# Patient Record
Sex: Female | Born: 1940 | Race: White | Hispanic: No | Smoking: Never smoker
Health system: Southern US, Community
[De-identification: ages and names within clinical notes are randomized; demographics above are authoritative.]

## PROBLEM LIST (undated history)

## (undated) DIAGNOSIS — G4730 Sleep apnea, unspecified: Secondary | ICD-10-CM

## (undated) DIAGNOSIS — M1990 Unspecified osteoarthritis, unspecified site: Secondary | ICD-10-CM

## (undated) DIAGNOSIS — I471 Supraventricular tachycardia, unspecified: Secondary | ICD-10-CM

## (undated) DIAGNOSIS — F419 Anxiety disorder, unspecified: Secondary | ICD-10-CM

## (undated) DIAGNOSIS — R0600 Dyspnea, unspecified: Secondary | ICD-10-CM

## (undated) DIAGNOSIS — Z8489 Family history of other specified conditions: Secondary | ICD-10-CM

## (undated) DIAGNOSIS — K219 Gastro-esophageal reflux disease without esophagitis: Secondary | ICD-10-CM

## (undated) DIAGNOSIS — C8590 Non-Hodgkin lymphoma, unspecified, unspecified site: Secondary | ICD-10-CM

## (undated) DIAGNOSIS — F329 Major depressive disorder, single episode, unspecified: Secondary | ICD-10-CM

## (undated) DIAGNOSIS — E7800 Pure hypercholesterolemia, unspecified: Secondary | ICD-10-CM

## (undated) DIAGNOSIS — M549 Dorsalgia, unspecified: Secondary | ICD-10-CM

## (undated) DIAGNOSIS — E669 Obesity, unspecified: Secondary | ICD-10-CM

## (undated) DIAGNOSIS — K5909 Other constipation: Secondary | ICD-10-CM

## (undated) DIAGNOSIS — F32A Depression, unspecified: Secondary | ICD-10-CM

## (undated) DIAGNOSIS — I639 Cerebral infarction, unspecified: Secondary | ICD-10-CM

## (undated) DIAGNOSIS — I10 Essential (primary) hypertension: Secondary | ICD-10-CM

## (undated) HISTORY — DX: Depression, unspecified: F32.A

## (undated) HISTORY — DX: Dorsalgia, unspecified: M54.9

## (undated) HISTORY — PX: BONE BIOPSY: SHX375

## (undated) HISTORY — DX: Other constipation: K59.09

## (undated) HISTORY — DX: Supraventricular tachycardia, unspecified: I47.10

## (undated) HISTORY — PX: REPLACEMENT TOTAL KNEE: SUR1224

## (undated) HISTORY — DX: Sleep apnea, unspecified: G47.30

## (undated) HISTORY — DX: Major depressive disorder, single episode, unspecified: F32.9

## (undated) HISTORY — PX: ESOPHAGOGASTRODUODENOSCOPY: SHX1529

## (undated) HISTORY — DX: Obesity, unspecified: E66.9

## (undated) HISTORY — DX: Essential (primary) hypertension: I10

## (undated) HISTORY — DX: Supraventricular tachycardia: I47.1

## (undated) HISTORY — PX: EYE SURGERY: SHX253

## (undated) HISTORY — DX: Anxiety disorder, unspecified: F41.9

## (undated) HISTORY — DX: Cerebral infarction, unspecified: I63.9

## (undated) HISTORY — DX: Gastro-esophageal reflux disease without esophagitis: K21.9

## (undated) HISTORY — DX: Unspecified osteoarthritis, unspecified site: M19.90

## (undated) HISTORY — DX: Pure hypercholesterolemia, unspecified: E78.00

## (undated) SURGERY — ESOPHAGOGASTRODUODENOSCOPY (EGD) WITH PROPOFOL
Anesthesia: Monitor Anesthesia Care

---

## 1971-08-03 HISTORY — PX: TOTAL ABDOMINAL HYSTERECTOMY: SHX209

## 1999-05-11 ENCOUNTER — Encounter: Admission: RE | Admit: 1999-05-11 | Discharge: 1999-05-22 | Payer: Self-pay | Admitting: *Deleted

## 1999-11-19 ENCOUNTER — Other Ambulatory Visit: Admission: RE | Admit: 1999-11-19 | Discharge: 1999-11-19 | Payer: Self-pay | Admitting: Internal Medicine

## 1999-11-23 ENCOUNTER — Encounter: Admission: RE | Admit: 1999-11-23 | Discharge: 1999-11-23 | Payer: Self-pay | Admitting: *Deleted

## 1999-11-23 ENCOUNTER — Encounter: Payer: Self-pay | Admitting: *Deleted

## 2001-10-26 ENCOUNTER — Encounter: Payer: Self-pay | Admitting: Orthopedic Surgery

## 2001-11-01 ENCOUNTER — Inpatient Hospital Stay (HOSPITAL_COMMUNITY): Admission: RE | Admit: 2001-11-01 | Discharge: 2001-11-04 | Payer: Self-pay | Admitting: Orthopedic Surgery

## 2002-09-08 ENCOUNTER — Emergency Department (HOSPITAL_COMMUNITY): Admission: EM | Admit: 2002-09-08 | Discharge: 2002-09-08 | Payer: Self-pay | Admitting: *Deleted

## 2007-06-12 ENCOUNTER — Other Ambulatory Visit: Admission: RE | Admit: 2007-06-12 | Discharge: 2007-06-12 | Payer: Self-pay | Admitting: Family Medicine

## 2007-08-03 HISTORY — PX: BACK SURGERY: SHX140

## 2007-10-10 ENCOUNTER — Emergency Department (HOSPITAL_COMMUNITY): Admission: EM | Admit: 2007-10-10 | Discharge: 2007-10-11 | Payer: Self-pay | Admitting: Emergency Medicine

## 2007-10-26 ENCOUNTER — Encounter: Admission: RE | Admit: 2007-10-26 | Discharge: 2007-10-26 | Payer: Self-pay | Admitting: Family Medicine

## 2007-11-01 ENCOUNTER — Ambulatory Visit (HOSPITAL_COMMUNITY): Admission: RE | Admit: 2007-11-01 | Discharge: 2007-11-01 | Payer: Self-pay | Admitting: Family Medicine

## 2007-11-01 ENCOUNTER — Encounter (INDEPENDENT_AMBULATORY_CARE_PROVIDER_SITE_OTHER): Payer: Self-pay | Admitting: Family Medicine

## 2007-11-15 ENCOUNTER — Encounter: Payer: Self-pay | Admitting: Interventional Radiology

## 2007-12-04 ENCOUNTER — Ambulatory Visit (HOSPITAL_COMMUNITY): Admission: RE | Admit: 2007-12-04 | Discharge: 2007-12-04 | Payer: Self-pay | Admitting: Interventional Radiology

## 2007-12-08 ENCOUNTER — Ambulatory Visit (HOSPITAL_COMMUNITY): Admission: RE | Admit: 2007-12-08 | Discharge: 2007-12-08 | Payer: Self-pay | Admitting: Interventional Radiology

## 2007-12-28 ENCOUNTER — Encounter: Admission: RE | Admit: 2007-12-28 | Discharge: 2007-12-28 | Payer: Self-pay | Admitting: Interventional Radiology

## 2007-12-29 ENCOUNTER — Ambulatory Visit (HOSPITAL_COMMUNITY): Admission: RE | Admit: 2007-12-29 | Discharge: 2007-12-29 | Payer: Self-pay | Admitting: Interventional Radiology

## 2007-12-30 ENCOUNTER — Encounter (INDEPENDENT_AMBULATORY_CARE_PROVIDER_SITE_OTHER): Payer: Self-pay | Admitting: Interventional Radiology

## 2007-12-30 ENCOUNTER — Ambulatory Visit: Payer: Self-pay | Admitting: Internal Medicine

## 2007-12-30 ENCOUNTER — Inpatient Hospital Stay (HOSPITAL_COMMUNITY): Admission: AD | Admit: 2007-12-30 | Discharge: 2008-01-02 | Payer: Self-pay | Admitting: Interventional Radiology

## 2008-01-24 ENCOUNTER — Encounter: Admission: RE | Admit: 2008-01-24 | Discharge: 2008-01-24 | Payer: Self-pay | Admitting: Interventional Radiology

## 2008-01-31 ENCOUNTER — Ambulatory Visit (HOSPITAL_COMMUNITY): Admission: RE | Admit: 2008-01-31 | Discharge: 2008-01-31 | Payer: Self-pay | Admitting: Interventional Radiology

## 2008-04-21 ENCOUNTER — Encounter: Admission: RE | Admit: 2008-04-21 | Discharge: 2008-04-21 | Payer: Self-pay | Admitting: Interventional Radiology

## 2008-04-22 ENCOUNTER — Encounter: Payer: Self-pay | Admitting: Interventional Radiology

## 2008-04-26 ENCOUNTER — Ambulatory Visit (HOSPITAL_COMMUNITY): Admission: RE | Admit: 2008-04-26 | Discharge: 2008-04-26 | Payer: Self-pay | Admitting: Interventional Radiology

## 2008-04-26 ENCOUNTER — Encounter (INDEPENDENT_AMBULATORY_CARE_PROVIDER_SITE_OTHER): Payer: Self-pay | Admitting: Interventional Radiology

## 2008-04-29 ENCOUNTER — Ambulatory Visit: Payer: Self-pay | Admitting: Infectious Diseases

## 2008-04-29 DIAGNOSIS — M899 Disorder of bone, unspecified: Secondary | ICD-10-CM | POA: Insufficient documentation

## 2008-04-29 DIAGNOSIS — M949 Disorder of cartilage, unspecified: Secondary | ICD-10-CM

## 2008-04-29 LAB — CONVERTED CEMR LAB
AST: 19 units/L (ref 0–37)
Albumin ELP: 46.4 % — ABNORMAL LOW (ref 55.8–66.1)
Albumin: 3.8 g/dL (ref 3.5–5.2)
Alkaline Phosphatase: 165 units/L — ABNORMAL HIGH (ref 39–117)
Alpha-2-Globulin: 13.7 % — ABNORMAL HIGH (ref 7.1–11.8)
BUN: 12 mg/dL (ref 6–23)
Basophils Absolute: 0.1 10*3/uL (ref 0.0–0.1)
Basophils Relative: 1 % (ref 0–1)
Beta Globulin: 6.9 % (ref 4.7–7.2)
Creatinine, Ser: 0.65 mg/dL (ref 0.40–1.20)
Eosinophils Absolute: 0.4 10*3/uL (ref 0.0–0.7)
Eosinophils Relative: 4 % (ref 0–5)
Glucose, Bld: 101 mg/dL — ABNORMAL HIGH (ref 70–99)
HCT: 43 % (ref 36.0–46.0)
Hemoglobin: 13 g/dL (ref 12.0–15.0)
MCHC: 30.2 g/dL (ref 30.0–36.0)
MCV: 90.5 fL (ref 78.0–100.0)
Monocytes Absolute: 0.9 10*3/uL (ref 0.1–1.0)
Monocytes Relative: 8 % (ref 3–12)
RBC: 4.75 M/uL (ref 3.87–5.11)
RDW: 13.3 % (ref 11.5–15.5)
Rhuematoid fact SerPl-aCnc: <20 intl units/mL (ref 0–20)
Total Protein, Serum Electrophoresis: 7.4 g/dL (ref 6.0–8.3)

## 2008-04-30 ENCOUNTER — Ambulatory Visit: Payer: Self-pay | Admitting: Infectious Diseases

## 2008-04-30 LAB — CONVERTED CEMR LAB
Albumin, U: DETECTED %
Alpha 1, Urine: DETECTED % — AB
Alpha 2, Urine: DETECTED % — AB
Free Kappa Lt Chains,Ur: 1.58 mg/dL — ABNORMAL HIGH (ref 0.04–1.51)

## 2008-05-07 ENCOUNTER — Ambulatory Visit: Payer: Self-pay | Admitting: Hematology

## 2008-05-10 ENCOUNTER — Encounter: Payer: Self-pay | Admitting: Oncology

## 2008-05-10 ENCOUNTER — Other Ambulatory Visit: Admission: RE | Admit: 2008-05-10 | Discharge: 2008-05-10 | Payer: Self-pay | Admitting: Oncology

## 2008-05-10 LAB — COMPREHENSIVE METABOLIC PANEL
AST: 18 U/L (ref 0–37)
Alkaline Phosphatase: 147 U/L — ABNORMAL HIGH (ref 39–117)
BUN: 15 mg/dL (ref 6–23)
Calcium: 9.2 mg/dL (ref 8.4–10.5)
Chloride: 98 mEq/L (ref 96–112)
Creatinine, Ser: 0.69 mg/dL (ref 0.40–1.20)

## 2008-05-10 LAB — CBC WITH DIFFERENTIAL/PLATELET
BASO%: 0.8 % (ref 0.0–2.0)
Basophils Absolute: 0.1 10*3/uL (ref 0.0–0.1)
EOS%: 4.8 % (ref 0.0–7.0)
HCT: 39.1 % (ref 34.8–46.6)
MCH: 29 pg (ref 26.0–34.0)
MCHC: 33.6 g/dL (ref 32.0–36.0)
MCV: 86.2 fL (ref 81.0–101.0)
MONO%: 5.9 % (ref 0.0–13.0)
NEUT%: 70.5 % (ref 39.6–76.8)
lymph#: 1.7 10*3/uL (ref 0.9–3.3)

## 2008-05-10 LAB — APTT: aPTT: 34 seconds (ref 24–37)

## 2008-05-10 LAB — MORPHOLOGY

## 2008-05-10 LAB — PROTHROMBIN TIME: Prothrombin Time: 12.9 seconds (ref 11.6–15.2)

## 2008-05-10 LAB — CHCC SMEAR

## 2008-05-16 ENCOUNTER — Ambulatory Visit (HOSPITAL_COMMUNITY): Admission: RE | Admit: 2008-05-16 | Discharge: 2008-05-16 | Payer: Self-pay | Admitting: Oncology

## 2008-05-21 ENCOUNTER — Ambulatory Visit (HOSPITAL_COMMUNITY): Admission: RE | Admit: 2008-05-21 | Discharge: 2008-05-21 | Payer: Self-pay | Admitting: Oncology

## 2008-05-23 ENCOUNTER — Telehealth: Payer: Self-pay | Admitting: Infectious Diseases

## 2008-05-27 ENCOUNTER — Ambulatory Visit (HOSPITAL_COMMUNITY): Admission: RE | Admit: 2008-05-27 | Discharge: 2008-05-27 | Payer: Self-pay | Admitting: Oncology

## 2008-05-28 ENCOUNTER — Encounter (INDEPENDENT_AMBULATORY_CARE_PROVIDER_SITE_OTHER): Payer: Self-pay | Admitting: Neurological Surgery

## 2008-05-28 ENCOUNTER — Inpatient Hospital Stay (HOSPITAL_COMMUNITY): Admission: RE | Admit: 2008-05-28 | Discharge: 2008-06-04 | Payer: Self-pay | Admitting: Neurological Surgery

## 2008-06-03 ENCOUNTER — Ambulatory Visit: Payer: Self-pay | Admitting: Physical Medicine & Rehabilitation

## 2008-06-04 ENCOUNTER — Ambulatory Visit: Payer: Self-pay | Admitting: Physical Medicine & Rehabilitation

## 2008-06-04 ENCOUNTER — Inpatient Hospital Stay (HOSPITAL_COMMUNITY)
Admission: RE | Admit: 2008-06-04 | Discharge: 2008-06-08 | Payer: Self-pay | Admitting: Physical Medicine & Rehabilitation

## 2008-06-08 ENCOUNTER — Inpatient Hospital Stay (HOSPITAL_COMMUNITY): Admission: EM | Admit: 2008-06-08 | Discharge: 2008-06-17 | Payer: Self-pay | Admitting: Internal Medicine

## 2008-06-10 ENCOUNTER — Encounter (INDEPENDENT_AMBULATORY_CARE_PROVIDER_SITE_OTHER): Payer: Self-pay | Admitting: Internal Medicine

## 2008-06-14 ENCOUNTER — Encounter (INDEPENDENT_AMBULATORY_CARE_PROVIDER_SITE_OTHER): Payer: Self-pay | Admitting: *Deleted

## 2008-06-17 ENCOUNTER — Inpatient Hospital Stay (HOSPITAL_COMMUNITY)
Admission: RE | Admit: 2008-06-17 | Discharge: 2008-06-24 | Payer: Self-pay | Admitting: Physical Medicine & Rehabilitation

## 2008-06-18 ENCOUNTER — Ambulatory Visit: Payer: Self-pay | Admitting: Oncology

## 2008-06-26 ENCOUNTER — Ambulatory Visit (HOSPITAL_COMMUNITY): Admission: RE | Admit: 2008-06-26 | Discharge: 2008-06-26 | Payer: Self-pay | Admitting: Oncology

## 2008-07-12 ENCOUNTER — Ambulatory Visit: Payer: Self-pay | Admitting: Oncology

## 2008-08-05 LAB — COMPREHENSIVE METABOLIC PANEL
AST: 14 U/L (ref 0–37)
Alkaline Phosphatase: 105 U/L (ref 39–117)
BUN: 12 mg/dL (ref 6–23)
Creatinine, Ser: 0.52 mg/dL (ref 0.40–1.20)
Total Bilirubin: 0.8 mg/dL (ref 0.3–1.2)

## 2008-08-05 LAB — CBC WITH DIFFERENTIAL/PLATELET
BASO%: 0.3 % (ref 0.0–2.0)
EOS%: 9.6 % — ABNORMAL HIGH (ref 0.0–7.0)
HCT: 32.5 % — ABNORMAL LOW (ref 34.8–46.6)
LYMPH%: 15.5 % (ref 14.0–48.0)
MCH: 27.6 pg (ref 26.0–34.0)
MCHC: 33.6 g/dL (ref 32.0–36.0)
NEUT%: 74.4 % (ref 39.6–76.8)
Platelets: 261 10*3/uL (ref 145–400)
lymph#: 0.9 10*3/uL (ref 0.9–3.3)

## 2008-08-26 ENCOUNTER — Ambulatory Visit: Payer: Self-pay | Admitting: Oncology

## 2008-08-28 LAB — CBC WITH DIFFERENTIAL/PLATELET
Basophils Absolute: 0 10*3/uL (ref 0.0–0.1)
EOS%: 1 % (ref 0.0–7.0)
HCT: 33.2 % — ABNORMAL LOW (ref 34.8–46.6)
HGB: 11.2 g/dL — ABNORMAL LOW (ref 11.6–15.9)
MCH: 28.9 pg (ref 26.0–34.0)
MCV: 85.3 fL (ref 81.0–101.0)
MONO%: 0.3 % (ref 0.0–13.0)
NEUT%: 79.5 % — ABNORMAL HIGH (ref 39.6–76.8)
lymph#: 1.1 10*3/uL (ref 0.9–3.3)

## 2008-08-28 LAB — COMPREHENSIVE METABOLIC PANEL
AST: 16 U/L (ref 0–37)
BUN: 17 mg/dL (ref 6–23)
Calcium: 9 mg/dL (ref 8.4–10.5)
Chloride: 101 mEq/L (ref 96–112)
Creatinine, Ser: 0.54 mg/dL (ref 0.40–1.20)
Glucose, Bld: 104 mg/dL — ABNORMAL HIGH (ref 70–99)

## 2008-08-28 LAB — MAGNESIUM: Magnesium: 1.9 mg/dL (ref 1.5–2.5)

## 2008-08-28 LAB — URIC ACID: Uric Acid, Serum: 5.9 mg/dL (ref 2.4–7.0)

## 2008-09-19 LAB — CBC WITH DIFFERENTIAL/PLATELET
Basophils Absolute: 0 10*3/uL (ref 0.0–0.1)
EOS%: 1.7 % (ref 0.0–7.0)
HCT: 39.4 % (ref 34.8–46.6)
HGB: 13.3 g/dL (ref 11.6–15.9)
MCH: 29.5 pg (ref 26.0–34.0)
MCV: 87.3 fL (ref 81.0–101.0)
MONO%: 0.1 % (ref 0.0–13.0)
NEUT%: 85.9 % — ABNORMAL HIGH (ref 39.6–76.8)

## 2008-09-19 LAB — URIC ACID: Uric Acid, Serum: 7.3 mg/dL — ABNORMAL HIGH (ref 2.4–7.0)

## 2008-09-19 LAB — COMPREHENSIVE METABOLIC PANEL
AST: 21 U/L (ref 0–37)
Alkaline Phosphatase: 87 U/L (ref 39–117)
BUN: 21 mg/dL (ref 6–23)
Calcium: 9.1 mg/dL (ref 8.4–10.5)
Chloride: 103 mEq/L (ref 96–112)
Creatinine, Ser: 0.56 mg/dL (ref 0.40–1.20)

## 2008-10-08 ENCOUNTER — Ambulatory Visit: Payer: Self-pay | Admitting: Oncology

## 2008-10-10 LAB — COMPREHENSIVE METABOLIC PANEL
ALT: 34 U/L (ref 0–35)
AST: 14 U/L (ref 0–37)
Alkaline Phosphatase: 82 U/L (ref 39–117)
Calcium: 8.9 mg/dL (ref 8.4–10.5)
Chloride: 104 mEq/L (ref 96–112)
Creatinine, Ser: 0.57 mg/dL (ref 0.40–1.20)
Total Bilirubin: 0.5 mg/dL (ref 0.3–1.2)

## 2008-10-10 LAB — CBC WITH DIFFERENTIAL/PLATELET
BASO%: 0 % (ref 0.0–2.0)
EOS%: 1.3 % (ref 0.0–7.0)
HCT: 33.8 % — ABNORMAL LOW (ref 34.8–46.6)
MCH: 30.2 pg (ref 25.1–34.0)
MCHC: 33.5 g/dL (ref 31.5–36.0)
NEUT%: 90.8 % — ABNORMAL HIGH (ref 38.4–76.8)
lymph#: 0.6 10*3/uL — ABNORMAL LOW (ref 0.9–3.3)

## 2008-11-18 LAB — CBC WITH DIFFERENTIAL/PLATELET
Eosinophils Absolute: 0.5 10*3/uL (ref 0.0–0.5)
MONO#: 0 10*3/uL — ABNORMAL LOW (ref 0.1–0.9)
NEUT#: 4.3 10*3/uL (ref 1.5–6.5)
RBC: 3.69 10*6/uL — ABNORMAL LOW (ref 3.70–5.45)
RDW: 14.7 % — ABNORMAL HIGH (ref 11.2–14.5)
WBC: 5.3 10*3/uL (ref 3.9–10.3)

## 2008-11-18 LAB — COMPREHENSIVE METABOLIC PANEL
Albumin: 3.5 g/dL (ref 3.5–5.2)
CO2: 27 mEq/L (ref 19–32)
Chloride: 104 mEq/L (ref 96–112)
Glucose, Bld: 156 mg/dL — ABNORMAL HIGH (ref 70–99)
Potassium: 3.6 mEq/L (ref 3.5–5.3)
Sodium: 142 mEq/L (ref 135–145)
Total Protein: 5.7 g/dL — ABNORMAL LOW (ref 6.0–8.3)

## 2008-11-18 LAB — URIC ACID: Uric Acid, Serum: 4.3 mg/dL (ref 2.4–7.0)

## 2008-11-18 LAB — MAGNESIUM: Magnesium: 1.8 mg/dL (ref 1.5–2.5)

## 2009-07-17 DIAGNOSIS — E785 Hyperlipidemia, unspecified: Secondary | ICD-10-CM | POA: Insufficient documentation

## 2009-07-17 DIAGNOSIS — J309 Allergic rhinitis, unspecified: Secondary | ICD-10-CM | POA: Insufficient documentation

## 2009-09-16 IMAGING — CR DG CHEST 1V PORT
1 series · 1 of 1 positions shown · non-contrast
Comparison: Chest x-ray of 06/04/2008

CLINICAL DATA: Shortness of breath, atrial fibrillation

PORTABLE CHEST - 1 VIEW

[AP]
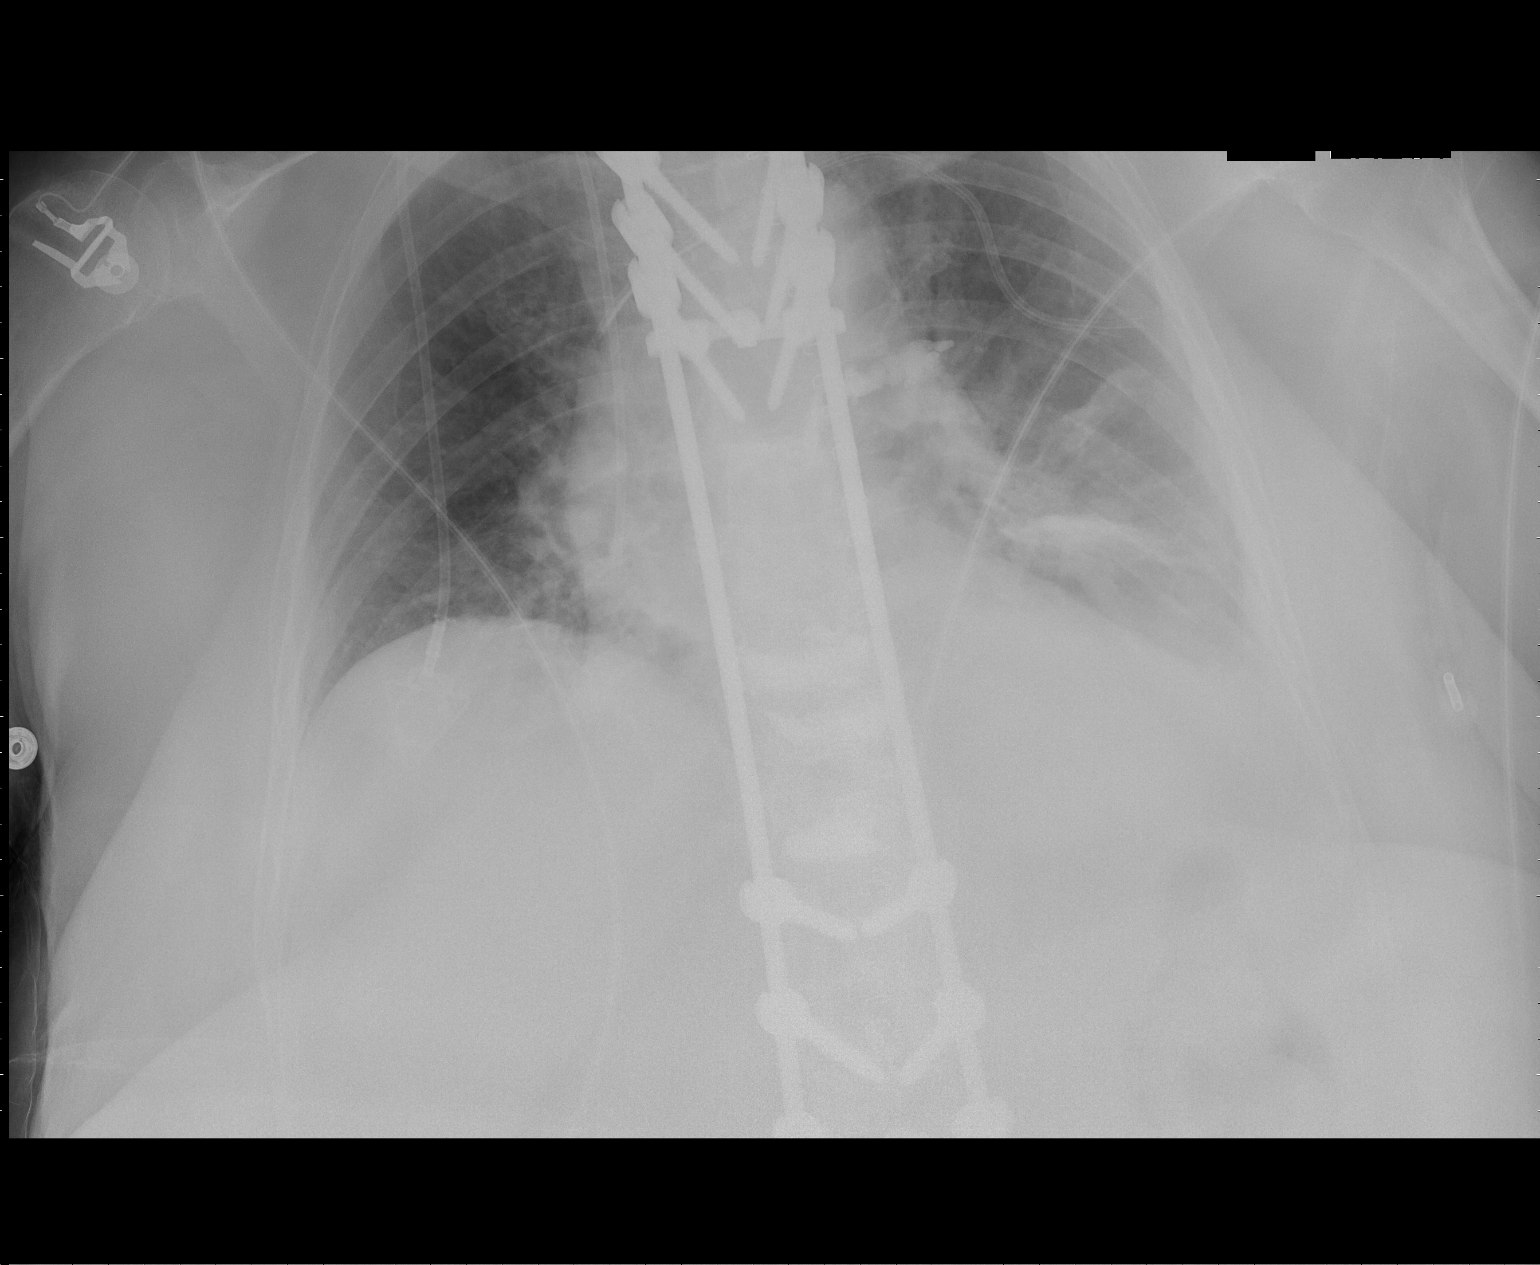

[1 of 1 positions shown; findings below may reference images not displayed]

FINDINGS: Linear atelectasis at the left lung base is stable.  The
right lung is clear.  Mild cardiomegaly is stable.  Central venous
lines are present with a new left central venous line now noted
with the tip in the mid SVC.  No pneumothorax is seen.  Harrington
rod fixation of the thoracic spine is present.
IMPRESSION: 1.  Stable linear atelectasis at the left lung base.
2.  New left central venous catheter tip inserted with tip in mid
SVC.  No pneumothorax.

## 2009-09-26 IMAGING — CR DG CHEST 2V
1 series · 1 of 1 positions shown · non-contrast
Comparison: 06/17/2008

CLINICAL DATA: Follow up infiltrate

CHEST - 2 VIEW

[w chest lat]
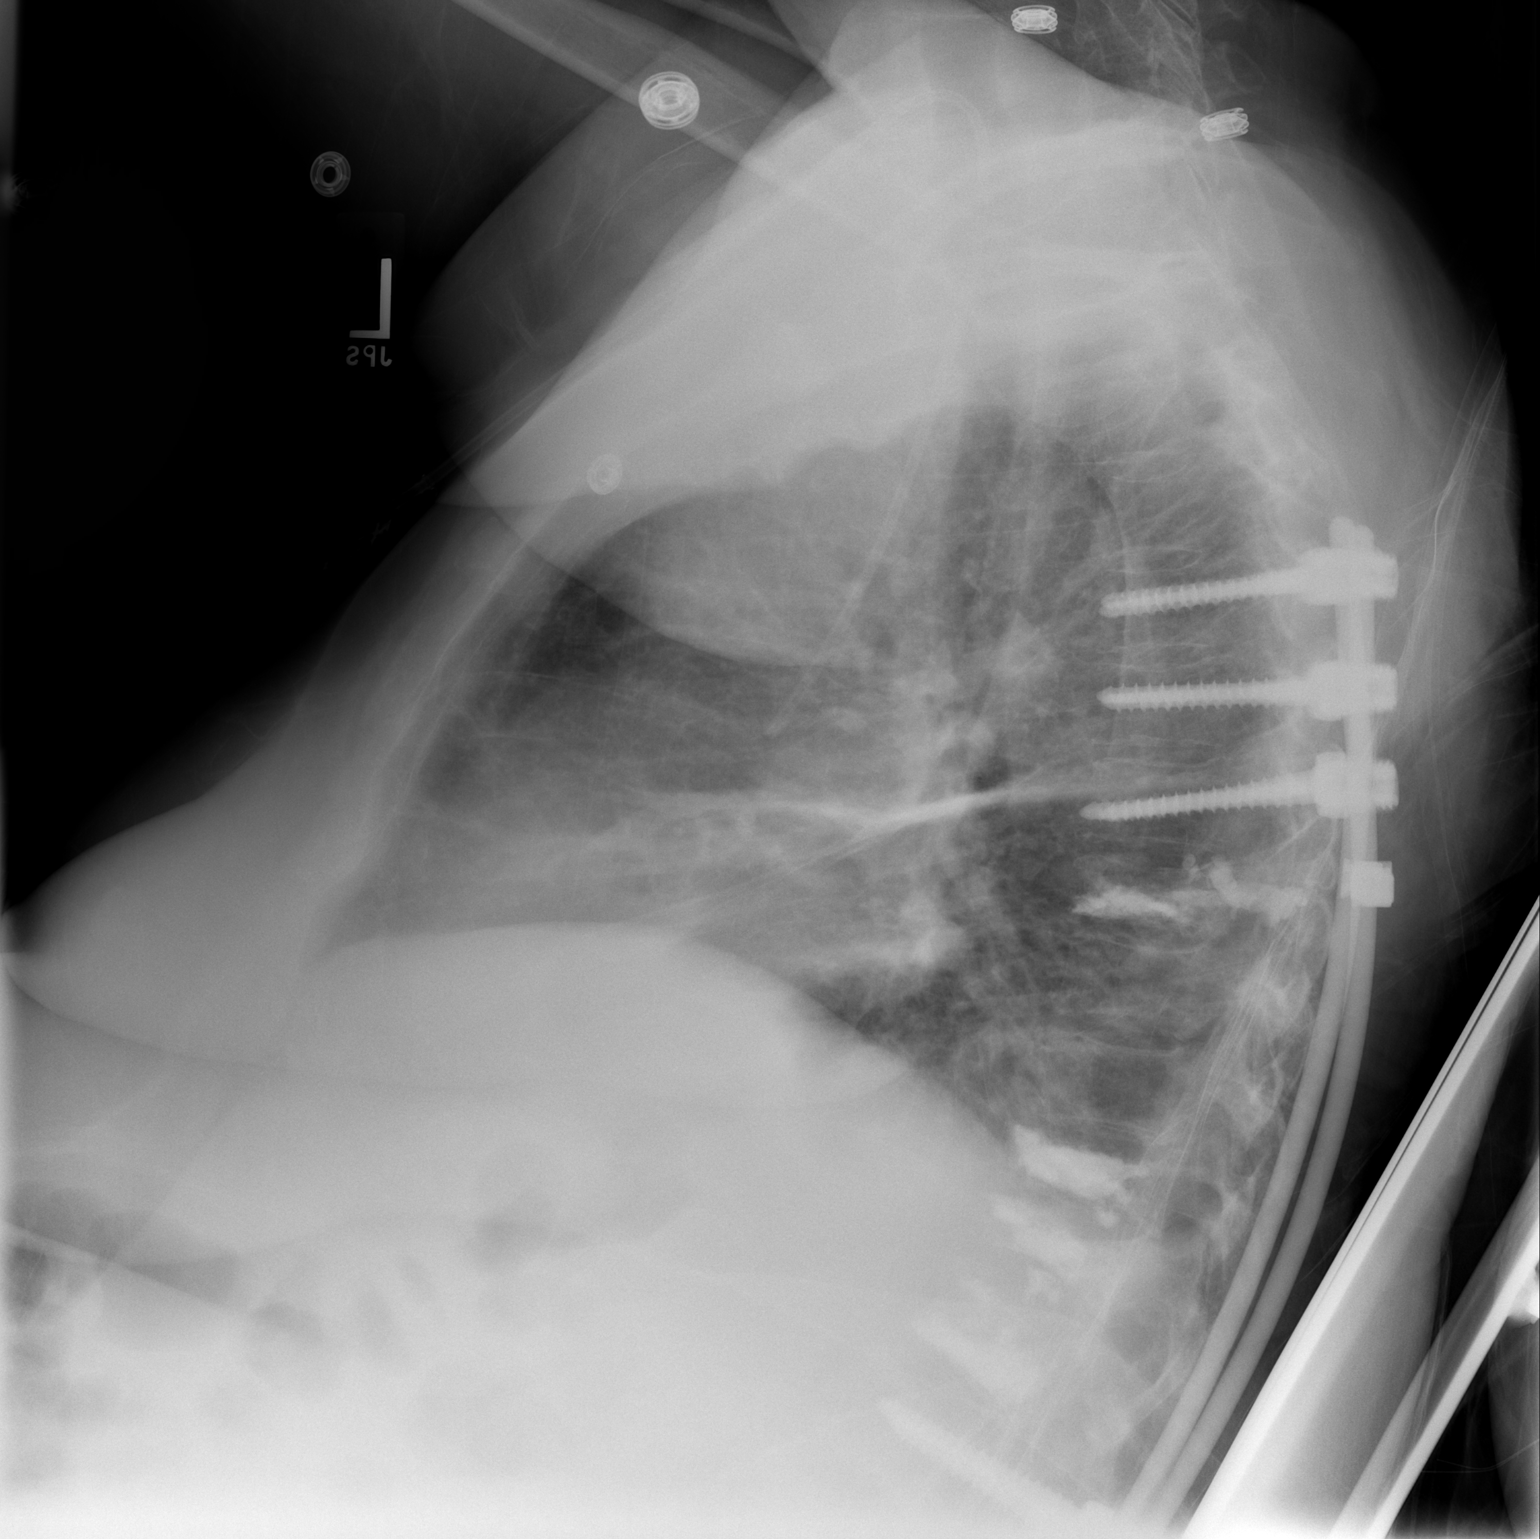

[1 of 1 positions shown; findings below may reference images not displayed]

FINDINGS: There is a right chest wall porta-catheter with tip in
the SVC.

Postoperative changes with thoracolumbar fusion hardware and
multiple vertebral plasty is again noted.

The left subclavian catheter has been removed.

There has been interval clearing of pulmonary edema

Persistent atelectasis and/or infiltrate within the lingular
portion of the left lung and left base.
IMPRESSION: 1.  Improved CHF.
2.  Residual densities at the left lung base.

## 2010-02-17 ENCOUNTER — Ambulatory Visit (HOSPITAL_COMMUNITY): Admission: RE | Admit: 2010-02-17 | Discharge: 2010-02-17 | Payer: Self-pay | Admitting: Gastroenterology

## 2010-05-04 ENCOUNTER — Ambulatory Visit (HOSPITAL_COMMUNITY): Admission: RE | Admit: 2010-05-04 | Discharge: 2010-05-04 | Payer: Self-pay | Admitting: Internal Medicine

## 2010-06-04 ENCOUNTER — Ambulatory Visit: Payer: Self-pay | Admitting: Cardiology

## 2010-07-20 DIAGNOSIS — I10 Essential (primary) hypertension: Secondary | ICD-10-CM | POA: Insufficient documentation

## 2010-08-23 ENCOUNTER — Encounter: Payer: Self-pay | Admitting: Family Medicine

## 2010-08-23 ENCOUNTER — Encounter: Payer: Self-pay | Admitting: Interventional Radiology

## 2010-10-15 LAB — CBC
HCT: 40.7 % (ref 36.0–46.0)
MCH: 30.9 pg (ref 26.0–34.0)
MCV: 90.4 fL (ref 78.0–100.0)
RBC: 4.5 MIL/uL (ref 3.87–5.11)
WBC: 7.6 10*3/uL (ref 4.0–10.5)

## 2010-11-14 ENCOUNTER — Other Ambulatory Visit: Payer: Self-pay | Admitting: Cardiology

## 2010-11-17 ENCOUNTER — Encounter: Payer: Self-pay | Admitting: Gastroenterology

## 2010-11-23 ENCOUNTER — Other Ambulatory Visit: Payer: Self-pay | Admitting: Cardiology

## 2010-11-23 DIAGNOSIS — I10 Essential (primary) hypertension: Secondary | ICD-10-CM

## 2010-11-25 ENCOUNTER — Telehealth: Payer: Self-pay | Admitting: *Deleted

## 2010-11-25 ENCOUNTER — Encounter: Payer: Self-pay | Admitting: Gastroenterology

## 2010-11-25 ENCOUNTER — Other Ambulatory Visit: Payer: Self-pay | Admitting: *Deleted

## 2010-11-26 NOTE — Telephone Encounter (Signed)
I have reviewed Echart, Centricity and EPIC and cannot locate any documentation that this pt has seen Dr Riley Kill.  The only thing I can locate is a Consult note from Dr Deborah Chalk in 2009.  This pt has not been seen in our office for an appointment.  We need to deny prescription and the pharmacy can forward to pt's PCP.

## 2010-12-15 NOTE — Discharge Summary (Signed)
[name], [name]                  ACCOUNT NO.:  [id number]   MEDICAL RECORD NO.:  [id number]          PATIENT TYPE:  IPS   LOCATION:  4007                         FACILITY:  [facility]   PHYSICIAN:  Ranelle Oyster, M.D.DATE OF BIRTH:  1941/04/03   DATE OF ADMISSION:  06/04/2008  DATE OF DISCHARGE:  06/08/2008                               DISCHARGE SUMMARY   DISCHARGE DIAGNOSES:  1. Tachycardia, question atrial fibrillation.  2. Hypertension.  3. 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  4. Chronic constipation issues.   HISTORY OF PRESENT ILLNESS:  [name] is 70 year old female with history  of hypertension, T10 compression fracture with kyphoplasty in April 2009  with worsening of pain and workup in May 2009 revealing interval  development of abnormality at T8-T12 with extension of compression  fractures to areas of T9 and T11 with question of tumor versus  infection.  Wound biopsies done, showed benign fibrosis of connective  tissue.  The patient developed pathological fracture of T7 with low back  pain and leg pain and weakness of left lower extremity.  X-rays done  recently showed T7-L1 involvement with pathological fracture at T1 and  T12 with cord compression secondary T7 fracture.  She has had CT abdomen  and chest done in the past that have shown borderline adenopathy in  periaortic and left peritoneal area with question of lymphoma.  Hem/Onc  is awaiting full biopsy results to decide on treatment option.  Currently, a Port-A-Cath is in place.   The patient was admitted on May 28, 2008, for bone biopsy of T7, T8,  T9, T11, and T12 with kyphoplasty at T7, T11, and T12 with decompression  at T7 via laminectomy and T4-L3 arthrodesis by Dr. Danielle Dess.  Bone biopsy  and flow cytometry without monoclonal B cell population or abnormal T  cell phenotype.  Currently, therapies are initiated and the patient is  noted to have issues with the pain  with dizziness being a limiting  factor.  She is able to do some standing.  Rehab was consulted for  further therapies.   Past medical history is significant for:  1. Hypertension.  2. Depression.  3. Chronic pain.  4. Chronic constipation.  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  6. A T10 compression fracture with kyphoplasty in April 2009 extension      to T9-T11 compression fracture in May 2009.  7. Gallstones.  8. Port-A-Cath placement, October 2009.  9. Asthma.  10.A history of pneumonia in September 2009.  11.Morbid obesity.   ALLERGIES:  No known drug allergies.   Family history is positive for cancer.   SOCIAL HISTORY:  The patient is married, lives in 1-level home with ramp  at entry.  Does not use any tobacco or alcohol.  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   FUNCTIONAL HISTORY:  The patient was independent with walker prior to  admission.  Has not been driving for the past few months.  Has needed  some assist p.r.n.  basis.   PHYSICAL EXAMINATION:  GENERAL:  [name] is an obese 70 year old  female, alert and oriented x3.  Appears to be in moderate distress due  to pain.  HEENT:  Pupils equal, round, and reactive to light.  Nares patent.  Tongue midline.  Moist mucosa.  Fair dentition.  NECK:  Supple without JVD or lymphadenopathy.  CHEST:  Clear to auscultation bilaterally with some scattered crackles  at bases.  HEART:  Regular and rate rhythm without murmurs or gallops.  ABDOMEN:  Soft with nonspecific tenderness.  Active bowel sounds noted.  EXTREMITIES:  No edema with left lower extremity weakness.  No cyanosis  or clubbing.  NEUROLOGIC:  Cranial nerves II-XII are intact.  Reflexes 2+.  She has  decreased sensation of bilateral legs.  Strength is improved in lower  extremities to 2/5 proximally, 3 plus to 4/5 distally.  Upper extremity  strength is 4-4 plus/5 with pain inhibition.  Reflexes are 1+.  Judgment   orientation, memory are within functional limits.  Mood is flat, a bit  anxious secondary to pain issues.   HOSPITAL COURSE:  [name] was admitted to rehab on June 04, 2008, for inpatient therapies to consist of PT and OT at least 3 hours 5  days a week.  Past admission, physiatry, rehab, RN, and therapy team  have worked together to provide customized collaborative  interdisciplinary care during her stay.  Secondary to question of left  lower lobe pneumonia, a followup chest x-ray was done past admission.  This revealed probable discoid left lung base atelectasis, less likely  an infection.  A KUB was also done secondary to her complaints of  nonspecific tenderness.  This showed normal bowel gas pattern and  cholelithiasis.  The patient with reports of constipation issues with no  BM for at least a week.  Aggressive bowel program was initiated by  nursing with addition of Senokot-S as well as Amitiza and a soapsuds  enema.  The patient did require disimpaction x2 to help resolution of  symptoms.  Additionally, the patient's blood pressures were monitored on  b.i.d. basis with orthostatic blood pressures ordered due to question of  dizziness with mobility.  The patient was noted to have some issues with  dizziness, specially with transfers to the edge of the bed.  Thigh-high  TEDs, as well as an abdominal binder, was ordered to help with the  patient's symptomatology.  Labs were done past admission revealing  hypokalemia with potassium at 3.3 and this was supplemented.  Her check  of CBC revealed hemoglobin 9.6, hematocrit 29.4, white count 10.6,  platelets 505.  UA/UC was done.  UA was positive; however, urine culture  showed 100,000 colonies of multi species.  Repeat check of lytes were  done to monitor the patient's hypokalemia.  Labs of June 07, 2008,  showed some improvement in potassium at 3.4, sodium at 140, chloride  103, CO2 28, BUN 5, creatinine 0.6, glucose 101.   The patient was  started back on her OxyContin CR at 10 mg b.i.d. at the time of  admission.  Pain has been a limiting factor, albeit increase in back  pain, especially with increase in mobility.  Her OxyContin CR was  increased to 20 mg q.12 h, OxyIR was additionally increased to 15 mg q.4-  6 h.  The patient had complaints of right shoulder trap pain.  Lidocaine  patch was added to this area to help with symptomatology.  At  the time  of admission, the patient was noted to require total assist for bed  mobility, required min assist for transfers, min assist for ambulating  20 feet with decreased cadence.  OT eval showed the patient had max  assist for bathing, total assist for lower body dressing, total assist  for donning TLSO.  OT has worked with nursing to help the patient get  premedicated prior to a.m. therapies to help improve her participation  in therapy.  OT has also worked with the patient in the use of assistive  equipment to help with lower body care, as well as emphasis of back  precautions with self-care.  The patient's therapy progress was limited  with the patient developing a tachycardia on June 08, 2008.  Heart  rate was noted to be up to 170s with the patient noted to be in AFib.  The patient was transferred off to telemetry for closer monitoring with  cardiac workup and/or other workup as needed.  On June 08, 2008, the  patient was discharged to acute services.   Medications at the time of discharge include:  1. Albuterol nebs q.i.d.  2. Symbicort 1 puff b.i.d.  3. Cymbalta 60 mg a day.  4. Hydrochlorothiazide 12.5 mg a day.  5. Prinivil 20 mg a day.  6. Protonix 40 mg a day.  7. Senokot-S 1 p.o. nightly.  8. Amitiza 24 mcg b.i.d.  9. Multivitamin 1 per day.  10.Valium 5 mg q.i.d. p.r.n. anxiety.  11.Os-Cal plus D b.i.d.  12.OxyContin CR 20 mg q.12 h.  13.OxyIR 15 mg q.4 h. p.r.n. pain.  14.Lidocaine patch to right shoulder blade, on 7:00 a.m., off  7:00      p.m.   Diet is regular, thin liquid.   Activity level, currently limited due tachycardia, further input  regarding activity and medication changes per primary team.      Greg Cutter, P.A.      Ranelle Oyster, M.D.  Electronically Signed    PP/MEDQ  D:  06/21/2008  T:  06/21/2008  Job:  [id number]   cc:   Stefani Dama, M.D.  Robert L. Foy Guadalajara, M.D.

## 2010-12-15 NOTE — Consult Note (Signed)
NAMEADDLEY, BALLINGER                  ACCOUNT NO.:  [id number]   MEDICAL RECORD NO.:  [id number]          PATIENT TYPE:  INP   LOCATION:  2918                         FACILITY:  [facility]   PHYSICIAN:  Colleen Can. Deborah Chalk, M.D.DATE OF BIRTH:  [birth year]   DATE OF CONSULTATION:  06/12/2008  DATE OF DISCHARGE:                                 CONSULTATION   Thank you much for asking [place] to see Ms. Heims for evaluation of abnormal  2D echocardiogram with questionable mass related to the mitral valve  leaflet.  She presents from rehab with a history of tachycardia and  intermittent atrial fib flutter.  She has had significant spinal surgery  including open bone biopsies and a questionable lymphoma with a  diagnosis.  She did well postop until June 08, 2008, when she  developed a tachycardia.  It spontaneously resolved.  Followup 2D  echocardiogram showed a questionable mass.   PAST MEDICAL HISTORY:  Morbid obesity, gallstones, history of asthma.  She has open bone biopsies on T7-8, T9-11, and T12 include kyphoplasty  at T7, T11, T12, and decompression of T7.  She has had depression,  anemia, hypertension, and chronic pain syndrome.   ALLERGIES:  None.   CURRENT MEDICATIONS:  Amitiza 24 mg b.i.d., Synacol, Cymbalta, Protonix,  lidocaine patch, multivitamin, Os-Cal, Lovenox, Xopenex, oxycodone,  Reglan, vancomycin, Zosyn, K-Dur, Colace, and IV Cardizem.   FAMILY HISTORY:  Mother died at 42 with breast cancer, lymphoma, and  DVTs.  Father died of a stroke.   SOCIAL HISTORY:  No tobacco.  No alcohol.   REVIEW OF SYSTEMS:  As noted.   PHYSICAL EXAMINATION:  GENERAL:  She is in no acute distress.  She  appeared be initially over sedated.  VITAL SIGNS:  Blood pressure is 117/56, heart rate is in the 70s to  100s, O2 sats 94%.  SKIN:  Warm and dry.  Color is normal.  LUNGS:  Few wheezes.  HEART:  Tachycardia.  ABDOMEN:  Obese.  EXTREMITIES:  Full.   PERTINENT LABS:  Hematocrit is 25,  platelets are 612,000, white count is  9000.  Chemistries showed potassium of 3.5, sodium 135, CO2 of 27, BUN  4, and creatinine 0.4.  Troponins are negative.  CK totals are low with  MB fraction of 9.8, 10.1, and 8.8 with questionable significant.  BNP is  84.  D-dimer is 19.89.  Chest x-ray shows edema versus pneumonia with  left lower lobe atelectasis.  EKG shows sinus tachycardia.  There is  questionable SVT versus atrial flutter with a 2:1.   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   OVERALL IMPRESSION:  1. Abnormal 2D echocardiogram.  2. Underlying malignancy.  3. History of hypertension.  4. Obesity.  5. Asthma.  6. Pneumonia.  7. Anemia.  8. Previous spinal surgery.   PLAN:  She will need a TEE for followup of this questionable mass.  We  will need to try to control her any atrial arrhythmias with a  combination of beta-blocker at a low dose to prevent exacerbation of  asthma and also the consideration of  digoxin.  I think that some of  issues will be related to underlying pathology.  Certainly, need to be  concerned about possibility of pulmonary emboli with her being sedentary  and not mobile.  She did have a CT angio of the chest on June 09, 2008, which showed no pulmonary emboli.      Colleen Can. Deborah Chalk, M.D.  Electronically Signed     SNT/MEDQ  D:  06/13/2008  T:  06/13/2008  Job:  [id number]

## 2010-12-15 NOTE — Consult Note (Signed)
[name], [name]                  ACCOUNT NO.:  [id number]   MEDICAL RECORD NO.:  [id number]           PATIENT TYPE:   LOCATION:                                 FACILITY:   PHYSICIAN:  Sanjeev K. Deveshwar, M.D.DATE OF BIRTH:  11-02-1940   DATE OF CONSULTATION:  01/31/2008  DATE OF DISCHARGE:                                 CONSULTATION   BRIEF HISTORY:  This is a very pleasant 70 year old female who was  admitted to [facility], Dec 30, 2007, to January 02, 2008, by Dr.  Rito Ehrlich for evaluation of back pain.  The patient does have a history  of compression fractures.  She had an MRI on October 26, 2007, that  revealed a compression fracture at T10 with an 80% loss of height.  On  November 01, 2007, she had a vertebroplasty performed by Dr. Corliss Skains at  the T10 level along with a deep core bone biopsy.  The biopsy results  showed benign bone and fibrous connective tissue.  There was no atypia  or monomorphic infiltrates identified.   The patient was seen in followup by Dr. Corliss Skains on November 15, 2007.  At  that time it was felt that her pain had improved significantly.  The  patient was told to contact us further, if she had any subsequent pain.   In May 2009, the patient developed further back pain.  She had a repeat  MRI of the thoracic and lumbar spine with and without contrast.  On Dec 04, 2007, this showed marked interval change with abnormality from T8-T12  with mild cord compression.  The lumbar MRI showed a tiny area of  enhancement in the right aspect of the thecal sac at the L2-L3 level.  The etiology or significance was indeterminate.  Further followup was  recommended.  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   The patient was seen again in followup by Dr. Corliss Skains on Dec 08, 2007,  she was having increased pain at that time.  Dr. Corliss Skains was not  certain  of the etiology of the changes from T8-T12.  He felt that this  possibly might be a reaction related to her previous vertebroplasty or  possibly some type of infectious process.  The patient was scheduled to  return for further evaluation in 2 weeks.  She was given a prescription  for Darvocet for pain at that time.   The patient ended up being admitted to [facility] on Dec 30, 2007, by Dr. Rito Ehrlich for further evaluation of her back pain which had  become quite severe and infectious disease consult was obtained from Dr.  Sampson Goon during that visit.  The etiology of the pain, any abnormality  on MRI was still unclear.  The patient was treated with OxyContin.  Cultures and pathology reports were still inconclusive.  Dr. Sampson Goon  felt that this was atypical for infection and thought that it possibly  might be an inflammatory response.  The patient was discharged from the  hospital on January 02, 2008.  She did have a portable chest x-ray on January 01, 2008, that showed no acute cardiopulmonary disease, although, there  was a probable left base scar with atelectasis and a more nodular  component inferior and laterally. Followup was recommended in 2-3 months  to confirm the stability.  On Dec 30, 2007, the patient underwent a  fluoro-guided needle biopsy at the T9-T11 levels performed by Dr.  Corliss Skains.  The T9 level showed necrosis and foci of crushed artifact.  The T11 level showed blood in small bone fragments associated with  crushed artifact.  There was no diagnostic malignancy identified,  although there was extensive necrosis along with crush artifact within  the connective tissue.   An MRI of the thoracic and lumbar spine was repeated on January 24, 2008,  that showed persistent lower thoracic abnormalities involving T8 and T12  with decreased epidural enhancement from T8-T10.  There is progression  of the posterior lateral paraspinal soft tissue abnormality,  particularly on  the right at the T12 to L1 level and on the left at T8.  Once again percutaneous sampling and biopsy of these lesions was to be  considered.  The patient was also noted to have a new left pleural  effusion and left lower lobe medial pulmonary process was felt this  could possibly represent pneumonia or atelectasis.  A followup was  recommended for this as well.  There is evolution of the T11 compression  fracture with mild retropulsion and mass effect on the spinal cord  similar to that previously seen at the T10 level.  The T10 level  appeared to be stable, status post vertebroplasty.  There was interval  resolve posterior epidural enhancement at T12 to L1 and the lumbar spine  otherwise appeared to be stable except for some increased posterior  lateral paraspinal soft tissue inflammation and enhancement included in  the left erector spinae muscle at L1.  This was contiguous with  abnormalities described in the thoracic impression above.   The patient returns today to be seen in followup by Dr. Corliss Skains.  She  is accompanied by her 3 daughters.  The patient reports that she has had  ongoing pain.  She has been taking OxyContin since being discharged from  the hospital for her pain which does provide some relief.  Dr. Corliss Skains  did review the results of the previous MRI studies with the patient and  her daughters.  He pointed out the areas of concern.  He did feel that  the most recent MRI was questionably improved, although he felt further  evaluation was indicated.   The patient has been scheduled for a CT scan of the chest today to  further evaluate a pleural effusion as noted above.  She recently had  labs at her primary care physician's office Dr. Foy Guadalajara, we had these  faxed over.  Her BUN 9.  Her creatinine was 0.7.  Her potassium was 4.8  and she will undergo CT scan of the chest with and without contrast  today for further evaluation of the left pleural effusion.  Dr.   Corliss Skains recommended repeating an MRI in 6 weeks'.  She may also need a  further biopsy.  The patient will be seen in followup by Dr. Corliss Skains  in 6 weeks' after her MRI as repeated.  He also recommended further  followup with Dr. Sampson Goon and continued followup with her primary  care  physician, Dr. Foy Guadalajara.   The patient has been taking OxyContin for pain.  She is almost out of  this prescription.  Dr. Corliss Skains did not feel comfortable giving her  new prescription.  The patient was told this medication could be very  habit-forming.  Dr. Corliss Skains recommended that she try to taper and  discontinue the medication.   PAST MEDICAL HISTORY:  Please see the above history regarding her spinal  abnormalities.  She also has a history of hypertension, history of  depression, history of chronic pain, and history of chronic  constipation.   ALLERGIES:  No known drug allergies.   CURRENT MEDICATIONS:  The patient did not bring a list of her  medications, although she is taking OxyContin for pain as noted.   SOCIAL HISTORY:  The patient is married.  She does not use alcohol or  tobacco.  She has never been a smoker, but she has been exposed to  secondhand smoke.  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  She and her  husband live in [place].   FAMILY HISTORY:  Her mother died at age 74 from lymphoma and breast  cancer.  Her father died at age 61 from arteriosclerosis.  She has no  siblings, 5 daughters are alive and well.   SURGICAL HISTORY:  The patient has had previous knee replacement  surgery.   PLAN:  As noted, the patient will have a CT scan of her chest today to  further evaluate her left pleural effusion.  She will follow up with Dr.  Corliss Skains with an MRI in 6 weeks' as well as a followup visit.  She may  need further biopsies.  Dr. Corliss Skains will discuss the case with Dr.  Sampson Goon.  Greater than 40 minutes was spent on this consult.      Delton See, P.A.     ______________________________  Grandville Silos. Corliss Skains, M.D.    DR/MEDQ  D:  01/31/2008  T:  02/01/2008  Job:  [id number]   cc:   Molly Maduro L. Foy Guadalajara, M.D.  Mick Sell, MD  Hollice Espy, M.D.

## 2010-12-15 NOTE — Op Note (Signed)
[name]                  ACCOUNT NO.:  [id number]   MEDICAL RECORD NO.:  [id number]          PATIENT TYPE:  INP   LOCATION:  3106                         FACILITY:  [facility]   PHYSICIAN:  Stefani Dama, M.D.  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   PROCEDURE:  Open bone biopsies of T7, T8, T9, T11, and C12, kyphoplasty,  acrylic balloon kyphoplasty of T7, T11, and T12, decompression of spinal  cord at T7 via laminectomy and transpedicular approach to the ventral  spine, segmental fixation at T4-L3 with posterior arthrodesis with  allograft.   SURGEON:  Stefani Dama, MD   FIRST ASSISTANT:  Clydene Fake, MD.   ANESTHESIA:  General endotracheal.   INDICATIONS:  [name] is a 70 year old individual who initially  presented with a pathologic fracture of T10.  Biopsy was nondiagnostic  at that time and she was observed and developed further disease with  subsequent involvement noted at T8-T9 above and T11-T12 below.  She then  developed a pathologic fracture of T7 and started to complain of back  pain and leg pain and developed weakness in the left lower extremity.  She now has involvement from T7 to L1 with new pathologic fractures of  T1 and T12 in addition to the cord compression that is present secondary  to the pathologic fracture at T7.  The patient is to undergo surgical  decompression and biopsies in addition to stabilization procedure.  The  patient was brought to the operating room supine on the stretcher.  After the placement of appropriate central monitoring lines and arterial  blood line and  Foley catheter, she was placed under general anesthesia  and turned prone.  Back was then prepped with alcohol and DuraPrep,  draped in sterile fashion.  A midline incision was carried down to the  thoracodorsal fascia, which was opened on either side of midline to  expose the thoracic spine in the middle area.  Ultimately, the  dissection was carried cephalad to expose T4 inferiorly to expose down  to L3.  Self-retaining retractors were placed in the wound.  The  dissection was done carefully to minimize blood loss.  At the level of  T7 where there was the worst area of compression, a transpedicular  needle was placed on the left side to obtain bone marrow biopsy and  there was a solid bone core from the T7 vertebra.  This was sent as a  singular specimen.  Subsequently, transpedicular approach was used to  biopsy T8 and T9, then T11 and T12.  Once the biopsies were obtained,  attention was turned to acrylic balloon kyphoplasty and this was  performed at T11-T12 where there was  a moderate degree of compression  and also at T7 on the ventral aspect of the vertebral body to gain some  anterior support.  The T7 kyphoplasty was noted to have some material  extruded self off to the left side.  This appeared to be in the  subcostal space.  At this point, it was decided to proceed with the  stabilization portion and pedicle entry sites were chosen  fluoroscopically at the T4, T5, and T6 vertebrae bilaterally placing a  5.5 x 45 or 50 mm screws in each of the vertebrae as required by length.  Each of the holes were sounded and checked for cutout and were  individually tapped.  Then attention was turned to the lower portion of  her fixation and pedicle screws were placed bilaterally at L1, L2, and  L3.  This was again done with fluoroscopic guidance using a  transpedicular approach and placing 6.5 x 45 or 6.5 x 50 mm screws into  the vertebrae as required per side.  Once these screws were all  placed,  then decompression was performed by removing the laminar arch of T7,  carefully exposing the dura on the left lateral side.  Transpedicular  approach to the ventral aspect of the vertebral body was performed, here  some bone was found to be bulging posteriorly.  This was tamped down and  several fragments were removed.  Nonetheless, the bulk of the vertebra  was allowed to remain intact.  Once decompression was obtained and  hemostasis was achieved, I then decorticated the lateral gutters and the  posterior arches from T4 down to L3.  Rods were then contoured to fit  from T4-L3 pedicle screws in capture of screws adequately to maintain a  neutral construct.  Rods were then cut, bent appropriately and fitted  and secured with the caps.  The system was then torqued down.  The  lateral gutters that had been decorticated were then filled with bone  marrow material in the form of Vitoss bone packs.  A total of 30 mL of  the Vitoss bone packs were used in the posterior lateral gutters.  Once  the area was secured, hemostasis was achieved and care was taken to make  sure that the dura was well decompressed and this area was inspected  thoroughly, then allowed for closure of the wound with #1 Vicryl in the  thoracodorsal and lumbodorsal fascia.  Three 2-0 Vicryl in the  subcutaneous tissues, 3-0 Vicryl subcuticularly and finally surgical  staples on the skin.  Procedure lasted a total of nearly 6 hours.  Blood  loss was estimated at 1000 mL.  The patient received 2 units of packed  cells in the operating room to be maintained in the intensive care unit  as transported there stable.      Stefani Dama, M.D.  Electronically Signed     HJE/MEDQ  D:  05/28/2008  T:  05/29/2008  Job:  [id number]

## 2010-12-15 NOTE — Consult Note (Signed)
[name], METZNER                  ACCOUNT NO.:  [id number]   MEDICAL RECORD NO.:  [id number]          PATIENT TYPE:  IPS   LOCATION:  4005                         FACILITY:  [facility]   PHYSICIAN:  Jethro Bolus, MD            DATE OF BIRTH:  Dec 05, 1940   DATE OF CONSULTATION:  06/18/2008  DATE OF DISCHARGE:                                 CONSULTATION   REFERRING PHYSICIAN:  Dr. Faith Rogue.   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 Burkitt's).   HPI:  [name] is a 70 year old Caucasian woman with history of low back  pain since April of 2009.  She underwent T10 compression fracture  vertebroplasty on November 01, 2007, with pathology consistent with benign  fibrous connective tissue without atypia or malignant infiltrate  identified.  Her back pain resolved until April 12, 2008, when her  lower back pain recurred.  She underwent a followup MRI on April 21, 2008, that showed edema in previously involved T8 to T12 levels.  There  was also new edema involving T7-L1 level with interval loss of body  height in levels T7-T12.  The old compression fracture on T10 appeared  to have progressed.   She underwent a fine-needle biopsy of L1 vertebral body on April 26, 2008, with pathology concerning for B-cell lymphoproliferative disorder  that was positive for CD79A, CD20, CD10 and negative for CD3, CD5, CD34,  CD43, and CD138.  She was referred to me for evaluation given concern  for B-cell lymphoproliferative process and underwent on May 10, 2008,  a bone marrow biopsy which showed no evidence of monoclonal B-cell  process.  She underwent a staging chest, abdomen, pelvic CT on May 16, 2008, which showed no gross changes in the appearance of the  thoracic spine or lumbar spine.  However, in the abdominal area, there  were 1.6 x 1.8cm gastrohepatic  ligament lymph node,  1.3 x 2.1cm celiac  lymph node, 1.3 x 1.6cm periaortic lymph node, and 0.9 x 1.2 cm  aortocaval lymph node.   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  She was evaluated by Dr. Barnett Abu with  neurosurgical service for decompressive procedure given the fact that  she was developing progressive debilitating lower back pain, bilateral  lower extremity weakness and paresthesia.  Therefore, she underwent on  May 28, 2008, open bone biopsy of T7, 8, 9, 11, and C12 and  kyphoplasty.  She also had acrylic balloon kyphoplasty of T7, T11, T12.  She had decompression of spinal cord at T7 via laminectomy and  transpedicular approach to the ventral spine with submental fixation at  T4 and L3 with posterior arthrodesis with allograft.  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  However, the rest of the other sample  specimens did not show disease in T6, T7, T8, T9, T11, and T12.  Immunohistochemistry was performed on the L1-L2 biopsy which was  positive for CD20, CD79A, CD10, and increase in Ki-67 and was negative  for T-cell marker, CD3, CD5,c -kit, myeloperoxidase and CD34 were  negative.   I received a formal report from the pathologist, Dr. Francoise Ceo, on  June 06, 2008, and given the concern for atypical presentation of the  patient I requested a [facility] Pathology consultation where I  received a call from Dr. Ricki Miller on June 17, 2008, relaying her concern  of morphologically Burkitt lymphoma.  However, the confirmatory FISH  study for c-myc translocation is pending.   INTERVAL HISTORY:  [name] has undergone  PT/OT after her surgery on  May 28, 2008.  However, this past week she had a few setbacks with  supraventricular tachycardia.  She also developed shortness of breath  with a CT angio negative for pulmonary embolism.  However, she was  diagnosed with bilateral bibasilar infiltrate concerning for either  community-acquired pneumonia or aspiration pneumonia.  Since then, she  has improved from the pulmonary standpoint and is on room air.  She is  seen in her room today with her 5 daughters and a son-in-law.  She  reports that she has been having moderate back pain where the  laminectomy took place.  However, she denies knowledge of any skin  breakdown or purulent discharge from her neurosurgical wound.  She  reports right shoulder pain with unknown etiology without any skin  breakdown.  From the rehab standpoint, she has been able to ambulate  with the use of a walker approximately 20 feet in addition to stand at a  sink and take shower herself.  She has anorexia and an unquantified  weight loss  for the past 2 months.  She denies night sweat, fever,  headaches, visual changes, palpable lymph nodes, swelling.  She has some  nausea without frank vomiting.  She denies cough, shortness of breath  now, hemoptysis, hematuria, hematemesis.  She does have some mild  shortness of breath upon ambulation.  She reports some mild bilateral  pelvic tenderness but it is intermittent.  She denies diarrhea,  constipation, hematuria.  She denies skin rashes or lower extremity bone  pain.   PAST MEDICAL HISTORY:  1. Hypertension.  2. History of asthma/reactive airway disease requiring home nebs      machine.  She denies history of asthma or COPD exacerbations      requiring home O2 or chronic inhalers or intubation in the past.  3. History of chronic constipation, chronic pain, and depression.  4. Supraventricular tachycardia as noted above.   She denies history of diabetes, hyperlipidemia, CAD, CVA,  CHF, seizure,  or clotting disorder.   CURRENT INPATIENT MEDICATIONS:  1. Augmentin 875 mg p.o. b.i.d.  2. Calcium with vitamin D.  3. Digoxin 0.125 mg p.o. daily.  4. Doxycycline 100 mg p.o. b.i.d.  5. Cymbalta 60 mg p.o. daily.  6. Iron sulfate 325 mg p.o. b.i.d.  7. Heparin subcu t.i.d.  8. Atrovent every 6 hours p.r.n.  9. Xopenex every 6 hours p.r.n.  10.Lisinopril 10 mg p.o. daily.  11.Lopressor 50 mg p.o. daily.  12.Pantoprazole 40 mg p.o. every 12 hours.  13.Oxycodone p.r.n. pain every 12 hours.   ALLERGIES:  NO KNOWN DRUG  ALLERGIES.   SOCIAL HISTORY:  Ms. Weyland is married and [place]  She denied history of smoking, alcohol, or  IV drug use.  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  Her 5 daughters have all been very supportive of Ms. Mccabe.   FAMILY HISTORY:  Notable for mother with unknown type non-Hodgkin  lymphoma.  Her mother also had a history of breast cancer when she was  34 years old.  Father deceased, age 44, from CAD and CVA.  She is an  only child.  She has 5 daughters who are all alive and well.  Maternal  grandmother had cervical cancer.  Maternal grandmother had cervical  cancer.  Paternal grandparents both had CVA.  Maternal aunt with breast  cancer in her 1s and another maternal aunt with unknown type of cancer.   PHYSICAL EXAM:  VITAL SIGNS:  Her temperature is 98.1 Fahrenheit.  Heart  rate of 88.  Respiratory rate of 18.  Blood pressure 150/80.  O2 sat of  93% on room air.  GENERAL:  Mildly obese woman, no acute distress, however, depressed  appearing.  Oropharynx was dry.  Mucous membranes without mucosal  lesion.  NECK:  Supple.  LYMPH NODE EXAM:  Negative for cervical, supraclavicular, axillary  adenopathy.  LUNGS:  Clear bilaterally without wheezing or crackle.  CARDIAC EXAM:  Regular rate and rhythm.  S1 and S2 without murmur, rub,  or gallop.  ABDOMEN:  Soft, flat,  nontender, nondistended without organomegaly.  There was no pedal edema.  MUSCULOSKELETAL EXAM:  Showed no palpable crepitus or skin rash or  tenderness of palpation of the right shoulder joint.  Her laminectomy  scar is well healed.   CURRENT LABORATORY:  WBC 8.8, ANC 74%, hemoglobin 9.5, MCV 86, platelet  count  582.  Creatinine 0.6, AST 19, ALT of 9, total protein 5.5, albumin 1.9,  calcium 8.6.   She did have SPEP and free light chains sent and showed no evidence of M-  spike or evidence of kappa lambda monoclonality during this admission.  Her iron studies showed iron of 15, percent sat of 160, folate 17,  ferritin of 670, B12 of 404.   DIAGNOSIS:  1. Multilevel vertebral compression fracture  2. 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 Decreased performance status 2/2 recent compression fracture and      recent surgery  4. Recent nosocomial pneumonia  5. Supraventricular tachycardia   ASSESSMENT AND PLAN:  It is my impression that Ms. Batul Diego is a 34-  year-old woman with no significant past medical history with progressive  vertebral spine compressive fracture with the last procedure done on  May 28, 2008.  Biopsy of L1-L2 lesions concerning for Burkitt  lymphoma morphologically.  At this time, her FISH analysis for  confirmatory c-myc translocation is still pending.  If she turns out to  have Burkitt's lymphoma, this would be extranodal Burkitt's lymphoma  with borderline retroperioteneal LAD on CT.  Staging has been notable  for negative bone marrow biopsy; LDH normal at 242 U/L (on 05/10/08).  She has portaCath in place; and EF 57% in 10/09.   I have discussed with Ms. Stiner and her daughters that Burkitt lymphoma  is an aggressive lymphoma that is normally not curable with traditional  chemotherapy using CHOP-Rituxan regiman.  She would require aggressive  chemotherapy regimen such as hyper-CVAD.  The final diagnosis will be  available over  the next few days.  At  this time, I encouraged patient to  increase her p.o. intake and physical therapy/occupational therapy  effort to prepare for any need for intensive chemotherapy in the near  future.  When her final FISH is resulted, I will contact the patient and  her family members to discuss my final recommendation of transferring to  a tertiary care center for aggressive chemotherapy for Burkitt if that  turns out to be the case.  On the other hand, if she turns out to have  diffuse large B-cell lymphoma or another aggressive lymphoma, not  otherwise specified, then I will recommend patient undergoing aggressive  physical therapy/occupational therapy and will be started upon  chemotherapy upon discharge.   The patient and her family members expressing informed consenting and  will let me know in the next few days where they would like the patient  to be transferred to if she has Burkitt lymphoma.      Jethro Bolus, MD  Electronically Signed     HH/MEDQ  D:  06/18/2008  T:  06/18/2008  Job:  [id number]

## 2010-12-15 NOTE — H&P (Signed)
[name], [name]                  ACCOUNT NO.:  [id number]   MEDICAL RECORD NO.:  [id number]          PATIENT TYPE:  IPS   LOCATION:  NA                           FACILITY:  [facility]   PHYSICIAN:  Ellwood Dense, M.D.   DATE OF BIRTH:  06-22-1941   DATE OF ADMISSION:  06/17/2008  DATE OF DISCHARGE:                              HISTORY & PHYSICAL   PRIMARY CARE PHYSICIAN:  Dr. Foy Guadalajara.   NEUROSURGEON:  Dr. Danielle Dess.   ONCOLOGIST:  Dr. Gaylyn Rong.   CARDIOLOGIST:  Dr. Deborah Chalk.   HISTORY OF PRESENT ILLNESS:  Ms. Kotas is a 70 year old Caucasian female  with history of hypertension and multiple compression fractures with  prior vertebroplasty and needle biopsies without evidence of malignancy.   The patient continued to experience back pain and MRI scan showed  interval development of abnormalities at T8-T12 with extension of  compression fracture T8-T11 with questionable tumor versus infection.  She developed a pathological fracture at T7 with low back pain and  weakness of the bilateral lower extremities.  X-rays recently showed a  pathological fracture at T1 and T12 with cord compression at T7  secondary to fracture.   The patient was admitted May 28, 2008, for bone biopsy at T7, T8,  T9, T11 and T12 with kyphoplasty at T7, T11 and T12.  She also had a  decompression of the cord at T7 with fixation T4-L3 by Dr. Danielle Dess.  The  patient had history of abdominal and chest CT which showed borderline  adenopathy and Dr. Gaylyn Rong has been following.  Pathology from surgery showed  non-Hodgkin's B-cell lymphoma.   The patient had issues regarding the hypertension and pain management.  She initially came to the rehabilitation June 04, 2008, for ongoing  therapy.  She was participating in therapies, but on June 09, 2008,  the patient developed tachycardia with heart rate in the 170s secondary  to supraventricular tachycardia/flutter.  She was transferred to step-  down for closer monitoring and  treatment.  CT angiography of the chest  showed no pulmonary embolism, although there was bilateral lower lobe  atelectasis as well as right middle lobe and lingula atelectasis.  She  was started on IV antibiotics for leukocytosis and pneumonia June 10, 2008.  A 2-D echocardiogram showed questionable mass and a  transesophageal echocardiogram June 14, 2008, showed ejection  fraction of 60% with increased aortic valve thickness without mass or  vegetation.  Dr. Deborah Chalk has been following, and a beta blocker was  added for control of heart wave with conversion from atrial fibrillation  to normal sinus rhythm.  Therapies have been reinitiated, and the  patient has problems with bed mobility and ambulation.  She is being  transferred back to rehab for continued therapies at this point.   The patient was evaluated by the rehabilitation physicians and felt be  an appropriate candidate for inpatient rehabilitation.   REVIEW OF SYSTEMS:  Positive for weakness and wound healing.   PAST MEDICAL HISTORY:  1. Hypertension.  2. Depression.  3. Chronic pain.  4. Constipation.  5. Obesity.  6. Gallstones.  7. Port-A-Cath.   FAMILY HISTORY:  Positive for cancer.   SOCIAL HISTORY:  The patient is married and lives with her husband in a  one level home with no steps to enter.  Husband is reportedly in good  health.  There are five daughters who mostly all live close by and can  help as needed.  The patient does not use alcohol or tobacco.   FUNCTIONAL HISTORY:  Prior to admission, independent with a rolling  walker.   ALLERGIES:  NO KNOWN DRUG ALLERGIES.   MEDICATIONS:  Prior to admission:  1. Prilosec over-the-counter 20 mg daily.  2. Diazepam 5 mg daily.  3. Celebrex 200 mg daily (Prinizide 20/12.51 one tablet daily).  4. Evista 60 mg daily.  5. Amitiza 24 mcg b.i.d.  6. Os-Cal with vitamin D daily.  7. Multivitamin daily.  8. OxyContin CR 10 mg q.12 h.   LABORATORY:  Recent  hemoglobin was 13.6 with hematocrit of 41.9,  platelet count of 976,000 and white count of 12.5.  Recent sodium was  145, potassium 3.5, chloride 104, bicarbonate 33, BUN 2 and creatinine  0.58 with glucose of 90.  BNP was 84, measured June 12, 2008.  Hemoglobin A1c was 5.7.  Chest x-ray June 17, 2008, showed bilateral  lung densities improved but not yet resolved.  Recent reticulocyte count  was 1.9% with iron of 15, TIBC of 160 and vitamin B12 404.   PHYSICAL EXAMINATION:  GENERAL:  Morbidly obese adult female lying in  bed in mild to no acute discomfort.  VITAL SIGNS:  Blood pressure 153/78 with pulse 78, straight 18 and  temperature 98.1.  HEENT:  Normocephalic, nontraumatic.  CARDIOVASCULAR:  Regular rate and rhythm.  S1-S2 without murmurs.  ABDOMEN:  Soft, obese, nontender with positive bowel sounds.  LUNGS:  Clear to auscultation bilaterally.  NEUROLOGIC:  Alert and oriented x3.  Cranial nerves II-XII are intact.  Tongue was in the midline and facial symmetry was intact.  BACK:  Showed  well-healing wound without significant drainage.  Bilateral upper  extremity exam showed 4/5 strength throughout.  Sensation was intact to  light touch throughout the bilateral upper extremities.  Sensation was  intact to light touch throughout the bilateral upper extremities.  Lower  extremity exam showed hip flexion, knee extension and ankle dorsiflexion  at 4-/5 bilaterally.   DIAGNOSES:  1. 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  Status post T7, T11 an T12      kyphoplasty with decompression at T7 and fixation T4-L3 May 28, 2008, by Dr. Danielle Dess.  2. Lung atelectasis on antibiotic Dec 06, 2012 with recheck of chest x-      ray.  3. Atrial fibrillation presently on Lanoxin and beta-blocker for heart      rate control, presently in normal sinus rhythm.  4. Acute blood loss anemia with ongoing iron deficiency.  5.  Reported diarrhea/stools presently on Amitiza.  6. Pain control, presently on OxyContin CR 10 mg q.12 h with p.r.n.      Vicodin for breakthrough pain.  7. Non-Hodgkin's lymphoma awaiting input and Dr. Gaylyn Rong.  8. Hypertension, presently on Prinivil at a low dose.   Presently, the patient will be admitted to receive collaborative and  interdisciplinary care between the physiatrist, rehab nursing staff and  therapy team at least 3 hours per day at least 5 days per  week.  The  patient's level of medical complexity and substantial therapy needs in  context that medical necessity cannot be provided at a lesser intensity  of care.  Physiatrist will provide 24-hour management of medical needs  as well as oversight of the therapy plans/treatment and provide guidance  as appropriate regarding interaction of the two.  A 24-hour rehab  nursing will assist in management of bowel and bladder and help  integrate therapy concepts, techniques and education.  Physical therapy  will assess and treat for range of motion, strengthening, bed mobility,  transfers, pregait training, gait training and equipment evaluation.  Occupational therapy will assess and treat for range of motion,  strengthening, ADLs, cognitive slash perceptional training, splinting  and equipment evaluation.  Case management social work will assess and  treat for psychosocial issues and discharge planning as appropriate.  Team conferences will be held weekly to establish goals, assess progress  and determine barriers to discharge.   PROGNOSIS:  Fair/good.   ESTIMATED LENGTH OF STAY:  Ten to 20 days.   GOALS:  Standby assist to min assist ADLs, transfers and ambulation.           ______________________________  Ellwood Dense, M.D.     DC/MEDQ  D:  06/17/2008  T:  06/17/2008  Job:  [id number]

## 2010-12-15 NOTE — H&P (Signed)
NAMELESLEA, VOWLES                  ACCOUNT NO.:  [id number]   MEDICAL RECORD NO.:  [id number]          PATIENT TYPE:  INP   LOCATION:  2314                         FACILITY:  [facility]   PHYSICIAN:  Kela Millin, M.D.DATE OF BIRTH:  06-08-1941   DATE OF ADMISSION:  06/08/2008  DATE OF DISCHARGE:                              HISTORY & PHYSICAL   CHIEF COMPLAINT:  Tachycardia to 170s per rehab physician.   HISTORY OF PRESENT ILLNESS:  The patient is a 70 year old obese white  female who was discharged from acute care to the [facility] inpatient rehab  unit on June 04, 2008 following surgery per Dr. 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  Per rehab physician the patient has done well since  transfer to the 4000 unit on June 04, 2008 still today when nursing  staff reported that she was having tachycardia up to the 170s.  The  patient denies chest pain and states that she is not so sure why she had  to be transferred.  She admits to dyspnea on exertion but states that  she has had that for a while and it is unchanged.  She reports that she  had a cough a few weeks ago but was treated for pneumonia at that time  by her primary care physician and has not had any further cough.  She  denies fevers, dysuria, diarrhea, melena, and no hematochezia.  Lab work  done today revealed a white cell count of 12.8, hemoglobin of 10.8, and  her potassium was 3.4 and otherwise chemistries unremarkable.  An EKG  was done on the rehab unit and it showed an SVT with a rate of 172, and  it  was noted per rapid response nurse that the patient would  intermittently convert to sinus tach in the low 100s and back again to  the SVT/flutter.  She was transferred to the step-down unit for further  evaluation and management.   PAST MEDICAL HISTORY:  1. As above.  2. History of constipation secondary to medications.  3. History of gallstones.  4. History of morbid obesity.  5. History of asthma.   MEDICATIONS AT REHAB:  1. Symbicort 1 puff b.i.d.  2. Ventolin 2.5 mg nebs q.i.d.  3. Hydrochlorothiazide 12.5 mg daily.  4. Protonix 40 mg daily.  5. Prinivil 20 mg daily.  6. Cymbalta 60 mg daily.  7. Senokot 2 p.o. q.h.s.  8. Amitiza 24 mcg b.i.d.  9. Lovenox 50 mg subcu daily.  10.Lidocaine patches to right shoulder.  11.Oxycodone XR 20 mg q.12h.  12.Valium 5 mg p.o. p.r.n.  13.Zofran p.r.n.   ALLERGIES:  NKDA.   SOCIAL HISTORY:  She denies  tobacco.  She also denies alcohol.   FAMILY HISTORY:  Her mother had breast cancer, lymphoma, and she died of  an MI at age 81.  She also had DVTs.  Her dad had a stroke.   REVIEW OF SYSTEMS:  As per HPI, other review of systems negative.   PHYSICAL EXAM:  GENERAL:  The patient is an obese elderly white female,  she is alert and appropriate, in no apparent distress.  VITAL SIGNS: Temperature is 98.5, pulse 177, initially improved to 105  (on Cardizem IV), respiratory rate 25, O2 sat 97%.  HEENT: PERRL, EOMI, slightly dry mucous membranes.  No oral exudates.  NECK: Supple, no adenopathy, no thyromegaly and no JVD.  LUNGS:  Decreased breath sounds at bases, no crackles and no wheezes.  CARDIOVASCULAR:  Tachycardiac, regular.  ABDOMEN:  Soft, bowel sounds present, nontender, nondistended.  No  organomegaly and no masses palpable.  EXTREMITIES:  No cyanosis and no edema.   LABORATORY DATA:  Sodium is 137, potassium 3.4, chloride is 100, CO2 of  29, BUN is 6 with a creatinine of 0.54, glucose is 128.  White cell  count is 12.8,  hemoglobin 10.8, hematocrit 32.6, platelet count 705, and  CK total is 35, MB is 9.8.  Troponin is 0.01. EKG - supraventricular  tachycardia rate of 172.   ASSESSMENT AND PLAN:  1. 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  She denies chest      pain and no change in her dyspnea on exertion. And it is noted that      she has been on scheduled bronchodilators due to her history of      asthma.  The patient started on IV Cardizem, will obtain serial      cardiac enzymes, 2-D echocardiogram. Also will place the patient on      therapeutic dose Lovenox and transfer to step-down unit.  Will      obtain a D-dimer follow and consider CT angiogram of chest to      further evaluate pending those results.  2. History of hypertension - monitor blood pressures on Cardizem and      manage accordingly.  3. History of asthma - will change bronchodilators to Xopenex and      follow.  4. History of chronic pain - continue pain management.  5. History of depression - continue Cymbalta.  6. History of constipation - secondary to pain medications, continue      Amitiza and Senokot.  7. History of compression fractures/metastatic tumor/?lymphoma -      status post surgery as stated above on      October 27 per Dr. Danielle Dess, and her oncologist is Dr. Margo Aye.  Follow      and consult as appropriate.  Continue pain management.  8. Hypokalemia - mild, replace potassium.      Kela Millin, M.D.  Electronically Signed     ACV/MEDQ  D:  06/09/2008  T:  06/09/2008  Job:  [id number]   cc:   Molly Maduro A. Nicholos Johns, M.D.  Stefani Dama, M.D.

## 2010-12-15 NOTE — Discharge Summary (Signed)
NAMELEXXIE, WINBERG                  ACCOUNT NO.:  [id number]   MEDICAL RECORD NO.:  [id number]          PATIENT TYPE:  INP   LOCATION:  3007                         FACILITY:  [facility]   PHYSICIAN:  Hollice Espy, M.D.DATE OF BIRTH:  13-May-1941   DATE OF ADMISSION:  12/30/2007  DATE OF DISCHARGE:  01/02/2008                               DISCHARGE SUMMARY   PRIMARY CARE [place]:  Dr. Dewaine Oats.   CONSULTANTS:  Dr. Corliss Skains, interventional radiology.  Dr. Mick Sell, infectious disease.   DISCHARGE DIAGNOSES:  1. Extension of compression fracture to areas of T9 and T11.  Etiology      at this time is not known, possible tumor versus infection.  2. T10 compression fracture status post vertebroplasty.  3. History of hypertension.  4. History of depression.  5. History of chronic pain.  6. History of constipation secondary to pain medication.   DISCHARGE MEDICATIONS:  New medications:  1. OxyContin 10 mg SR 1 p.o. b.i.d. scheduled.  2. OxyIR 5 mg every 4 hours p.r.n. for breakthrough pain.  The patient      is going to get a prescription for 60 of these.   FOLLOWUP APPOINTMENT:  The patient follow with PCP, Dr. Dewaine Oats this  coming Friday January 05, 2008 for followup as well as for reading of her  PPD.  She will follow up with infectious disease, Dr. Sampson Goon, in 1  month.   MEDICATIONS:  She will also resume her chronic medications as follows:  1. Evista 60 p.o. daily.  2. Multivitamin p.o. daily.  3. Amitiza 25 p.o. b.i.d.  4. Indapamide 2.5 p.o. b.i.d.  5. Stool softener 1 to 2 p.o. daily.  6. Celebrex 200 p.o. daily p.r.n.  7. Darvocet N 100 one p.o. q.4-6 h p.r.n.  8. Valium 5 mg 1 to 2 tablets p.o. daily p.r.n.  9. Percocet 5/225 one to two p.o. q.4-6 hours p.r.n.  10.Cymbalta 50 mg p.o. daily.  11.Os-Cal D 1 p.o. b.i.d.  12.K-Dur 10 mg p.o. daily   HOSPITAL COURSE:  The patient is a 70 year old white female who, 2  months ago underwent a T10 compression  fracture vertebroplasty.  Since  that time she has continued to have lower back pain.  She had followup  MRI done at the beginning of May and that showed some extension of  signal into areas above and below one to T9 and T11.  This was  concerning for the possibility of tumor versus infection, specifically  TB.  It was felt best that the patient come in for further evaluation.  The patient was brought in on Dec 30, 2007 which she went a biopsy of T9  and T11 levels.  Infectious disease followed up with the patient and  they felt that likely this was not from infection but to continue to  monitor off of antibiotics.  She remained afebrile.  In Addition, her  white count remained completely normal.  Over the next several days, her  gram stains for the sample sent returned back and showed some few white  blood cells present but no organisms and AFB cultures came back  negative.  The patient's hospital course was unremarkable.  She had some  episodes of pain requiring pain control and then on January 01, 2008 the  patient's pathology came back noting at both sites blood and bone  fragments with extensive necrosis but no signs of any tumor cells, with  minimal peripheral white cells, but no signs of any infectious etiology.  After discussion with the patient and with infectious disease, it is  felt that this could be vertebral osteomyelitis versus TB, versus  noninfectious.  Cultures and pathology at this time is inconclusive.  Dr. Sampson Goon thought this was very atypical for infection and he  favors inflammatory response.  He suggested close outpatient followup  off of antibiotics and if she continues to progress may repeat biopsies.  In regards to ruling out tumor, I discussed this with the patient.  A  PPD is being placed which will be read by her PCP in 3 days this coming  Friday June 5.  In the interim, we will plan to put the patient on  increased pain medication OxyContin 10 mg SR twice a  day plus 5 mg IR  for breakthrough pain.  Then, she is also advised on bowel regimen  taking over-the-counter laxative daily if she does not have bowel  movement daily.  Home health physical therapy to assist her with some  ambulatory status.  In regards to ruling out tumor, I spoke briefly with  Dr. Fatima Sanger associate and likely until a better diagnosis is made  and tumors excluded, he hesitant to do further vertebroplasty on the  patient on extended compression fractures.  What we will do is planned  for in the interim prior when the patient will follow up with PCP on  Friday.  At that time we will try to obtain information as to whether  PET scan will benefit the patient to completely rule out tumor.  If the  PET scan would be appropriate when will have her PCP set up plans for  PET scan.  If the tumor is excluded by PET scan then the patient can  follow up Dr. Corliss Skains for vertebroplasty repair.  She will also follow  with Dr. Sampson Goon in one month's time.  If her PPD positive,  she will  follow up with Dr. Sampson Goon sooner than that.  The patient's overall  disposition is improved.  She is being discharged to home.  Activity  will be per home health PT.  She is discharged on low-sodium diet.  The  only other note of this patient's hospital course where she had an  episode of hypokalemia while on IV fluids.  Her potassium was replaced  and she is doing well.      Hollice Espy, M.D.  Electronically Signed     SKK/MEDQ  D:  01/02/2008  T:  01/02/2008  Job:  [id number]   cc:   Molly Maduro L. Foy [place], M.D.  Mick Sell, MD  [place] Silos. Corliss Skains, M.D.

## 2010-12-15 NOTE — Discharge Summary (Signed)
[name], [name]                  ACCOUNT NO.:  [id number]   MEDICAL RECORD NO.:  [id number]          PATIENT TYPE:  INP   LOCATION:  3010                         FACILITY:  [facility]   PHYSICIAN:  Stefani Dama, M.D.  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   SECONDARY DIAGNOSES:  Hypertension, morbid obesity, asthma, chronic  pain, and esophageal reflux.   DISCHARGE DIAGNOSIS:  Hypertension, morbid obesity, asthma, chronic  pain, and esophageal reflux with acute blood loss anemia  postoperatively.   OPERATIONS AND PROCEDURES:  Bone biopsy of T7, T8, T9, T11, and T12;  kyphoplasty of T11, T12, and T7; decompression of spinal cord at T7 via  laminectomy with pedicle screw fixation and posterior spinal fusion with  segmental fixation at T4-L3; posterior arthrodesis and allograft.   HOSPITAL COURSE:  The patient underwent surgical intervention on May 28, 2008, with above procedure.  She tolerated procedure well,  stabilized in the recovery room, and placed in the Neurosurgical ICU  postoperatively.  On first day postoperatively, the patient was resting  comfortably.  She had decrease in her leg pain.  She had improved  ability to move her leg.  She was afebrile and vital signs were stable.  Hematocrit was 34.  Started with  the physical therapy and occupational  therapy.  Dr. Gaylyn Rong was consulted for treatment of her lymphoma.  Her diet  was advanced and she continued to make slow progress.  On the second day  postoperatively, she was working with Physical Therapy to get out of  bed, dressing was changed on her lumbar spine, slowly advanced her diet.  She continued to make slow progress, continued to work with Physical  Therapy and Occupational Therapy.  Rehab consult was taken  for  comprehensive inpatient rehabilitation.  Her PCA morphine pump was  discontinued on June 02, 2008 to p.o. pain meds.  She continued to  mobilize.  Her wound remained benign.  There is no erythema, drainage,  or signs of infection.  She was ready for discharge on June 04, 2008,  to Rehab.  She was eating well.  Her Foley catheter was discontinued.  She was able to void.  Her wound was benign.  There was no focal  deficit.  Her vital signs were stable.  She was afebrile.  She was  comfortable on p.o. pain meds.  She had a TLSO brace for support.  She  was ready for transfer to Rehab on August 05, 2007, for continued  physical therapy, occupational therapy, and comprehensive inpatient  rehabilitation.   DISCHARGE CONDITION:  Stable, improved.   DISCHARGE INSTRUCTIONS:  Discharge to rehab.  Follow up with Dr. Danielle Dess  in 4-6 weeks as an outpatient.  Continue to follow up with Dr. Gaylyn Rong  regarding treatment of her lymphoma.  X-rays were obtained prior to  transfer for  comprehensive inpatient  rehabilitation and her fusion construct was  stable, good position and alignment on AP and lateral films.  Contact  our office prior to followup with questions and concerns.  I will  continue to follow her during her hospitalization.      Aura Fey Bobbe Medico.      Stefani Dama, M.D.  Electronically Signed    SCI/MEDQ  D:  08/15/2008  T:  08/15/2008  Job:  [id number]

## 2010-12-15 NOTE — H&P (Signed)
NAMEVENNA, [name]                  ACCOUNT NO.:  [id number]   MEDICAL RECORD NO.:  [id number]          PATIENT TYPE:  OIB   LOCATION:  3007                         FACILITY:  [facility]   PHYSICIAN:  Hollice Espy, M.D.DATE OF BIRTH:  24-Jan-1941   DATE OF ADMISSION:  12/30/2007  DATE OF DISCHARGE:                              HISTORY & PHYSICAL   CONSULTATIONS:  1. Sanjeev K. Corliss Skains, M.D., interventional radiology.  2. Cliffton Asters, M.D., infectious disease.   CHIEF COMPLAINT:  Back pain.   HISTORY OF PRESENT ILLNESS:  The patient is a 70 year old white female  with past medical history of hypertension who underwent a kyphoplasty on  November 01, 2007 for vertebral compression fracture.  Since that time, she  has actually complained of continued back pain and has had actually  worsening pain since the procedure.  She was evaluated by her PCP as  well as intervention radiology.  She had a follow up MRI done on Dec 04, 2007 one month later.  She was found to have the following:  Marked  interval change with abnormality from the T8-T12 levels.  Specifically  she was found to have altered signal intensity at the T10 vertebra where  she had her kyphoplasty at. but now interval development of altered  signal intensity and irregular enhancement at T8-T9, T11-T12,  surrounding soft tissues including epidural enhancement with some mild  spinal stenosis at T8-T9 and T10-C11.  Felt not to be typical post-  procedure configuration.  Tumor could not be excluded. Infection was  also a possibility given the continuity of findings and epidural  enhancement.  On her lumbar spine found some questionable significance,  a tiny area of enhancement right aspect of thecal sac at L2-L3 level.  The patient had a follow up MRI done on Dec 28, 2007 which showed  interval T11 superior end-plate fracture, steady  progression of bone  marrow edema, confluent involvement of T8-1 enhancement, prevertebral  inflammation sparing of disk spaces suggestive of mycobacterial  infection, but other infectious etiologies including fungal disease,  although this would be unusual transfer tumor spread.  This was  discussed with Dr. Corliss Skains and Dr. Orvan Falconer of infectious disease.  Dr. Corliss Skains had the patient come in on Dec 30, 2007 in the morning and  she underwent multiple area biopsy in her lower thoracic spine.  Following that, she was admitted to the hospital.  There, Dr. Orvan Falconer  will follow up from an infectious disease standpoint.   Currently the patient is seen post-procedure in her hospital room.  She  complains of some minor back pain and feels somewhat sleepy following  anesthesia.  She denies any headaches, vision changes, dysphagia, chest  pain, palpitations, shortness of breath, wheeze, cough, abdominal pain,  hematuria, dysuria and diarrhea.  She has problems with chronic  constipation.  She denies any focal extremity numbness, weakness or  pain, although she says because of the back pain, she has spent a lot of  time in bed and has been using her right arm to move around, so she does  feel some  tightness and soreness in that right arm.   REVIEW OF SYSTEMS:  Otherwise negative other than some lower back pain  which currently is doing well with medication.   PAST MEDICAL HISTORY:  1. Hypertension.  2. Previous history of T10 compression fracture following kyphoplasty.  3. Depression.  4. Chronic pain.  5. Chronic constipation.   MEDICATIONS:  1. Evista 60.  2. Multivitamin daily.  3. Amitiza 25 p.o. b.i.d.  4. Indapamide 2.5 p.o. b.i.d.  5. Stool softener 1-2 daily.  6. Celebrex 200 p.r.n.  7. Darvocet-N 100 one p.o. q. 4-6 hours p.r.n.  8. Valium 5 mg 1-2 tablets p.o. daily p.r.n.  9. Percocet 5/325 one-two p.o. q. 4-6 hours p.r.n.  10.Cymbalta 60 mg p.o. daily.  11.Calcium Os-Cal D 1 p.o. b.i.d.  12.K-Dur 10 mEq p.o. daily.   ALLERGIES:  NO KNOWN DRUG ALLERGIES.    SOCIAL HISTORY:  No tobacco, alcohol or drug use.   FAMILY HISTORY:  Noncontributory.   PHYSICAL EXAMINATION:  VITAL SIGNS on arrival to the floor:  Temperature  97.8, heart rate 71, blood pressure 118/74, respirations 20, O2 sat 90%  on room air.  GENERAL:  She is alert and oriented x3 in no apparent distress.  HEENT: Normocephalic atraumatic.  Mucous membranes are moist.  NECK:  She has no carotid bruits.  HEART:  Regular rate and rhythm, S1-S2.  LUNGS:  Clear to auscultation bilaterally.  ABDOMEN:  Soft, nontender, nondistended.  Hypoactive bowel sounds.  EXTREMITIES:  No clubbing, cyanosis or edema.   LABORATORY DATA:  Sed rate drawn yesterday shows a mild increase of 25,  sodium 136, potassium 2.7, chloride 94, bicarb 32, BUN 8, creatinine  0.5, glucose 118, white count 11, H&H 32.8 and 41, MCV of 87, platelet  count 402.   ASSESSMENT/PLAN:  1. Back pain with irregular magnetic resonance imaging.  Question      tumor versus infection, status post biopsy.      Infectious disease to see and check blood cultures.  2. Hypertension on indapamide.  3. Depression.  Continue Cymbalta.      Hollice Espy, M.D.  Electronically Signed     SKK/MEDQ  D:  12/30/2007  T:  12/30/2007  Job:  [id number]   cc:   Cliffton Asters, M.D.  Sanjeev K. Corliss Skains, M.D.  Robert L. Foy Gu[place], M.D.

## 2010-12-15 NOTE — Discharge Summary (Signed)
[name], [name]                  ACCOUNT NO.:  [id number]   MEDICAL RECORD NO.:  [id number]          PATIENT TYPE:  IPS   LOCATION:  4005                         FACILITY:  [facility]   PHYSICIAN:  Ranelle Oyster, M.D.DATE OF BIRTH:  05/13/1941   DATE OF ADMISSION:  06/17/2008  DATE OF DISCHARGE:  06/24/2008                               DISCHARGE SUMMARY   DISCHARGE DIAGNOSIS:  1. 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  2. Pneumonia, treated.  3. Atrial fibrillation, resolved.  4. Dyspepsia, question antibiotic related.  5. Acute blood loss anemia.  6. Non-Hodgkin lymphoma, Burkitt type.   HISTORY OF PRESENT ILLNESS:  [name] is a 70 year old female with  history of hypertension, history of multiple compression fractures and  vertebroplasty with needle biopsy without evidence of malignancy.  The  patient continued with back pain with MRI showing interval development  of abnormality T8 through T12 with extension of compression fractures T9  through T11 with question of tumor versus infection.  The patient  developed pathological fractures of T7 with low back pain and weakness  of left lower extremity.  X-rays done recently showed pathological  fractures T1 and T12 with cord compression at T7 secondary to fracture.  She was admitted on May 28, 2008, for bone biopsy of T7, T8, T9,  T11, and T12 with kyphoplasty T7, T11, and T12 with decompression of  cord at T7 and fixation T4 through L3 by Dr. Danielle Dess.  The patient has a  history of abdominal and chest borderline adenopathy per CT films in the  past.  Dr. Gaylyn Rong is following and Port-A-Cath is in place currently.  Pathology was positive for non-Hodgkin B-cell lymphoma, full report  pending.   The patient was noted to have issues with hypertension as well as pain  management  transferred to rehab on June 04, 2008, for progressive  therapies.  She was participating along in therapies; however, on  June 19, 2008, the patient was noted to have tachycardia with heart  rate in 170 seconds to SVT and flutter.  She was transferred to stepdown  for close of cardiac monitoring and treatment.  CT of chest done showed  no PE, bilateral lower lobe atelectasis, as well as right middle lobe  and lingular atelectasis.  She was started on IV antibiotics for  leukocytosis and pneumonia on June 10, 2008.  A 2-D echo done showed  question mass in atrium.  TEE of June 14, 2008, showed EF of 60%,  increased aortic valve thickness.  No mass or vegetation noted.  Dr.  Deborah Chalk has been following the patient for cardiac issues.  Beta-blocker  and Lanoxin added for treatment of AFib with conversion to normal sinus  rhythm.  Therapies have been initiated, as the patient has been  stabilized.  Currently, the patient continues to have issues with bed  mobility as well as ambulation.  It has impairments in  self-care.  She  was transferred back to rehab for continued CIR program.   Past medical history is significant for:  1. Hypertension.  2. Depression.  3. Chronic pain.  4. Constipation.  5. Obesity.  6. Gallstones.  7. Port-A-Cath placement.  8. Multiple thoracic compression fractures.   ALLERGIES:  No known drug allergies.   Family history is positive for cancer.   SOCIAL HISTORY:  The patient is married, lives in 1-level home with no  steps at entry.  Husband in good health.  She has 5 supportive  daughters.  She does not use any tobacco or alcohol.   FUNCTIONAL HISTORY:  The patient was independent with rolling walker  prior to admission.  Has been impaired by back pain and left lower  extremity weakness most recently.   FUNCTIONAL STATUS:  The patient is min-to61mod assist for bed mobility,  min assist for transfers, min guard assist for taking 4 steps with  1  loss of balance reported, requires assist for ADLs.   PHYSICAL EXAMINATION:  GENERAL:  The patient is an obese, well-  nourished, well-developed female in no acute distress.  HEENT:  Normocephalic, atraumatic.  Extraocular movements intact.  Nares  patent.  Tongue midline.  Moist oral mucosa.  Good dentition.  NECK:  Supple without masses or JVD.  LUNGS:  Some audible wheezing.  No rales or rhonchi.  HEART:  Regular rate and rhythm without murmurs.  ABDOMEN:  Soft, nontender with positive bowel sounds.  MUSCULOSKELETAL:  No edema, cyanosis.  Left lower extremity weakness  noted.  SKIN:  Long back incision from thoracic to sacral area, is intact,  clean, and dry without erythema.  NEUROLOGIC:  Cranial nerves II through XII intact.  Sensation decreased  in bilateral lower extremity.  Judgment orientation and memory within  functional limits.  The mood shows flat affect with borderline anxiety.   HOSPITAL COURSE:  [name] was admitted to rehab on June 07, 2008, for inpatient therapies to consist of PT/OT at least 3 hours 5  days a week.  Past admission, Physiatry, rehab RN, and therapy team have  worked together to provide customized collaborative interdisciplinary  care during her stay.  Team conference was held to assess the patient's  progress, goals, as well as discussed barrier to discharge.  At the time  of admission, the patient reported some issues with diarrhea as well as  nausea.  Secondary to the patient's ongoing antibiotics, stool was sent  for C. diff; however, the patient's specimen was denied, it was too  form.  The patient's has had no further diarrhea during the stay.  She  has continued to have issues with nausea requiring p.r.n. doses of  Zofran.  Rehab RN has been following for wound care, toileting, as well  as premedicating the patient with pain meds prior to therapy to help her  tolerance of therapies.  The patient's voiding was monitored with   bladder scan, PVR checks.  The patient was noted to be voiding without  any signs of retention.  The patient was noted to have issues with  constipation requiring addition of 10 of Senokot-S 2 p.o. nightly.  With  this added as bowel program, the patient has had good results without  any recurrent diarrhea.  The patient has also had issues with right  shoulder pain that had been limiting her transfers and mobility.  X-rays  of right shoulder showed no evidence of bony metastatic disease.  Bones  appearing osteopenic.  The patient was noted to have a trigger point  tenderness of the right infraspinatus muscle and this was injected with  improvement in the patient's symptomatology.  The patient was maintained  on Augmentin 875 mg p.o. b.i.d. to complete 14 total days of antibiotic  therapy.  Followup x-rays had been done during this stay.  X-rays of  June 19, 2008, showed improved CHF with residual densities of left  lung.  Labs were done to include check of BNP on June 18, 2008, this  was noted to be at 240.  Check of lytes additionally revealed sodium  142, potassium 3.6, chloride 100, CO2 37, BUN 2, creatinine 0.62,  glucose 102.  CBC done revealed hemoglobin 9.5, 28.4, white count 8.8,  platelets 582.  The patient was noted to have elevated blood pressure at  the time of admission.  Prinivil was initially initiated at 10 mg a day.  She continues to have fluctuation in BP and this was increased to 20 mg  p.o. per day prior to discharge.  The patient's blood pressures hade  been checked on b.i.d. basis during the stay.  At the time of discharge,  these are ranging from 130s to 170s systolic/ 70s to 16X diastolic.  Of  note, the patient is noted to have occasional balance with increased  pain, which tends to elevate her BP.  Recheck lytes were done prior to  discharge.  This revealed sodium 137, potassium 3.3, chloride 99, CO2  31, BUN 3, creatinine 0.62, glucose 118.  The patient  was started on  potassium supplements 20 mEq a day.  She is to continue this for 7 total  days.  Anticipate potassium normalizing out, as she is not currently on  diuretics or IV fluids.  Recheck CBCs of June 23, 2008, revealed H  and H to be stable with hemoglobin 9.8, hematocrit 29.5.  Recheck chest  x-ray of June 20, 2008, shows some increase in CHF with subsegmental  atelectasis, left lower lung zone.  Followup spine films were done on  June 24, 2008, prior to discharge, as the patient with some increase  in pain due to lying on the bedpan for prolonged period of time.  Neurosurgery was contacted regarding need for x-rays prior to discharge  and they recommended just taking some plain films to make show hardware  was intact.  X-rays done revealed thoracolumbar fusion to be stable  without complications.   Nursing has been working with therapy team to premedicate the patient  with meds prior to therapy.  The patient's participation in therapy has  variant.  The patient requiring increased assistance with pain was a  limiting factor.  The family has been very supportive and has been here  and has done hands on care during the stay.  OT and PT have worked with  family throughout the stay with instructions on assisting with self-care  with use of assistive equipment for lower body care as well as transfers  in and out of bathtub, transfers from bed to chair, bed to commode, car  transfers, as well as stay transfers.  At the time of admission, the  patient was noted to be limited by decrease in activity tolerance,  decrease in dynamic standing balance, decrease in ability to perform  ADLs due to decreased bilateral upper extremity strength and pain  limitations due to discomfort in back and right shoulder.  With  occupational therapy, the patient is currently progressed along to being  at supervision level for shower transfers.  She is able to use assistive  equipment to [employer].  She is at supervision for dressing of lower  body.  She is able to don-doff TLSO independently.  She is able to use  toilet aid for peri-care and hygiene.  The physical therapy evaluation  at admission revealed the patient at min-to-mod assist for bed mobility  with mild sensory deficits and lower extremity impairing her mobility.  The patient is currently min assist for bed mobility to log roll.  She  is at supervision for transfers.  She is able to ambulate up to 150 feet  with supervision with extra time needed due to pain and fatigue.  She is  able to navigate, ramp, and curve with rolling walker with supervision.  She is able to perform car transfers with min assist to lift left lower  extremity.  The patient will have 24-hour supervision provided by family  past discharge.  Further followup therapies to include home health, PT,  OT by [facility].  On June 24, 2008, the patient  is discharged to home.   DISCHARGE MEDICATIONS:  1. Ferrous sulfate 325 mg b.i.d.  2. Prinivil 20 mg a day.  3. Os-Cal plus D b.i.d.  4. Lanoxin 0.125 mg a day.  5. OxyContin CR 10 mg b.i.d.  6. Cymbalta 60 mg a day.  7. Metoprolol 50 mg b.i.d.  8. Prilosec OTC 20 mg a day.  9. Amitiza 24 mcg b.i.d. or continue Senokot-S 2 p.o. nightly.  10.Lidocaine patch to right shoulder on 8:00 a.m., off 8:00 p.m.      daily.  11.Hydrocodone 5/325 one p.o. q.4-6 h. p.r.n. pain or if using 2 pills      spread out to 6-8 hours p.r.n. pain, #90 Rx.  12.Ultram 50 mg q.6 h. p.r.n. mild pain.  13.Augmentin 875 mg 1 p.o. b.i.d. through June 25, 2008.  14.Zofran 4 mg 1-2 q.6-8 h. p.r.n. nausea.  15.K-Dur 10 mEq a day.  16.Valium 5 mg half to one q.6-8 h. p.r.n. anxiety, #10 RX.   Diet is regular.   WOUND CARE:  Keep area clean and dry.   Activity level is a 24-hour supervision.  No strenuous activity.  Follow  back precautions.  TLSO to be don at the edge of the bed.  Walk with   supervision.   SPECIAL INSTRUCTIONS:  Do not use Celebrex, lisinopril,  hydrochlorothiazide, or Evista.  [facility] to provide PT/OT.   FOLLOWUP:  The patient to follow up with Dr. Gaylyn Rong, Oncology next Monday,  follow up with Dr. Craige Cotta for routine check in the next few weeks, follow  up with Dr. Danielle Dess for postop check in 2-3 weeks, follow up with Dr.  Deborah Chalk for cardiac issues in 2-3 weeks, follow up with Dr. Riley Kill as  needed.      Greg Cutter, P.A.      Ranelle Oyster, M.D.  Electronically Signed    PP/MEDQ  D:  06/25/2008  T:  06/25/2008  Job:  [id number]   cc:   Stefani Dama, M.D.  Jethro Bolus, MD  Colleen Can. Deborah Chalk, M.D.  Robert L. Foy Guadalajara, M.D.

## 2010-12-15 NOTE — H&P (Signed)
[name], [name]                  ACCOUNT NO.:  [id number]   MEDICAL RECORD NO.:  [id number]          PATIENT TYPE:  IPS   LOCATION:  4007                         FACILITY:  [facility]   PHYSICIAN:  Ranelle Oyster, M.D.DATE OF BIRTH:  01-20-1941   DATE OF ADMISSION:  06/04/2008  DATE OF DISCHARGE:                              HISTORY & PHYSICAL   PRIMARY CARE PHYSICIAN:  Robert L. Foy Guadalajara, MD   NEUROSURGEON:  Stefani Dama, MD   CHIEF COMPLAINT:  Back pain and lower extremity weakness.   HISTORY OF PRESENT ILLNESS:  This 70 year old white female with  hypertension and T10 compression fracture with kyphoplasty in April  2009.  The patient developed worsening of pain and workup in May 2009  revealed interval development of an abnormality at T8 through T12 with  extension of compression fractures at the areas of T9-T11.  There was  some question of tumor versus infection.  Bone biopsy was benign and  notable for fibrous connective tissue.  The patient developed  pathological T7 fracture with low back pain, leg pain, and weakness in  the left lower extremity in particular.  X-rays done recently with T7-L1  involvement and pathological fracture of T1 and T12 with cord  compression at T7 secondary to fracture.  The patient was admitted on  May 28, 2008, for bone biopsy at T7, T8, T9, T11, and T12 with  kyphoplasty at T7, T11, and T12, and decompression via laminectomy and  arthrodesis at T4-L3 by Dr. Danielle Dess.  Bone biopsy with flow cytometry is  notable for absence of monoclonal B-cell population or abnormal T-cell  phenotypes.  The patient continues to have probable dizziness and back  pains as well as orthostasis.  Dr. Margo Aye, Hematology/Oncology, is  following the patient.  The CT of the abdomen and chest with borderline  adenopathy in the periaortic and retroperitoneal areas.  They are  waiting on full treatment options at this point.  There is some question  of lymphoma.  The  patient has made some progress in her mobility and  self care, but is still needing significant assistance.  Rehab was  consulted and felt that she could benefit from an inpatient program.   REVIEW OF SYSTEMS:  Notable for constipation.  She had pain issues.  She  has some dizziness as well.  Mood has been a bit flat at times.  Other  pertinent positives are above and full 14-point review is in the written  H and P.   PAST MEDICAL HISTORY:  Positive for hypertension, depression, chronic  pain, chronic constipation, DJD, right total knee replacement of three  T10 compression fracture and kyphoplasty in April 2009 with extension of  T9-T11 in May 2009.  Gallstones, VAC, asthma, pneumonia, and morbid  obesity.   FAMILY HISTORY:  Positive for cancer.   SOCIAL HISTORY:  The patient is married.  Lives in one-level house with  ramp to enter.  She does not smoke or drink.  Family has daycare next  door.  She has ample assist through her daughters and family.   FUNCTIONAL  HISTORY:  The patient was independent walking about a month  ago, but has declined over the last month due to increased pain and  weakness.  She used a walker most recently, but now needs significant  help.  At the time of rehab consultation, the patient was requiring  total assist for basic mobility and transfers.  As of today, she is  requiring mod assist for basic transfers.  She is able to sit at the  edge of the bed for 8 minutes without having to stop.  She did some  short distance ambulation with therapy at mod assist.  Bed mobility is  min to mod assist.   ALLERGIES:  None.   HOME MEDICATIONS:  Prednisone nebulizer, Evista, oxycodone, Celebrex,  diazepam, lisinopril, Cymbalta, Prilosec, OxyContin, multivitamin,  Advair, calcium, and Symbicort.   LABS:  Hemoglobin 13.6, white count 12.5, platelet 476.  Sodium 136,  potassium 4.0, BUN 11, creatinine 0.6.  Chest x-ray from May 29, 2008, was questionable for  atelectasis as well as pneumonia.  Most  recent CBC is notable for decreasing white blood cell count to 9.2 and  decrease in hemoglobin 9.9.   PHYSICAL EXAMINATION:  VITAL SIGNS:  Blood pressure 111/60, pulse is  111, respiratory rate 18, and temperature 97.7.  GENERAL:  The patient is pleasant.  Appears to be in moderate distress  due to pain.  HEENT:  Pupils equally round and reactive to light.  Ear, nose, and  throat exam is notable for fair dentition and mucosa is slightly dry  over the tongue.  NECK:  Supple without JVD or lymphadenopathy.  CHEST:  Clear to auscultation bilaterally with perhaps some crackles at  the bases.  HEART:  Regular rate and rhythm without murmurs, rubs, or gallops.  EXTREMITIES:  No clubbing, cyanosis, or edema.  ABDOMEN:  Soft and nontender.  Bowel sounds are active.  SKIN:  Notable for intact wound with staples along the spine.  No  drainage was appreciated today.  NEUROLOGIC:  Cranial nerves II through XII are intact.  Reflexes are 2+.  She may have some decreased sensation over the bilateral legs, although  improved from yesterday and was more inconsistent.  Strength is improved  in the lower extremities to 2/5 proximal to 3+ to 4/5 distally.  Upper  extremity strength is 4 to 4+/5 with pain inhibition.  Reflexes are 1+.  Judgment, orientation, and memory are all within functional limits.  Mood is a bit flat, more related to her pain this afternoon.   ASSESSMENT/PLAN:  1. Functional deficits secondary to pathological fractures at T1, T7,      and T12 with the cord compression.  Definite diagnosis is up in the      air as far as source of her disease.  The patient was admitted to      the inpatient rehab unit today to receive collaborative and      interdisciplinary care between the physiatrist, rehab nursing      staff, and therapy team.  The patient's level of medical complexity      and substantial therapy needs in context of the medical  necessity      cannot be provided unless her intensity of care.  Physiatrist will      be provided 24-hour management of medical needs as well as      oversight of the therapy plan/treatment and provide guidance as      appropriate regarding interaction of the two.  24-hour rehab  nursing will assist for the management of the patient's pain      issues.  We also need to work on the patient's constipation, which      is probably acute on chronic.  She seems to be voiding fairly well,      but will check for PVRs and signs of neurogenic bladder.  Follow      for skin care needs as it pertains to back and pressure spots.      Encourage appropriate nutrition.  Encourage therapy concepts,      techniques, etc.  Physical therapy will assess and treat for gait      training and lower extremity strengthening.  She seems to be having      nice return from a neurological standpoint in both legs.  She      should progress nicely from a mobility standpoint.  She will need      appropriate adaptive equipment including a walker.  Occupational      therapy will assess and treat for upper extremity use and      activities of daily living in the setting of back pain, wound care,      and other surgical precautions.  She has little dysfunction in the      upper extremities other than that caused by pain inhibition.  Case      management/social worker will assess for psychosocial needs and      discharge planning.  The patient will receive at least 3 hours of      therapy per day at least 5 days per week.  Team conferences will be      held weekly to establish goals, assess progress, and determine      barriers at discharge.  Rehab goals are modified independent with      most mobility tasks and modified independent to occasional min      assist with activities of daily living.  Estimated length of stay      is 10+ days.  Prognosis good.  2. Questionable pneumonia, left lower lobe.  Chest sounds fairly  good      today.  We will check a chest x-ray and observe for progress from      last one performed on the June 29, 2008.  3. Pain management:  Resume OxyContin and monitor for improvement.      She may benefit from short-acting medication prior to her a.m. and      p.m. therapy as well.  4. Constipation:  The patient with bowel movement today.  Continue      senna-S and Amitiza.  She expressed her urgent need to go again      further.  Follow up with enema if needed today.  We will need to be      aggressive with her regular regimen.  5. Deep vein thrombosis prophylaxis:  We would add subcu Lovenox for      now.  The patient is at high risk for deep vein thrombosis until      she started moving more.  6. Hypertension:  We will follow on the hydrochlorothiazide and      Prinivil.  Blood pressure is controlled at this point.  She has had      some dizziness on orthostasis, which will need to be monitored when      she is up with rehab.      Ranelle Oyster, M.D.  Electronically Signed  ZTS/MEDQ  D:  06/04/2008  T:  06/05/2008  Job:  [id number]

## 2010-12-15 NOTE — Consult Note (Signed)
[name], [name]                  ACCOUNT NO.:  [id number]   MEDICAL RECORD NO.:  [id number]          PATIENT TYPE:  OUT   LOCATION:  XRAY                         FACILITY:  [facility]   PHYSICIAN:  Sanjeev K. Deveshwar, M.D.DATE OF BIRTH:  1940/12/10   DATE OF CONSULTATION:  04/22/2008  DATE OF DISCHARGE:                                 CONSULTATION   DATE OF CONSULT:  April 22, 2008.   CHIEF COMPLAINT:  Back pain.   HISTORY OF PRESENT ILLNESS:  This is a very pleasant 70 year old female  who was admitted to [facility] on Dec 30, 2007, to January 02, 2008, by Dr. Rito Ehrlich for evaluation of back pain.  The patient does  have a history of compression fractures.  An MRI was performed on October 26, 2007, that showed a compression fracture at the T10 level with an  80% loss of height.  On November 01, 2007, the patient had a vertebroplasty  performed by Dr. Corliss Skains at the T10 level along with a deep core bone  biopsy.  The biopsy showed benign bone and fibrous connective tissue.  There were no monomorphic infiltrates or atypia identified.   The patient was seen in followup by Dr. Corliss Skains on November 15, 2007, at  that time her pain is significantly improved; however, in May 2009, the  patient developed further back pain.  A repeat MRI was performed of the  lumbar and thoracic spines with and without contrast on Dec 04, 2007.  This showed a marked interval change with an abnormality from T8 through  T12 with mild cord compression.  The lumbar MRI showed a tiny area of  enhancement in the right aspect of the thecal sac at the L2-L3 level.  The etiology and significance was indeterminate.  Further followup was  recommended.  The patient was also noted to have some scattered  degenerative changes and spinal stenosis.   The patient was seen in followup by Dr. Corliss Skains on Dec 08, 2007, at  that time she was having increased pain.  Dr. Corliss Skains could not  explain the new changes at the  T8 through T12 levels.  He felt that this  might possibly be reaction to the previous vertebroplasty or possibly  some sort of infectious process.   The patient was admitted to [facility] on Dec 30, 2007, by Dr.  Rito Ehrlich.  An infectious disease consult was obtained from Dr.  Sampson Goon.  Her pain was treated with OxyContin.  Cultures and  pathology reports were still inconclusive.  Dr. Sampson Goon felt that  this was atypical for infection and thought that it possibly might be an  inflammatory process.   On Dec 30, 2007, the patient had a fluoro-guided needle biopsy at the T9-  T10 levels performed by Dr. Corliss Skains.  This revealed necrosis and  crushed artifact.  No malignancy was identified.   An MRI of the thoracic and lumbar spine was repeated on January 24, 2008.  Again, this showed a persistent lower thoracic abnormality involving the  T8 and T12 levels with decreased epidural enhancement  from T8 through  T10.  There was progression of the posterolateral paraspinal soft tissue  abnormality.  There was also noted to be evolution of the T11  compression fracture with mild retropulsion and mass effect on the  spinal cord similar to that previously seen at the T10 level.  The T10  level appeared to be stable status post vertebroplasty.   Dr. Corliss Skains saw the patient again in followup on January 31, 2008, at that  time he recommended repeating an MRI in 6 weeks.  He also felt that the  patient might need a further biopsy.  He recommended further followup  with Dr. Foy Guadalajara, her primary care physician and Dr. Sampson Goon, the  infectious disease physician.   An MRI of the thoracic spine with and without contrast was performed  today.  This showed progression of disease in the thoracic spine with  abnormality now involving T7 through T12, previously T8 through T12.  There was also a new compression fracture at T7 and abnormal enhancement  at T7 through T12 with right paraspinal muscle  inflammation and  enhancement at T7 and T8.  It was felt that this favored a progressive  atypical infection, possibly mycobacteria or fungal.  There was also a  new spinal stenosis at T7-T8.  The lumbar spine MRI showed a posterior  epidural soft-tissue thickening and enhancement with spinal stenosis  with the paraspinal process extending from L1-L2 and L3 with increased  retroperitoneal lymphadenopathy.  Again, it was felt that this was  consistent with an atypical infection.  The patient presents today  accompanied by her daughter to discuss these findings.   PAST MEDICAL HISTORY:  Please see history as noted above.  The patient  also has a history of hypertension, depression, chronic pain, and  chronic constipation.   ALLERGIES:  No known drug allergies.   MEDICATIONS:  The patient did not bring a list of her medications.  She  still takes OxyContin for pain.   SOCIAL HISTORY:  The patient is married.  She does not use alcohol or  tobacco.  She has never been a smoker but has been exposed to secondhand  smoke.  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  She and her husband live in  [place].   FAMILY HISTORY:  Her mother died at age 48 from lymphoma and breast  cancer.  Her father died at age 43 from arteriosclerosis.  She has no  siblings.  She has 5 daughters who are alive and well.   PAST SURGICAL HISTORY:  Significant for knee replacement surgery.   IMPRESSION AND PLAN:  As noted the patient returns today to discuss the  findings of the most recent MRI, which was performed on April 21, 2008.  Please see the summation of the report above.  Dr. Corliss Skains  feels that the patient has had progression of her disease as well as a  new fracture at the T7 level.  This is felt most likely due to  osteoporosis with a possible atypical infection.  Dr. Corliss Skains has  recommended a possible repeat biopsy at the L1 level.  He plans to  discuss this case with Dr. Sampson Goon prior to  making a final decision.  The T7 compression fracture will not be addressed at this time due to  the possibility of ongoing infection.   The patient continues to have back pain, although she has been somewhat  more active than previously.  She has noted some low-grade fevers of  approximately 99 at night.  She has an occasional cough with sputum.   Once again, Dr. Corliss Skains plans to discuss this case with Dr. Sampson Goon  prior to proceeding.  We will be in contact with the patient and her  family within the next day or two to advise them of the plan.    Greater than 25 minutes was spent on this visit.      Delton See, P.A.    ______________________________  Grandville Silos. Corliss Skains, M.D.    DR/MEDQ  D:  04/22/2008  T:  04/23/2008  Job:  [id number]   cc:   Molly Maduro L. Foy Guadalajara, M.D.  Mick Sell, MD  Hollice Espy, M.D.

## 2011-04-26 LAB — I-STAT 8, (EC8 V) (CONVERTED LAB)
Acid-Base Excess: 8 — ABNORMAL HIGH
BUN: 13
Bicarbonate: 30.3 — ABNORMAL HIGH
Chloride: 100
HCT: 49 — ABNORMAL HIGH
Hemoglobin: 16.7 — ABNORMAL HIGH
Operator id: 277751
Sodium: 135

## 2011-04-26 LAB — POCT I-STAT CREATININE: Creatinine, Ser: 0.7

## 2011-04-27 LAB — CBC
HCT: 42
Hemoglobin: 14.1
MCHC: 33.7
MCV: 87.6
Platelets: 315
RBC: 4.8
RDW: 13.5
WBC: 8.1

## 2011-04-27 LAB — BASIC METABOLIC PANEL
BUN: 9
CO2: 28
Calcium: 8.9
Chloride: 99
Creatinine, Ser: 0.52
GFR calc Af Amer: >60
GFR calc non Af Amer: >60
Glucose, Bld: 96
Potassium: 2.8 — ABNORMAL LOW
Sodium: 138

## 2011-04-27 LAB — PROTIME-INR
INR: 0.9
Prothrombin Time: 12.6

## 2011-04-28 LAB — DIFFERENTIAL
Basophils Absolute: 0.2 — ABNORMAL HIGH
Basophils Absolute: 0.1
Basophils Absolute: 0.1
Basophils Relative: 1
Eosinophils Relative: 4
Eosinophils Relative: 3
Lymphocytes Relative: 35
Lymphocytes Relative: 33
Lymphs Abs: 3.6
Monocytes Relative: 7
Monocytes Relative: 8
Monocytes Relative: 9
Neutro Abs: 5.9
Neutro Abs: 6
Neutrophils Relative %: 52
Neutrophils Relative %: 59

## 2011-04-28 LAB — CBC
HCT: 36.8
HCT: 38.4
HCT: 40.8
Hemoglobin: 13
Hemoglobin: 13.8
MCV: 86.8
MCV: 86.9
Platelets: 426 — ABNORMAL HIGH
Platelets: 365
RDW: 13.4
RDW: 12.7
RDW: 13
RDW: 13.3
WBC: 10.4
WBC: 11 — ABNORMAL HIGH
WBC: 11 — ABNORMAL HIGH

## 2011-04-28 LAB — BASIC METABOLIC PANEL
BUN: 11
BUN: 8
Chloride: 94 — ABNORMAL LOW
Chloride: 96
Creatinine, Ser: 0.54
Creatinine, Ser: 0.69
GFR calc Af Amer: >60
GFR calc non Af Amer: >60
GFR calc non Af Amer: >60
Glucose, Bld: 109 — ABNORMAL HIGH
Potassium: 2.6 — CL
Potassium: 2.7 — CL

## 2011-04-28 LAB — CULTURE, BLOOD (ROUTINE X 2)
Culture: NO GROWTH
Culture: NO GROWTH

## 2011-04-28 LAB — GRAM STAIN

## 2011-04-28 LAB — ANAEROBIC CULTURE

## 2011-04-28 LAB — AFB CULTURE WITH SMEAR (NOT AT ARMC)

## 2011-04-28 LAB — SEDIMENTATION RATE
Sed Rate: 36 — ABNORMAL HIGH
Sed Rate: 25 — ABNORMAL HIGH

## 2011-04-28 LAB — COMPREHENSIVE METABOLIC PANEL
Alkaline Phosphatase: 101
BUN: 8
Creatinine, Ser: 0.59
Glucose, Bld: 86
Potassium: 3 — ABNORMAL LOW
Total Bilirubin: 0.8
Total Protein: 6.6

## 2011-04-28 LAB — APTT
aPTT: 33
aPTT: 33

## 2011-04-28 LAB — PROTIME-INR: INR: 0.9

## 2011-04-28 LAB — TISSUE CULTURE
Culture: NO GROWTH
Culture: NO GROWTH

## 2011-04-29 LAB — BASIC METABOLIC PANEL
BUN: 11
Creatinine, Ser: 0.57
GFR calc Af Amer: >60
GFR calc non Af Amer: >60

## 2011-04-29 LAB — DIFFERENTIAL
Eosinophils Absolute: 0.6
Lymphocytes Relative: 33
Lymphs Abs: 3.3
Monocytes Relative: 10
Neutro Abs: 5.2
Neutrophils Relative %: 51

## 2011-04-29 LAB — CBC
Platelets: 391
RBC: 4.2
WBC: 10.1

## 2011-05-03 LAB — BASIC METABOLIC PANEL
BUN: 11
BUN: 4 — ABNORMAL LOW
CO2: 31
CO2: 31
CO2: 31
Calcium: 9.5
Calcium: 8.7
Chloride: 97
Chloride: 101
Chloride: 102
Creatinine, Ser: 0.48
Creatinine, Ser: 0.5
Creatinine, Ser: 0.58
Creatinine, Ser: 0.62
GFR calc Af Amer: >60
GFR calc Af Amer: >60
GFR calc Af Amer: >60
GFR calc non Af Amer: >60
Glucose, Bld: 107 — ABNORMAL HIGH
Potassium: 3.3 — ABNORMAL LOW
Potassium: 3.9
Potassium: 4
Sodium: 137
Sodium: 137

## 2011-05-03 LAB — AFB CULTURE WITH SMEAR (NOT AT ARMC): Acid Fast Smear: NONE SEEN

## 2011-05-03 LAB — POCT I-STAT 7, (LYTES, BLD GAS, ICA,H+H)
Acid-Base Excess: 3 — ABNORMAL HIGH
Bicarbonate: 27.7 — ABNORMAL HIGH
HCT: 29 — ABNORMAL LOW
O2 Saturation: 100
pO2, Arterial: 176 — ABNORMAL HIGH

## 2011-05-03 LAB — CBC
HCT: 41.5
HCT: 30.2 — ABNORMAL LOW
Hemoglobin: 13.6
Hemoglobin: 9.9 — ABNORMAL LOW
MCHC: 32.9
MCHC: 32.7
MCHC: 34.4
MCV: 86.7
MCV: 87.1
MCV: 87.6
Platelets: 476 — ABNORMAL HIGH
RBC: 4.76
RBC: 3.45 — ABNORMAL LOW
RBC: 3.91
RDW: 14.1
WBC: 12.5 — ABNORMAL HIGH
WBC: 13 — ABNORMAL HIGH

## 2011-05-03 LAB — POCT I-STAT 4, (NA,K, GLUC, HGB,HCT)
Hemoglobin: 9.5 — ABNORMAL LOW
Sodium: 133 — ABNORMAL LOW

## 2011-05-03 LAB — ABO/RH: ABO/RH(D): A POS

## 2011-05-03 LAB — WOUND CULTURE
Culture: NO GROWTH
Gram Stain: NONE SEEN

## 2011-05-03 LAB — TYPE AND SCREEN: ABO/RH(D): A POS

## 2011-05-03 LAB — FUNGUS CULTURE W SMEAR

## 2011-05-03 LAB — PROTIME-INR: INR: 1

## 2011-05-04 LAB — DIFFERENTIAL
Basophils Absolute: 0.1
Basophils Absolute: 0
Basophils Absolute: 0.1
Eosinophils Absolute: 0.4
Eosinophils Absolute: 0.5
Eosinophils Absolute: 0.5
Eosinophils Relative: 4
Eosinophils Relative: 3
Eosinophils Relative: 5
Eosinophils Relative: 6 — ABNORMAL HIGH
Lymphocytes Relative: 19
Lymphocytes Relative: 16
Lymphocytes Relative: 21
Lymphs Abs: 2
Lymphs Abs: 1.4
Monocytes Absolute: 0.8
Monocytes Absolute: 0.6
Neutrophils Relative %: 63

## 2011-05-04 LAB — KAPPA/LAMBDA LIGHT CHAINS: Kappa, lambda light chain ratio: 0.44 (ref 0.26–1.65)

## 2011-05-04 LAB — COMPREHENSIVE METABOLIC PANEL
ALT: 17
AST: 19
AST: 19
Albumin: 1.9 — ABNORMAL LOW
CO2: 30
Calcium: 8.6
Chloride: 99
Creatinine, Ser: 0.44
Creatinine, Ser: 0.62
GFR calc Af Amer: >60
GFR calc Af Amer: >60
GFR calc non Af Amer: >60
Sodium: 137
Total Bilirubin: 0.6

## 2011-05-04 LAB — CBC
HCT: 40.5
HCT: 25.5 — ABNORMAL LOW
HCT: 26.2 — ABNORMAL LOW
HCT: 29.1 — ABNORMAL LOW
HCT: 29.5 — ABNORMAL LOW
HCT: 30.1 — ABNORMAL LOW
HCT: 32.6 — ABNORMAL LOW
Hemoglobin: 13.2
Hemoglobin: 10.1 — ABNORMAL LOW
Hemoglobin: 8.6 — ABNORMAL LOW
Hemoglobin: 8.7 — ABNORMAL LOW
Hemoglobin: 9.6 — ABNORMAL LOW
Hemoglobin: 9.6 — ABNORMAL LOW
Hemoglobin: 9.8 — ABNORMAL LOW
Hemoglobin: 9.9 — ABNORMAL LOW
MCHC: 32.8
MCHC: 32.9
MCHC: 33.3
MCHC: 33.3
MCV: 87.1
MCV: 85.3
MCV: 86
MCV: 86.5
MCV: 86.6
MCV: 87
MCV: 87.1
MCV: 88.2
MCV: 89.1
Platelets: 471 — ABNORMAL HIGH
Platelets: 414 — ABNORMAL HIGH
Platelets: 481 — ABNORMAL HIGH
Platelets: 582 — ABNORMAL HIGH
Platelets: 612 — ABNORMAL HIGH
Platelets: 705 — ABNORMAL HIGH
RBC: 2.87 — ABNORMAL LOW
RBC: 2.95 — ABNORMAL LOW
RBC: 3.02 — ABNORMAL LOW
RBC: 3.3 — ABNORMAL LOW
RBC: 3.34 — ABNORMAL LOW
RBC: 3.46 — ABNORMAL LOW
RDW: 13
RDW: 14
RDW: 14.5
RDW: 14.6
RDW: 14.8
RDW: 14.8
WBC: 10.6 — ABNORMAL HIGH
WBC: 11.9 — ABNORMAL HIGH
WBC: 12.8 — ABNORMAL HIGH
WBC: 13 — ABNORMAL HIGH
WBC: 7.9
WBC: 8.1
WBC: 9.9

## 2011-05-04 LAB — BASIC METABOLIC PANEL
BUN: 2 — ABNORMAL LOW
BUN: 2 — ABNORMAL LOW
BUN: 3 — ABNORMAL LOW
BUN: 4 — ABNORMAL LOW
BUN: 5 — ABNORMAL LOW
BUN: 5 — ABNORMAL LOW
BUN: 6
BUN: 7
CO2: 26
CO2: 27
CO2: 28
CO2: 33 — ABNORMAL HIGH
CO2: 35 — ABNORMAL HIGH
Calcium: 7.8 — ABNORMAL LOW
Calcium: 8.1 — ABNORMAL LOW
Calcium: 8.5
Calcium: 8.9
Chloride: 100
Chloride: 102
Chloride: 104
Chloride: 104
Chloride: 105
Chloride: 97
Chloride: 99
Creatinine, Ser: 0.41
Creatinine, Ser: 0.41
Creatinine, Ser: 0.44
Creatinine, Ser: 0.45
Creatinine, Ser: 0.46
Creatinine, Ser: 0.58
GFR calc Af Amer: >60
GFR calc Af Amer: >60
GFR calc Af Amer: >60
GFR calc Af Amer: >60
GFR calc non Af Amer: >60
GFR calc non Af Amer: >60
GFR calc non Af Amer: >60
GFR calc non Af Amer: >60
GFR calc non Af Amer: >60
Glucose, Bld: 101 — ABNORMAL HIGH
Glucose, Bld: 118 — ABNORMAL HIGH
Glucose, Bld: 126 — ABNORMAL HIGH
Glucose, Bld: 128 — ABNORMAL HIGH
Glucose, Bld: 144 — ABNORMAL HIGH
Glucose, Bld: 87
Glucose, Bld: 93
Potassium: 3 — ABNORMAL LOW
Potassium: 3.3 — ABNORMAL LOW
Potassium: 3.4 — ABNORMAL LOW
Potassium: 3.5
Potassium: 3.5
Potassium: 3.5
Potassium: 3.6
Sodium: 136
Sodium: 137
Sodium: 137
Sodium: 138
Sodium: 143

## 2011-05-04 LAB — URINALYSIS, ROUTINE W REFLEX MICROSCOPIC
Hgb urine dipstick: NEGATIVE
Ketones, ur: NEGATIVE
Nitrite: NEGATIVE
pH: 5.5

## 2011-05-04 LAB — HEMOGLOBIN AND HEMATOCRIT, BLOOD
HCT: 25.9 — ABNORMAL LOW
Hemoglobin: 8.7 — ABNORMAL LOW

## 2011-05-04 LAB — CARDIAC PANEL(CRET KIN+CKTOT+MB+TROPI)
CK, MB: 10.1 — ABNORMAL HIGH
CK, MB: 8.8 — ABNORMAL HIGH
Relative Index: INVALID
Total CK: 40
Troponin I: 0.02
Troponin I: <0.01

## 2011-05-04 LAB — URINE CULTURE
Colony Count: >=100000
Colony Count: NO GROWTH
Culture: NO GROWTH
Special Requests: NEGATIVE

## 2011-05-04 LAB — PROTEIN ELECTROPH W RFLX QUANT IMMUNOGLOBULINS
Albumin ELP: 39.7 — ABNORMAL LOW
Alpha-1-Globulin: 13.7 — ABNORMAL HIGH
Alpha-2-Globulin: 15.4 — ABNORMAL HIGH
M-Spike, %: NOT DETECTED
Total Protein ELP: 5.2 — ABNORMAL LOW

## 2011-05-04 LAB — VANCOMYCIN, TROUGH: Vancomycin Tr: 24.9 — ABNORMAL HIGH

## 2011-05-04 LAB — BLOOD GAS, ARTERIAL
Acid-Base Excess: 3.5 — ABNORMAL HIGH
Drawn by: 270091
O2 Content: 1
Patient temperature: 98.6
TCO2: 28.5
pCO2 arterial: 39.6
pH, Arterial: 7.453 — ABNORMAL HIGH

## 2011-05-04 LAB — B-NATRIURETIC PEPTIDE (CONVERTED LAB)
Pro B Natriuretic peptide (BNP): 240 — ABNORMAL HIGH
Pro B Natriuretic peptide (BNP): 84

## 2011-05-04 LAB — URINALYSIS, MICROSCOPIC ONLY
Bilirubin Urine: NEGATIVE
Hgb urine dipstick: NEGATIVE
Ketones, ur: NEGATIVE
Specific Gravity, Urine: 1.008
Urobilinogen, UA: 1

## 2011-05-04 LAB — HEMOGLOBIN A1C: Hgb A1c MFr Bld: 5.7

## 2011-05-04 LAB — VITAMIN B12: Vitamin B-12: 404 (ref 211–911)

## 2011-05-04 LAB — RETICULOCYTES
Retic Count, Absolute: 63.5
Retic Ct Pct: 1.9

## 2011-05-04 LAB — D-DIMER, QUANTITATIVE: D-Dimer, Quant: 19.89 — ABNORMAL HIGH

## 2011-05-04 LAB — FOLATE: Folate: 16.8

## 2011-05-04 LAB — MAGNESIUM: Magnesium: 1.7

## 2011-05-13 ENCOUNTER — Encounter: Payer: Self-pay | Admitting: Gastroenterology

## 2011-05-13 DIAGNOSIS — C8590 Non-Hodgkin lymphoma, unspecified, unspecified site: Secondary | ICD-10-CM | POA: Insufficient documentation

## 2011-05-19 ENCOUNTER — Encounter: Payer: Self-pay | Admitting: Gastroenterology

## 2011-05-25 ENCOUNTER — Other Ambulatory Visit: Payer: Self-pay | Admitting: Radiology

## 2011-06-02 ENCOUNTER — Encounter: Payer: Self-pay | Admitting: Gastroenterology

## 2011-06-09 ENCOUNTER — Other Ambulatory Visit: Payer: Self-pay | Admitting: Cardiology

## 2011-06-16 ENCOUNTER — Other Ambulatory Visit: Payer: Self-pay | Admitting: Cardiology

## 2011-07-17 ENCOUNTER — Other Ambulatory Visit: Payer: Self-pay

## 2011-07-17 ENCOUNTER — Emergency Department (HOSPITAL_COMMUNITY): Payer: Medicare Other

## 2011-07-17 ENCOUNTER — Inpatient Hospital Stay (HOSPITAL_COMMUNITY)
Admission: EM | Admit: 2011-07-17 | Discharge: 2011-07-22 | DRG: 065 | Disposition: A | Discharge status: Home / Self Care | Payer: Medicare Other | Attending: Internal Medicine | Admitting: Internal Medicine

## 2011-07-17 ENCOUNTER — Encounter (HOSPITAL_COMMUNITY): Payer: Self-pay

## 2011-07-17 DIAGNOSIS — Z96659 Presence of unspecified artificial knee joint: Secondary | ICD-10-CM

## 2011-07-17 DIAGNOSIS — I6350 Cerebral infarction due to unspecified occlusion or stenosis of unspecified cerebral artery: Principal | ICD-10-CM | POA: Diagnosis present

## 2011-07-17 DIAGNOSIS — M810 Age-related osteoporosis without current pathological fracture: Secondary | ICD-10-CM | POA: Diagnosis present

## 2011-07-17 DIAGNOSIS — F3289 Other specified depressive episodes: Secondary | ICD-10-CM | POA: Diagnosis present

## 2011-07-17 DIAGNOSIS — E876 Hypokalemia: Secondary | ICD-10-CM | POA: Diagnosis present

## 2011-07-17 DIAGNOSIS — J45909 Unspecified asthma, uncomplicated: Secondary | ICD-10-CM | POA: Diagnosis present

## 2011-07-17 DIAGNOSIS — I10 Essential (primary) hypertension: Secondary | ICD-10-CM

## 2011-07-17 DIAGNOSIS — H5509 Other forms of nystagmus: Secondary | ICD-10-CM | POA: Diagnosis present

## 2011-07-17 DIAGNOSIS — R42 Dizziness and giddiness: Secondary | ICD-10-CM

## 2011-07-17 DIAGNOSIS — E669 Obesity, unspecified: Secondary | ICD-10-CM | POA: Diagnosis present

## 2011-07-17 DIAGNOSIS — K5900 Constipation, unspecified: Secondary | ICD-10-CM | POA: Diagnosis present

## 2011-07-17 DIAGNOSIS — H5500 Unspecified nystagmus: Secondary | ICD-10-CM

## 2011-07-17 DIAGNOSIS — G8929 Other chronic pain: Secondary | ICD-10-CM | POA: Diagnosis present

## 2011-07-17 DIAGNOSIS — Z9849 Cataract extraction status, unspecified eye: Secondary | ICD-10-CM

## 2011-07-17 DIAGNOSIS — Z7982 Long term (current) use of aspirin: Secondary | ICD-10-CM

## 2011-07-17 DIAGNOSIS — I052 Rheumatic mitral stenosis with insufficiency: Secondary | ICD-10-CM | POA: Diagnosis present

## 2011-07-17 DIAGNOSIS — R112 Nausea with vomiting, unspecified: Secondary | ICD-10-CM | POA: Diagnosis present

## 2011-07-17 DIAGNOSIS — I517 Cardiomegaly: Secondary | ICD-10-CM | POA: Diagnosis present

## 2011-07-17 DIAGNOSIS — F329 Major depressive disorder, single episode, unspecified: Secondary | ICD-10-CM | POA: Diagnosis present

## 2011-07-17 DIAGNOSIS — M549 Dorsalgia, unspecified: Secondary | ICD-10-CM | POA: Diagnosis present

## 2011-07-17 DIAGNOSIS — R51 Headache: Secondary | ICD-10-CM | POA: Diagnosis present

## 2011-07-17 DIAGNOSIS — I639 Cerebral infarction, unspecified: Secondary | ICD-10-CM | POA: Diagnosis present

## 2011-07-17 DIAGNOSIS — Z8639 Personal history of other endocrine, nutritional and metabolic disease: Secondary | ICD-10-CM | POA: Diagnosis present

## 2011-07-17 DIAGNOSIS — C8589 Other specified types of non-Hodgkin lymphoma, extranodal and solid organ sites: Secondary | ICD-10-CM | POA: Diagnosis present

## 2011-07-17 DIAGNOSIS — Z79899 Other long term (current) drug therapy: Secondary | ICD-10-CM

## 2011-07-17 DIAGNOSIS — E785 Hyperlipidemia, unspecified: Secondary | ICD-10-CM | POA: Diagnosis present

## 2011-07-17 HISTORY — DX: Non-Hodgkin lymphoma, unspecified, unspecified site: C85.90

## 2011-07-17 LAB — URINALYSIS, ROUTINE W REFLEX MICROSCOPIC
Ketones, ur: NEGATIVE mg/dL
Leukocytes, UA: NEGATIVE
Protein, ur: NEGATIVE mg/dL
Urobilinogen, UA: 0.2 mg/dL (ref 0.0–1.0)

## 2011-07-17 LAB — COMPREHENSIVE METABOLIC PANEL
Albumin: 3 g/dL — ABNORMAL LOW (ref 3.5–5.2)
BUN: 16 mg/dL (ref 6–23)
Calcium: 8.8 mg/dL (ref 8.4–10.5)
Creatinine, Ser: 0.55 mg/dL (ref 0.50–1.10)
GFR calc Af Amer: >90 mL/min (ref >90)
Glucose, Bld: 117 mg/dL — ABNORMAL HIGH (ref 70–99)
Total Protein: 6.6 g/dL (ref 6.0–8.3)

## 2011-07-17 LAB — DIFFERENTIAL
Basophils Relative: 0 % (ref 0–1)
Eosinophils Absolute: 0 10*3/uL (ref 0.0–0.7)
Eosinophils Relative: 0 % (ref 0–5)
Lymphs Abs: 2.1 10*3/uL (ref 0.7–4.0)
Monocytes Absolute: 0.3 10*3/uL (ref 0.1–1.0)
Monocytes Relative: 4 % (ref 3–12)
Neutrophils Relative %: 69 % (ref 43–77)

## 2011-07-17 LAB — PROTIME-INR: Prothrombin Time: 13.1 seconds (ref 11.6–15.2)

## 2011-07-17 LAB — CBC
HCT: 37.7 % (ref 36.0–46.0)
Hemoglobin: 12.3 g/dL (ref 12.0–15.0)
MCH: 29.3 pg (ref 26.0–34.0)
MCHC: 32.6 g/dL (ref 30.0–36.0)
MCV: 89.8 fL (ref 78.0–100.0)

## 2011-07-17 LAB — APTT: aPTT: 31 seconds (ref 24–37)

## 2011-07-17 LAB — URINE MICROSCOPIC-ADD ON

## 2011-07-17 MED ORDER — SODIUM CHLORIDE 0.9 % IV BOLUS (SEPSIS)
750.0000 mL | Freq: Once | INTRAVENOUS | Status: AC
  Administered 2011-07-17: 750 mL via INTRAVENOUS

## 2011-07-17 MED ORDER — METOCLOPRAMIDE HCL 5 MG/ML IJ SOLN
10.0000 mg | Freq: Once | INTRAMUSCULAR | Status: AC
  Administered 2011-07-17: 10 mg via INTRAVENOUS
  Filled 2011-07-17: qty 2

## 2011-07-17 MED ORDER — ONDANSETRON HCL 4 MG/2ML IJ SOLN
INTRAMUSCULAR | Status: AC
  Administered 2011-07-17: 4 mg via INTRAVENOUS
  Filled 2011-07-17: qty 2

## 2011-07-17 MED ORDER — DIPHENHYDRAMINE HCL 50 MG/ML IJ SOLN
25.0000 mg | Freq: Once | INTRAMUSCULAR | Status: AC
  Administered 2011-07-17: 25 mg via INTRAVENOUS
  Filled 2011-07-17: qty 1

## 2011-07-17 MED ORDER — ONDANSETRON HCL 4 MG/2ML IJ SOLN
INTRAMUSCULAR | Status: AC
  Filled 2011-07-17: qty 2

## 2011-07-17 MED ORDER — MECLIZINE HCL 25 MG PO TABS
25.0000 mg | ORAL_TABLET | Freq: Once | ORAL | Status: AC
  Administered 2011-07-17: 25 mg via ORAL
  Filled 2011-07-17: qty 1

## 2011-07-17 MED ORDER — ONDANSETRON HCL 4 MG/2ML IJ SOLN
4.0000 mg | Freq: Once | INTRAMUSCULAR | Status: AC
  Administered 2011-07-17 (×2): 4 mg via INTRAVENOUS
  Filled 2011-07-17: qty 2

## 2011-07-17 MED ORDER — PROMETHAZINE HCL 25 MG/ML IJ SOLN
25.0000 mg | Freq: Once | INTRAMUSCULAR | Status: AC
  Administered 2011-07-17: 25 mg via INTRAVENOUS
  Filled 2011-07-17: qty 1

## 2011-07-17 MED ORDER — SODIUM CHLORIDE 0.9 % IV SOLN
INTRAVENOUS | Status: DC
  Administered 2011-07-17: 21:00:00 via INTRAVENOUS

## 2011-07-17 NOTE — ED Notes (Signed)
Hydration in progress, second bag started, 127ml/hr per order

## 2011-07-17 NOTE — ED Notes (Signed)
Pt brought by [facility] from home with c/o N/V/ and flu like s/x since 0930 this am, daughter called pcp and had rx for suppository phenergan called in, with little relieve.

## 2011-07-17 NOTE — ED Notes (Signed)
ZOX:[id number]<AV> Expected date:07/17/11<BR> Expected time: 3:55 PM<BR> Means of arrival:Ambulance<BR> Comments:<BR> EMS [facility] - cancer pt

## 2011-07-17 NOTE — ED Provider Notes (Cosign Needed)
History     CSN: [id number] Arrival date & time: 07/17/2011  5:54 PM   First MD Initiated Contact with Patient 07/17/11 1945      Chief Complaint  Patient presents with  . Nausea  . 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 She relates at 9:30 AM she started getting very dizzy and felt like the room was spinning. She states she did not have a headache until about 30 minutes prior to arrival that is a pressure headache in the frontal area. She has had nausea and vomiting but denies diarrhea or abdominal pain. She had cough yesterday that was dry. She denies fever. She states she is having cold chills. She states she's had something similar in the past when her potassium was low. She states any movement of her head makes the dizziness worse, nothing makes her feel better.  Primary care physician Dr. Eloise Harman Oncologist at [facility]  Past Medical History  Diagnosis Date  . SVT (supraventricular tachycardia)   . Asthma     hx of  . Hypertension   . Chronic constipation   . Obesity   . Depression     hx of  . Back pain   . Lymphoma     Past Surgical History  Procedure Date  . Back surgery 2009  . Bone biopsy   . Replacement total knee     right  . Cesarean section     Family History  Problem Relation Age of Onset  . Lymphoma Mother   . Breast cancer Mother   . Coronary artery disease Father 62    died  . Stroke Father     History  Substance Use Topics  . Smoking status: Never Smoker   . Smokeless tobacco: Not on file  . Alcohol Use: No   patient works in a daycare  OB History    Grav Para Term Preterm Abortions TAB SAB Ect Mult Living                  Review of Systems  All other systems reviewed and are negative.    Allergies  Review of patient's allergies  indicates no known allergies.  Home Medications   Current Outpatient Rx  Name Route Sig Dispense Refill  . ACETAMINOPHEN 500 MG PO TABS Oral Take 1,000 mg by mouth every 6 (six) hours as needed.     . ALBUTEROL SULFATE HFA 108 (90 BASE) MCG/ACT IN AERS Inhalation Inhale 2 puffs into the lungs every 6 (six) hours as needed. wheezing     . DULOXETINE HCL 60 MG PO CPEP Oral Take 60 mg by mouth daily.      Marland Kitchen FLUTICASONE PROPIONATE  HFA 110 MCG/ACT IN AERO Inhalation Inhale 1 puff into the lungs 2 (two) times daily.      Marland Kitchen LORATADINE 10 MG PO TABS Oral Take 10 mg by mouth daily.      . LUBIPROSTONE 24 MCG PO CAPS Oral Take 24 mcg by mouth daily.     Marland Kitchen METOPROLOL TARTRATE 50 MG PO TABS       . OLMESARTAN-AMLODIPINE-HCTZ 40-5-12.5 MG PO TABS Oral Take 1 tablet by mouth daily.      Marland Kitchen PRAVASTATIN SODIUM 20 MG PO TABS Oral Take 20 mg by mouth daily.      Marland Kitchen PROMETHAZINE HCL 25 MG PO  TABS Oral Take 25 mg by mouth every 6 (six) hours as needed.      Marland Kitchen VITAMIN D (ERGOCALCIFEROL) 50000 UNITS PO CAPS Oral Take 50,000 Units by mouth every 7 (seven) days. Every saturday     . CLOTRIMAZOLE 1 % EX CREA Topical Apply 1 application topically as needed. For under the breasts     . OMEPRAZOLE MAGNESIUM 20 MG PO TBEC Oral Take 20 mg by mouth daily. Only as needed      BP 133/66  Pulse 87  Temp(Src) 97.7 F (36.5 C) (Oral)  Resp 14  Ht 5\' 1"  (1.549 m)  Wt 255 lb (115.667 kg)  BMI 48.18 kg/m2  SpO2 100%  Vital signs normal  Physical Exam  Nursing note and vitals reviewed. Constitutional: She is oriented to person, place, and time. She appears well-developed and well-nourished.  Non-toxic appearance. She does not appear ill. No distress.  HENT:  Head: Normocephalic and atraumatic.  Right Ear: External ear normal.  Left Ear: External ear normal.  Nose: Nose normal. No mucosal edema or rhinorrhea.  Mouth/Throat: Oropharynx is clear and moist and mucous membranes are normal. No dental abscesses or uvula  swelling.  Eyes: Conjunctivae and EOM are normal. Pupils are equal, round, and reactive to light.       She is noted to have pronounced nystagymus. When I cover and uncover her eyes she is able to maintain looking at a fixed object.  Neck: Normal range of motion and full passive range of motion without pain. Neck supple.  Cardiovascular: Normal rate, regular rhythm and normal heart sounds.  Exam reveals no gallop and no friction rub.   No murmur heard. Pulmonary/Chest: Effort normal and breath sounds normal. No respiratory distress. She has no wheezes. She has no rhonchi. She has no rales. She exhibits no tenderness and no crepitus.  Abdominal: Soft. Normal appearance and bowel sounds are normal. She exhibits no distension. There is no tenderness. There is no rebound and no guarding.  Musculoskeletal: Normal range of motion. She exhibits no edema and no tenderness.       Moves all extremities well.   Neurological: She is alert and oriented to person, place, and time. She has normal strength. No cranial nerve deficit.  Skin: Skin is warm, dry and intact. No rash noted. No erythema. No pallor.  Psychiatric: Her speech is normal and behavior is normal. Her mood appears not anxious.       Affect is flat    ED Course  Procedures (including critical care time) Patient given IV fluid bolus, IV Zofran 4 mg IV, Reglan 10 mg IV, Benadryl 25 mg IV. Patient was able to take in oral meclizine after her nausea medicine.  Recheck at 2200 patient states her nausea is better but mildly present. She relates her dizziness is better. However when I examined her she still noted to have  horizontal nystagmus. Patient is able now to have CT scan.  00:20 after IV phenergan she has minimal nystagmus, but still has some dizziness and is very sedated. Pt needs a MRI of her brain  Results for orders placed during the hospital encounter of 07/17/11  CBC      Component Value Range   WBC 7.6  4.0 - 10.5 (K/uL)   RBC  4.20  3.87 - 5.11 (MIL/uL)   Hemoglobin 12.3  12.0 - 15.0 (g/dL)   HCT 16.1  09.6 - 04.5 (%)   MCV 89.8  78.0 - 100.0 (fL)  MCH 29.3  26.0 - 34.0 (pg)   MCHC 32.6  30.0 - 36.0 (g/dL)   RDW 16.1  09.6 - 04.5 (%)   Platelets 267  150 - 400 (K/uL)  DIFFERENTIAL      Component Value Range   Neutrophils Relative 69  43 - 77 (%)   Neutro Abs 5.2  1.7 - 7.7 (K/uL)   Lymphocytes Relative 27  12 - 46 (%)   Lymphs Abs 2.1  0.7 - 4.0 (K/uL)   Monocytes Relative 4  3 - 12 (%)   Monocytes Absolute 0.3  0.1 - 1.0 (K/uL)   Eosinophils Relative 0  0 - 5 (%)   Eosinophils Absolute 0.0  0.0 - 0.7 (K/uL)   Basophils Relative 0  0 - 1 (%)   Basophils Absolute 0.0  0.0 - 0.1 (K/uL)  COMPREHENSIVE METABOLIC PANEL      Component Value Range   Sodium 138  135 - 145 (mEq/L)   Potassium 3.4 (*) 3.5 - 5.1 (mEq/L)   Chloride 101  96 - 112 (mEq/L)   CO2 30  19 - 32 (mEq/L)   Glucose, Bld 117 (*) 70 - 99 (mg/dL)   BUN 16  6 - 23 (mg/dL)   Creatinine, Ser 4.09  0.50 - 1.10 (mg/dL)   Calcium 8.8  8.4 - 81.1 (mg/dL)   Total Protein 6.6  6.0 - 8.3 (g/dL)   Albumin 3.0 (*) 3.5 - 5.2 (g/dL)   AST 15  0 - 37 (U/L)   ALT 18  0 - 35 (U/L)   Alkaline Phosphatase 92  39 - 117 (U/L)   Total Bilirubin 0.4  0.3 - 1.2 (mg/dL)   GFR calc non Af Amer >90  >90 (mL/min)   GFR calc Af Amer >90  >90 (mL/min)  APTT      Component Value Range   aPTT 31  24 - 37 (seconds)  PROTIME-INR      Component Value Range   Prothrombin Time 13.1  11.6 - 15.2 (seconds)   INR 0.97  0.00 - 1.49    Laboratory interpretation all normal with mild hypokalemia  No results found.   Date: 07/17/2011  Rate: 85  Rhythm: normal sinus rhythm  QRS Axis: normal  Intervals: normal  ST/T Wave abnormalities: nonspecific T wave changes  Conduction Disutrbances:none  Narrative Interpretation:   Old EKG Reviewed: unchanged from 06/08/2008 except rate was 117  Ct Head Wo Contrast  07/17/2011  *RADIOLOGY REPORT*  Clinical Data: Nausea and  vomiting.  CT HEAD WITHOUT CONTRAST  Technique:  Contiguous axial images were obtained from the base of the skull through the vertex without contrast.  Comparison: None.  Findings: Ventricle size is normal.  Negative for intracranial hemorrhage.  Mild chronic microvascular ischemia in the white matter.  Negative for acute infarct or mass.  Calvarium is intact.  IMPRESSION: No acute abnormality.  Original Report Authenticated By: Camelia Phenes, M.D.     Diagnoses that have been ruled out:  Diagnoses that are still under consideration:  Final diagnoses:  Nystagmus  Vertigo  Nausea and vomiting  Plan admit  Devoria Albe, MD, FACEP     MDM          Ward Givens, MD 07/18/11 1201

## 2011-07-17 NOTE — ED Notes (Signed)
MD at bedside. 

## 2011-07-18 ENCOUNTER — Encounter (HOSPITAL_COMMUNITY): Payer: Self-pay | Admitting: Internal Medicine

## 2011-07-18 ENCOUNTER — Emergency Department (HOSPITAL_COMMUNITY): Payer: Medicare Other

## 2011-07-18 DIAGNOSIS — H5509 Other forms of nystagmus: Secondary | ICD-10-CM | POA: Diagnosis present

## 2011-07-18 DIAGNOSIS — C8590 Non-Hodgkin lymphoma, unspecified, unspecified site: Secondary | ICD-10-CM | POA: Insufficient documentation

## 2011-07-18 DIAGNOSIS — R112 Nausea with vomiting, unspecified: Secondary | ICD-10-CM | POA: Diagnosis present

## 2011-07-18 DIAGNOSIS — Z8639 Personal history of other endocrine, nutritional and metabolic disease: Secondary | ICD-10-CM | POA: Diagnosis present

## 2011-07-18 DIAGNOSIS — I471 Supraventricular tachycardia: Secondary | ICD-10-CM | POA: Insufficient documentation

## 2011-07-18 DIAGNOSIS — M549 Dorsalgia, unspecified: Secondary | ICD-10-CM | POA: Diagnosis present

## 2011-07-18 DIAGNOSIS — E669 Obesity, unspecified: Secondary | ICD-10-CM | POA: Diagnosis present

## 2011-07-18 DIAGNOSIS — G8929 Other chronic pain: Secondary | ICD-10-CM | POA: Diagnosis present

## 2011-07-18 DIAGNOSIS — I10 Essential (primary) hypertension: Secondary | ICD-10-CM | POA: Diagnosis present

## 2011-07-18 DIAGNOSIS — M8000XA Age-related osteoporosis with current pathological fracture, unspecified site, initial encounter for fracture: Secondary | ICD-10-CM | POA: Insufficient documentation

## 2011-07-18 DIAGNOSIS — F329 Major depressive disorder, single episode, unspecified: Secondary | ICD-10-CM | POA: Insufficient documentation

## 2011-07-18 DIAGNOSIS — F32A Depression, unspecified: Secondary | ICD-10-CM | POA: Insufficient documentation

## 2011-07-18 LAB — CREATININE, SERUM: GFR calc non Af Amer: 87 mL/min — ABNORMAL LOW (ref >90)

## 2011-07-18 LAB — CBC
HCT: 37.9 % (ref 36.0–46.0)
MCHC: 31.9 g/dL (ref 30.0–36.0)
RDW: 13.2 % (ref 11.5–15.5)

## 2011-07-18 MED ORDER — PROMETHAZINE HCL 25 MG PO TABS
25.0000 mg | ORAL_TABLET | Freq: Four times a day (QID) | ORAL | Status: DC | PRN
  Filled 2011-07-18: qty 1

## 2011-07-18 MED ORDER — SIMVASTATIN 10 MG PO TABS
10.0000 mg | ORAL_TABLET | Freq: Every day | ORAL | Status: DC
  Administered 2011-07-19 – 2011-07-22 (×4): 10 mg via ORAL
  Filled 2011-07-18 (×5): qty 1

## 2011-07-18 MED ORDER — DOCUSATE SODIUM 100 MG PO CAPS
100.0000 mg | ORAL_CAPSULE | Freq: Two times a day (BID) | ORAL | Status: DC
  Administered 2011-07-18 – 2011-07-22 (×8): 100 mg via ORAL
  Filled 2011-07-18 (×9): qty 1

## 2011-07-18 MED ORDER — HEPARIN SODIUM (PORCINE) 5000 UNIT/ML IJ SOLN
5000.0000 [IU] | Freq: Three times a day (TID) | INTRAMUSCULAR | Status: DC
  Filled 2011-07-18 (×4): qty 1

## 2011-07-18 MED ORDER — LORATADINE 10 MG PO TABS
10.0000 mg | ORAL_TABLET | Freq: Every day | ORAL | Status: DC
  Administered 2011-07-19 – 2011-07-22 (×4): 10 mg via ORAL
  Filled 2011-07-18 (×5): qty 1

## 2011-07-18 MED ORDER — CLOTRIMAZOLE 1 % EX CREA
1.0000 "application " | TOPICAL_CREAM | Freq: Two times a day (BID) | CUTANEOUS | Status: DC
  Administered 2011-07-19 – 2011-07-22 (×7): 1 via TOPICAL
  Filled 2011-07-18: qty 15

## 2011-07-18 MED ORDER — PANTOPRAZOLE SODIUM 40 MG PO TBEC
40.0000 mg | DELAYED_RELEASE_TABLET | Freq: Every day | ORAL | Status: DC
  Administered 2011-07-20 – 2011-07-22 (×3): 40 mg via ORAL
  Filled 2011-07-18 (×3): qty 1

## 2011-07-18 MED ORDER — PROMETHAZINE HCL 25 MG PO TABS: 25.0000 mg | ORAL_TABLET | Freq: Four times a day (QID) | ORAL | Status: DC | PRN

## 2011-07-18 MED ORDER — OLMESARTAN-AMLODIPINE-HCTZ 40-5-12.5 MG PO TABS: 1.0000 | ORAL_TABLET | Freq: Every day | ORAL | Status: DC

## 2011-07-18 MED ORDER — METOPROLOL TARTRATE 50 MG PO TABS
50.0000 mg | ORAL_TABLET | Freq: Two times a day (BID) | ORAL | Status: DC
  Administered 2011-07-18 – 2011-07-22 (×8): 50 mg via ORAL
  Filled 2011-07-18 (×9): qty 1

## 2011-07-18 MED ORDER — PROMETHAZINE HCL 25 MG/ML IJ SOLN
12.5000 mg | Freq: Four times a day (QID) | INTRAMUSCULAR | Status: DC | PRN
  Administered 2011-07-18 – 2011-07-20 (×4): 12.5 mg via INTRAVENOUS
  Filled 2011-07-18 (×6): qty 1

## 2011-07-18 MED ORDER — OMEPRAZOLE MAGNESIUM 20 MG PO TBEC: 20.0000 mg | DELAYED_RELEASE_TABLET | Freq: Every day | ORAL | Status: DC

## 2011-07-18 MED ORDER — OLMESARTAN MEDOXOMIL 40 MG PO TABS
40.0000 mg | ORAL_TABLET | Freq: Every day | ORAL | Status: DC
  Administered 2011-07-19 – 2011-07-22 (×4): 40 mg via ORAL
  Filled 2011-07-18 (×5): qty 1

## 2011-07-18 MED ORDER — AMLODIPINE BESYLATE 5 MG PO TABS
5.0000 mg | ORAL_TABLET | Freq: Every day | ORAL | Status: DC
  Administered 2011-07-19 – 2011-07-22 (×4): 5 mg via ORAL
  Filled 2011-07-18 (×5): qty 1

## 2011-07-18 MED ORDER — TRAMADOL HCL 50 MG PO TABS
50.0000 mg | ORAL_TABLET | Freq: Two times a day (BID) | ORAL | Status: DC | PRN
  Administered 2011-07-18: 50 mg via ORAL
  Filled 2011-07-18: qty 1

## 2011-07-18 MED ORDER — SODIUM CHLORIDE 0.9 % IV SOLN: INTRAVENOUS | Status: DC

## 2011-07-18 MED ORDER — POTASSIUM CHLORIDE IN NACL 20-0.9 MEQ/L-% IV SOLN
INTRAVENOUS | Status: DC
  Administered 2011-07-18: 75 mL/h via INTRAVENOUS
  Administered 2011-07-18 – 2011-07-20 (×2): via INTRAVENOUS
  Filled 2011-07-18 (×7): qty 1000

## 2011-07-18 MED ORDER — HYDROCHLOROTHIAZIDE 12.5 MG PO CAPS
12.5000 mg | ORAL_CAPSULE | Freq: Every day | ORAL | Status: DC
  Administered 2011-07-19 – 2011-07-22 (×4): 12.5 mg via ORAL
  Filled 2011-07-18 (×5): qty 1

## 2011-07-18 MED ORDER — GADOBENATE DIMEGLUMINE 529 MG/ML IV SOLN
20.0000 mL | Freq: Once | INTRAVENOUS | Status: AC | PRN
  Administered 2011-07-18: 20 mL via INTRAVENOUS

## 2011-07-18 MED ORDER — ACETAMINOPHEN 500 MG PO TABS
500.0000 mg | ORAL_TABLET | Freq: Four times a day (QID) | ORAL | Status: DC | PRN
  Administered 2011-07-18 – 2011-07-20 (×6): 500 mg via ORAL
  Filled 2011-07-18 (×3): qty 1

## 2011-07-18 MED ORDER — HEPARIN SODIUM (PORCINE) 5000 UNIT/ML IJ SOLN
5000.0000 [IU] | Freq: Three times a day (TID) | INTRAMUSCULAR | Status: DC
  Administered 2011-07-18 – 2011-07-22 (×13): 5000 [IU] via SUBCUTANEOUS
  Filled 2011-07-18 (×16): qty 1

## 2011-07-18 MED ORDER — FLUTICASONE PROPIONATE HFA 110 MCG/ACT IN AERO
1.0000 | INHALATION_SPRAY | Freq: Two times a day (BID) | RESPIRATORY_TRACT | Status: DC
  Administered 2011-07-18 – 2011-07-22 (×8): 1 via RESPIRATORY_TRACT
  Filled 2011-07-18 (×2): qty 12

## 2011-07-18 MED ORDER — ALBUTEROL SULFATE HFA 108 (90 BASE) MCG/ACT IN AERS: 2.0000 | INHALATION_SPRAY | Freq: Four times a day (QID) | RESPIRATORY_TRACT | Status: DC | PRN

## 2011-07-18 MED ORDER — ALUM & MAG HYDROXIDE-SIMETH 200-200-20 MG/5ML PO SUSP
30.0000 mL | Freq: Four times a day (QID) | ORAL | Status: DC | PRN
  Filled 2011-07-18: qty 30

## 2011-07-18 MED ORDER — DULOXETINE HCL 60 MG PO CPEP
60.0000 mg | ORAL_CAPSULE | Freq: Every day | ORAL | Status: DC
  Administered 2011-07-20 – 2011-07-22 (×3): 60 mg via ORAL
  Filled 2011-07-18 (×5): qty 1

## 2011-07-18 NOTE — ED Notes (Signed)
Report given to [facility]  

## 2011-07-18 NOTE — ED Notes (Signed)
Pt c/o nausea.  

## 2011-07-18 NOTE — ED Notes (Signed)
Paged Dr.Osborne covering for Dr. Kevan Ny to notify him of MRI results.

## 2011-07-18 NOTE — ED Notes (Signed)
Pt returned from MRI at this time

## 2011-07-18 NOTE — ED Notes (Addendum)
Dr.Osborne paged again at this time.

## 2011-07-18 NOTE — Progress Notes (Signed)
1600  Pt transferred from [facility] ED after 24hr c/o of n/v & headache, MRI/CT showed CVA, has no deficits, Full Code, tele NSR, 02 2lpm, a/o, neuro cks WNL,no h/a now but light hurts eyes, NPO with ice chips only, IV site lt a/c with NS&K+ @ 75cc/hr infusing, skin intact, oriented to unit & equipment, bedrest ordered.  Plan: npo, continue to monitor, hydrate as NPO, monitor labs & VS, progress till improved for discharge back home with husband.  Bonney Leitz RN

## 2011-07-18 NOTE — ED Notes (Signed)
Pt back from MRI. Monitor placed on pt with rate of 88.

## 2011-07-18 NOTE — H&P (Addendum)
Hospital Admission Note Date: 07/18/2011  Patient name: LARAY RIVKIN Medical record number: [id number] Date of birth: [birth year] Age: 70 y.o. Gender: female PCP: Dr. Ivery Quale  Attending physician:   Dr. Pearla Dubonnet  Chief Complaint:  Intractable nausea and vomiting and nystagmus  History of Present Illness: Mrs. Sherrye Puga is a very pleasant 70 year old female who developed severe nausea and vomiting yesterday approximately midday. The physician on call for [facility] called in Phenergan suppositories and they were not helpful. She has continued to have nausea and vomiting. She states that she suddenly felt seasick yesterday.  . She has lateral nystagmus in the emergency room and it is worrisome for acute CVA versus severe labyrinthitis. CT scan of the head is not acute but MRI will be necessary. She can move all her extremities but feels too unsteady to walk. She is comfortable lying flat supine. She is conversive and alert with easily recognizable lateral nystagmus. She denies any abdominal pain or chest pain or shortness of breath. She's not having double vision or blurred vision    Past Medical History  Diagnosis Date  . SVT (supraventricular tachycardia) / atrial fibrillation - currently in normal sinus rhythm    . Asthma     hx of  . Hypertension   . Chronic constipation   . Obesity   . Depression     hx of  . 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 Lymphoma - lumbar spine. Treated by Dr. Aliene Altes at [facility] - apparently disease-free and is being followed every 6 months to one year     Meds: Prior to Admission medications   Medication Sig Start Date End Date Taking? Authorizing Provider  acetaminophen (TYLENOL EX ST ARTHRITIS PAIN) 500 MG tablet Take 1,000 mg by mouth every 6 (six) hours as needed.    Yes Historical Provider, MD  albuterol (PROVENTIL HFA;VENTOLIN HFA) 108 (90 BASE) MCG/ACT inhaler  Inhale 2 puffs into the lungs every 6 (six) hours as needed. wheezing    Yes Historical Provider, MD  DULoxetine (CYMBALTA) 60 MG capsule Take 60 mg by mouth daily.     Yes Historical Provider, MD  fluticasone (FLOVENT HFA) 110 MCG/ACT inhaler Inhale 1 puff into the lungs 2 (two) times daily.     Yes Historical Provider, MD  loratadine (CLARITIN) 10 MG tablet Take 10 mg by mouth daily.     Yes Historical Provider, MD  lubiprostone (AMITIZA) 24 MCG capsule Take 24 mcg by mouth daily.    Yes Historical Provider, MD  metoprolol (LOPRESSOR) 50 MG tablet   06/16/11  Yes Rosalio Macadamia, NP  Olmesartan-Amlodipine-HCTZ (TRIBENZOR) 40-5-12.5 MG TABS Take 1 tablet by mouth daily.     Yes Historical Provider, MD  pravastatin (PRAVACHOL) 20 MG tablet Take 20 mg by mouth daily.     Yes Historical Provider, MD  promethazine (PHENERGAN) 25 MG tablet Take 25 mg by mouth every 6 (six) hours as needed.     Yes Historical Provider, MD  Vitamin D, Ergocalciferol, (DRISDOL) 50000 UNITS CAPS Take 50,000 Units by mouth every 7 (seven) days. Every saturday    Yes Historical Provider, MD  clotrimazole (LOTRIMIN) 1 % cream Apply 1 application topically as needed. For under the breasts     Historical Provider, MD  omeprazole (PRILOSEC OTC) 20 MG tablet Take 20 mg by mouth daily. Only as needed    Historical Provider, MD   Allergies: No  Known Allergies History   Social History  . Marital Status: Married    Spouse Name: N/A    Number of Children: 5  . Years of Education: N/A   Occupational History  . Not on file.   Social History Main Topics  . Smoking status: Never Smoker   . Smokeless tobacco: Not on file  . Alcohol Use: No  . Drug Use: No  . Sexually Active: Not on file   Other Topics Concern  . Not on file   Social History Narrative  .  normally lives alone. Supportive family. Daughter present in the ER and very helpful with history et Karie Soda. Has 5 daughters    Family History  Problem Relation Age  of Onset  . Lymphoma Mother   . Breast cancer Mother   . Coronary artery disease Father 22    died  . Stroke Father    Past Surgical History  Procedure Date  . Back surgery - Dr. Danielle Dess, multilevel fusion  2009  . Bone biopsy - lower lumbar spine, positive for lymphoma    . Replacement total knee     right  . Cesarean section - x2  Bilateral cataract extraction      Review of Systems:  Constitutional: negative for anorexia, chills, fevers and sweats Eyes: negative for visual disturbance Respiratory: negative for cough, pleurisy/chest pain and wheezing Cardiovascular: negative for chest pressure/discomfort, dyspnea, irregular heart beat and palpitations Gastrointestinal: positive for vomiting Genitourinary:negative for dysuria and frequency Musculoskeletal:negative for muscle pain Neurological: Denies headache or focal weakness. Positive for dizziness and nystagmus clinically Behavioral/Psych: negative for depression  Physical Exam: Blood pressure 131/51, pulse 84, temperature 97.7 F (36.5 C), temperature source Oral, resp. rate 15, height 5\' 1"  (1.549 m), weight 115.667 kg (255 lb), SpO2 98.00%.  General Appearance: Alert, cooperative, no distress, appears stated age. Was sleeping but easily arousable. Conversive Head: Normocephalic, without obvious abnormality, atraumatic. She does have lateral nystagmus  Which is mild but present. Quick phase appears to be to the left. Extraocular movements otherwise are intact. She has had bilateral cataract extractions and a IOLs  Neck: Supple, symmetrical, trachea midline, no adenopathy; thyroid: No enlargement/tenderness/nodules; no carotid bruit or JVD Lungs: Clear to auscultation bilaterally, respirations unlabored Back: Well-healed surgical wounds from extensive spinal fusion with rod placement  Heart: Regular rate and rhythm, S1 and S2 normal, no murmur, rub or gallop Abdomen: Soft,  obese, non-tender, bowel sounds active all four  quadrants, no masses, no organomegaly Extremities: Extremities normal, atraumatic, no cyanosis or edema. Well-healed scar over the right anterior knee  Pulses: 2+ and symmetric all extremities Skin: Skin color, texture, turgor normal, no rashes or lesions Neuro: CNII-XII intact except for lateral nystagmus bilaterally . Normal strength, sensation and reflexes throughout patient feels dizzy and feels too unstable to walk currently   Lab results: Results for orders placed during the hospital encounter of 07/17/11  CBC      Component Value Range   WBC 7.6  4.0 - 10.5 (K/uL)   RBC 4.20  3.87 - 5.11 (MIL/uL)   Hemoglobin 12.3  12.0 - 15.0 (g/dL)   HCT 16.1  09.6 - 04.5 (%)   MCV 89.8  78.0 - 100.0 (fL)   MCH 29.3  26.0 - 34.0 (pg)   MCHC 32.6  30.0 - 36.0 (g/dL)   RDW 40.9  81.1 - 91.4 (%)   Platelets 267  150 - 400 (K/uL)  DIFFERENTIAL      Component Value Range  Neutrophils Relative 69  43 - 77 (%)   Neutro Abs 5.2  1.7 - 7.7 (K/uL)   Lymphocytes Relative 27  12 - 46 (%)   Lymphs Abs 2.1  0.7 - 4.0 (K/uL)   Monocytes Relative 4  3 - 12 (%)   Monocytes Absolute 0.3  0.1 - 1.0 (K/uL)   Eosinophils Relative 0  0 - 5 (%)   Eosinophils Absolute 0.0  0.0 - 0.7 (K/uL)   Basophils Relative 0  0 - 1 (%)   Basophils Absolute 0.0  0.0 - 0.1 (K/uL)  COMPREHENSIVE METABOLIC PANEL      Component Value Range   Sodium 138  135 - 145 (mEq/L)   Potassium 3.4 (*) 3.5 - 5.1 (mEq/L)   Chloride 101  96 - 112 (mEq/L)   CO2 30  19 - 32 (mEq/L)   Glucose, Bld 117 (*) 70 - 99 (mg/dL)   BUN 16  6 - 23 (mg/dL)   Creatinine, Ser 1.61  0.50 - 1.10 (mg/dL)   Calcium 8.8  8.4 - 09.6 (mg/dL)   Total Protein 6.6  6.0 - 8.3 (g/dL)   Albumin 3.0 (*) 3.5 - 5.2 (g/dL)   AST 15  0 - 37 (U/L)   ALT 18  0 - 35 (U/L)   Alkaline Phosphatase 92  39 - 117 (U/L)   Total Bilirubin 0.4  0.3 - 1.2 (mg/dL)   GFR calc non Af Amer >90  >90 (mL/min)   GFR calc Af Amer >90  >90 (mL/min)  APTT      Component Value Range    aPTT 31  24 - 37 (seconds)  PROTIME-INR      Component Value Range   Prothrombin Time 13.1  11.6 - 15.2 (seconds)   INR 0.97  0.00 - 1.49   URINALYSIS, ROUTINE W REFLEX MICROSCOPIC      Component Value Range   Color, Urine YELLOW  YELLOW    APPearance CLOUDY (*) CLEAR    Specific Gravity, Urine 1.023  1.005 - 1.030    pH 6.0  5.0 - 8.0    Glucose, UA NEGATIVE  NEGATIVE (mg/dL)   Hgb urine dipstick NEGATIVE  NEGATIVE    Bilirubin Urine NEGATIVE  NEGATIVE    Ketones, ur NEGATIVE  NEGATIVE (mg/dL)   Protein, ur NEGATIVE  NEGATIVE (mg/dL)   Urobilinogen, UA 0.2  0.0 - 1.0 (mg/dL)   Nitrite POSITIVE (*) NEGATIVE    Leukocytes, UA NEGATIVE  NEGATIVE   URINE MICROSCOPIC-ADD ON      Component Value Range   Squamous Epithelial / LPF RARE  RARE    WBC, UA 0-2  <3 (WBC/hpf)   Bacteria, UA MANY (*) RARE     Imaging results:  Ct Head Wo Contrast  07/17/2011  *RADIOLOGY REPORT*  Clinical Data: Nausea and vomiting.  CT HEAD WITHOUT CONTRAST  Technique:  Contiguous axial images were obtained from the base of the skull through the vertex without contrast.  Comparison: None.  Findings: Ventricle size is normal.  Negative for intracranial hemorrhage.  Mild chronic microvascular ischemia in the white matter.  Negative for acute infarct or mass.  Calvarium is intact.  IMPRESSION: No acute abnormality.  Original Report Authenticated By: Camelia Phenes, M.D.    Assessment & Plan:     Very pleasant 70 year old female with acute nausea and vomiting and lateral nystagmus. Rule out CVA with MRI. Could be labyrinthitis, viral. Need to keep in mind that she does have a history of  lymphoma.  No history of coronary artery disease or other vascular disease       Patient Active Problem List  Diagnoses     . Nausea and vomiting - likely related to her nystagmus which may be from CVA versus labyrinthitis. Continue Phenergan 25 mg IV when necessary and consider oral meclizine therapy when necessary if found to  be labyrinthitis   . Lateral nystagmus - rule out CVA with MRI. Has history of lymphoma and CNS lymphoma should be ruled out as well with MRI   . Lymphoma in remission - aware   . Hypertension - continue to follow   . Obesity - aware   . SVT (supraventricular tachycardia) / atrial fibrillation - currently not problematic. Patient in normal sinus rhythm   . Depression - currently, mood is good   . History of hypokalemia - borderline. Replace IV for now   . Back pain, chronic - aware. Has had compression fractures, bone biopsies and rod placement for extensive back fusion lumbar and thoracic   . Osteoporosis with pathological fracture - history of      Pedro Oldenburg NEVILL 07/18/2011, 4:10 AM

## 2011-07-18 NOTE — Consult Note (Signed)
NEUROLOGY STROKE/TIA CONSULT NOTE  HPI:  [name] is a 70 y.o. female with history of non-Hodgkin B-cell lymphoma in remission, hypertension, and hyperlipidemia who presents with 36 hours of vertigo, nausea and emesis. Patient reports she awoke yesterday morning at about 6:30am with vertigo at rest and nausea. This worsened over the course of the day and was accompanied by multiple episodes of emesis. She presented to the [facility] ED and was found to have vertigo, nystagmus, and nausea that did not appear to be positional or fatiguable. As a result, a CT head and, ultimately, and MRI brain were performed. MRI demonstrated a mildly diffusion positive region of the left pons corresponding to changes in the [facility] and other modalities and consistent with an acute or subacute pontine infarct. Patient has no previous history of stroke. She denies other symptoms of weakness, numbness, changes in speech, swallowing or gait. She does endorse some vertical diplopia that is binocular but says this is not currently present. Of note, her daughter reports multiple friends and family members have very recently had a "GI illness" of nausea and vomiting with vertigo and nystagmus. Their first thought yesterday was the patient was developing the same infectious problem.   Past Medical History  Diagnosis Date  . SVT (supraventricular tachycardia)   . Asthma     hx of  . Hypertension   . Chronic constipation   . Obesity   . Depression     hx of  . Back pain   . Lymphoma     Past Surgical History  Procedure Date  . Back surgery 2009  . Bone biopsy   . Replacement total knee     right  . Cesarean section   . Joint replacement   . Eye surgery    No Known Allergies  (Not in a hospital admission)  Prior to Admission medications   Medication Sig Start Date End Date Taking? Authorizing Provider  DULoxetine (CYMBALTA) 60 MG capsule Take 60 mg by mouth daily.     Yes Historical Provider, MD  fluticasone  (FLOVENT HFA) 110 MCG/ACT inhaler Inhale 1 puff into the lungs 2 (two) times daily.     Yes Historical Provider, MD  loratadine (CLARITIN) 10 MG tablet Take 10 mg by mouth daily.     Yes Historical Provider, MD  promethazine (PHENERGAN) 25 MG tablet Take 25 mg by mouth every 6 (six) hours as needed.     Yes Historical Provider, MD  acetaminophen (TYLENOL EX ST ARTHRITIS PAIN) 500 MG tablet Take 1,000 mg by mouth every 6 (six) hours as needed.     Historical Provider, MD  albuterol (PROVENTIL HFA;VENTOLIN HFA) 108 (90 BASE) MCG/ACT inhaler Inhale 2 puffs into the lungs every 6 (six) hours as needed. wheezing     Historical Provider, MD  clotrimazole (LOTRIMIN) 1 % cream Apply 1 application topically as needed. For under the breasts     Historical Provider, MD  lubiprostone (AMITIZA) 24 MCG capsule Take 24 mcg by mouth daily.     Historical Provider, MD  metoprolol (LOPRESSOR) 50 MG tablet   06/16/11   Rosalio Macadamia, NP  Olmesartan-Amlodipine-HCTZ (TRIBENZOR) 40-5-12.5 MG TABS Take 1 tablet by mouth daily.      Historical Provider, MD  omeprazole (PRILOSEC OTC) 20 MG tablet Take 20 mg by mouth daily. Only as needed    Historical Provider, MD  pravastatin (PRAVACHOL) 20 MG tablet Take 20 mg by mouth daily.      Historical Provider,  MD  Vitamin D, Ergocalciferol, (DRISDOL) 50000 UNITS CAPS Take 50,000 Units by mouth every 7 (seven) days. Every saturday     Historical Provider, MD    Family History  Problem Relation Age of Onset  . Lymphoma Mother   . Breast cancer Mother   . Coronary artery disease Father 51    died  . Stroke Father    Social History: No history of tobacco, EtOH or drug use. Currently works at a daycare facility.  Review of Systems:   A ten system review of systems was obtained and was negative except as stated above.   Physical Exam: BP 154/68  Pulse 85  Temp(Src) 98.1 F (36.7 C) (Oral)  Resp 18  Ht 5\' 1"  (1.549 m)  Wt 115.667 kg (255 lb)  BMI 48.18 kg/m2  SpO2  97% GENERAL:   Well nourished, well hydrated, no acute distress.   ENT:       Mouth:  Good dentition.      Throat:  Oropharynx clear. No lymphadenopathy.  CARDIOVASCULAR:   Regular rate and rhythm, no thrills or palpable murmurs, S1, S2, no murmur, no rubs or gallops.      Carotid arteries: No carotid bruits.   RESPIRATORY:  Mild end-expiratory wheezes.  ABDOMEN:   Soft, non-tender, non-distended, bowel sounds present, no rebound or guarding  EXTREMITIES:  No rashes or lesions     No peripheral edema, cyanosis, or clubbing   MENTAL STATUS EXAM:    Orientation:  Alert and oriented to person, place and time.      Memory:  Cooperative, follows commands well.  Recent and remote memory normal.      Attention, concentration:  Attention span and concentration are normal.      Language:  Speech is clear and language is normal.      Fund of knowledge:  Aware of current events, vocabulary appropriate for patient age.   CRANIAL NERVES:     CN 2 (Optic):  Visual fields intact to confrontation, funduscopic examination without optic disk pallor or edema, retinal vessels are normal.      CN 3,4,6 (EOM):  Pupils equal and reactive to light and near full eye movement. Denies current diplopia. Torsional nystagmus noted and most prominent with right gaze. Does not fatigue or resolve.     CN 5 (Trigeminal):  Facial sensation is normal, no weakness of masticatory muscles.      CN 7 (Facial):  No facial weakness or asymmetry.      CN 8 (Auditory):  Auditory acuity grossly normal.      CN 9,10 (Glossophar):  The uvula is midline, the palate elevates symmetrically.      CN 11 (spinal access):  Normal sternocleidomastoid and trapezius strength.      CN 12 (Hypoglossal):  The tongue is midline. No atrophy or fasciculations.   MOTOR:    Neck flexors   (R): 5  (L): 5        Neck extensors  (R): 5  (L): 5     Deltoids:            (R): 5  (L): 5      Biceps:                       (R): 5  (L): 5      Triceps:                        (R):  5  (L): 5      Wrist Extensors:         (R): 5  (L): 5      Wrist Flexors:      (R): 5  (L): 5      Flexor Pollicis Longus:      (R): 5  (L): 5      Adductor Digiti Minimi:       (R): 5  (L): 5      Abductor Pollicis Brevis:   (R): 5  (L): 5      Hip Flexors:                       (R): 5  (L): 5      Quadriceps:                       (R): 5  (L): 5      Hamstrings:                       (R): 5  (L): 5      Tibialis Anterior:                 (R): 5  (L): 5      Medial Gastrocnemius:     (R): 5  (L): 5      Extensor Hallucis              (R): 5  (L): 5  Muscle Tone: Tone and muscle bulk are normal in the upper and lower extremities.  There are no fasciculations.   REFLEXES:     Triceps:                 (R): 2+  (L): 2+      Biceps:                  (R): 2+  (L): 2+      Brachioradialis:     (R): 2+  (L): 2+      Patellar:                 (R): 1+  (L): 1+      Achilles:                 (R): 1+  (L): 1+      Hoffman:    (R): absent  (L): absent      Babinski:    (R): absent  (L): absent   COORDINATION:     Intact finger-to-nose, heel-to-shin, and rapid alternating movements. No tremor.   SENSATION:     Bilateral decrease in sensation to vibration in legs in a symmetric and length-dependent pattern.   GAIT:     Routine and tandem gait are normal.  NIHSS Score: 1 (for recent diplopia) Labs: Results for orders placed during the hospital encounter of 07/17/11 (from the past 24 hour(s))  CBC     Status: Normal   Collection Time   07/17/11  8:45 PM      Component Value Range   WBC 7.6  4.0 - 10.5 (K/uL)   RBC 4.20  3.87 - 5.11 (MIL/uL)   Hemoglobin 12.3  12.0 - 15.0 (g/dL)   HCT 16.1  09.6 - 04.5 (%)   MCV 89.8  78.0 - 100.0 (fL)   MCH 29.3  26.0 - 34.0 (pg)   MCHC 32.6  30.0 - 36.0 (g/dL)  RDW 12.9  11.5 - 15.5 (%)   Platelets 267  150 - 400 (K/uL)  DIFFERENTIAL     Status: Normal   Collection Time   07/17/11  8:45 PM      Component Value Range    Neutrophils Relative 69  43 - 77 (%)   Neutro Abs 5.2  1.7 - 7.7 (K/uL)   Lymphocytes Relative 27  12 - 46 (%)   Lymphs Abs 2.1  0.7 - 4.0 (K/uL)   Monocytes Relative 4  3 - 12 (%)   Monocytes Absolute 0.3  0.1 - 1.0 (K/uL)   Eosinophils Relative 0  0 - 5 (%)   Eosinophils Absolute 0.0  0.0 - 0.7 (K/uL)   Basophils Relative 0  0 - 1 (%)   Basophils Absolute 0.0  0.0 - 0.1 (K/uL)  COMPREHENSIVE METABOLIC PANEL     Status: Abnormal   Collection Time   07/17/11  8:45 PM      Component Value Range   Sodium 138  135 - 145 (mEq/L)   Potassium 3.4 (*) 3.5 - 5.1 (mEq/L)   Chloride 101  96 - 112 (mEq/L)   CO2 30  19 - 32 (mEq/L)   Glucose, Bld 117 (*) 70 - 99 (mg/dL)   BUN 16  6 - 23 (mg/dL)   Creatinine, Ser 1.19  0.50 - 1.10 (mg/dL)   Calcium 8.8  8.4 - 14.7 (mg/dL)   Total Protein 6.6  6.0 - 8.3 (g/dL)   Albumin 3.0 (*) 3.5 - 5.2 (g/dL)   AST 15  0 - 37 (U/L)   ALT 18  0 - 35 (U/L)   Alkaline Phosphatase 92  39 - 117 (U/L)   Total Bilirubin 0.4  0.3 - 1.2 (mg/dL)   GFR calc non Af Amer >90  >90 (mL/min)   GFR calc Af Amer >90  >90 (mL/min)  APTT     Status: Normal   Collection Time   07/17/11  8:45 PM      Component Value Range   aPTT 31  24 - 37 (seconds)  PROTIME-INR     Status: Normal   Collection Time   07/17/11  8:45 PM      Component Value Range   Prothrombin Time 13.1  11.6 - 15.2 (seconds)   INR 0.97  0.00 - 1.49   URINALYSIS, ROUTINE W REFLEX MICROSCOPIC     Status: Abnormal   Collection Time   07/17/11 10:50 PM      Component Value Range   Color, Urine YELLOW  YELLOW    APPearance CLOUDY (*) CLEAR    Specific Gravity, Urine 1.023  1.005 - 1.030    pH 6.0  5.0 - 8.0    Glucose, UA NEGATIVE  NEGATIVE (mg/dL)   Hgb urine dipstick NEGATIVE  NEGATIVE    Bilirubin Urine NEGATIVE  NEGATIVE    Ketones, ur NEGATIVE  NEGATIVE (mg/dL)   Protein, ur NEGATIVE  NEGATIVE (mg/dL)   Urobilinogen, UA 0.2  0.0 - 1.0 (mg/dL)   Nitrite POSITIVE (*) NEGATIVE    Leukocytes, UA  NEGATIVE  NEGATIVE   URINE MICROSCOPIC-ADD ON     Status: Abnormal   Collection Time   07/17/11 10:50 PM      Component Value Range   Squamous Epithelial / LPF RARE  RARE    WBC, UA 0-2  <3 (WBC/hpf)   Bacteria, UA MANY (*) RARE   CBC     Status: Normal   Collection Time  07/18/11  5:45 AM      Component Value Range   WBC 9.2  4.0 - 10.5 (K/uL)   RBC 4.10  3.87 - 5.11 (MIL/uL)   Hemoglobin 12.1  12.0 - 15.0 (g/dL)   HCT 14.7  82.9 - 56.2 (%)   MCV 92.4  78.0 - 100.0 (fL)   MCH 29.5  26.0 - 34.0 (pg)   MCHC 31.9  30.0 - 36.0 (g/dL)   RDW 13.0  86.5 - 78.4 (%)   Platelets 273  150 - 400 (K/uL)  CREATININE, SERUM     Status: Abnormal   Collection Time   07/18/11  5:45 AM      Component Value Range   Creatinine, Ser 0.65  0.50 - 1.10 (mg/dL)   GFR calc non Af Amer 87 (*) >90 (mL/min)   GFR calc Af Amer >90  >90 (mL/min)   Diagnostic Studies:   1) CT head 07/17/11- No acute abnormality  2) MRI brain 07/18/11 Findings: Acute/subacute small left paracentral pontine infarct. No intracranial hemorrhage. Small left vertebral artery. Ectatic left vertebral artery and basilar artery. Major intracranial vascular structures are patent. Mild white matter type changes noted on this motion degraded exam probably related to result of small vessel disease. No hydrocephalus. Partially empty sella.  IMPRESSION: Acute/subacute small left paracentral pontine infarct.  Assessment:  70y/o woman with history of non-Hodgkin B-cell lymphoma, hypertension and hyperlipidemia presenting with 36 hours of vertigo, nystagmus, nausea/vomiting, and diplopia in the setting of a left paracentral pontine abnormality on MRI. Diffusion sequence of MRI is not completely convincing for infarct but this region of abnormality is repeatedly seen on multiple sequences. Patient's symptoms appear relatively mild compared to findings on imaging. I discussed the plan at length with the patient and her two daughters. I also  reviewed the MRI imaging with the patient's daughter.  Plan: -Would recommend CTA head and neck to evaluate posterior circulation. -Monitor cardiac function on telemetry x 24 hrs. -Trans-thoracic echocardiogram with bubble study. -TCD and carotid ultrasound. -Recommend starting aspirin 325mg  daily for antiplatelet therapy. -Would risk stratify with HgbA1c, lipid profile, and TSH. -PRN antiemetics for nausea.  Thank you for this consultation. The neurology consult team will follow up tomorrow. Please page me with any further questions if needed.  Kipp Laurence, MD [facility] [phone] 07/18/11 14:26

## 2011-07-18 NOTE — ED Notes (Addendum)
Report called to Gigi Gin, RN on [id number]. [facility] to be called by [facility], [facility].

## 2011-07-18 NOTE — ED Notes (Signed)
Pt resting with eyes closed. Family at bedside. No acute distress.

## 2011-07-18 NOTE — ED Notes (Signed)
MD at bedside. 

## 2011-07-18 NOTE — ED Notes (Signed)
Patient transported to MRI 

## 2011-07-18 NOTE — ED Provider Notes (Signed)
  Physical Exam  BP 125/51  Pulse 82  Temp(Src) 97.7 F (36.5 C) (Oral)  Resp 17  Ht 5\' 1"  (1.549 m)  Wt 255 lb (115.667 kg)  BMI 48.18 kg/m2  SpO2 97%  Physical Exam  ED Course  Procedures  MDM Patient received at change of shift, persistent nausea and dizziness with vertigo and nystagmus. Discussed with Dr. Kevan Ny who will come to admit the patient.      Vida Roller, MD 07/18/11 [phone]

## 2011-07-18 NOTE — ED Notes (Signed)
Dr. Felipa Eth states pt will be transferred to [facility].

## 2011-07-18 NOTE — ED Notes (Addendum)
Dr. Felipa Eth contacted regarding pt, made aware of MRI report. States he will consult neuro.

## 2011-07-18 NOTE — ED Notes (Signed)
Assumed care of patient at this time

## 2011-07-18 NOTE — ED Notes (Signed)
Pt transferred to [facility] via [facility] with chart, meds and personal belongings, condition stable at time of transfer.

## 2011-07-18 NOTE — ED Notes (Signed)
Awaiting [facility] to pick pt up, will monitor.

## 2011-07-19 ENCOUNTER — Other Ambulatory Visit: Payer: Self-pay

## 2011-07-19 LAB — URINE CULTURE
Colony Count: >=100000
Culture  Setup Time: 201212160246

## 2011-07-19 LAB — URINALYSIS, ROUTINE W REFLEX MICROSCOPIC
Bilirubin Urine: NEGATIVE
Specific Gravity, Urine: 1.026 (ref 1.005–1.030)
Urobilinogen, UA: 1 mg/dL (ref 0.0–1.0)
pH: 5.5 (ref 5.0–8.0)

## 2011-07-19 LAB — URINE MICROSCOPIC-ADD ON

## 2011-07-19 LAB — LIPID PANEL
Cholesterol: 191 mg/dL (ref 0–200)
Triglycerides: 223 mg/dL — ABNORMAL HIGH (ref <150)

## 2011-07-19 MED ORDER — MELOXICAM 15 MG PO TABS
15.0000 mg | ORAL_TABLET | Freq: Every day | ORAL | Status: DC
  Administered 2011-07-19 – 2011-07-22 (×4): 15 mg via ORAL
  Filled 2011-07-19 (×4): qty 1

## 2011-07-19 MED ORDER — SALINE SPRAY 0.65 % NA SOLN
1.0000 | NASAL | Status: DC | PRN
  Administered 2011-07-20: 1 via NASAL
  Filled 2011-07-19: qty 44

## 2011-07-19 MED ORDER — PNEUMOCOCCAL VAC POLYVALENT 25 MCG/0.5ML IJ INJ
0.5000 mL | INJECTION | INTRAMUSCULAR | Status: AC
  Administered 2011-07-20: 0.5 mL via INTRAMUSCULAR
  Filled 2011-07-19: qty 0.5

## 2011-07-19 NOTE — Progress Notes (Signed)
Physical Therapy Evaluation Patient Details Name: [name] MRN: [id number] DOB: Jan 17, 1941 Today's Date: 07/19/2011  Problem List:  Patient Active Problem List  Diagnoses  . DISORDER OF BONE AND CARTILAGE UNSPECIFIED  . Nausea and vomiting  . Lateral nystagmus  . Lymphoma in remission  . Hypertension  . Obesity  . SVT (supraventricular tachycardia)  . Depression  . History of hypokalemia  . Back pain, chronic  . Osteoporosis with pathological fracture    Past Medical History:  Past Medical History  Diagnosis Date  . SVT (supraventricular tachycardia)   . Asthma     hx of  . Hypertension   . Chronic constipation   . Obesity   . Depression     hx of  . Back pain   . Lymphoma    Past Surgical History:  Past Surgical History  Procedure Date  . Back surgery 2009  . Bone biopsy   . Replacement total knee     right  . Cesarean section   . Joint replacement   . Eye surgery     PT Assessment/Plan/Recommendation PT Assessment Clinical Impression Statement: Pt. with right beating nystagmus, dizziness and nausea limiting mobility following a left pontine stroke.  Pt. requiring increased time with mobility to allow nausea and dizziness to level off while focusing on a fixed object.  Pt.'s baseline level of dizziness and nausea today was 4/10 in sidelying,  6/10 EOB and 8/10 with standing.  Pt.  and pt.'s daugter were both educated about the process of habituation to her dizziness and nausea.  Discussed possibility of CIR depending on progression.   PT Recommendation/Assessment: Patient will need skilled PT in the acute care venue PT Problem List: Decreased activity tolerance;Decreased mobility;Other (comment);Pain (Dizziness and nausea. ) Barriers to Discharge: None PT Therapy Diagnosis : Difficulty walking PT Plan PT Frequency: Min 4X/week PT Treatment/Interventions: Gait training;Functional mobility training;Therapeutic activities;Patient/family education PT  Recommendation Recommendations for Other Services: OT consult Follow Up Recommendations: Inpatient Rehab Equipment Recommended: Defer to next venue PT Goals  Acute Rehab PT Goals PT Goal Formulation: With patient/family Time For Goal Achievement: 2 weeks Pt will Stand: with supervision;6 - 10 min;Other (comment) (With dizziness less than 4/10) PT Goal: Stand - Progress: Other (comment) Pt will Ambulate: >150 feet;with supervision;with least restrictive assistive device;Other (comment) (with dizziness less than 4/10) PT Goal: Ambulate - Progress: Other (comment) Additional Goals Additional Goal #1: Patient will independently perform all bed mobility with dizziness rated at less than 4/10 PT Goal: Additional Goal #1 - Progress: Other (comment) Additional Goal #2: Patient will independently perform basic transfers with dizziness rated at less than 4/10 PT Goal: Additional Goal #2 - Progress: Other (comment)  PT Evaluation Precautions/Restrictions  Precautions Precautions: Fall Prior Functioning  Home Living Lives With: Spouse Type of Home: House Home Layout: One level Home Access: Ramped entrance Bathroom Shower/Tub: Walk-in shower;Door [employer] Toilet: Standard Home Adaptive Equipment: Shower chair with back;Bedside commode/3-in-1;Walker - rolling;Straight cane Prior Function Level of Independence: Independent with basic ADLs;Independent with transfers;Independent with gait;Independent with homemaking with ambulation Driving: Yes Vocation: Part time employment Comments: 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 states slight difference between toes left vs. right) Extremity Assessment RLE Assessment RLE Assessment: Within Functional Limits LLE Assessment LLE Assessment:  Within Functional Limits Mobility (including Balance) Bed Mobility Bed Mobility: Yes Rolling Left: 6: Modified  independent (Device/Increase time);With rail (HOB 15 degrees) Left Sidelying to Sit: 6: Modified independent (Device/Increase time);With rails;HOB elevated (comment degrees) (15 degrees) Sitting - Scoot to Edge of Bed: 6: Modified independent (Device/Increase time) Sit to Supine - Left: 6: Modified independent (Device/Increase time);HOB flat Transfers Transfers: Yes Sit to Stand: With upper extremity assist;From bed;4: Min assist Sit to Stand Details (indicate cue type and reason): 2 person hand-held assist in order to stand and maintain fixed gaze on target, as patient with increased nausea and dizziness with movement.  Stand to Sit: 4: Min assist;To bed;With upper extremity assist Stand to Sit Details: 2 person hand-held assist to sit.  Patient feeling as though she would be sick after standing for one minute. Ambulation/Gait Ambulation/Gait: No  Balance Balance Assessed: Yes Static Sitting Balance Static Sitting - Balance Support: Bilateral upper extremity supported;Feet supported Static Sitting - Level of Assistance: 6: Modified independent (Device/Increase time) Static Sitting - Comment/# of Minutes: Patient sat EOB 20 minutes while focusing on light switches.  Patient did note three episodes of vertical diplopia lasting 50 seconds at one time.  Dizziness was rated at a 6 initially, increasing to 8 and then back to 6 before pt. laid back down in bed. Pt. with a right and downward rotating nystagmus when focusing on light switches.  Exercise    End of Session PT - End of Session Activity Tolerance: Patient tolerated treatment well;Other (comment) (Dizziness and nausea throughout) Patient left: in bed;with call bell in reach;with family/visitor present Nurse Communication: Other (comment) (Bed mobility status and nausea/dizziness) General Behavior During Session: [facility] for  tasks performed Cognition: [facility] for tasks performed  Laney Pastor, SPT  07/19/2011, 5:33 PM

## 2011-07-19 NOTE — Progress Notes (Signed)
  Echocardiogram 2D Echocardiogram has been performed.  [name] 07/19/2011, 2:38 PM

## 2011-07-19 NOTE — Progress Notes (Signed)
PRELIMINARY  PRELIMINARY  PRELIMINARY  PRELIMINARY  Carotid Duplex completed.    Preliminary report:  Bilateral:  No evidence of hemodynamically significant internal carotid artery stenosis.  Vertebral artery flow is antegrade.     Terance Hart, RVT 07/19/2011 12:53 PM

## 2011-07-19 NOTE — Progress Notes (Signed)
Stroke Team Progress Note  SUBJECTIVE  [name] is a 70 y.o. female history of non-Hodgkin B-cell lymphoma in remission, hypertension, and hyperlipidemia who presents with 36 hours of vertigo, nausea and emesis. Patient reports she awoke Saturday morning at about 6:30am with vertigo at rest and nausea. This worsened over the course of the day and was accompanied by multiple episodes of emesis. She presented to the [facility] ED and was found to have vertigo, nystagmus, and nausea that did not appear to be positional or fatiguable. As a result, a CT head and, ultimately, and MRI brain were performed. MRI demonstrated a mildly diffusion positive region of the left pons corresponding to changes in the [facility] and other modalities and consistent with an acute or subacute pontine infarct. Patient has no previous history of stroke. She denies other symptoms of weakness, numbness, changes in speech, swallowing or gait. She does endorse some vertical diplopia that is binocular but says this is not currently present. Of note, her daughter reports multiple friends and family members have very recently had a "GI illness" of nausea and vomiting with vertigo and nystagmus. Their first thought yesterday was the patient was developing the same infectious problem.   Her daughter is at the bedside. Overall she feels her condition is gradually improving. She has a history of presumed statin intolerance, though she stopped it primarily due to what she heard others say, not what she personally experienced.  OBJECTIVE Most recent Vital Signs: Temp: 97.6 F (36.4 C) (12/17 0523) Temp src: Oral (12/17 0523) BP: 124/72 mmHg (12/17 0523) Pulse Rate: 61  (12/17 0523) Respiratory Rate: 20 O2 Saturdation: 97%  CBG (last 3)  No results found for this basename: GLUCAP:3 in the last 72 hours Intake/Output from previous day: 12/16 0701 - 12/17 0700 In: 750 [I.V.:750] Out: -   IV Fluid Intake:     . 0.9 % NaCl with KCl 20 mEq / L  75 mL/hr at 07/18/11 2327   Diet:    NPO except ice chips Activity:  Bedrest DVT Prophylaxis:  Heparin 5000 units sq tid   Studies: CBC    Component Value Date/Time   WBC 9.2 07/18/2011 0545   WBC 5.3 11/18/2008 1132   RBC 4.10 07/18/2011 0545   RBC 3.69* 11/18/2008 1132   HGB 12.1 07/18/2011 0545   HGB 11.2* 11/18/2008 1132   HCT 37.9 07/18/2011 0545   HCT 33.6* 11/18/2008 1132   PLT 273 07/18/2011 0545   PLT 235 11/18/2008 1132   MCV 92.4 07/18/2011 0545   MCV 91.2 11/18/2008 1132   MCH 29.5 07/18/2011 0545   MCH 30.3 11/18/2008 1132   MCHC 31.9 07/18/2011 0545   MCHC 33.3 11/18/2008 1132   RDW 13.2 07/18/2011 0545   RDW 14.7* 11/18/2008 1132   LYMPHSABS 2.1 07/17/2011 2045   LYMPHSABS 0.5* 11/18/2008 1132   MONOABS 0.3 07/17/2011 2045   MONOABS 0.0* 11/18/2008 1132   EOSABS 0.0 07/17/2011 2045   EOSABS 0.5 11/18/2008 1132   BASOSABS 0.0 07/17/2011 2045   BASOSABS 0.0 11/18/2008 1132   CMP    Component Value Date/Time   NA 138 07/17/2011 2045   K 3.4* 07/17/2011 2045   CL 101 07/17/2011 2045   CO2 30 07/17/2011 2045   GLUCOSE 117* 07/17/2011 2045   BUN 16 07/17/2011 2045   CREATININE 0.65 07/18/2011 0545   CALCIUM 8.8 07/17/2011 2045   PROT 6.6 07/17/2011 2045   ALBUMIN 3.0* 07/17/2011 2045   AST 15 07/17/2011 2045  ALT 18 07/17/2011 2045   ALKPHOS 92 07/17/2011 2045   BILITOT 0.4 07/17/2011 2045   GFRNONAA 87* 07/18/2011 0545   GFRAA >90 07/18/2011 0545   COAGS Lab Results  Component Value Date   INR 0.97 07/17/2011   INR 1.0 05/10/2008   INR 1.0 04/26/2008   Lipid Panel No results found for this basename: chol, trig, hdl, cholhdl, vldl, ldlcalc   HgbA1C  Lab Results  Component Value Date   HGBA1C  Value: 5.7 (NOTE)   The ADA recommends the following therapeutic goal for glycemic   control related to Hgb A1C measurement:   Goal of Therapy:   < 7.0% Hgb A1C   Reference: American Diabetes Association: Clinical Practice   Recommendations 2008, Diabetes Care,   2008, 31:(Suppl 1). 06/05/2008   Urine Drug Screen  No results found for this basename: labopia, cocainscrnur, labbenz, amphetmu, thcu, labbarb    Alcohol Level No results found for this basename: eth   CT of the brain  No acute abnormality.   CT angio  Not ordered   MRI of the brain   Acute/subacute small left paracentral pontine infarct.   MRA of the brain  Not ordered   2D Echocardiogram  Not ordered   Carotid Doppler  Not ordered   CXR  Not ordered   EKG  normal sinus rhythm, nonspecific T waves changes.   Physical Exam   Pleasant elderly Caucasian lady currently not in distress. Afebrile. Cardiac exam regular heart sounds soft ejection murmur. Lungs clear to auscultation. No pedal edema. Distal pulses are felt.  Neurological exam :Awake alert oriented x 3 normal speech and language.I. Movements are full range with right lateral gaze evoked nystagmus. Subjective diplopia on right lateral gaze only. Mild left lower face asymmetry. Tongue midline. No drift. Mild diminished fine finger movements on left. Orbits right over left upper extremity. Mild left grip weak.. Normal sensation .mild left finger-to-nose impaired coordination. Gait not done ASSESSMENT [name] is a 70 y.o. female with a small left pontine infarct, secondary to small vessel disease. Not on aspirin or other antiplatelets for secondary stroke prevention. Will do complete stroke workup. Therapy evals.  Stroke risk factors:  family history, hypertension and obesity  Hospital day # 2  TREATMENT/PLAN Add  aspirin 325 mg orally every day for secondary stroke prevention. PT, OT, ST evaluations. NPO until ST assesses pt; diet per ST. 2D echo, carotid dopplers, TCD. Check lipids, HgbA1c.   Joaquin Music, ANP-BC, GNP-BC [facility] Pager: [phone] 07/19/2011 8:38 AM  Dr. Delia Heady, Stroke Center Medical Director, has personally reviewed chart, pertinent data, examined the patient and  developed the plan of care.

## 2011-07-19 NOTE — Progress Notes (Signed)
Subjective: She is having less nausea today, however she is having a moderate headache is bifrontal in location.  Generally, she has a fairly heavy caffeine intake on a daily basis and has had no caffeine today.  She is having less vertigo today, although she has not tried to get out of bed today.  She denies shortness of breath, palpitations, vomiting, or abdominal pain.  Objective: Vital signs in last 24 hours: Temp:  [97.6 F (36.4 C)-98.1 F (36.7 C)] 97.9 F (36.6 C) (12/17 1328) Pulse Rate:  [61-77] 61  (12/17 1328) Resp:  [19-20] 19  (12/17 1328) BP: (124-157)/(72-84) 157/81 mmHg (12/17 1328) SpO2:  [94 %-98 %] 98 % (12/17 1328) Weight change:    Intake/Output from previous day: 12/16 0701 - 12/17 0700 In: 750 [I.V.:750] Out: -    General appearance: alert, cooperative and no distress Eyes: pupils are equal round and reactive to light and accommodation, there was mild bilateral lateral nystagmus Resp: clear to auscultation bilaterally Cardio: regular rate and rhythm, S1, S2 normal, no murmur, click, rub or gallop Extremities: extremities normal, atraumatic, no cyanosis or edema Neurologic: Alert and oriented X 3, normal strength and tone. Normal symmetric reflexes. Normal coordination and gait Mental status: Alert, oriented, thought content appropriate  Lab Results:  Basename 07/18/11 0545 07/17/11 2045  WBC 9.2 7.6  HGB 12.1 12.3  HCT 37.9 37.7  PLT 273 267   BMET  Basename 07/18/11 0545 07/17/11 2045  NA -- 138  K -- 3.4*  CL -- 101  CO2 -- 30  GLUCOSE -- 117*  BUN -- 16  CREATININE 0.65 0.55  CALCIUM -- 8.8   CMET CMP     Component Value Date/Time   NA 138 07/17/2011 2045   K 3.4* 07/17/2011 2045   CL 101 07/17/2011 2045   CO2 30 07/17/2011 2045   GLUCOSE 117* 07/17/2011 2045   BUN 16 07/17/2011 2045   CREATININE 0.65 07/18/2011 0545   CALCIUM 8.8 07/17/2011 2045   PROT 6.6 07/17/2011 2045   ALBUMIN 3.0* 07/17/2011 2045   AST 15 07/17/2011  2045   ALT 18 07/17/2011 2045   ALKPHOS 92 07/17/2011 2045   BILITOT 0.4 07/17/2011 2045   GFRNONAA 87* 07/18/2011 0545   GFRAA >90 07/18/2011 0545    CBG (last 3)  No results found for this basename: GLUCAP:3 in the last 72 hours  INR RESULTS:   Lab Results  Component Value Date   INR 0.97 07/17/2011   INR 1.0 05/10/2008   INR 1.0 04/26/2008     Studies/Results: Ct Head Wo Contrast  07/17/2011  *RADIOLOGY REPORT*  Clinical Data: Nausea and vomiting.  CT HEAD WITHOUT CONTRAST  Technique:  Contiguous axial images were obtained from the base of the skull through the vertex without contrast.  Comparison: None.  Findings: Ventricle size is normal.  Negative for intracranial hemorrhage.  Mild chronic microvascular ischemia in the white matter.  Negative for acute infarct or mass.  Calvarium is intact.  IMPRESSION: No acute abnormality.  Original Report Authenticated By: Camelia Phenes, M.D.   Mr Laqueta Jean Wo Contrast  07/18/2011  *RADIOLOGY REPORT*  Clinical Data: Lateral nystagmus with nausea and vomiting.  Rule out stroke.  MRI HEAD WITHOUT AND WITH CONTRAST  Technique:  Multiplanar, multiecho pulse sequences of the brain and surrounding structures were obtained according to standard protocol without and with intravenous contrast  Contrast: 20mL MULTIHANCE GADOBENATE DIMEGLUMINE 529 MG/ML IV SOLN  Comparison: 07/17/2011 CT.  No comparison  MR.  Findings: Acute/subacute small left paracentral pontine infarct.  No intracranial hemorrhage.  Small left vertebral artery.  Ectatic left vertebral artery and basilar artery.  Major intracranial vascular structures are patent.  Mild white matter type changes noted on this motion degraded exam probably related to result of small vessel disease.  No hydrocephalus.  Minimal mucosal thickening mastoid air cells.  Minimal polypoid opacification left maxillary sinus.  Minimal mucosal thickening ethmoid sinus air cells.  No intracranial mass or abnormal  enhancement.  Partially empty sella.  This may be an incidental finding although also described in patients with pseudotumor cerebri.  Mild degenerative changes with erosion of the dens.  IMPRESSION: Acute/subacute small left paracentral pontine infarct.  Original Report Authenticated By: Fuller Canada, M.D.    Medications: I have reviewed the patient's current medications.  Assessment/Plan: #1 Nystagmus:  Slowly improving on current Rx. We will continue PT and OT rehab. She may need CIR rehabilitation, depending on how she does with rehabilitation. Further stroke workup testing has been ordered by the consultant neurologist.  #2 Headache:  Most likely from acute caffeine withdrawal and less likely from her stroke or meds. I will add NSAIDs to her regimen.  #3 Hypokalemia: mild and we will recheck potassium prior to eventual discharge.  LOS: 2 days   Korayma Hagwood G 07/19/2011, 6:58 PM

## 2011-07-19 NOTE — Progress Notes (Addendum)
Speech Language/Pathology Clinical/Bedside Swallow Evaluation Patient Details  Name: [name] MRN: [id number] DOB: [birth year] Today's Date: 07/19/2011  Past Medical History:  Past Medical History  Diagnosis Date  . SVT (supraventricular tachycardia)   . Asthma     hx of  . Hypertension   . Chronic constipation   . Obesity   . Depression     hx of  . Back pain   . Lymphoma    Past Surgical History:  Past Surgical History  Procedure Date  . Back surgery 2009  . Bone biopsy   . Replacement total knee     right  . Cesarean section   . Joint replacement   . Eye surgery    Pt is a 70 year old female admitted with nausea, vomiting and nystagmus.  Found to have left pontine cva. Pt witha history of asthma.  No complaints of previous difficutly swallowing.  Type of Study: Bedside swallow evaluation  Assessment/Recommendations/Treatment Plan    SLP Assessment Clinical Impression Statement: Pt with no overt or subtle signs of aspiration. Voice clear throughout assessment. Pt independently takes very small sips, even with a straw.  Feel pt is safe to initiate a regular/thin diet though SLP will monitor closely given pontine CVA and risk of silent aspiration.  Risk for Aspiration: Mild Other Related Risk Factors: Decreased respiratory status  Pt also screened for cognitive linguistic deficits, pt appears WFL.  Will defer full eval.  Please reorder if concerns arise. SLP will continue to see for swallowing.   Recommendations Solid Consistency: Regular Liquid Consistency: Thin Liquid Administration via: Cup;Straw Medication Administration: Whole meds with liquid Supervision: Intermittent supervision to cue for compensatory strategies Compensations: Slow rate;Small sips/bites Postural Changes and/or Swallow Maneuvers: Out of bed for meals;Seated upright 90 degrees Oral Care Recommendations: Oral care BID;Patient independent with oral care  Prognosis Prognosis for Safe Diet  Advancement: Good  Individuals Consulted Consulted and Agree with Results and Recommendations: Patient;Family member/caregiver Family Member Consulted: Daughter  Swallow Study Goals  SLP Swallowing Goals Patient will consume recommended diet without observed clinical signs of aspiration with: Modified independent assistance Swallow Study Goal #1 - Progress: Progressing toward goal Patient will utilize recommended strategies during swallow to increase swallowing safety with: Modified independent assistance Swallow Study Goal #2 - Progress: Progressing toward goal  Harlon Ditty, MA CCC-SLP [phone]  Jemari Hallum, Riley Nearing 07/19/2011,12:31 PM

## 2011-07-19 NOTE — Progress Notes (Signed)
Seen and agree with SPT note Miroslav Gin Tabor, PT [phone]  

## 2011-07-20 DIAGNOSIS — I6330 Cerebral infarction due to thrombosis of unspecified cerebral artery: Secondary | ICD-10-CM

## 2011-07-20 DIAGNOSIS — I639 Cerebral infarction, unspecified: Secondary | ICD-10-CM | POA: Diagnosis present

## 2011-07-20 LAB — BASIC METABOLIC PANEL
CO2: 31 mEq/L (ref 19–32)
Chloride: 99 mEq/L (ref 96–112)
Creatinine, Ser: 0.64 mg/dL (ref 0.50–1.10)
GFR calc Af Amer: >90 mL/min (ref >90)
Sodium: 136 mEq/L (ref 135–145)

## 2011-07-20 MED ORDER — ACETAMINOPHEN 500 MG PO TABS
500.0000 mg | ORAL_TABLET | Freq: Four times a day (QID) | ORAL | Status: DC | PRN
  Administered 2011-07-20 – 2011-07-21 (×2): 500 mg via ORAL
  Filled 2011-07-20 (×2): qty 1

## 2011-07-20 MED ORDER — POTASSIUM CHLORIDE IN NACL 20-0.9 MEQ/L-% IV SOLN
INTRAVENOUS | Status: DC
  Administered 2011-07-21: 07:00:00 via INTRAVENOUS
  Filled 2011-07-20 (×2): qty 1000

## 2011-07-20 NOTE — Progress Notes (Signed)
Physical Therapy Treatment Patient Details Name: [name] MRN: [id number] DOB: 06-10-1941 Today's Date: 07/20/2011  PT Assessment/Plan  PT - Assessment/Plan Comments on Treatment Session: Pt. did very well during today's treatment session.  She was able to ambulate with a RW and supervision while maintaining her gaze on a fixed object with no increase in dizziness from her baseline (2/10).  Patient also performed x1 exercises as described below.  Pt. with one episode of vertical diplopia while resting between x1 exercises t hat was relieved by closing her eyes.  Pt. also with eye twitching bilaterally when closing her eyes.  Will continue to work on habituation exercises for pt.'s dizziness and nausea.  PT Plan: Discharge plan remains appropriate;Frequency remains appropriate PT Frequency: Min 4X/week Follow Up Recommendations: Inpatient Rehab Equipment Recommended: Defer to next venue PT Goals  Acute Rehab PT Goals PT Goal: Stand - Progress: Progressing toward goal Pt will Ambulate: >150 feet;with supervision;with least restrictive assistive device;Other (comment) (with dizziness less than 2/10) PT Goal: Ambulate - Progress: Revised (modified due to lack of progress/goal met) Additional Goals PT Goal: Additional Goal #1 - Progress: Progressing toward goal PT Goal: Additional Goal #2 - Progress: Progressing toward goal  PT Treatment Precautions/Restrictions  Precautions Precautions: Fall Restrictions Weight Bearing Restrictions: No Mobility (including Balance) Bed Mobility Bed Mobility: Yes Rolling Left: 6: Modified independent (Device/Increase time);With rail Left Sidelying to Sit: 6: Modified independent (Device/Increase time);With rails;HOB elevated (comment degrees) (25 degrees) Sitting - Scoot to Edge of Bed: 6: Modified independent (Device/Increase time) Sit to Supine - Left: 6: Modified independent (Device/Increase time);HOB flat Transfers Transfers: Yes Sit to Stand:  5: Supervision;From bed;From chair/3-in-1 Sit to Stand Details (indicate cue type and reason): Verbal cues for safe hand placement and to maintain fixed gaze. Stood once from 3-in-1 and twice from both bed and recliner.  Stand to Sit: 5: Supervision;To chair/3-in-1 Stand to Sit Details: Verbal cues for safe hand placement.  Sat once to 3-in-1 and bed and twice to recliner.  Ambulation/Gait Ambulation/Gait: Yes Ambulation/Gait Assistance: 4: Min assist Ambulation/Gait Assistance Details (indicate cue type and reason): Min guard assist with cues to walk inside RW and to maintain fixed gaze.  Ambulation Distance (Feet): 130 Feet (Pt. requiring seated rest x 2 minutes after 50 feet) Assistive device: Rolling walker Gait Pattern: Step-through pattern;Decreased stride length Stairs: No    Exercise  Other Exercises Other Exercises: Performed x1 exercises in sitting with object an arm's length away.  One trial vertical and three trials lateral. Patient with 2/10 dizziness at baseline instructed to perform exercises  until reaching 4/10 while keeping object in focus.  End of Session PT - End of Session Equipment Utilized During Treatment: Gait belt Activity Tolerance: Patient tolerated treatment well Patient left: in chair;with call bell in reach;with family/visitor present Nurse Communication: Mobility status for transfers;Mobility status for ambulation General Behavior During Session: [facility] for tasks performed Cognition: [facility] for tasks performed  Laney Pastor, SPT  07/20/2011, 4:15 PM

## 2011-07-20 NOTE — Progress Notes (Signed)
Speech Language/Pathology Speech Pathology: Dysphagia Treatment Note  Patient was observed with : Soft solids and Thin liquids.  Patient was noted to have s/s of aspiration : No:    Lung Sounds:  clear Temperature: afebrile  Patient required: Minimal cues to sit upright for POs. Pt reports pain with fully upright position and dtr reports she was reluctant to sit up during the night to take sips for pills.  The pt agreed to sit up to a 55 degree position for breakfast.  Pt independently stated, I dont think im supposed to use straws.  No s/s of aspiration observed with cup sips or larger straw sips.   cues to consistently follow precautions/strategies  Clinical Impression/Recommendations: Pt without any overt s/s of aspiration. Dtr and pt are aware of precautions and signs of aspiration.  Pt is able to continue current diet and does not require f/u at this time by SLP. Please reconsult if problems arise.    Pain:   none Intervention Required:   No   Goals: All Goals Met  Harlon Ditty, MA CCC-SLP [phone]

## 2011-07-20 NOTE — Plan of Care (Signed)
Problem: Phase II Progression Outcomes Goal: Neuro status stablized/improved Outcome: Progressing Speech clear; seen by speech therapy today andPT/ OT.

## 2011-07-20 NOTE — Progress Notes (Signed)
Vascular Lab  TCD completed.    Terance Hart, RVT 07/20/2011 12:49 PM

## 2011-07-20 NOTE — Progress Notes (Signed)
07/20/11 1900  Unable to take Simvastatin-nauseated-wants to take later and Donna,RN informed. Leandrew Koyanagi Zaia Carre,RN

## 2011-07-20 NOTE — Progress Notes (Signed)
Seen and agree with SPT note [name], PT [phone]  

## 2011-07-20 NOTE — Progress Notes (Signed)
Subjective: Today she has not had significant nausea and denies vertigo. Her headache is only mild now. She has not had any difficulty swallowing. She has been doing position changes in bed without difficulty. She has not been walking yet. She is seen by comprehensive inpatient rehabilitation physician who recommended outpatient rehabilitation.  Objective: Vital signs in last 24 hours: Temp:  [97.5 F (36.4 C)-97.9 F (36.6 C)] 97.5 F (36.4 C) (12/18 0540) Pulse Rate:  [59-76] 76  (12/18 1033) Resp:  [18-20] 18  (12/18 0540) BP: (124-157)/(71-90) 141/90 mmHg (12/18 1033) SpO2:  [92 %-98 %] 95 % (12/18 0819) Weight change:    Intake/Output from previous day: 12/17 0701 - 12/18 0700 In: 375 [I.V.:375] Out: -    General appearance: alert, cooperative and no distress Eyes: She had normal extraocular eye movements with no nystagmus Resp: clear to auscultation bilaterally Cardio: regular rate and rhythm, S1, S2 normal, no murmur, click, rub or gallop Extremities: extremities normal, atraumatic, no cyanosis or edema Neurologic: Mental status: Alert, oriented, thought content appropriate Motor: grossly normal  Lab Results:  Basename 07/18/11 0545 07/17/11 2045  WBC 9.2 7.6  HGB 12.1 12.3  HCT 37.9 37.7  PLT 273 267   BMET  Basename 07/20/11 0541 07/18/11 0545 07/17/11 2045  NA 136 -- 138  K 3.4* -- 3.4*  CL 99 -- 101  CO2 31 -- 30  GLUCOSE 96 -- 117*  BUN 10 -- 16  CREATININE 0.64 0.65 --  CALCIUM 8.6 -- 8.8   CMET CMP     Component Value Date/Time   NA 136 07/20/2011 0541   K 3.4* 07/20/2011 0541   CL 99 07/20/2011 0541   CO2 31 07/20/2011 0541   GLUCOSE 96 07/20/2011 0541   BUN 10 07/20/2011 0541   CREATININE 0.64 07/20/2011 0541   CALCIUM 8.6 07/20/2011 0541   PROT 6.6 07/17/2011 2045   ALBUMIN 3.0* 07/17/2011 2045   AST 15 07/17/2011 2045   ALT 18 07/17/2011 2045   ALKPHOS 92 07/17/2011 2045   BILITOT 0.4 07/17/2011 2045   GFRNONAA 88* 07/20/2011 0541     GFRAA >90 07/20/2011 0541    CBG (last 3)  No results found for this basename: GLUCAP:3 in the last 72 hours  INR RESULTS:   Lab Results  Component Value Date   INR 0.97 07/17/2011   INR 1.0 05/10/2008   INR 1.0 04/26/2008     Studies/Results: No results found.  Medications: I have reviewed the patient's current medications.  Assessment/Plan: #1 Acute Stroke: She has had significant improvement in her symptoms and a short period of time. She has undergone a partial workup for reversible causes of stroke that has been unremarkable. I will have her begin to ambulate on the ward with a walker and assistance every shift. If she does well with this then she should be able to discharge to home within the next 24-48 hours. #2 Headache: Much improved with Mobic, and most likely from caffeine withdrawal. #3 Hypertension: Improved with improvement in headache. #4 Hyperlipidemia: She has mild increase in LDL cholesterol and moderate increase in triglycerides. We will increase her pravastatin to 40 mg daily.  LOS: 3 days   Taige Housman G 07/20/2011, 11:43 AM

## 2011-07-20 NOTE — Progress Notes (Signed)
Rehab admissions - Evaluated for possible admission.  See rehab MD consult done today by Dr. Riley Kill recommending [facility] versus outpatient therapy follow-up.  Likely will not need in inpatient rehab stay.  Pager [phone]

## 2011-07-20 NOTE — Consult Note (Signed)
Physical Medicine and Rehabilitation Consult Reason for Consult:Stroke Referring Phsyician:  Dr. Eloise Harman    HPI: [name] is an 70 y.o. female with history of HTN, SVT, admitted 12/16 with intractable nausea and vomiting and noted to have lateral nystagmus on exam in ED. MRI brain with acute/subacute small left paracentral pontine infarct.  2D echo with EF 65-70% and no wall abnormality.  Carotid dopplers without ICA stenosis.  Evaluated by neurology who recommended full workup and ASA for CVA prophylaxis.  Patient's nausea and vertigo symptoms are improving but she continues with headaches.  PT eval done yesterday and  Patient with significant dizziness with sitting at EOB and standing attempts for one minute.  Review of Systems  HENT: Positive for hearing loss.   Eyes: Positive for double vision.  Respiratory: Negative for sputum production and shortness of breath.   Cardiovascular: Negative for chest pain and palpitations.  Gastrointestinal: Positive for constipation. Negative for nausea and vomiting.  Genitourinary: Negative for urgency and frequency.  Musculoskeletal: Positive for joint pain.  Neurological: Positive for dizziness, weakness and headaches. Negative for speech change and focal weakness.  Psychiatric/Behavioral: Negative for depression. The patient is not nervous/anxious.    Past Medical History  Diagnosis Date  . SVT (supraventricular tachycardia)   . Asthma     hx of  . Hypertension   . Chronic constipation   . Obesity   . Depression     hx of  . Back pain   . Lymphoma    Past Surgical History  Procedure Date  . Back surgery for lymphoma 2009  . Bone biopsy   . 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 Cesarean section   . Eye surgery for cataracts     Family History  Problem Relation Age of Onset  . Lymphoma Mother   . Breast cancer Mother   . Coronary artery disease Father 26    died  . Stroke Father    Social History:  Married  and works fulltime as Manufacturing systems engineer.  Shereports that she has never smoked. She does not have any smokeless tobacco history on file. She reports that she does not drink alcohol or use illicit drugs. Husband is retired and in good health.  Children live in the neighborhood and can assist past discharge.   No Known Allergies  Prior to Admission medications   Medication Sig Start Date End Date Taking? Authorizing Provider  loratadine (CLARITIN) 10 MG tablet Take 10 mg by mouth daily.     Yes Historical Provider, MD  promethazine (PHENERGAN) 25 MG tablet Take 25 mg by mouth every 6 (six) hours as needed.     Yes Historical Provider, MD  acetaminophen (TYLENOL EX ST ARTHRITIS PAIN) 500 MG tablet Take 1,000 mg by mouth every 6 (six) hours as needed.     Historical Provider, MD  clotrimazole (LOTRIMIN) 1 % cream Apply 1 application topically as needed. For under the breasts     Historical Provider, MD  lubiprostone (AMITIZA) 24 MCG capsule Take 24 mcg by mouth daily.     Historical Provider, MD  metoprolol (LOPRESSOR) 50 MG tablet  06/16/11   Rosalio Macadamia, NP  Olmesartan-Amlodipine-HCTZ (TRIBENZOR) 40-5-12.5 MG TABS Take 1 tablet by mouth daily.      Historical Provider, MD  omeprazole (PRILOSEC OTC) 20 MG tablet Take 20 mg by mouth daily. Only as needed    Historical Provider, MD  Vitamin D, Ergocalciferol, (DRISDOL) 50000  UNITS CAPS Take 50,000 Units by mouth every 7 (seven) days. Every saturday     Historical Provider, MD   Scheduled Medications:    . amLODipine  5 mg Oral Daily  . clotrimazole  1 application Topical BID  . docusate sodium  100 mg Oral BID  . DULoxetine  60 mg Oral Daily  . fluticasone  1 puff Inhalation BID  . heparin  5,000 Units Subcutaneous Q8H  . hydrochlorothiazide  12.5 mg Oral Daily  . loratadine  10 mg Oral Daily  . meloxicam  15 mg Oral Daily  . metoprolol  50 mg Oral BID  . olmesartan  40 mg Oral Daily  . pantoprazole  40 mg Oral Q1200  . pneumococcal 23  valent vaccine  0.5 mL Intramuscular Tomorrow-1000  . simvastatin  10 mg Oral q1800   PRN MED's: acetaminophen, albuterol, alum & mag hydroxide-simeth, promethazine, promethazine, promethazine, sodium chloride, traMADol  Home: Home Living Lives With: Spouse Type of Home: House Home Layout: One level Home Access: Ramped entrance Bathroom Shower/Tub: Walk-in shower;Door [employer] Toilet: Standard Home Adaptive Equipment: Shower chair with back;Bedside commode/3-in-1;Walker - rolling;Straight cane   Functional History: Prior Function Level of Independence: Independent with basic ADLs;Independent with transfers;Independent with gait;Independent with homemaking with ambulation Driving: Yes Vocation: Part time employment Comments: 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  Functional Status:  Mobility: Bed Mobility Bed Mobility: Yes Rolling Left: 6: Modified independent (Device/Increase time);With rail (HOB 15 degrees) Left Sidelying to Sit: 6: Modified independent (Device/Increase time);With rails;HOB elevated (comment degrees) (15 degrees) Sitting - Scoot to Edge of Bed: 6: Modified independent (Device/Increase time) Sit to Supine - Left: 6: Modified independent (Device/Increase time);HOB flat Transfers Transfers: Yes Sit to Stand: With upper extremity assist;From bed;4: Min assist Sit to Stand Details (indicate cue type and reason): 2 person hand-held assist in order to stand and maintain fixed gaze on target, as patient with increased nausea and dizziness with movement.  Stand to Sit: 4: Min assist;To bed;With upper extremity assist Stand to Sit Details: 2 person hand-held assist to sit.  Patient feeling as though she would be sick after standing for one minute. Ambulation/Gait Ambulation/Gait: No    ADL:    Cognition: Cognition Arousal/Alertness: Awake/alert Orientation Level: Oriented X4 Cognition Arousal/Alertness: Awake/alert Overall Cognitive Status:  Appears within functional limits for tasks assessed Orientation Level: Oriented X4  Blood pressure 154/84, pulse 59, temperature 97.5 F (36.4 C), temperature source Oral, resp. rate 18, height 5\' 1"  (1.549 m), weight 115.667 kg (255 lb), SpO2 94.00%. Physical Exam  Constitutional: She is oriented to person, place, and time. She appears well-developed and well-nourished.       Morbidly obese  Cardiovascular: Normal rate and regular rhythm.   Pulmonary/Chest: Effort normal and breath sounds normal.  Neurological: She is alert and oriented to person, place, and time.       Patient with 4-5 beats of nystagmus with lateral gaze in either direction as well as superior gaze. Did complain of some double vision. Visual fields were grossly intact. Remainder  cranial nerve exam was normal. Patient had intact fine motor coordination of the upper extremities and lower extremities today. Strength is grossly 4+ to 5 out of 5 in all 4 limbs. Cognitively she is intact and appropriate.  Psychiatric: She has a normal mood and affect. Her behavior is normal. Judgment and thought content normal.    Results for orders placed during the hospital encounter of 07/17/11 (from the past 24  hour(s))  LIPID PANEL     Status: Abnormal   Collection Time   07/19/11 11:04 AM      Component Value Range   Cholesterol 191  0 - 200 (mg/dL)   Triglycerides 161 (*) <150 (mg/dL)   HDL 35 (*) >09 (mg/dL)   Total CHOL/HDL Ratio 5.5     VLDL 45 (*) 0 - 40 (mg/dL)   LDL Cholesterol 604 (*) 0 - 99 (mg/dL)  HEMOGLOBIN V4U     Status: Abnormal   Collection Time   07/19/11 11:04 AM      Component Value Range   Hemoglobin A1C 6.2 (*) <5.7 (%)   Mean Plasma Glucose 131 (*) <117 (mg/dL)  BASIC METABOLIC PANEL     Status: Abnormal   Collection Time   07/20/11  5:41 AM      Component Value Range   Sodium 136  135 - 145 (mEq/L)   Potassium 3.4 (*) 3.5 - 5.1 (mEq/L)   Chloride 99  96 - 112 (mEq/L)   CO2 31  19 - 32 (mEq/L)    Glucose, Bld 96  70 - 99 (mg/dL)   BUN 10  6 - 23 (mg/dL)   Creatinine, Ser 9.81  0.50 - 1.10 (mg/dL)   Calcium 8.6  8.4 - 19.1 (mg/dL)   GFR calc non Af Amer 88 (*) >90 (mL/min)   GFR calc Af Amer >90  >90 (mL/min)   Mr Laqueta Jean Wo Contrast  07/18/2011  *RADIOLOGY REPORT*  Clinical Data: Lateral nystagmus with nausea and vomiting.  Rule out stroke.  MRI HEAD WITHOUT AND WITH CONTRAST  Technique:  Multiplanar, multiecho pulse sequences of the brain and surrounding structures were obtained according to standard protocol without and with intravenous contrast  Contrast: 20mL MULTIHANCE GADOBENATE DIMEGLUMINE 529 MG/ML IV SOLN  Comparison: 07/17/2011 CT.  No comparison MR.  Findings: Acute/subacute small left paracentral pontine infarct.  No intracranial hemorrhage.  Small left vertebral artery.  Ectatic left vertebral artery and basilar artery.  Major intracranial vascular structures are patent.  Mild white matter type changes noted on this motion degraded exam probably related to result of small vessel disease.  No hydrocephalus.  Minimal mucosal thickening mastoid air cells.  Minimal polypoid opacification left maxillary sinus.  Minimal mucosal thickening ethmoid sinus air cells.  No intracranial mass or abnormal enhancement.  Partially empty sella.  This may be an incidental finding although also described in patients with pseudotumor cerebri.  Mild degenerative changes with erosion of the dens.  IMPRESSION: Acute/subacute small left paracentral pontine infarct.  Original Report Authenticated By: Fuller Canada, M.D.    Assessment/Plan: Diagnosis: Small left paracentral pontine stroke 1. Does the need for close, 24 hr/day medical supervision in concert with the patient's rehab needs make it unreasonable for this patient to be served in a less intensive setting? Potentially 2. Co-Morbidities requiring supervision/potential complications: Hypertension, obesity, chronic low back pain 3. Due to bladder  management and bowel management, does the patient require 24 hr/day rehab nursing? Potentially 4. Does the patient require coordinated care of a physician, rehab nurse, PT and OT to address physical and functional deficits in the context of the above medical diagnosis(es)? No and Potentially Addressing deficits in the following areas: balance and locomotion 5. Can the patient actively participate in an intensive therapy program of at least 3 hrs of therapy per day at least 5 days per week? Yes 6. The potential for patient to make measurable gains while on inpatient rehab is  good and fair 7. Anticipated functional outcomes upon discharge from inpatients are modified ind PT, modified ind OT 8. Estimated rehab length of stay to reach the above functional goals is: To be determined 9. Does the patient have adequate social supports to accommodate these discharge functional goals? Yes 10. Anticipated D/C setting: Home 11. Anticipated post D/C treatments: Outpt therapy 12. Overall Rehab/Functional Prognosis: excellent  RECOMMENDATIONS: This patient's condition is appropriate for continued rehabilitative care in the following setting: [facility] or outpatient therapies Patient has agreed to participate in recommended program. Yes and Potentially Note that insurance prior authorization may be required for reimbursement for recommended care.  Comment: This is only hospital day 2. Patient showing neurological progress already. She has good social supports. She likely will be able to return home with home health versus outpatient rehabilitation. Rehabilitation nurse case manager to followup.  07/20/2011

## 2011-07-20 NOTE — Progress Notes (Signed)
Occupational Therapy Evaluation Patient Details Name: JAVONDA SUH MRN: [id number] DOB: 1940/12/31 Today's Date: 07/20/2011  Problem List:  Patient Active Problem List  Diagnoses  . DISORDER OF BONE AND CARTILAGE UNSPECIFIED  . Nausea and vomiting  . Lateral nystagmus  . Lymphoma in remission  . Hypertension  . Obesity  . SVT (supraventricular tachycardia)  . Depression  . History of hypokalemia  . Back pain, chronic  . Osteoporosis with pathological fracture  . Stroke    Past Medical History:  Past Medical History  Diagnosis Date  . SVT (supraventricular tachycardia)   . Asthma     hx of  . Hypertension   . Chronic constipation   . Obesity   . Depression     hx of  . Back pain   . Lymphoma    Past Surgical History:  Past Surgical History  Procedure Date  . Back surgery 2009  . Bone biopsy   . Replacement total knee     right  . Cesarean section   . Joint replacement   . Eye surgery     OT Assessment/Plan/Recommendation OT Assessment Clinical Impression Statement: This 70 y.o. female presents to OT s/p CVA.  Pt. demonstrates visual impairment, dizziness, and decreased balance.  Pt. was instructed in vision HEP.  Currently, pt requires mod-max assist for BADLs due to dizziness.  Feel pt. would benefit from OT to maximize safety and independence with BADLs to allow her to return to modified independence after CIR and continued OT OT Recommendation/Assessment: Patient will need skilled OT in the acute care venue OT Problem List: Decreased strength;Decreased activity tolerance;Impaired balance (sitting and/or standing);Impaired vision/perception;Decreased knowledge of use of DME or AE Barriers to Discharge: None OT Therapy Diagnosis : Generalized weakness;Disturbance of vision OT Plan OT Frequency: Min 2X/week OT Treatment/Interventions: Self-care/ADL training;DME and/or AE instruction;Therapeutic activities;Visual/perceptual  remediation/compensation;Patient/family education;Balance training OT Recommendation Recommendations for Other Services: Rehab consult Follow Up Recommendations: Inpatient Rehab Equipment Recommended: Defer to next venue Individuals Consulted Consulted and Agree with Results and Recommendations: Patient;Family member/caregiver OT Goals Acute Rehab OT Goals OT Goal Formulation: With patient Time For Goal Achievement: 2 weeks ADL Goals Pt Will Perform Grooming: with min assist (min guard assist) ADL Goal: Grooming - Progress: Not met Pt Will Perform Upper Body Bathing: with set-up;Unsupported;Sitting, chair ADL Goal: Upper Body Bathing - Progress: Not met Pt Will Perform Lower Body Bathing: with min assist;Sit to stand from bed;Sit to stand from chair (min guard assist) ADL Goal: Lower Body Bathing - Progress: Not met Pt Will Perform Upper Body Dressing: with set-up;Unsupported;Sitting, bed;Sitting, chair ADL Goal: Upper Body Dressing - Progress: Not met Pt Will Perform Lower Body Dressing: with min assist;Sit to stand from chair;Sit to stand from bed (min guard assist) ADL Goal: Lower Body Dressing - Progress: Not met Pt Will Transfer to Toilet: with min assist;Ambulation;Comfort height toilet (min guard assist) ADL Goal: Toilet Transfer - Progress: Not met Pt Will Perform Toileting - Clothing Manipulation: with min assist;Standing (min guard assist) ADL Goal: Toileting - Clothing Manipulation - Progress: Not met Pt Will Perform Tub/Shower Transfer: with min assist;Shower seat without back;Ambulation ADL Goal: Tub/Shower Transfer - Progress: Not met Additional ADL Goal #1: Pt. will be independent with vision HEP ADL Goal: Additional Goal #1 - Progress: Not met  OT Evaluation Precautions/Restrictions  Precautions Precautions: Fall Restrictions Weight Bearing Restrictions: No Prior Functioning Home Living Lives With: Spouse Type of Home: House Home Layout: One level Home  Access: Ramped entrance Bathroom Shower/Tub:  Walk-in shower;Door [employer] Toilet: Standard Bathroom Accessibility: Yes Home Adaptive Equipment: Shower chair with back;Bedside commode/3-in-1;Walker - rolling;Straight cane;Reacher;Long-handled sponge;Sock aid Additional Comments: Pt relies on use of AE for LB ADLs due to knee surgeries and back surgery Prior Function Level of Independence: Independent with basic ADLs;Independent with transfers;Independent with gait;Independent with homemaking with ambulation Able to Take Stairs?: Yes Driving: Yes Vocation: Full time employment Vocation Requirements: 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 able to only comb hair, but with increased dizziness.  Unable to complete further grooming activity due to dizziness Where Assessed - Grooming: Standing at sink Upper Body Bathing: Simulated;Minimal assistance Where Assessed - Upper Body Bathing: Unsupported;Sitting, bed Lower Body Bathing: Simulated;Moderate assistance Where Assessed - Lower Body Bathing: Sit to stand from bed Upper Body Dressing: Simulated;Minimal assistance Where Assessed - Upper Body Dressing: Sitting, bed Lower Body Dressing: Simulated;Maximal assistance Where Assessed - Lower Body Dressing: Sit to stand from bed Toilet Transfer: Simulated;Moderate assistance Toilet Transfer Method: Squat pivot (Unable to ambulate to BR due to decreased balance) Toilet Transfer Equipment: Bedside commode Toileting - Clothing Manipulation: Simulated;Moderate assistance Where Assessed - Toileting Clothing Manipulation: Standing Toileting - Hygiene: Simulated;Minimal assistance Where Assessed - Toileting  Hygiene: Sit on 3-in-1 or toilet ADL Comments: Pt. reports dizziness 2/10 with bed mobility and sit to stand; 3/10 with ambulation to sink.  and 4/10 standing at performing groomng - induced nausea.  Pt. initially requiring min A to ambulate to sink, but decreased to mod A due to imbalance, and pt. pitching - unable to right posture, and pt noted to moved quickly/impulsively Vision/Perception  Vision - History Baseline Vision: Wears glasses only for reading Patient Visual Report: Diplopia;Blurring of vision;Eye fatigue/eye pain/headache;Unable to keep objects in focus;Nausea/blurring vision with head movement Vision - Assessment Eye Alignment: Within Functional Limits Vision Assessment: Vision tested Ocular Range of Motion: Within Functional Limits Tracking/Visual Pursuits: Decreased smoothness of horizontal tracking;Decreased smoothness of vertical tracking;Decreased smoothness of eye movement to RIGHT superior field;Decreased smoothness of eye movement to LEFT superior field;Decreased smoothness of eye movement to RIGHT inferior field;Decreased smoothness of eye movement to LEFT inferior field (pt. with nystagmus noted with all eye movements) Convergence: Within functional limits Diplopia Assessment: Disappears with one eye closed;Objects split on top of one another;Only with right gaze (until fatigued, then becomes more prevalent) Additional Comments: Pt. with horizontal nystagmus greater to Rt., but also present to Lt.  Perception Perception: Within Functional Limits Praxis Praxis: Intact Cognition Cognition Arousal/Alertness: Awake/alert Overall Cognitive Status: Appears within functional limits for tasks assessed Orientation Level: Oriented X4 Sensation/Coordination Sensation Light Touch: Appears Intact Coordination Gross Motor Movements are Fluid and Coordinated: Yes Fine Motor Movements are Fluid and Coordinated: Yes (Pt. with questionable mild dysmetria Rt UE) Extremity  Assessment RUE Assessment RUE Assessment: Within Functional Limits LUE Assessment LUE Assessment: Within Functional Limits Mobility  Bed Mobility Bed Mobility: Yes Rolling Left: 6: Modified independent (Device/Increase time);With rail Left Sidelying to Sit: 6: Modified independent (Device/Increase time);With rails Sitting - Scoot to Edge of Bed: 6: Modified independent (Device/Increase time) Sit to Supine - Left: 6: Modified independent (Device/Increase time);HOB flat Transfers Transfers: Yes Sit to Stand: With upper extremity assist;From bed;4: Min assist Stand to Sit: 4: Min assist;To bed;With upper extremity assist Exercises  End of Session OT - End of Session Activity Tolerance: Other (comment) (limited by dizziness) Patient left: in bed;with call bell in reach;with family/visitor present General Behavior During Session: [facility] for tasks performed Cognition: [facility] for tasks performed   Locke Barrell, Ursula Alert M 07/20/2011, 1:32 PM

## 2011-07-20 NOTE — Progress Notes (Signed)
Stroke Team Progress Note  SUBJECTIVE  [name] is a 70 y.o. female history of non-Hodgkin B-cell lymphoma in remission, hypertension, and hyperlipidemia who presents with 36 hours of vertigo, nausea and emesis. Patient reports she awoke Saturday morning at about 6:30am with vertigo at rest and nausea. This worsened over the course of the day and was accompanied by multiple episodes of emesis. She presented to the [facility] ED and was found to have vertigo, nystagmus, and nausea that did not appear to be positional or fatiguable. As a result, a CT head and, ultimately, and MRI brain were performed. MRI demonstrated a mildly diffusion positive region of the left pons corresponding to changes in the [facility] and other modalities and consistent with an acute or subacute pontine infarct. Patient has no previous history of stroke. She denies other symptoms of weakness, numbness, changes in speech, swallowing or gait.    Her daughter is at the bedside. Overall she feels her condition is gradually improving, though still has some nausea and dizziness. She has a history of presumed statin intolerance, though she stopped it primarily due to what she heard others say, not what she personally experienced. She has been evaluated by therapy and IP rehab recommended. Pt would like to be home by Christmas.  OBJECTIVE Most recent Vital Signs: Temp: 97.5 F (36.4 C) (12/18 0540) Temp src: Oral (12/18 0540) BP: 154/84 mmHg (12/18 0540) Pulse Rate: 59  (12/18 0540) Respiratory Rate: 18 O2 Saturdation: 94%  CBG (last 3)  No results found for this basename: GLUCAP:3 in the last 72 hours Intake/Output from previous day: 12/17 0701 - 12/18 0700 In: 375 [I.V.:375] Out: -   IV Fluid Intake:      . 0.9 % NaCl with KCl 20 mEq / L 75 mL/hr at 07/20/11 0236   Diet:  General thin liquids Activity:  Bedrest DVT Prophylaxis:  Heparin 5000 units sq tid   Studies: CBC    Component Value Date/Time   WBC 9.2 07/18/2011 0545     WBC 5.3 11/18/2008 1132   RBC 4.10 07/18/2011 0545   RBC 3.69* 11/18/2008 1132   HGB 12.1 07/18/2011 0545   HGB 11.2* 11/18/2008 1132   HCT 37.9 07/18/2011 0545   HCT 33.6* 11/18/2008 1132   PLT 273 07/18/2011 0545   PLT 235 11/18/2008 1132   MCV 92.4 07/18/2011 0545   MCV 91.2 11/18/2008 1132   MCH 29.5 07/18/2011 0545   MCH 30.3 11/18/2008 1132   MCHC 31.9 07/18/2011 0545   MCHC 33.3 11/18/2008 1132   RDW 13.2 07/18/2011 0545   RDW 14.7* 11/18/2008 1132   LYMPHSABS 2.1 07/17/2011 2045   LYMPHSABS 0.5* 11/18/2008 1132   MONOABS 0.3 07/17/2011 2045   MONOABS 0.0* 11/18/2008 1132   EOSABS 0.0 07/17/2011 2045   EOSABS 0.5 11/18/2008 1132   BASOSABS 0.0 07/17/2011 2045   BASOSABS 0.0 11/18/2008 1132   CMP    Component Value Date/Time   NA 136 07/20/2011 0541   K 3.4* 07/20/2011 0541   CL 99 07/20/2011 0541   CO2 31 07/20/2011 0541   GLUCOSE 96 07/20/2011 0541   BUN 10 07/20/2011 0541   CREATININE 0.64 07/20/2011 0541   CALCIUM 8.6 07/20/2011 0541   PROT 6.6 07/17/2011 2045   ALBUMIN 3.0* 07/17/2011 2045   AST 15 07/17/2011 2045   ALT 18 07/17/2011 2045   ALKPHOS 92 07/17/2011 2045   BILITOT 0.4 07/17/2011 2045   GFRNONAA 88* 07/20/2011 0541   GFRAA >90 07/20/2011  0541   COAGS Lab Results  Component Value Date   INR 0.97 07/17/2011   INR 1.0 05/10/2008   INR 1.0 04/26/2008   Lipids Lab Results  Component Value Date   CHOL 191 07/19/2011   HDL 35* 07/19/2011   LDLCALC 111* 07/19/2011   TRIG 223* 07/19/2011   CHOLHDL 5.5 07/19/2011   HgbA1C  Lab Results  Component Value Date   HGBA1C 6.2* 07/19/2011   Urine Drug Screen  No results found for this basename: labopia,  cocainscrnur,  labbenz,  amphetmu,  thcu,  labbarb    Alcohol Level No results found for this basename: eth   CT of the brain  No acute abnormality.   CT angio  Not ordered   MRI of the brain   Acute/subacute small left paracentral pontine infarct.   MRA of the brain  Not ordered   2D  Echocardiogram  EF 65-70% with no source of embolus.   Carotid Doppler  No internal carotid artery stenosis bilaterally. Vertebrals with antegrade flow bilaterally.    CXR  Not ordered   EKG  normal sinus rhythm, nonspecific T waves changes.   TCD  Ordered, to be completed  Physical Exam   Pleasant elderly Caucasian lady currently not in distress. Afebrile. Cardiac exam regular heart sounds soft ejection murmur. Lungs clear to auscultation. No pedal edema. Distal pulses are felt.  Neurological exam :Awake alert oriented x 3 normal speech and language.I. Movements are full range with right lateral gaze evoked nystagmus.No  subjective diplopia on right lateral gaze only. Mild left lower face asymmetry. Tongue midline. No drift. Mild diminished fine finger movements on left. Orbits right over left upper extremity. Mild left grip weak.Mild left leg weakness due to knee pain mainly.. Normal sensation .mild left finger-to-nose impaired coordination. Gait not done  ASSESSMENT [name] is a 70 y.o. female with a small left pontine infarct, secondary to small vessel disease. On aspirin for secondary stroke prevention.  Hyperlipidemia, on zocor  Stroke risk factors:  family history, hypertension and obesity, hyperlipidemia  Hospital day # 3  TREATMENT/PLAN Will f/u TCD. Rehab consult pending. Agree with need for IP rehab. No further workup. Will sign off. Follow up with Dr. Pearlean Brownie in 2 months.   Joaquin Music, ANP-BC, GNP-BC [facility] Pager: [phone] 07/20/2011 8:16 AM  Dr. Delia Heady, Stroke Center Medical Director, has personally reviewed chart, pertinent data, examined the patient and developed the plan of care.

## 2011-07-21 NOTE — Progress Notes (Signed)
Rehab admissions - Continuing to follow.  Spoke with patient and her daughter.  Patient prefers to go home rather than come to inpatient rehab.  She has her husband and has 5 daughters who can assist.  Dtr in room today assisted patient up to [facility] and did well.  Patient reports less nausea today.  Call me for questions.  Pager [phone]

## 2011-07-21 NOTE — Progress Notes (Signed)
Subjective:  She is having less dizziness, and is not walking much yet. She declined a CIR stay for initial rehabilitation. She is not having headaches or nausea.  Objective: Vital signs in last 24 hours: Temp:  [97.7 F (36.5 C)-98.3 F (36.8 C)] 97.7 F (36.5 C) (12/19 1404) Pulse Rate:  [63-69] 63  (12/19 1404) Resp:  [19-20] 19  (12/19 1404) BP: (139-162)/(74-80) 157/74 mmHg (12/19 1404) SpO2:  [63 %-98 %] 94 % (12/19 1404) Weight change:    Intake/Output from previous day: 12/18 0701 - 12/19 0700 In: 1306 [P.O.:601; I.V.:705] Out: -    General appearance: alert, cooperative and no distress Resp: clear to auscultation bilaterally Cardio: regular rate and rhythm, S1, S2 normal, no murmur, click, rub or gallop Extremities: extremities normal, atraumatic, no cyanosis or edema  Lab Results: No results found for this basename: WBC:2,HGB:2,HCT:2,PLT:2 in the last 72 hours BMET  [facility] 07/20/11 0541  NA 136  K 3.4*  CL 99  CO2 31  GLUCOSE 96  BUN 10  CREATININE 0.64  CALCIUM 8.6   CMET CMP     Component Value Date/Time   NA 136 07/20/2011 0541   K 3.4* 07/20/2011 0541   CL 99 07/20/2011 0541   CO2 31 07/20/2011 0541   GLUCOSE 96 07/20/2011 0541   BUN 10 07/20/2011 0541   CREATININE 0.64 07/20/2011 0541   CALCIUM 8.6 07/20/2011 0541   PROT 6.6 07/17/2011 2045   ALBUMIN 3.0* 07/17/2011 2045   AST 15 07/17/2011 2045   ALT 18 07/17/2011 2045   ALKPHOS 92 07/17/2011 2045   BILITOT 0.4 07/17/2011 2045   GFRNONAA 88* 07/20/2011 0541   GFRAA >90 07/20/2011 0541    CBG (last 3)  No results found for this basename: GLUCAP:3 in the last 72 hours  INR RESULTS:   Lab Results  Component Value Date   INR 0.97 07/17/2011   INR 1.0 05/10/2008   INR 1.0 04/26/2008     Studies/Results: No results found.  Medications: I have reviewed the patient's current medications.  Assessment/Plan: #1 Stroke:  Slowly improving neurologic deficits. We will continue PT and  OT rehab and consider discharge to home in the next 24-48 hours. #2 HTN:  Stable on current meds.   LOS: 4 days   Tyler Robidoux G 07/21/2011, 7:20 PM

## 2011-07-21 NOTE — Plan of Care (Signed)
Problem: Phase II Progression Outcomes Goal: Tolerates increased mobility Outcome: Progressing Up in hallway today with walker-with NT and tol. well.

## 2011-07-22 MED ORDER — ASPIRIN 81 MG PO TBEC: 81.0000 mg | DELAYED_RELEASE_TABLET | Freq: Every day | ORAL | Status: AC

## 2011-07-22 MED ORDER — FUROSEMIDE 40 MG PO TABS: ORAL_TABLET | ORAL | Status: DC

## 2011-07-22 NOTE — Progress Notes (Signed)
Physical Therapy Treatment Patient Details Name: [name] MRN: [id number] DOB: 02-24-1941 Today's Date: 07/22/2011  PT Assessment/Plan  PT - Assessment/Plan Comments on Treatment Session: Pt. continuing to make fast improvements with her mobility, ambulating with increased gait velocity in the hallway with visual stimuli and head movements as challenges.  Pt. with no increase in dizziness from her baseline level throughout session and was able to tolerate being wheeled around in a chair to simulate riding in a car.  Pt. feels that she is at a high enough level to return home, and pending how the car ride home goes, recommend outpatient PT.  Encouraged pt. to continue to perform x1 and x2 exercises.   PT Plan: Discharge plan needs to be updated;Frequency remains appropriate Follow Up Recommendations: Outpatient PT (HHPT if cannot tolerate riding in car) Equipment Recommended: None recommended by PT PT Goals  Acute Rehab PT Goals PT Goal: Stand - Progress: Progressing toward goal PT Goal: Ambulate - Progress: Met Additional Goals PT Goal: Additional Goal #1 - Progress: Progressing toward goal PT Goal: Additional Goal #2 - Progress: Progressing toward goal  PT Treatment Precautions/Restrictions  Precautions Precautions: Fall Restrictions Weight Bearing Restrictions: No Mobility (including Balance) Bed Mobility Bed Mobility: Yes Supine to Sit: HOB elevated (Comment degrees);6: Modified independent (Device/Increase time) (8 degrees) Transfers Transfers: Yes Sit to Stand: From bed;From chair/3-in-1;6: Modified independent (Device/Increase time) Stand to Sit: 6: Modified independent (Device/Increase time);To chair/3-in-1 Stand to Sit Details: Two trials.   Ambulation/Gait Ambulation/Gait: Yes Ambulation/Gait Assistance: 5: Supervision Ambulation/Gait Assistance Details (indicate cue type and reason): Pt. ambulated with RW and instructed to turn head to challenge vestibular system.    Ambulation Distance (Feet): 200 Feet Assistive device: Rolling walker (Walked about 10 feet with no device) Gait Pattern: Step-through pattern;Decreased stance time - left;Decreased step length - right Stairs: No    Exercise  Other Exercises Other Exercises: Wheeled patient in chair to simulate riding in car x 400 feet (at least) to challenge vestibular system. Pt. with no increase in dizziness from her baseline level (2/10) with no reports of nausea.  End of Session PT - End of Session Equipment Utilized During Treatment: Gait belt Activity Tolerance: Patient tolerated treatment well Patient left: in chair;with call bell in reach;with family/visitor present Nurse Communication: Mobility status for transfers;Mobility status for ambulation General Behavior During Session: [facility] for tasks performed Cognition: [facility] for tasks performed  Laney Pastor, SPT  07/22/2011, 3:07 PM

## 2011-07-22 NOTE — Discharge Summary (Signed)
Physician Discharge Summary  Patient ID: CHARINA FONS MRN: [id number] DOB/AGE: 1940-11-07 70 y.o.  Admit date: 07/17/2011 Discharge date: 07/22/2011   Discharge Diagnoses:  Principal Problem:  *Stroke Active Problems:  Nausea and vomiting  Lateral nystagmus  Hypertension  Obesity  History of hypokalemia  Back pain, chronic   Discharged Condition: good  Hospital Course: The patient is a 70 year old Caucasian woman who on the day prior to hospitalization developed severe nausea and vomiting. She tried taking Phenergan suppositories which did not help so she presented to the emergency room for evaluation. In the emergency room her physical exam was most remarkable for lateral nystagmus. A CT scan of the head done without contrast showed no acute abnormalities. She was admitted for further evaluation. She had multiple studies during her hospitalization including an EKG that showed a normal sinus rhythm, a transthoracic echocardiogram that showed left ventricular ejection fraction of 65-70% with moderate left atrial enlargement, mild mitral regurgitation, and normal right ventricular systolic function. She also had a carotid ultrasound exam that showed no extracranial carotid artery disease and normal vertebral arteries, a transcranial Doppler ultrasound exam was unremarkable. A brain MRI scan done without contrast showed an acute/subacute small left paracentral pontine infarct. She was seen by multiple consultants including a neurologist, a physician with the rehabilitation medicine service, and physical and occupational therapist. She passed a bedside swallow exam without difficulty. She showed rapid improvement in her severe nystagmus and her ability to ambulate. It was felt that she could have outpatient rehabilitation due to her rapid improvement. On the day of discharge she was not having headaches, nausea, vomiting, changes in her vision, and only mild lightheadedness with  ambulation.  Consults: neurology and rehabilitation medicine  Significant Diagnostic Studies:  No results found.  Labs: Lab Results  Component Value Date   WBC 9.2 07/18/2011   HGB 12.1 07/18/2011   HCT 37.9 07/18/2011   MCV 92.4 07/18/2011   PLT 273 07/18/2011     Lab 07/20/11 0541 07/17/11 2045  NA 136 --  K 3.4* --  CL 99 --  CO2 31 --  BUN 10 --  CREATININE 0.64 --  CALCIUM 8.6 --  PROT -- 6.6  BILITOT -- 0.4  ALKPHOS -- 92  ALT -- 18  AST -- 15  GLUCOSE 96 --       Lab Results  Component Value Date   INR 0.97 07/17/2011   INR 1.0 05/10/2008   INR 1.0 04/26/2008     Recent Results (from the past 240 hour(s))  URINE CULTURE     Status: Normal   Collection Time   07/17/11 10:50 PM      Component Value Range Status Comment   Specimen Description URINE, RANDOM   Final    Special Requests NONE   Final    Setup Time [id number]   Final    Colony Count >=100,000 COLONIES/ML   Final    Culture ESCHERICHIA COLI   Final    Report Status 07/19/2011 FINAL   Final    Organism ID, Bacteria ESCHERICHIA COLI   Final       Discharge Exam: Blood pressure 152/76, pulse 73, temperature 98.4 F (36.9 C), temperature source Oral, resp. rate 20, height 5\' 1"  (1.549 m), weight 115.667 kg (255 lb), SpO2 94.00%.  Physical Exam: In general, she is a overweight white woman who was in no apparent distress while sitting up in a chair. HEENT exam was within normal limits, neck was supple without jugular  venous distention or carotid bruit, chest was clear to auscultation, heart had a regular rate and rhythm with a systolic ejection murmur grade 1/6 at the left sternal border, abdomen had normal bowel sounds no tenderness, she had bilateral 2+ leg edema. Neurologic exam: She was alert and well-oriented with normal affect, cranial nerves II through XII were significant solely for decreased hearing bilaterally, sensory exam was grossly normal, motor strength was 5 out of 5 throughout, her  bilateral finger to nose testing was normal, she was able to walk with a walker about 30 feet with a turnaround and return to her seat independently. She also showed that she could walk with minimal one-person assistance from her daughter.  Disposition: She'll be discharged to home today. Our office will contact her regarding outpatient rehabilitation that should start within the next week. She should have a follow up appointment scheduled Dr. Jarome Matin in approximately 2 weeks following discharge. She should have a followup appointment with Dr. Pearlean Brownie in approximately 2 months following discharge.   Current Discharge Medication List    START taking these medications   Details  aspirin 81 MG EC tablet Take 1 tablet (81 mg total) by mouth daily. Swallow whole. Qty: 30 tablet, Refills: 12      CONTINUE these medications which have CHANGED   Details  furosemide (LASIX) 40 MG tablet One tablet 2 x a week or as directed Qty: 30 tablet, Refills: 6   Associated Diagnoses: HTN (hypertension)      CONTINUE these medications which have NOT CHANGED   Details  DULoxetine (CYMBALTA) 60 MG capsule Take 60 mg by mouth daily.      fluticasone (FLOVENT HFA) 110 MCG/ACT inhaler Inhale 1 puff into the lungs 2 (two) times daily.      loratadine (CLARITIN) 10 MG tablet Take 10 mg by mouth daily.      acetaminophen (TYLENOL EX ST ARTHRITIS PAIN) 500 MG tablet Take 1,000 mg by mouth every 6 (six) hours as needed.     albuterol (PROVENTIL HFA;VENTOLIN HFA) 108 (90 BASE) MCG/ACT inhaler Inhale 2 puffs into the lungs every 6 (six) hours as needed. wheezing     clotrimazole (LOTRIMIN) 1 % cream Apply 1 application topically as needed. For under the breasts     lubiprostone (AMITIZA) 24 MCG capsule Take 24 mcg by mouth daily.     metoprolol (LOPRESSOR) 50 MG tablet     Olmesartan-Amlodipine-HCTZ (TRIBENZOR) 40-5-12.5 MG TABS Take 1 tablet by mouth daily.      omeprazole (PRILOSEC OTC) 20 MG tablet  Take 20 mg by mouth daily. Only as needed    pravastatin (PRAVACHOL) 20 MG tablet Take 20 mg by mouth daily.      Vitamin D, Ergocalciferol, (DRISDOL) 50000 UNITS CAPS Take 50,000 Units by mouth every 7 (seven) days. Every saturday       STOP taking these medications     promethazine (PHENERGAN) 25 MG tablet        Follow-up Information    Follow up with Gates Rigg, MD. Make an appointment in 2 months.   Contact information:   [address] [facility] [place] [address] [phone]       Follow up with Garlan Fillers, MD in 2 weeks.   Contact information:   [address], [place]. [place] [address] [phone]          Signed: Garlan Fillers 07/22/2011, 7:19 PM

## 2011-07-22 NOTE — Progress Notes (Signed)
Seen and agree with SPT. Nystagmus still noted but not as obvious as prior visits and pt becoming accustomed to eye movement. Toney Sang, PT [phone]

## 2011-07-22 NOTE — Progress Notes (Signed)
Occupational Therapy treatment Patient Details Name: [name] MRN: [id number] DOB: 17-Mar-1941 Today's Date: 07/22/2011  OT Assessment/Plan OT Assessment/Plan Comments on Treatment Session: Pt progressing well.  Continues with diplopia Rt. superior gaze, and dizziness with head turns when ambulating.  Pt. eager to return home with family. She has all DME.  Recommend OPOT OT Plan: Discharge plan needs to be updated OT Frequency: Min 2X/week Follow Up Recommendations: Outpatient OT Equipment Recommended: None recommended by OT OT Goals ADL Goals ADL Goal: Grooming - Progress: Met ADL Goal: Upper Body Bathing - Progress: Met ADL Goal: Toilet Transfer - Progress: Met ADL Goal: Toileting - Clothing Manipulation - Progress: Met ADL Goal: Additional Goal #1 - Progress: Met  OT Treatment Precautions/Restrictions  Precautions Precautions: Fall   ADL ADL Grooming: Performed;Wash/dry hands;Brushing hair;Supervision/safety Where Assessed - Grooming: Standing at sink Upper Body Bathing: Simulated;Set up Where Assessed - Upper Body Bathing: Unsupported;Sitting, bed Toilet Transfer: Performed;Supervision/safety Toilet Transfer Method: Proofreader: Comfort height toilet Toileting - Clothing Manipulation: Performed;Supervision/safety Where Assessed - Toileting Clothing Manipulation: Standing Toileting - Hygiene: Performed;Independent Where Assessed - Toileting Hygiene: Sit on 3-in-1 or toilet Tub/Shower Transfer: Not assessed Equipment Used: Rolling walker ADL Comments: Pt. reports Diplopia with Rt. superior gaze only.  Pt. is independent with vision exercises.  Pt. eager to return home and feels she will do okay in home environment with family's assist as needed.  Pt. ambulated in hallway turning head to read room numbers - dizziness increased 3-4/10 and pt. required a rest break.  Spoke with pt, and dtr re: recommendation for OPOT if she can tolerate car  ride Mobility  End of Session OT - End of Session Activity Tolerance: Patient tolerated treatment well Patient left: in bed;with call bell in reach;with family/visitor present General Behavior During Session: [facility] for tasks performed Cognition: [facility] for tasks performed  Saphira Lahmann M  07/22/2011, 3:56 PM

## 2011-08-02 ENCOUNTER — Ambulatory Visit: Payer: Medicare Other | Attending: Neurology | Admitting: Physical Therapy

## 2011-08-02 DIAGNOSIS — I69998 Other sequelae following unspecified cerebrovascular disease: Secondary | ICD-10-CM | POA: Insufficient documentation

## 2011-08-02 DIAGNOSIS — R5381 Other malaise: Secondary | ICD-10-CM | POA: Insufficient documentation

## 2011-08-02 DIAGNOSIS — R293 Abnormal posture: Secondary | ICD-10-CM | POA: Insufficient documentation

## 2011-08-02 DIAGNOSIS — IMO0001 Reserved for inherently not codable concepts without codable children: Secondary | ICD-10-CM | POA: Insufficient documentation

## 2011-08-04 ENCOUNTER — Ambulatory Visit: Payer: Medicare Other | Attending: Neurology | Admitting: Physical Therapy

## 2011-08-04 DIAGNOSIS — R293 Abnormal posture: Secondary | ICD-10-CM | POA: Diagnosis not present

## 2011-08-04 DIAGNOSIS — R5381 Other malaise: Secondary | ICD-10-CM | POA: Insufficient documentation

## 2011-08-04 DIAGNOSIS — I69998 Other sequelae following unspecified cerebrovascular disease: Secondary | ICD-10-CM | POA: Diagnosis not present

## 2011-08-04 DIAGNOSIS — IMO0001 Reserved for inherently not codable concepts without codable children: Secondary | ICD-10-CM | POA: Diagnosis not present

## 2011-08-11 ENCOUNTER — Other Ambulatory Visit: Payer: Self-pay | Admitting: *Deleted

## 2011-08-13 ENCOUNTER — Encounter: Payer: No Typology Code available for payment source | Admitting: Physical Therapy

## 2011-08-13 DIAGNOSIS — E785 Hyperlipidemia, unspecified: Secondary | ICD-10-CM | POA: Diagnosis not present

## 2011-08-13 DIAGNOSIS — M810 Age-related osteoporosis without current pathological fracture: Secondary | ICD-10-CM | POA: Diagnosis not present

## 2011-08-13 DIAGNOSIS — I10 Essential (primary) hypertension: Secondary | ICD-10-CM | POA: Diagnosis not present

## 2011-08-20 ENCOUNTER — Ambulatory Visit: Payer: Medicare Other | Admitting: Physical Therapy

## 2011-08-20 DIAGNOSIS — Z0000 Encounter for general adult medical examination without abnormal findings: Secondary | ICD-10-CM | POA: Diagnosis not present

## 2011-08-20 DIAGNOSIS — I10 Essential (primary) hypertension: Secondary | ICD-10-CM | POA: Diagnosis not present

## 2011-08-20 DIAGNOSIS — E785 Hyperlipidemia, unspecified: Secondary | ICD-10-CM | POA: Diagnosis not present

## 2011-08-20 DIAGNOSIS — J45909 Unspecified asthma, uncomplicated: Secondary | ICD-10-CM | POA: Diagnosis not present

## 2011-08-27 ENCOUNTER — Encounter: Payer: No Typology Code available for payment source | Admitting: Physical Therapy

## 2011-10-05 DIAGNOSIS — I6350 Cerebral infarction due to unspecified occlusion or stenosis of unspecified cerebral artery: Secondary | ICD-10-CM | POA: Diagnosis not present

## 2011-10-13 DIAGNOSIS — G4731 Primary central sleep apnea: Secondary | ICD-10-CM | POA: Diagnosis not present

## 2011-10-13 DIAGNOSIS — G4733 Obstructive sleep apnea (adult) (pediatric): Secondary | ICD-10-CM | POA: Diagnosis not present

## 2011-11-02 DIAGNOSIS — G4733 Obstructive sleep apnea (adult) (pediatric): Secondary | ICD-10-CM | POA: Diagnosis not present

## 2011-11-02 DIAGNOSIS — E662 Morbid (severe) obesity with alveolar hypoventilation: Secondary | ICD-10-CM | POA: Diagnosis not present

## 2011-11-05 DIAGNOSIS — M545 Low back pain, unspecified: Secondary | ICD-10-CM | POA: Diagnosis not present

## 2011-11-05 DIAGNOSIS — M25569 Pain in unspecified knee: Secondary | ICD-10-CM | POA: Diagnosis not present

## 2011-11-05 DIAGNOSIS — I10 Essential (primary) hypertension: Secondary | ICD-10-CM | POA: Diagnosis not present

## 2011-11-05 DIAGNOSIS — I6350 Cerebral infarction due to unspecified occlusion or stenosis of unspecified cerebral artery: Secondary | ICD-10-CM | POA: Diagnosis not present

## 2011-11-05 DIAGNOSIS — I639 Cerebral infarction, unspecified: Secondary | ICD-10-CM | POA: Insufficient documentation

## 2011-11-18 DIAGNOSIS — Z79899 Other long term (current) drug therapy: Secondary | ICD-10-CM | POA: Diagnosis not present

## 2011-11-18 DIAGNOSIS — Z7982 Long term (current) use of aspirin: Secondary | ICD-10-CM | POA: Diagnosis not present

## 2011-11-18 DIAGNOSIS — M899 Disorder of bone, unspecified: Secondary | ICD-10-CM | POA: Diagnosis not present

## 2011-11-18 DIAGNOSIS — C8589 Other specified types of non-Hodgkin lymphoma, extranodal and solid organ sites: Secondary | ICD-10-CM | POA: Diagnosis not present

## 2011-11-18 DIAGNOSIS — N63 Unspecified lump in unspecified breast: Secondary | ICD-10-CM | POA: Diagnosis not present

## 2011-11-23 DIAGNOSIS — Z09 Encounter for follow-up examination after completed treatment for conditions other than malignant neoplasm: Secondary | ICD-10-CM | POA: Diagnosis not present

## 2011-11-23 DIAGNOSIS — Z853 Personal history of malignant neoplasm of breast: Secondary | ICD-10-CM | POA: Diagnosis not present

## 2012-01-06 DIAGNOSIS — H35379 Puckering of macula, unspecified eye: Secondary | ICD-10-CM | POA: Diagnosis not present

## 2012-01-06 DIAGNOSIS — H43399 Other vitreous opacities, unspecified eye: Secondary | ICD-10-CM | POA: Diagnosis not present

## 2012-01-06 DIAGNOSIS — Z961 Presence of intraocular lens: Secondary | ICD-10-CM | POA: Diagnosis not present

## 2012-01-06 DIAGNOSIS — H40029 Open angle with borderline findings, high risk, unspecified eye: Secondary | ICD-10-CM | POA: Diagnosis not present

## 2012-01-06 DIAGNOSIS — H43819 Vitreous degeneration, unspecified eye: Secondary | ICD-10-CM | POA: Diagnosis not present

## 2012-03-15 DIAGNOSIS — J45909 Unspecified asthma, uncomplicated: Secondary | ICD-10-CM | POA: Diagnosis not present

## 2012-03-15 DIAGNOSIS — R059 Cough, unspecified: Secondary | ICD-10-CM | POA: Diagnosis not present

## 2012-03-15 DIAGNOSIS — J309 Allergic rhinitis, unspecified: Secondary | ICD-10-CM | POA: Diagnosis not present

## 2012-03-15 DIAGNOSIS — R05 Cough: Secondary | ICD-10-CM | POA: Diagnosis not present

## 2012-03-15 DIAGNOSIS — I10 Essential (primary) hypertension: Secondary | ICD-10-CM | POA: Diagnosis not present

## 2012-04-28 DIAGNOSIS — I6350 Cerebral infarction due to unspecified occlusion or stenosis of unspecified cerebral artery: Secondary | ICD-10-CM | POA: Diagnosis not present

## 2012-04-28 DIAGNOSIS — I10 Essential (primary) hypertension: Secondary | ICD-10-CM | POA: Diagnosis not present

## 2012-04-28 DIAGNOSIS — E678 Other specified hyperalimentation: Secondary | ICD-10-CM | POA: Diagnosis not present

## 2012-04-28 DIAGNOSIS — G4733 Obstructive sleep apnea (adult) (pediatric): Secondary | ICD-10-CM | POA: Diagnosis not present

## 2012-05-25 DIAGNOSIS — M25519 Pain in unspecified shoulder: Secondary | ICD-10-CM | POA: Diagnosis not present

## 2012-05-25 DIAGNOSIS — Z23 Encounter for immunization: Secondary | ICD-10-CM | POA: Diagnosis not present

## 2012-05-25 DIAGNOSIS — C8589 Other specified types of non-Hodgkin lymphoma, extranodal and solid organ sites: Secondary | ICD-10-CM | POA: Diagnosis not present

## 2012-07-06 DIAGNOSIS — I6350 Cerebral infarction due to unspecified occlusion or stenosis of unspecified cerebral artery: Secondary | ICD-10-CM | POA: Diagnosis not present

## 2012-08-11 DIAGNOSIS — I6350 Cerebral infarction due to unspecified occlusion or stenosis of unspecified cerebral artery: Secondary | ICD-10-CM | POA: Diagnosis not present

## 2012-08-16 DIAGNOSIS — E785 Hyperlipidemia, unspecified: Secondary | ICD-10-CM | POA: Diagnosis not present

## 2012-08-16 DIAGNOSIS — R82998 Other abnormal findings in urine: Secondary | ICD-10-CM | POA: Diagnosis not present

## 2012-08-16 DIAGNOSIS — I10 Essential (primary) hypertension: Secondary | ICD-10-CM | POA: Diagnosis not present

## 2012-08-24 DIAGNOSIS — I10 Essential (primary) hypertension: Secondary | ICD-10-CM | POA: Diagnosis not present

## 2012-08-24 DIAGNOSIS — E785 Hyperlipidemia, unspecified: Secondary | ICD-10-CM | POA: Diagnosis not present

## 2012-08-24 DIAGNOSIS — J45909 Unspecified asthma, uncomplicated: Secondary | ICD-10-CM | POA: Diagnosis not present

## 2012-08-24 DIAGNOSIS — Z0000 Encounter for general adult medical examination without abnormal findings: Secondary | ICD-10-CM | POA: Diagnosis not present

## 2012-09-20 DIAGNOSIS — H40029 Open angle with borderline findings, high risk, unspecified eye: Secondary | ICD-10-CM | POA: Diagnosis not present

## 2012-09-20 DIAGNOSIS — D492 Neoplasm of unspecified behavior of bone, soft tissue, and skin: Secondary | ICD-10-CM | POA: Diagnosis not present

## 2012-11-23 DIAGNOSIS — C8589 Other specified types of non-Hodgkin lymphoma, extranodal and solid organ sites: Secondary | ICD-10-CM | POA: Diagnosis not present

## 2012-12-13 ENCOUNTER — Encounter: Payer: Self-pay | Admitting: Cardiology

## 2013-01-01 ENCOUNTER — Encounter: Payer: Self-pay | Admitting: Neurology

## 2013-01-01 ENCOUNTER — Ambulatory Visit: Payer: Self-pay | Admitting: Neurology

## 2013-01-01 ENCOUNTER — Other Ambulatory Visit: Payer: Self-pay | Admitting: *Deleted

## 2013-02-01 DIAGNOSIS — F419 Anxiety disorder, unspecified: Secondary | ICD-10-CM | POA: Insufficient documentation

## 2013-02-01 DIAGNOSIS — F341 Dysthymic disorder: Secondary | ICD-10-CM | POA: Diagnosis not present

## 2013-02-01 DIAGNOSIS — Z6841 Body Mass Index (BMI) 40.0 and over, adult: Secondary | ICD-10-CM | POA: Diagnosis not present

## 2013-02-01 DIAGNOSIS — I10 Essential (primary) hypertension: Secondary | ICD-10-CM | POA: Diagnosis not present

## 2013-02-01 DIAGNOSIS — M25569 Pain in unspecified knee: Secondary | ICD-10-CM | POA: Diagnosis not present

## 2013-02-01 DIAGNOSIS — Z1331 Encounter for screening for depression: Secondary | ICD-10-CM | POA: Diagnosis not present

## 2013-02-01 DIAGNOSIS — I872 Venous insufficiency (chronic) (peripheral): Secondary | ICD-10-CM | POA: Diagnosis not present

## 2013-04-24 DIAGNOSIS — H40029 Open angle with borderline findings, high risk, unspecified eye: Secondary | ICD-10-CM | POA: Diagnosis not present

## 2013-04-25 DIAGNOSIS — D249 Benign neoplasm of unspecified breast: Secondary | ICD-10-CM | POA: Diagnosis not present

## 2013-04-25 DIAGNOSIS — Z09 Encounter for follow-up examination after completed treatment for conditions other than malignant neoplasm: Secondary | ICD-10-CM | POA: Diagnosis not present

## 2013-06-12 DIAGNOSIS — I2510 Atherosclerotic heart disease of native coronary artery without angina pectoris: Secondary | ICD-10-CM | POA: Diagnosis not present

## 2013-06-12 DIAGNOSIS — K8020 Calculus of gallbladder without cholecystitis without obstruction: Secondary | ICD-10-CM | POA: Diagnosis not present

## 2013-06-12 DIAGNOSIS — C8589 Other specified types of non-Hodgkin lymphoma, extranodal and solid organ sites: Secondary | ICD-10-CM | POA: Diagnosis not present

## 2013-06-12 DIAGNOSIS — I7090 Unspecified atherosclerosis: Secondary | ICD-10-CM | POA: Diagnosis not present

## 2013-06-14 DIAGNOSIS — C8589 Other specified types of non-Hodgkin lymphoma, extranodal and solid organ sites: Secondary | ICD-10-CM | POA: Diagnosis not present

## 2013-06-14 DIAGNOSIS — Z23 Encounter for immunization: Secondary | ICD-10-CM | POA: Diagnosis not present

## 2013-08-22 DIAGNOSIS — I10 Essential (primary) hypertension: Secondary | ICD-10-CM | POA: Diagnosis not present

## 2013-08-22 DIAGNOSIS — E785 Hyperlipidemia, unspecified: Secondary | ICD-10-CM | POA: Diagnosis not present

## 2013-08-22 DIAGNOSIS — M810 Age-related osteoporosis without current pathological fracture: Secondary | ICD-10-CM | POA: Diagnosis not present

## 2013-08-29 DIAGNOSIS — I10 Essential (primary) hypertension: Secondary | ICD-10-CM | POA: Diagnosis not present

## 2013-08-29 DIAGNOSIS — I872 Venous insufficiency (chronic) (peripheral): Secondary | ICD-10-CM | POA: Diagnosis not present

## 2013-08-29 DIAGNOSIS — Z79899 Other long term (current) drug therapy: Secondary | ICD-10-CM | POA: Diagnosis not present

## 2013-08-29 DIAGNOSIS — F341 Dysthymic disorder: Secondary | ICD-10-CM | POA: Diagnosis not present

## 2013-08-29 DIAGNOSIS — R7309 Other abnormal glucose: Secondary | ICD-10-CM | POA: Diagnosis not present

## 2013-08-29 DIAGNOSIS — M810 Age-related osteoporosis without current pathological fracture: Secondary | ICD-10-CM | POA: Diagnosis not present

## 2013-08-29 DIAGNOSIS — E785 Hyperlipidemia, unspecified: Secondary | ICD-10-CM | POA: Diagnosis not present

## 2013-08-29 DIAGNOSIS — I6350 Cerebral infarction due to unspecified occlusion or stenosis of unspecified cerebral artery: Secondary | ICD-10-CM | POA: Diagnosis not present

## 2013-09-07 ENCOUNTER — Other Ambulatory Visit: Payer: Self-pay | Admitting: Internal Medicine

## 2013-09-07 DIAGNOSIS — M542 Cervicalgia: Secondary | ICD-10-CM

## 2013-09-16 ENCOUNTER — Other Ambulatory Visit: Payer: No Typology Code available for payment source

## 2014-01-03 DIAGNOSIS — Z09 Encounter for follow-up examination after completed treatment for conditions other than malignant neoplasm: Secondary | ICD-10-CM | POA: Diagnosis not present

## 2014-01-03 DIAGNOSIS — Z87898 Personal history of other specified conditions: Secondary | ICD-10-CM | POA: Diagnosis not present

## 2014-01-03 DIAGNOSIS — C8589 Other specified types of non-Hodgkin lymphoma, extranodal and solid organ sites: Secondary | ICD-10-CM | POA: Diagnosis not present

## 2014-01-03 DIAGNOSIS — Z9221 Personal history of antineoplastic chemotherapy: Secondary | ICD-10-CM | POA: Diagnosis not present

## 2014-03-15 DIAGNOSIS — M25569 Pain in unspecified knee: Secondary | ICD-10-CM | POA: Diagnosis not present

## 2014-03-15 DIAGNOSIS — F341 Dysthymic disorder: Secondary | ICD-10-CM | POA: Diagnosis not present

## 2014-03-15 DIAGNOSIS — Z6841 Body Mass Index (BMI) 40.0 and over, adult: Secondary | ICD-10-CM | POA: Diagnosis not present

## 2014-03-15 DIAGNOSIS — I10 Essential (primary) hypertension: Secondary | ICD-10-CM | POA: Diagnosis not present

## 2014-03-15 DIAGNOSIS — R7309 Other abnormal glucose: Secondary | ICD-10-CM | POA: Diagnosis not present

## 2014-03-15 DIAGNOSIS — I6350 Cerebral infarction due to unspecified occlusion or stenosis of unspecified cerebral artery: Secondary | ICD-10-CM | POA: Diagnosis not present

## 2014-05-27 DIAGNOSIS — Z23 Encounter for immunization: Secondary | ICD-10-CM | POA: Diagnosis not present

## 2014-06-06 DIAGNOSIS — Z803 Family history of malignant neoplasm of breast: Secondary | ICD-10-CM | POA: Diagnosis not present

## 2014-06-06 DIAGNOSIS — Z1231 Encounter for screening mammogram for malignant neoplasm of breast: Secondary | ICD-10-CM | POA: Diagnosis not present

## 2014-07-11 DIAGNOSIS — Z79899 Other long term (current) drug therapy: Secondary | ICD-10-CM | POA: Diagnosis not present

## 2014-07-11 DIAGNOSIS — C8510 Unspecified B-cell lymphoma, unspecified site: Secondary | ICD-10-CM | POA: Diagnosis not present

## 2014-07-11 DIAGNOSIS — Z7982 Long term (current) use of aspirin: Secondary | ICD-10-CM | POA: Diagnosis not present

## 2014-09-05 DIAGNOSIS — M810 Age-related osteoporosis without current pathological fracture: Secondary | ICD-10-CM | POA: Diagnosis not present

## 2014-09-05 DIAGNOSIS — E785 Hyperlipidemia, unspecified: Secondary | ICD-10-CM | POA: Diagnosis not present

## 2014-09-05 DIAGNOSIS — I10 Essential (primary) hypertension: Secondary | ICD-10-CM | POA: Diagnosis not present

## 2014-09-05 DIAGNOSIS — Z008 Encounter for other general examination: Secondary | ICD-10-CM | POA: Diagnosis not present

## 2014-09-05 DIAGNOSIS — R7301 Impaired fasting glucose: Secondary | ICD-10-CM | POA: Diagnosis not present

## 2014-09-06 DIAGNOSIS — M810 Age-related osteoporosis without current pathological fracture: Secondary | ICD-10-CM | POA: Diagnosis not present

## 2014-09-12 DIAGNOSIS — G4733 Obstructive sleep apnea (adult) (pediatric): Secondary | ICD-10-CM | POA: Diagnosis not present

## 2014-09-12 DIAGNOSIS — E785 Hyperlipidemia, unspecified: Secondary | ICD-10-CM | POA: Diagnosis not present

## 2014-09-12 DIAGNOSIS — M810 Age-related osteoporosis without current pathological fracture: Secondary | ICD-10-CM | POA: Diagnosis not present

## 2014-09-12 DIAGNOSIS — R7301 Impaired fasting glucose: Secondary | ICD-10-CM | POA: Diagnosis not present

## 2014-09-12 DIAGNOSIS — I872 Venous insufficiency (chronic) (peripheral): Secondary | ICD-10-CM | POA: Diagnosis not present

## 2014-09-12 DIAGNOSIS — Z23 Encounter for immunization: Secondary | ICD-10-CM | POA: Diagnosis not present

## 2014-09-12 DIAGNOSIS — Z1389 Encounter for screening for other disorder: Secondary | ICD-10-CM | POA: Diagnosis not present

## 2014-09-12 DIAGNOSIS — I10 Essential (primary) hypertension: Secondary | ICD-10-CM | POA: Diagnosis not present

## 2014-09-12 DIAGNOSIS — Z0000 Encounter for general adult medical examination without abnormal findings: Secondary | ICD-10-CM | POA: Diagnosis not present

## 2014-09-12 DIAGNOSIS — Z6841 Body Mass Index (BMI) 40.0 and over, adult: Secondary | ICD-10-CM | POA: Diagnosis not present

## 2014-09-12 DIAGNOSIS — G463 Brain stem stroke syndrome: Secondary | ICD-10-CM | POA: Diagnosis not present

## 2014-09-12 DIAGNOSIS — J45909 Unspecified asthma, uncomplicated: Secondary | ICD-10-CM | POA: Diagnosis not present

## 2014-09-18 DIAGNOSIS — Z961 Presence of intraocular lens: Secondary | ICD-10-CM | POA: Diagnosis not present

## 2014-09-18 DIAGNOSIS — H35033 Hypertensive retinopathy, bilateral: Secondary | ICD-10-CM | POA: Diagnosis not present

## 2014-09-18 DIAGNOSIS — H43813 Vitreous degeneration, bilateral: Secondary | ICD-10-CM | POA: Diagnosis not present

## 2014-09-18 DIAGNOSIS — H40023 Open angle with borderline findings, high risk, bilateral: Secondary | ICD-10-CM | POA: Diagnosis not present

## 2015-01-03 DIAGNOSIS — M25531 Pain in right wrist: Secondary | ICD-10-CM | POA: Diagnosis not present

## 2015-01-03 DIAGNOSIS — Z6841 Body Mass Index (BMI) 40.0 and over, adult: Secondary | ICD-10-CM | POA: Diagnosis not present

## 2015-01-03 DIAGNOSIS — M25562 Pain in left knee: Secondary | ICD-10-CM | POA: Diagnosis not present

## 2015-01-03 DIAGNOSIS — M19031 Primary osteoarthritis, right wrist: Secondary | ICD-10-CM | POA: Diagnosis not present

## 2015-05-14 DIAGNOSIS — Z23 Encounter for immunization: Secondary | ICD-10-CM | POA: Diagnosis not present

## 2015-07-17 DIAGNOSIS — Z9221 Personal history of antineoplastic chemotherapy: Secondary | ICD-10-CM | POA: Diagnosis not present

## 2015-07-17 DIAGNOSIS — Z79899 Other long term (current) drug therapy: Secondary | ICD-10-CM | POA: Diagnosis not present

## 2015-07-17 DIAGNOSIS — Z08 Encounter for follow-up examination after completed treatment for malignant neoplasm: Secondary | ICD-10-CM | POA: Diagnosis not present

## 2015-07-17 DIAGNOSIS — C8330 Diffuse large B-cell lymphoma, unspecified site: Secondary | ICD-10-CM | POA: Diagnosis not present

## 2015-07-17 DIAGNOSIS — Z8572 Personal history of non-Hodgkin lymphomas: Secondary | ICD-10-CM | POA: Diagnosis not present

## 2015-07-23 DIAGNOSIS — Z1231 Encounter for screening mammogram for malignant neoplasm of breast: Secondary | ICD-10-CM | POA: Diagnosis not present

## 2015-07-23 DIAGNOSIS — Z803 Family history of malignant neoplasm of breast: Secondary | ICD-10-CM | POA: Diagnosis not present

## 2015-09-12 DIAGNOSIS — R8299 Other abnormal findings in urine: Secondary | ICD-10-CM | POA: Diagnosis not present

## 2015-09-12 DIAGNOSIS — N390 Urinary tract infection, site not specified: Secondary | ICD-10-CM | POA: Diagnosis not present

## 2015-09-12 DIAGNOSIS — E784 Other hyperlipidemia: Secondary | ICD-10-CM | POA: Diagnosis not present

## 2015-09-12 DIAGNOSIS — R7301 Impaired fasting glucose: Secondary | ICD-10-CM | POA: Diagnosis not present

## 2015-09-12 DIAGNOSIS — M810 Age-related osteoporosis without current pathological fracture: Secondary | ICD-10-CM | POA: Diagnosis not present

## 2015-09-12 DIAGNOSIS — I10 Essential (primary) hypertension: Secondary | ICD-10-CM | POA: Diagnosis not present

## 2015-09-18 DIAGNOSIS — Z6841 Body Mass Index (BMI) 40.0 and over, adult: Secondary | ICD-10-CM | POA: Diagnosis not present

## 2015-09-18 DIAGNOSIS — G4733 Obstructive sleep apnea (adult) (pediatric): Secondary | ICD-10-CM | POA: Diagnosis not present

## 2015-09-18 DIAGNOSIS — M25562 Pain in left knee: Secondary | ICD-10-CM | POA: Diagnosis not present

## 2015-09-18 DIAGNOSIS — E784 Other hyperlipidemia: Secondary | ICD-10-CM | POA: Diagnosis not present

## 2015-09-18 DIAGNOSIS — G463 Brain stem stroke syndrome: Secondary | ICD-10-CM | POA: Diagnosis not present

## 2015-09-18 DIAGNOSIS — Z0000 Encounter for general adult medical examination without abnormal findings: Secondary | ICD-10-CM | POA: Diagnosis not present

## 2015-09-18 DIAGNOSIS — R7301 Impaired fasting glucose: Secondary | ICD-10-CM | POA: Diagnosis not present

## 2015-09-18 DIAGNOSIS — I10 Essential (primary) hypertension: Secondary | ICD-10-CM | POA: Diagnosis not present

## 2015-09-18 DIAGNOSIS — Z1389 Encounter for screening for other disorder: Secondary | ICD-10-CM | POA: Diagnosis not present

## 2015-09-18 DIAGNOSIS — I872 Venous insufficiency (chronic) (peripheral): Secondary | ICD-10-CM | POA: Diagnosis not present

## 2015-09-18 DIAGNOSIS — F418 Other specified anxiety disorders: Secondary | ICD-10-CM | POA: Diagnosis not present

## 2015-10-17 DIAGNOSIS — R05 Cough: Secondary | ICD-10-CM | POA: Diagnosis not present

## 2015-10-17 DIAGNOSIS — R1110 Vomiting, unspecified: Secondary | ICD-10-CM | POA: Diagnosis not present

## 2015-10-17 DIAGNOSIS — J09X2 Influenza due to identified novel influenza A virus with other respiratory manifestations: Secondary | ICD-10-CM | POA: Diagnosis not present

## 2015-10-17 DIAGNOSIS — J45909 Unspecified asthma, uncomplicated: Secondary | ICD-10-CM | POA: Diagnosis not present

## 2015-10-17 DIAGNOSIS — Z6841 Body Mass Index (BMI) 40.0 and over, adult: Secondary | ICD-10-CM | POA: Diagnosis not present

## 2016-01-06 DIAGNOSIS — M25562 Pain in left knee: Secondary | ICD-10-CM | POA: Diagnosis not present

## 2016-01-06 DIAGNOSIS — Z6841 Body Mass Index (BMI) 40.0 and over, adult: Secondary | ICD-10-CM | POA: Diagnosis not present

## 2016-04-26 DIAGNOSIS — M25562 Pain in left knee: Secondary | ICD-10-CM | POA: Diagnosis not present

## 2016-04-26 DIAGNOSIS — Z23 Encounter for immunization: Secondary | ICD-10-CM | POA: Diagnosis not present

## 2016-04-26 DIAGNOSIS — L989 Disorder of the skin and subcutaneous tissue, unspecified: Secondary | ICD-10-CM | POA: Diagnosis not present

## 2016-04-26 DIAGNOSIS — R7301 Impaired fasting glucose: Secondary | ICD-10-CM | POA: Diagnosis not present

## 2016-04-26 DIAGNOSIS — Z6841 Body Mass Index (BMI) 40.0 and over, adult: Secondary | ICD-10-CM | POA: Diagnosis not present

## 2016-06-28 DIAGNOSIS — D3617 Benign neoplasm of peripheral nerves and autonomic nervous system of trunk, unspecified: Secondary | ICD-10-CM | POA: Diagnosis not present

## 2016-06-28 DIAGNOSIS — L570 Actinic keratosis: Secondary | ICD-10-CM | POA: Diagnosis not present

## 2016-06-28 DIAGNOSIS — D485 Neoplasm of uncertain behavior of skin: Secondary | ICD-10-CM | POA: Diagnosis not present

## 2016-06-28 DIAGNOSIS — L821 Other seborrheic keratosis: Secondary | ICD-10-CM | POA: Diagnosis not present

## 2016-07-05 DIAGNOSIS — M25562 Pain in left knee: Secondary | ICD-10-CM | POA: Diagnosis not present

## 2016-07-05 DIAGNOSIS — R600 Localized edema: Secondary | ICD-10-CM | POA: Diagnosis not present

## 2016-07-05 DIAGNOSIS — Z6841 Body Mass Index (BMI) 40.0 and over, adult: Secondary | ICD-10-CM | POA: Diagnosis not present

## 2016-07-28 ENCOUNTER — Other Ambulatory Visit (HOSPITAL_COMMUNITY): Payer: Self-pay | Admitting: Internal Medicine

## 2016-07-28 ENCOUNTER — Ambulatory Visit (HOSPITAL_COMMUNITY)
Admission: RE | Admit: 2016-07-28 | Discharge: 2016-07-28 | Disposition: A | Discharge status: Home / Self Care | Payer: Medicare Other | Source: Ambulatory Visit | Attending: Vascular Surgery | Admitting: Vascular Surgery

## 2016-07-28 DIAGNOSIS — R600 Localized edema: Secondary | ICD-10-CM | POA: Insufficient documentation

## 2016-08-03 DIAGNOSIS — Z1231 Encounter for screening mammogram for malignant neoplasm of breast: Secondary | ICD-10-CM | POA: Diagnosis not present

## 2016-08-03 DIAGNOSIS — Z803 Family history of malignant neoplasm of breast: Secondary | ICD-10-CM | POA: Diagnosis not present

## 2016-09-03 DIAGNOSIS — Z6841 Body Mass Index (BMI) 40.0 and over, adult: Secondary | ICD-10-CM | POA: Diagnosis not present

## 2016-09-03 DIAGNOSIS — H40023 Open angle with borderline findings, high risk, bilateral: Secondary | ICD-10-CM | POA: Diagnosis not present

## 2016-09-03 DIAGNOSIS — R7301 Impaired fasting glucose: Secondary | ICD-10-CM | POA: Diagnosis not present

## 2016-09-03 DIAGNOSIS — Z1389 Encounter for screening for other disorder: Secondary | ICD-10-CM | POA: Diagnosis not present

## 2016-09-03 DIAGNOSIS — M25561 Pain in right knee: Secondary | ICD-10-CM | POA: Diagnosis not present

## 2016-09-03 DIAGNOSIS — H35371 Puckering of macula, right eye: Secondary | ICD-10-CM | POA: Diagnosis not present

## 2016-09-03 DIAGNOSIS — I10 Essential (primary) hypertension: Secondary | ICD-10-CM | POA: Diagnosis not present

## 2016-09-03 DIAGNOSIS — G4733 Obstructive sleep apnea (adult) (pediatric): Secondary | ICD-10-CM | POA: Diagnosis not present

## 2016-09-03 DIAGNOSIS — H26493 Other secondary cataract, bilateral: Secondary | ICD-10-CM | POA: Diagnosis not present

## 2016-09-03 DIAGNOSIS — Z961 Presence of intraocular lens: Secondary | ICD-10-CM | POA: Diagnosis not present

## 2016-09-03 DIAGNOSIS — M79642 Pain in left hand: Secondary | ICD-10-CM | POA: Diagnosis not present

## 2016-09-03 DIAGNOSIS — H35033 Hypertensive retinopathy, bilateral: Secondary | ICD-10-CM | POA: Diagnosis not present

## 2016-09-23 DIAGNOSIS — Z79899 Other long term (current) drug therapy: Secondary | ICD-10-CM | POA: Diagnosis not present

## 2016-09-23 DIAGNOSIS — J45901 Unspecified asthma with (acute) exacerbation: Secondary | ICD-10-CM | POA: Diagnosis not present

## 2016-09-23 DIAGNOSIS — C8330 Diffuse large B-cell lymphoma, unspecified site: Secondary | ICD-10-CM | POA: Diagnosis not present

## 2016-11-02 DIAGNOSIS — E784 Other hyperlipidemia: Secondary | ICD-10-CM | POA: Diagnosis not present

## 2016-11-02 DIAGNOSIS — I10 Essential (primary) hypertension: Secondary | ICD-10-CM | POA: Diagnosis not present

## 2016-11-02 DIAGNOSIS — M810 Age-related osteoporosis without current pathological fracture: Secondary | ICD-10-CM | POA: Diagnosis not present

## 2016-11-09 DIAGNOSIS — Z6841 Body Mass Index (BMI) 40.0 and over, adult: Secondary | ICD-10-CM | POA: Diagnosis not present

## 2016-11-09 DIAGNOSIS — J45998 Other asthma: Secondary | ICD-10-CM | POA: Diagnosis not present

## 2016-11-09 DIAGNOSIS — I872 Venous insufficiency (chronic) (peripheral): Secondary | ICD-10-CM | POA: Diagnosis not present

## 2016-11-09 DIAGNOSIS — R7301 Impaired fasting glucose: Secondary | ICD-10-CM | POA: Diagnosis not present

## 2016-11-09 DIAGNOSIS — M810 Age-related osteoporosis without current pathological fracture: Secondary | ICD-10-CM | POA: Diagnosis not present

## 2016-11-09 DIAGNOSIS — E784 Other hyperlipidemia: Secondary | ICD-10-CM | POA: Diagnosis not present

## 2016-11-09 DIAGNOSIS — Z0000 Encounter for general adult medical examination without abnormal findings: Secondary | ICD-10-CM | POA: Diagnosis not present

## 2016-11-09 DIAGNOSIS — I10 Essential (primary) hypertension: Secondary | ICD-10-CM | POA: Diagnosis not present

## 2016-11-09 DIAGNOSIS — M25562 Pain in left knee: Secondary | ICD-10-CM | POA: Diagnosis not present

## 2016-11-09 DIAGNOSIS — G4733 Obstructive sleep apnea (adult) (pediatric): Secondary | ICD-10-CM | POA: Diagnosis not present

## 2016-11-09 DIAGNOSIS — F418 Other specified anxiety disorders: Secondary | ICD-10-CM | POA: Diagnosis not present

## 2016-12-28 DIAGNOSIS — Z6841 Body Mass Index (BMI) 40.0 and over, adult: Secondary | ICD-10-CM | POA: Diagnosis not present

## 2016-12-28 DIAGNOSIS — R600 Localized edema: Secondary | ICD-10-CM | POA: Diagnosis not present

## 2016-12-28 DIAGNOSIS — I10 Essential (primary) hypertension: Secondary | ICD-10-CM | POA: Diagnosis not present

## 2016-12-28 DIAGNOSIS — G4733 Obstructive sleep apnea (adult) (pediatric): Secondary | ICD-10-CM | POA: Diagnosis not present

## 2016-12-28 DIAGNOSIS — R7301 Impaired fasting glucose: Secondary | ICD-10-CM | POA: Diagnosis not present

## 2016-12-28 DIAGNOSIS — M25562 Pain in left knee: Secondary | ICD-10-CM | POA: Diagnosis not present

## 2017-01-31 ENCOUNTER — Emergency Department (HOSPITAL_COMMUNITY): Payer: Medicare Other

## 2017-01-31 ENCOUNTER — Inpatient Hospital Stay (HOSPITAL_COMMUNITY)
Admission: EM | Admit: 2017-01-31 | Discharge: 2017-02-10 | DRG: 380 | Disposition: A | Discharge status: Home Health | Payer: Medicare Other | Attending: General Surgery | Admitting: General Surgery

## 2017-01-31 DIAGNOSIS — K275 Chronic or unspecified peptic ulcer, site unspecified, with perforation: Secondary | ICD-10-CM | POA: Insufficient documentation

## 2017-01-31 DIAGNOSIS — Z452 Encounter for adjustment and management of vascular access device: Secondary | ICD-10-CM | POA: Diagnosis not present

## 2017-01-31 DIAGNOSIS — R0600 Dyspnea, unspecified: Secondary | ICD-10-CM

## 2017-01-31 DIAGNOSIS — F418 Other specified anxiety disorders: Secondary | ICD-10-CM | POA: Diagnosis not present

## 2017-01-31 DIAGNOSIS — Z7951 Long term (current) use of inhaled steroids: Secondary | ICD-10-CM | POA: Diagnosis not present

## 2017-01-31 DIAGNOSIS — E7800 Pure hypercholesterolemia, unspecified: Secondary | ICD-10-CM | POA: Diagnosis present

## 2017-01-31 DIAGNOSIS — I272 Pulmonary hypertension, unspecified: Secondary | ICD-10-CM | POA: Diagnosis present

## 2017-01-31 DIAGNOSIS — Z8572 Personal history of non-Hodgkin lymphomas: Secondary | ICD-10-CM | POA: Diagnosis not present

## 2017-01-31 DIAGNOSIS — I472 Ventricular tachycardia: Secondary | ICD-10-CM | POA: Diagnosis not present

## 2017-01-31 DIAGNOSIS — G8929 Other chronic pain: Secondary | ICD-10-CM | POA: Diagnosis present

## 2017-01-31 DIAGNOSIS — I10 Essential (primary) hypertension: Secondary | ICD-10-CM | POA: Diagnosis present

## 2017-01-31 DIAGNOSIS — I340 Nonrheumatic mitral (valve) insufficiency: Secondary | ICD-10-CM | POA: Diagnosis not present

## 2017-01-31 DIAGNOSIS — J90 Pleural effusion, not elsewhere classified: Secondary | ICD-10-CM | POA: Diagnosis not present

## 2017-01-31 DIAGNOSIS — I959 Hypotension, unspecified: Secondary | ICD-10-CM | POA: Diagnosis not present

## 2017-01-31 DIAGNOSIS — Z823 Family history of stroke: Secondary | ICD-10-CM

## 2017-01-31 DIAGNOSIS — I447 Left bundle-branch block, unspecified: Secondary | ICD-10-CM | POA: Diagnosis not present

## 2017-01-31 DIAGNOSIS — K261 Acute duodenal ulcer with perforation: Principal | ICD-10-CM | POA: Diagnosis present

## 2017-01-31 DIAGNOSIS — Z803 Family history of malignant neoplasm of breast: Secondary | ICD-10-CM | POA: Diagnosis not present

## 2017-01-31 DIAGNOSIS — J9811 Atelectasis: Secondary | ICD-10-CM | POA: Diagnosis not present

## 2017-01-31 DIAGNOSIS — Z96651 Presence of right artificial knee joint: Secondary | ICD-10-CM | POA: Diagnosis present

## 2017-01-31 DIAGNOSIS — Z6841 Body Mass Index (BMI) 40.0 and over, adult: Secondary | ICD-10-CM | POA: Diagnosis not present

## 2017-01-31 DIAGNOSIS — K279 Peptic ulcer, site unspecified, unspecified as acute or chronic, without hemorrhage or perforation: Secondary | ICD-10-CM | POA: Diagnosis not present

## 2017-01-31 DIAGNOSIS — R112 Nausea with vomiting, unspecified: Secondary | ICD-10-CM | POA: Diagnosis not present

## 2017-01-31 DIAGNOSIS — F329 Major depressive disorder, single episode, unspecified: Secondary | ICD-10-CM | POA: Diagnosis present

## 2017-01-31 DIAGNOSIS — Z807 Family history of other malignant neoplasms of lymphoid, hematopoietic and related tissues: Secondary | ICD-10-CM | POA: Diagnosis not present

## 2017-01-31 DIAGNOSIS — Z8249 Family history of ischemic heart disease and other diseases of the circulatory system: Secondary | ICD-10-CM

## 2017-01-31 DIAGNOSIS — I471 Supraventricular tachycardia: Secondary | ICD-10-CM | POA: Diagnosis present

## 2017-01-31 DIAGNOSIS — Z8673 Personal history of transient ischemic attack (TIA), and cerebral infarction without residual deficits: Secondary | ICD-10-CM | POA: Diagnosis not present

## 2017-01-31 DIAGNOSIS — K8000 Calculus of gallbladder with acute cholecystitis without obstruction: Secondary | ICD-10-CM | POA: Diagnosis not present

## 2017-01-31 DIAGNOSIS — R1010 Upper abdominal pain, unspecified: Secondary | ICD-10-CM | POA: Diagnosis not present

## 2017-01-31 DIAGNOSIS — K659 Peritonitis, unspecified: Secondary | ICD-10-CM | POA: Diagnosis present

## 2017-01-31 DIAGNOSIS — E8770 Fluid overload, unspecified: Secondary | ICD-10-CM | POA: Diagnosis not present

## 2017-01-31 DIAGNOSIS — J45909 Unspecified asthma, uncomplicated: Secondary | ICD-10-CM | POA: Diagnosis not present

## 2017-01-31 DIAGNOSIS — E876 Hypokalemia: Secondary | ICD-10-CM | POA: Diagnosis not present

## 2017-01-31 DIAGNOSIS — R198 Other specified symptoms and signs involving the digestive system and abdomen: Secondary | ICD-10-CM

## 2017-01-31 DIAGNOSIS — K631 Perforation of intestine (nontraumatic): Secondary | ICD-10-CM | POA: Diagnosis not present

## 2017-01-31 DIAGNOSIS — R0602 Shortness of breath: Secondary | ICD-10-CM

## 2017-01-31 DIAGNOSIS — R1110 Vomiting, unspecified: Secondary | ICD-10-CM | POA: Diagnosis not present

## 2017-01-31 DIAGNOSIS — M549 Dorsalgia, unspecified: Secondary | ICD-10-CM | POA: Diagnosis present

## 2017-01-31 LAB — CBC WITH DIFFERENTIAL/PLATELET
BASOS ABS: 0 10*3/uL (ref 0.0–0.1)
Basophils Relative: 0 %
Eosinophils Absolute: 0.2 10*3/uL (ref 0.0–0.7)
Eosinophils Relative: 2 %
HEMATOCRIT: 43.1 % (ref 36.0–46.0)
HEMOGLOBIN: 13.2 g/dL (ref 12.0–15.0)
LYMPHS PCT: 22 %
Lymphs Abs: 2.9 10*3/uL (ref 0.7–4.0)
MCH: 28.6 pg (ref 26.0–34.0)
MCHC: 30.6 g/dL (ref 30.0–36.0)
MCV: 93.3 fL (ref 78.0–100.0)
MONO ABS: 0.7 10*3/uL (ref 0.1–1.0)
Monocytes Relative: 6 %
NEUTROS ABS: 9.3 10*3/uL — AB (ref 1.7–7.7)
NEUTROS PCT: 70 %
Platelets: 307 10*3/uL (ref 150–400)
RBC: 4.62 MIL/uL (ref 3.87–5.11)
RDW: 13.8 % (ref 11.5–15.5)
WBC: 13.2 10*3/uL — AB (ref 4.0–10.5)

## 2017-01-31 LAB — COMPREHENSIVE METABOLIC PANEL
ALK PHOS: 87 U/L (ref 38–126)
ALT: 17 U/L (ref 14–54)
AST: 24 U/L (ref 15–41)
Albumin: 3.4 g/dL — ABNORMAL LOW (ref 3.5–5.0)
Anion gap: 11 (ref 5–15)
BUN: 12 mg/dL (ref 6–20)
CO2: 32 mmol/L (ref 22–32)
CREATININE: 1.14 mg/dL — AB (ref 0.44–1.00)
Calcium: 9.1 mg/dL (ref 8.9–10.3)
Chloride: 92 mmol/L — ABNORMAL LOW (ref 101–111)
GFR calc Af Amer: 52 mL/min — ABNORMAL LOW (ref >60)
GFR, EST NON AFRICAN AMERICAN: 45 mL/min — AB (ref >60)
GLUCOSE: 103 mg/dL — AB (ref 65–99)
Potassium: 3.4 mmol/L — ABNORMAL LOW (ref 3.5–5.1)
Sodium: 135 mmol/L (ref 135–145)
TOTAL PROTEIN: 7.4 g/dL (ref 6.5–8.1)
Total Bilirubin: 1.4 mg/dL — ABNORMAL HIGH (ref 0.3–1.2)

## 2017-01-31 LAB — TROPONIN I: Troponin I: <0.03 ng/mL (ref <0.03)

## 2017-01-31 LAB — BRAIN NATRIURETIC PEPTIDE: B Natriuretic Peptide: 74.2 pg/mL (ref 0.0–100.0)

## 2017-01-31 LAB — I-STAT CG4 LACTIC ACID, ED: Lactic Acid, Venous: 1.84 mmol/L (ref 0.5–1.9)

## 2017-01-31 LAB — LIPASE, BLOOD: LIPASE: 36 U/L (ref 11–51)

## 2017-01-31 MED ORDER — ONDANSETRON HCL 4 MG/2ML IJ SOLN
4.0000 mg | Freq: Once | INTRAMUSCULAR | Status: AC
  Administered 2017-01-31: 4 mg via INTRAVENOUS
  Filled 2017-01-31: qty 2

## 2017-01-31 MED ORDER — BUDESONIDE 0.25 MG/2ML IN SUSP
0.2500 mg | Freq: Two times a day (BID) | RESPIRATORY_TRACT | Status: DC | PRN
  Filled 2017-01-31: qty 2

## 2017-01-31 MED ORDER — SODIUM CHLORIDE 0.9 % IV BOLUS (SEPSIS)
1000.0000 mL | Freq: Once | INTRAVENOUS | Status: AC
  Administered 2017-01-31: 1000 mL via INTRAVENOUS

## 2017-01-31 MED ORDER — ENOXAPARIN SODIUM 40 MG/0.4ML ~~LOC~~ SOLN
40.0000 mg | SUBCUTANEOUS | Status: DC
  Administered 2017-02-01 – 2017-02-09 (×9): 40 mg via SUBCUTANEOUS
  Filled 2017-01-31 (×10): qty 0.4

## 2017-01-31 MED ORDER — IOPAMIDOL (ISOVUE-300) INJECTION 61%
INTRAVENOUS | Status: AC
  Filled 2017-01-31: qty 30

## 2017-01-31 MED ORDER — POTASSIUM CHLORIDE IN NACL 20-0.9 MEQ/L-% IV SOLN
INTRAVENOUS | Status: DC
  Administered 2017-01-31 – 2017-02-01 (×2): via INTRAVENOUS
  Filled 2017-01-31 (×2): qty 1000

## 2017-01-31 MED ORDER — ACETAMINOPHEN 650 MG RE SUPP: 650.0000 mg | Freq: Four times a day (QID) | RECTAL | Status: DC | PRN

## 2017-01-31 MED ORDER — PIPERACILLIN-TAZOBACTAM 3.375 G IVPB
3.3750 g | Freq: Three times a day (TID) | INTRAVENOUS | Status: DC
  Administered 2017-02-01 – 2017-02-10 (×28): 3.375 g via INTRAVENOUS
  Filled 2017-01-31 (×32): qty 50

## 2017-01-31 MED ORDER — MONTELUKAST SODIUM 10 MG PO TABS
10.0000 mg | ORAL_TABLET | Freq: Every day | ORAL | Status: DC
  Administered 2017-02-01 – 2017-02-10 (×8): 10 mg via ORAL
  Filled 2017-01-31 (×10): qty 1

## 2017-01-31 MED ORDER — IOPAMIDOL (ISOVUE-300) INJECTION 61%
INTRAVENOUS | Status: AC
  Administered 2017-01-31: 100 mL
  Filled 2017-01-31: qty 100

## 2017-01-31 MED ORDER — PIPERACILLIN-TAZOBACTAM 3.375 G IVPB 30 MIN
3.3750 g | Freq: Once | INTRAVENOUS | Status: AC
  Administered 2017-01-31: 3.375 g via INTRAVENOUS
  Filled 2017-01-31: qty 50

## 2017-01-31 MED ORDER — ALBUTEROL SULFATE (2.5 MG/3ML) 0.083% IN NEBU
3.0000 mL | INHALATION_SOLUTION | Freq: Two times a day (BID) | RESPIRATORY_TRACT | Status: DC
  Administered 2017-01-31: 3 mL via RESPIRATORY_TRACT
  Filled 2017-01-31 (×2): qty 3

## 2017-01-31 MED ORDER — PANTOPRAZOLE SODIUM 40 MG IV SOLR
40.0000 mg | Freq: Two times a day (BID) | INTRAVENOUS | Status: DC
  Administered 2017-01-31 – 2017-02-05 (×11): 40 mg via INTRAVENOUS
  Filled 2017-01-31 (×10): qty 40

## 2017-01-31 MED ORDER — ACETAMINOPHEN 325 MG PO TABS
650.0000 mg | ORAL_TABLET | Freq: Four times a day (QID) | ORAL | Status: DC | PRN
  Administered 2017-02-08: 650 mg via ORAL
  Filled 2017-01-31: qty 2

## 2017-01-31 MED ORDER — METOPROLOL TARTRATE 5 MG/5ML IV SOLN: 10.0000 mg | Freq: Four times a day (QID) | INTRAVENOUS | Status: AC

## 2017-01-31 MED ORDER — HYDROMORPHONE HCL 1 MG/ML IJ SOLN
0.5000 mg | INTRAMUSCULAR | Status: DC | PRN
  Administered 2017-02-01 – 2017-02-06 (×8): 0.5 mg via INTRAVENOUS
  Administered 2017-02-06: 1 mg via INTRAVENOUS
  Administered 2017-02-07: 0.5 mg via INTRAVENOUS
  Administered 2017-02-07: 1 mg via INTRAVENOUS
  Administered 2017-02-08: 0.5 mg via INTRAVENOUS
  Filled 2017-01-31: qty 0.5
  Filled 2017-01-31: qty 1
  Filled 2017-01-31: qty 0.5
  Filled 2017-01-31 (×3): qty 1
  Filled 2017-01-31 (×6): qty 0.5

## 2017-01-31 MED ORDER — SODIUM CHLORIDE 0.9 % IV SOLN
INTRAVENOUS | Status: DC
  Administered 2017-01-31 (×3): via INTRAVENOUS

## 2017-01-31 MED ORDER — HYDROMORPHONE HCL 1 MG/ML IJ SOLN
0.5000 mg | Freq: Once | INTRAMUSCULAR | Status: AC
  Administered 2017-01-31: 0.5 mg via INTRAVENOUS
  Filled 2017-01-31: qty 0.5

## 2017-01-31 NOTE — ED Provider Notes (Signed)
[facility] DEPT Provider Note   CSN: [id number] Arrival date & time: 01/31/17  1641     History   Chief Complaint Chief Complaint  Patient presents with  . Abdominal Pain    HPI DESA RECH is a 76 y.o. female.  76 year old female presents with three-day history of right upper quadrant abdominal pain characterized as dull and persistent and radiating to her back. She has had intermittent emesis as well as some subjective chills no reported fever. Denies any anginal type symptoms. Denies any urinary symptoms. Has used Tylenol without relief and no prior history of same. Denies any prior surgical history. States that movement makes her discomfort worse. Nothing makes it better. Called EMS today due to worsening symptoms      Past Medical History:  Diagnosis Date  . Anxiety   . Asthma    hx of  . Back pain   . Chronic constipation   . Depression    hx of  . High cholesterol   . Hypertension   . Lymphoma   . Obesity   . SVT (supraventricular tachycardia)     Patient Active Problem List   Diagnosis Date Noted  . Stroke ([place]) 07/20/2011    Class: Acute  . Nausea and vomiting 07/18/2011  . Lateral nystagmus 07/18/2011    Class: Acute  . Lymphoma in remission 07/18/2011    Class: History of  . Hypertension 07/18/2011    Class: History of  . Obesity 07/18/2011    Class: Chronic  . SVT (supraventricular tachycardia) ([place]) 07/18/2011    Class: History of  . Depression 07/18/2011    Class: History of  . History of hypokalemia 07/18/2011    Class: History of  . Back pain, chronic 07/18/2011    Class: Chronic  . 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    Class: History of  . DISORDER OF BONE AND CARTILAGE UNSPECIFIED 04/29/2008    Past Surgical History:  Procedure Laterality Date  . BACK SURGERY  2009  . BONE BIOPSY    . CESAREAN SECTION    . EYE SURGERY    . JOINT REPLACEMENT    . REPLACEMENT TOTAL KNEE     right    OB History    No data  available       Home Medications    Prior to Admission medications   Medication Sig Start Date End Date Taking? Authorizing Provider  acetaminophen (TYLENOL EX ST ARTHRITIS PAIN) 500 MG tablet Take 1,000 mg by mouth every 6 (six) hours as needed.     [provider]  albuterol (PROVENTIL HFA;VENTOLIN HFA) 108 (90 BASE) MCG/ACT inhaler Inhale 2 puffs into the lungs every 6 (six) hours as needed. wheezing     [provider]  clotrimazole (LOTRIMIN) 1 % cream Apply 1 application topically as needed. For under the breasts     [provider]  DULoxetine (CYMBALTA) 60 MG capsule Take 60 mg by mouth daily.      [provider]  fluticasone (FLOVENT HFA) 110 MCG/ACT inhaler Inhale 1 puff into the lungs 2 (two) times daily.      [provider]  furosemide (LASIX) 40 MG tablet One tablet 2 x a week or as directed 07/22/11   Leanna Battles, MD  loratadine (CLARITIN) 10 MG tablet Take 10 mg by mouth daily.      [provider]  lubiprostone (AMITIZA) 24 MCG capsule Take 24 mcg by mouth daily.     [provider]  metoprolol (LOPRESSOR) 50 MG tablet  06/16/11   Burtis Junes, NP  montelukast (SINGULAIR) 10 MG tablet  12/29/12   [provider]  Olmesartan-Amlodipine-HCTZ (TRIBENZOR) 40-5-12.5 MG TABS Take 1 tablet by mouth daily.      [provider]  omeprazole (PRILOSEC OTC) 20 MG tablet Take 20 mg by mouth daily. Only as needed    [provider]  pravastatin (PRAVACHOL) 20 MG tablet Take 20 mg by mouth daily.      [provider]  Vitamin D, Ergocalciferol, (DRISDOL) 50000 UNITS CAPS Take 50,000 Units by mouth every 7 (seven) days. Every saturday     [provider]    Family History Family History  Problem Relation Age of Onset  . Lymphoma Mother   . Breast cancer Mother   . Coronary artery disease Father 74       died  . Stroke Father     Social History Social History    Substance Use Topics  . Smoking status: Never Smoker  . Smokeless tobacco: Not on file  . Alcohol use No     Allergies   Patient has no known allergies.   Review of Systems Review of Systems  All other systems reviewed and are negative.    Physical Exam Updated Vital Signs There were no vitals taken for this visit.  Physical Exam  Constitutional: She is oriented to person, place, and time. She appears well-developed and well-nourished.  Non-toxic appearance. No distress.  HENT:  Head: Normocephalic and atraumatic.  Eyes: Conjunctivae, EOM and lids are normal. Pupils are equal, round, and reactive to light.  Neck: Normal range of motion. Neck supple. No tracheal deviation present. No thyroid mass present.  Cardiovascular: Normal rate, regular rhythm and normal heart sounds.  Exam reveals no gallop.   No murmur heard. Pulmonary/Chest: Effort normal and breath sounds normal. No stridor. No respiratory distress. She has no decreased breath sounds. She has no wheezes. She has no rhonchi. She has no rales.  Abdominal: Soft. Normal appearance and bowel sounds are normal. She exhibits no mass. There is tenderness in the right upper quadrant and epigastric area. There is guarding. There is no rigidity, no rebound and no CVA tenderness.    Musculoskeletal: Normal range of motion. She exhibits no edema or tenderness.  Neurological: She is alert and oriented to person, place, and time. She has normal strength. No cranial nerve deficit or sensory deficit. GCS eye subscore is 4. GCS verbal subscore is 5. GCS motor subscore is 6.  Skin: Skin is warm and dry. No abrasion and no rash noted.  Psychiatric: She has a normal mood and affect. Her speech is normal and behavior is normal.  Nursing note and vitals reviewed.    ED Treatments / Results  Labs (all labs ordered are listed, but only abnormal results are displayed) Labs Reviewed  CULTURE, BLOOD (ROUTINE X 2)  CULTURE, BLOOD (ROUTINE  X 2)  CBC WITH DIFFERENTIAL/PLATELET  COMPREHENSIVE METABOLIC PANEL  LIPASE, BLOOD  I-STAT CG4 LACTIC ACID, ED    EKG  EKG Interpretation None       Radiology No results found.  Procedures Procedures (including critical care time)  Medications Ordered in ED Medications  sodium chloride 0.9 % bolus 1,000 mL (not administered)  0.9 %  sodium chloride infusion (not administered)  HYDROmorphone (DILAUDID) injection 0.5 mg (not administered)     Initial Impression / Assessment and Plan / ED Course  I have reviewed the  triage vital signs and the nursing notes.  Pertinent labs & imaging results that were available during my care of the patient were reviewed by me and considered in my medical decision making (see chart for details).     Patient evidence of pneumoperitoneum on her abdominal CT. Will start on IV antibiotics. Spoke with Dr. Hulen Skains from general surgery will come and see the patient.  Final Clinical Impressions(s) / ED Diagnoses   Final diagnoses:  None    New Prescriptions New Prescriptions   No medications on file     Lacretia Leigh, MD 01/31/17 2108

## 2017-01-31 NOTE — H&P (Signed)
[name] is an 76 y.o. female.   Chief Complaint: Abdominal pain HPI: Problems started on Friday with nausea and vomiting, no fevers or chills, seems to get better on Saturday and Sunday, started again with nausea today followed by abdominal pain.  Came to the ED where plain X-rays did not demonstrate any significant problems, but CT scan demonstrated possible perforated duodenal ulcer without a lot of free fluid, some bubbles of air around the distal stomach, and inflammatory changes.  No contrast leakage noted although she did get oral contrast.  Past Medical History:  Diagnosis Date  . Anxiety   . Asthma    hx of  . Back pain   . Chronic constipation   . Depression    hx of  . High cholesterol   . Hypertension   . Lymphoma   . Obesity   . SVT (supraventricular tachycardia)     Past Surgical History:  Procedure Laterality Date  . BACK SURGERY  2009  . BONE BIOPSY    . CESAREAN SECTION    . EYE SURGERY    . JOINT REPLACEMENT    . REPLACEMENT TOTAL KNEE     right    Family History  Problem Relation Age of Onset  . Lymphoma Mother   . Breast cancer Mother   . Coronary artery disease Father 42       died  . Stroke Father    Social History:  reports that she has never smoked. She does not have any smokeless tobacco history on file. She reports that she does not drink alcohol or use drugs.  Allergies: No Known Allergies   (Not in a hospital admission)  Results for orders placed or performed during the hospital encounter of 01/31/17 (from the past 48 hour(s))  CBC with Differential/Platelet     Status: Abnormal   Collection Time: 01/31/17  4:55 PM  Result Value Ref Range   WBC 13.2 (H) 4.0 - 10.5 K/uL   RBC 4.62 3.87 - 5.11 MIL/uL   Hemoglobin 13.2 12.0 - 15.0 g/dL   HCT 43.1 36.0 - 46.0 %   MCV 93.3 78.0 - 100.0 fL   MCH 28.6 26.0 - 34.0 pg   MCHC 30.6 30.0 - 36.0 g/dL   RDW 13.8 11.5 - 15.5 %   Platelets 307 150 - 400 K/uL   Neutrophils Relative % 70 %   Neutro Abs 9.3 (H) 1.7 - 7.7 K/uL   Lymphocytes Relative 22 %   Lymphs Abs 2.9 0.7 - 4.0 K/uL   Monocytes Relative 6 %   Monocytes Absolute 0.7 0.1 - 1.0 K/uL   Eosinophils Relative 2 %   Eosinophils Absolute 0.2 0.0 - 0.7 K/uL   Basophils Relative 0 %   Basophils Absolute 0.0 0.0 - 0.1 K/uL  Comprehensive metabolic panel     Status: Abnormal   Collection Time: 01/31/17  4:55 PM  Result Value Ref Range   Sodium 135 135 - 145 mmol/L   Potassium 3.4 (L) 3.5 - 5.1 mmol/L   Chloride 92 (L) 101 - 111 mmol/L   CO2 32 22 - 32 mmol/L   Glucose, Bld 103 (H) 65 - 99 mg/dL   BUN 12 6 - 20 mg/dL   Creatinine, Ser 1.14 (H) 0.44 - 1.00 mg/dL   Calcium 9.1 8.9 - 10.3 mg/dL   Total Protein 7.4 6.5 - 8.1 g/dL   Albumin 3.4 (L) 3.5 - 5.0 g/dL   AST 24 15 - 41 U/L  ALT 17 14 - 54 U/L   Alkaline Phosphatase 87 38 - 126 U/L   Total Bilirubin 1.4 (H) 0.3 - 1.2 mg/dL   GFR calc non Af Amer 45 (L) >60 mL/min   GFR calc Af Amer 52 (L) >60 mL/min    Comment: (NOTE) The eGFR has been calculated using the CKD EPI equation. This calculation has not been validated in all clinical situations. eGFR's persistently <60 mL/min signify possible Chronic Kidney Disease.    Anion gap 11 5 - 15  Lipase, blood     Status: None   Collection Time: 01/31/17  4:55 PM  Result Value Ref Range   Lipase 36 11 - 51 U/L  Brain natriuretic peptide     Status: None   Collection Time: 01/31/17  4:55 PM  Result Value Ref Range   B Natriuretic Peptide 74.2 0.0 - 100.0 pg/mL  I-Stat CG4 Lactic Acid, ED     Status: None   Collection Time: 01/31/17  5:15 PM  Result Value Ref Range   Lactic Acid, Venous 1.84 0.5 - 1.9 mmol/L  Troponin I     Status: None   Collection Time: 01/31/17  5:51 PM  Result Value Ref Range   Troponin I <0.03 <0.03 ng/mL   Ct Abdomen Pelvis W Contrast  Result Date: 01/31/2017 CLINICAL DATA:  76 year old female with acute abdominal and pelvic pain with nausea and vomiting for 4 days. History of  lymphoma. EXAM: CT ABDOMEN AND PELVIS WITH CONTRAST TECHNIQUE: Multidetector CT imaging of the abdomen and pelvis was performed using the standard protocol following bolus administration of intravenous contrast. CONTRAST:  100 cc intravenous Isovue-300 COMPARISON:  06/26/2008 PET-CT and prior studies FINDINGS: Lower chest: Mild bibasilar atelectasis/ scarring identified. Hepatobiliary: Cholelithiasis noted without CT evidence of acute cholecystitis. The liver is unremarkable. Pancreas: Unremarkable Spleen: Unremarkable Adrenals/Urinary Tract: The kidneys, adrenal glands and bladder are unremarkable except for mild bilateral renal atrophy. Stomach/Bowel: There is wall thickening of the distal stomach/proximal duodenum with adjacent inflammation and a few tiny foci of free air, likely related to perforated peptic ulcer. No abscess or bowel obstruction identified. No other bowel wall thickening identified. Vascular/Lymphatic: Aortic atherosclerosis. No enlarged abdominal or pelvic lymph nodes. Reproductive: Status post hysterectomy. No adnexal masses. Other: A tiny amount of free fluid adjacent to the liver noted. Musculoskeletal: No definite acute abnormality. Degenerative and surgical changes in the lumbar spine with multiple compression fractures noted. Vertebral augmentation changes in the lower thoracic spine noted. IMPRESSION: Wall thickening of the distal stomach/ proximal duodenum with adjacent inflammation and small foci of pneumoperitoneum - likely related to perforated peptic ulcer. No bowel obstruction or abscess. Cholelithiasis without CT evidence of acute cholecystitis. Aortic Atherosclerosis (ICD10-I70.0). Critical Value/emergent results were called by telephone at the time of interpretation on 01/31/2017 at 8:51 pm to Dr. Lacretia Leigh , who verbally acknowledged these results. Electronically Signed   By: Margarette Canada M.D.   On: 01/31/2017 20:52   Dg Abd Acute W/chest  Result Date: 01/31/2017 CLINICAL  DATA:  History of lymphoma.  Abdominal pain EXAM: DG ABDOMEN ACUTE W/ 1V CHEST COMPARISON:  PET-CT scan 06/26/2008 FINDINGS: Posterior thoracic and lumbar spinal fusion. Low lung volumes. Fine linear interstitial pattern present. No dilated loops of large or small bowel. No significant stool within the colon. Gallstones noted within the gallbladder not changed from prior. IMPRESSION: 1. Low lung volumes and mild interstitial edema. 2. No bowel obstruction.  No evidence of constipation. 3. Cholelithiasis. Electronically Signed  By: Suzy Bouchard M.D.   On: 01/31/2017 18:15    Review of Systems  Constitutional: Positive for chills and fever.  Gastrointestinal: Positive for abdominal pain, heartburn, nausea and vomiting.  All other systems reviewed and are negative.   Blood pressure (!) 120/59, pulse (!) 101, temperature (!) 100.4 F (38 C), temperature source Rectal, resp. rate (!) 21, height 5' (1.524 m), weight 113.4 kg (250 lb), SpO2 98 %. Physical Exam  Nursing note and vitals reviewed. Constitutional: She is oriented to person, place, and time. She appears well-developed.  Morbidly obese  HENT:  Head: Normocephalic and atraumatic.  Eyes: Pupils are equal, round, and reactive to light.  Cardiovascular: Normal rate, regular rhythm and normal heart sounds.   Respiratory: Effort normal and breath sounds normal.  GI: Soft. She exhibits distension (mild). Bowel sounds are decreased. There is tenderness (mild to moderate) in the right upper quadrant and epigastric area. There is guarding. There is no rigidity and no rebound.  Musculoskeletal: Normal range of motion.  Neurological: She is alert and oriented to person, place, and time. She has normal reflexes.  Skin: Skin is warm and dry.  Psychiatric: She has a normal mood and affect. Her behavior is normal. Judgment and thought content normal.     Assessment/Plan Contained perforated duodenal ulcer with small bubles of free air, no  diffuse peritonitis, no free fluid on contrast extravasation.  Pain is controlled, but she has received IV pain medications.  Without a significant amount of free air or fluid, and only small l bubbles of air outside the lumen of the stomach and duodenum, I believe that her ulcer may be contained and may heal witout surgical intervention.  Will keep the patient NPO.  Place on high dose H2 blockers, IV antibiotics for area where leakage may have already occurred.  UGI with water contrast media in the AM.  Reassess and at anytime she should change significantly clinically we may need to go to the OR urgently.  Judeth Horn, MD 01/31/2017, 9:55 PM

## 2017-01-31 NOTE — ED Triage Notes (Signed)
Per EMS- pt here for sudden onset abdominal pain this afternoon after drinking milk. Pt in RBBB. unknown history. Pt given 4 zofran and 100 mcg fentanyl 18 G PIV placed in field.   Upon assessment- pt states nausea since Friday. Right abdominal pain worse today. PT is 83% on room air. Placed on 2L O2 via nasal cannula. EKG obtained. MD aware.

## 2017-01-31 NOTE — ED Notes (Addendum)
Patient transported to CT 

## 2017-02-01 ENCOUNTER — Inpatient Hospital Stay (HOSPITAL_COMMUNITY): Payer: Medicare Other

## 2017-02-01 ENCOUNTER — Inpatient Hospital Stay (HOSPITAL_COMMUNITY): Payer: Medicare Other | Admitting: Certified Registered"

## 2017-02-01 ENCOUNTER — Encounter (HOSPITAL_COMMUNITY): Admission: EM | Disposition: A | Discharge status: Home Health | Payer: Self-pay | Source: Home / Self Care

## 2017-02-01 ENCOUNTER — Encounter (HOSPITAL_COMMUNITY): Payer: Self-pay

## 2017-02-01 HISTORY — PX: LAPAROSCOPY: SHX197

## 2017-02-01 LAB — BASIC METABOLIC PANEL
Anion gap: 8 (ref 5–15)
BUN: 15 mg/dL (ref 6–20)
CALCIUM: 8.5 mg/dL — AB (ref 8.9–10.3)
CHLORIDE: 96 mmol/L — AB (ref 101–111)
CO2: 30 mmol/L (ref 22–32)
CREATININE: 1.08 mg/dL — AB (ref 0.44–1.00)
GFR calc non Af Amer: 48 mL/min — ABNORMAL LOW (ref >60)
GFR, EST AFRICAN AMERICAN: 56 mL/min — AB (ref >60)
Glucose, Bld: 140 mg/dL — ABNORMAL HIGH (ref 65–99)
Potassium: 3.2 mmol/L — ABNORMAL LOW (ref 3.5–5.1)
SODIUM: 134 mmol/L — AB (ref 135–145)

## 2017-02-01 LAB — CBC
HCT: 34.6 % — ABNORMAL LOW (ref 36.0–46.0)
Hemoglobin: 10.9 g/dL — ABNORMAL LOW (ref 12.0–15.0)
MCH: 29.1 pg (ref 26.0–34.0)
MCHC: 31.5 g/dL (ref 30.0–36.0)
MCV: 92.3 fL (ref 78.0–100.0)
PLATELETS: 284 10*3/uL (ref 150–400)
RBC: 3.75 MIL/uL — AB (ref 3.87–5.11)
RDW: 13.6 % (ref 11.5–15.5)
WBC: 14.2 10*3/uL — AB (ref 4.0–10.5)

## 2017-02-01 LAB — PROTIME-INR
INR: 1.13
PROTHROMBIN TIME: 14.6 s (ref 11.4–15.2)

## 2017-02-01 LAB — MRSA PCR SCREENING: MRSA by PCR: NEGATIVE

## 2017-02-01 LAB — PREPARE RBC (CROSSMATCH)

## 2017-02-01 SURGERY — LAPAROSCOPY, DIAGNOSTIC
Anesthesia: General | Site: Abdomen

## 2017-02-01 MED ORDER — SUGAMMADEX SODIUM 200 MG/2ML IV SOLN
INTRAVENOUS | Status: DC | PRN
  Administered 2017-02-01: 200 mg via INTRAVENOUS
  Administered 2017-02-01: 50 mg via INTRAVENOUS

## 2017-02-01 MED ORDER — KCL IN DEXTROSE-NACL 20-5-0.9 MEQ/L-%-% IV SOLN
INTRAVENOUS | Status: DC
  Administered 2017-02-01 – 2017-02-06 (×8): via INTRAVENOUS
  Filled 2017-02-01 (×14): qty 1000

## 2017-02-01 MED ORDER — CHLORHEXIDINE GLUCONATE CLOTH 2 % EX PADS
6.0000 | MEDICATED_PAD | Freq: Once | CUTANEOUS | Status: AC
  Administered 2017-02-01: 6 via TOPICAL

## 2017-02-01 MED ORDER — IOPAMIDOL (ISOVUE-300) INJECTION 61%
INTRAVENOUS | Status: AC
  Administered 2017-02-01: 125 mL via ORAL
  Filled 2017-02-01: qty 150

## 2017-02-01 MED ORDER — DEXTROSE 5 % IV SOLN: 2.0000 g | Freq: Once | INTRAVENOUS | Status: DC

## 2017-02-01 MED ORDER — PHENYLEPHRINE HCL 10 MG/ML IJ SOLN
0.0000 ug/min | INTRAMUSCULAR | Status: DC
  Filled 2017-02-01 (×4): qty 1

## 2017-02-01 MED ORDER — LEVALBUTEROL HCL 0.63 MG/3ML IN NEBU
0.6300 mg | INHALATION_SOLUTION | Freq: Two times a day (BID) | RESPIRATORY_TRACT | Status: DC
  Administered 2017-02-01 – 2017-02-04 (×6): 0.63 mg via RESPIRATORY_TRACT
  Filled 2017-02-01 (×6): qty 3

## 2017-02-01 MED ORDER — NOREPINEPHRINE BITARTRATE 1 MG/ML IV SOLN
0.0000 ug/min | INTRAVENOUS | Status: DC
  Filled 2017-02-01 (×2): qty 4

## 2017-02-01 MED ORDER — SUCCINYLCHOLINE CHLORIDE 200 MG/10ML IV SOSY
PREFILLED_SYRINGE | INTRAVENOUS | Status: AC
  Filled 2017-02-01: qty 20

## 2017-02-01 MED ORDER — FENTANYL CITRATE (PF) 100 MCG/2ML IJ SOLN
INTRAMUSCULAR | Status: DC | PRN
  Administered 2017-02-01 (×2): 50 ug via INTRAVENOUS

## 2017-02-01 MED ORDER — SODIUM CHLORIDE 0.9 % IV SOLN
Freq: Once | INTRAVENOUS | Status: DC
Start: 2017-02-01 — End: 2017-02-10

## 2017-02-01 MED ORDER — KETAMINE HCL-SODIUM CHLORIDE 100-0.9 MG/10ML-% IV SOSY
PREFILLED_SYRINGE | INTRAVENOUS | Status: AC
  Filled 2017-02-01: qty 10

## 2017-02-01 MED ORDER — LEVALBUTEROL HCL 0.63 MG/3ML IN NEBU
0.6300 mg | INHALATION_SOLUTION | Freq: Four times a day (QID) | RESPIRATORY_TRACT | Status: DC | PRN
  Administered 2017-02-03 – 2017-02-04 (×2): 0.63 mg via RESPIRATORY_TRACT
  Filled 2017-02-01 (×3): qty 3

## 2017-02-01 MED ORDER — SUCCINYLCHOLINE CHLORIDE 200 MG/10ML IV SOSY
PREFILLED_SYRINGE | INTRAVENOUS | Status: DC | PRN
  Administered 2017-02-01: 80 mg via INTRAVENOUS

## 2017-02-01 MED ORDER — ORAL CARE MOUTH RINSE
15.0000 mL | Freq: Two times a day (BID) | OROMUCOSAL | Status: DC
  Administered 2017-02-01 – 2017-02-05 (×5): 15 mL via OROMUCOSAL

## 2017-02-01 MED ORDER — BUPIVACAINE-EPINEPHRINE 0.25% -1:200000 IJ SOLN
INTRAMUSCULAR | Status: DC | PRN
  Administered 2017-02-01: 2 mL

## 2017-02-01 MED ORDER — ONDANSETRON HCL 4 MG/2ML IJ SOLN
INTRAMUSCULAR | Status: AC
  Filled 2017-02-01: qty 2

## 2017-02-01 MED ORDER — SUGAMMADEX SODIUM 200 MG/2ML IV SOLN
INTRAVENOUS | Status: AC
  Filled 2017-02-01: qty 2

## 2017-02-01 MED ORDER — FENTANYL CITRATE (PF) 250 MCG/5ML IJ SOLN
INTRAMUSCULAR | Status: AC
  Filled 2017-02-01: qty 5

## 2017-02-01 MED ORDER — IOPAMIDOL (ISOVUE-300) INJECTION 61%
150.0000 mL | Freq: Once | INTRAVENOUS | Status: AC | PRN
  Administered 2017-02-01: 125 mL via ORAL

## 2017-02-01 MED ORDER — 0.9 % SODIUM CHLORIDE (POUR BTL) OPTIME
TOPICAL | Status: DC | PRN
  Administered 2017-02-01: 2000 mL

## 2017-02-01 MED ORDER — SODIUM CHLORIDE 0.9 % IV SOLN
1000.0000 mL | Freq: Once | INTRAVENOUS | Status: AC
  Administered 2017-02-01: 1000 mL via INTRAVENOUS

## 2017-02-01 MED ORDER — ONDANSETRON HCL 4 MG/2ML IJ SOLN
INTRAMUSCULAR | Status: DC | PRN
  Administered 2017-02-01: 4 mg via INTRAVENOUS

## 2017-02-01 MED ORDER — KETAMINE HCL 10 MG/ML IJ SOLN
INTRAMUSCULAR | Status: DC | PRN
  Administered 2017-02-01: 60 mg via INTRAVENOUS

## 2017-02-01 MED ORDER — CEFAZOLIN SODIUM 1 G IJ SOLR
INTRAMUSCULAR | Status: AC
  Filled 2017-02-01: qty 20

## 2017-02-01 MED ORDER — DEXTROSE-NACL 5-0.9 % IV SOLN: INTRAVENOUS | Status: DC

## 2017-02-01 MED ORDER — LACTATED RINGERS IV SOLN
INTRAVENOUS | Status: DC | PRN
  Administered 2017-02-01: 15:00:00 via INTRAVENOUS

## 2017-02-01 MED ORDER — ROCURONIUM BROMIDE 10 MG/ML (PF) SYRINGE
PREFILLED_SYRINGE | INTRAVENOUS | Status: DC | PRN
  Administered 2017-02-01: 30 mg via INTRAVENOUS

## 2017-02-01 MED ORDER — ETOMIDATE 2 MG/ML IV SOLN
INTRAVENOUS | Status: DC | PRN
  Administered 2017-02-01: 12 mg via INTRAVENOUS

## 2017-02-01 MED ORDER — ETOMIDATE 2 MG/ML IV SOLN
INTRAVENOUS | Status: AC
  Filled 2017-02-01: qty 10

## 2017-02-01 MED ORDER — CEFAZOLIN SODIUM 1 G IJ SOLR
INTRAMUSCULAR | Status: DC | PRN
  Administered 2017-02-01: 2 g via INTRAMUSCULAR

## 2017-02-01 MED ORDER — SODIUM CHLORIDE 0.9 % IV SOLN
INTRAVENOUS | Status: DC | PRN
  Administered 2017-02-01: 14:00:00 via INTRAVENOUS

## 2017-02-01 MED ORDER — SODIUM CHLORIDE 0.9 % IV BOLUS (SEPSIS)
1000.0000 mL | Freq: Once | INTRAVENOUS | Status: AC
  Administered 2017-02-01: 1000 mL via INTRAVENOUS

## 2017-02-01 MED ORDER — CHLORHEXIDINE GLUCONATE CLOTH 2 % EX PADS
6.0000 | MEDICATED_PAD | Freq: Once | CUTANEOUS | Status: AC
  Administered 2017-02-02: 6 via TOPICAL

## 2017-02-01 MED ORDER — BUPIVACAINE-EPINEPHRINE (PF) 0.25% -1:200000 IJ SOLN
INTRAMUSCULAR | Status: AC
  Filled 2017-02-01: qty 30

## 2017-02-01 SURGICAL SUPPLY — 52 items
BLADE CLIPPER SURG (BLADE) IMPLANT
CANISTER SUCT 3000ML PPV (MISCELLANEOUS) ×3 IMPLANT
CHLORAPREP W/TINT 26ML (MISCELLANEOUS) ×3 IMPLANT
COVER SURGICAL LIGHT HANDLE (MISCELLANEOUS) ×3 IMPLANT
DECANTER SPIKE VIAL GLASS SM (MISCELLANEOUS) IMPLANT
DERMABOND ADVANCED (GAUZE/BANDAGES/DRESSINGS) ×1
DERMABOND ADVANCED .7 DNX12 (GAUZE/BANDAGES/DRESSINGS) ×2 IMPLANT
DRAPE LAPAROSCOPIC ABDOMINAL (DRAPES) IMPLANT
DRAPE WARM FLUID 44X44 (DRAPE) ×3 IMPLANT
DRSG OPSITE POSTOP 4X10 (GAUZE/BANDAGES/DRESSINGS) IMPLANT
DRSG OPSITE POSTOP 4X8 (GAUZE/BANDAGES/DRESSINGS) IMPLANT
ELECT BLADE 6.5 EXT (BLADE) ×3 IMPLANT
ELECT CAUTERY BLADE 6.4 (BLADE) ×3 IMPLANT
ELECT REM PT RETURN 9FT ADLT (ELECTROSURGICAL) ×3
ELECTRODE REM PT RTRN 9FT ADLT (ELECTROSURGICAL) ×2 IMPLANT
GLOVE BIO SURGEON STRL SZ8 (GLOVE) ×3 IMPLANT
GLOVE BIOGEL PI IND STRL 8 (GLOVE) ×2 IMPLANT
GLOVE BIOGEL PI INDICATOR 8 (GLOVE) ×1
GOWN STRL REUS W/ TWL LRG LVL3 (GOWN DISPOSABLE) ×4 IMPLANT
GOWN STRL REUS W/ TWL XL LVL3 (GOWN DISPOSABLE) ×2 IMPLANT
GOWN STRL REUS W/TWL LRG LVL3 (GOWN DISPOSABLE) ×2
GOWN STRL REUS W/TWL XL LVL3 (GOWN DISPOSABLE) ×1
HANDLE SUCTION POOLE (INSTRUMENTS) ×2 IMPLANT
KIT BASIN OR (CUSTOM PROCEDURE TRAY) ×3 IMPLANT
KIT ROOM TURNOVER OR (KITS) ×6 IMPLANT
LIGASURE IMPACT 36 18CM CVD LR (INSTRUMENTS) IMPLANT
NS IRRIG 1000ML POUR BTL (IV SOLUTION) ×6 IMPLANT
PACK GENERAL/GYN (CUSTOM PROCEDURE TRAY) ×3 IMPLANT
PAD ARMBOARD 7.5X6 YLW CONV (MISCELLANEOUS) ×3 IMPLANT
SCISSORS LAP 5X35 DISP (ENDOMECHANICALS) IMPLANT
SET IRRIG TUBING LAPAROSCOPIC (IRRIGATION / IRRIGATOR) ×3 IMPLANT
SLEEVE ENDOPATH XCEL 5M (ENDOMECHANICALS) ×9 IMPLANT
SPECIMEN JAR LARGE (MISCELLANEOUS) IMPLANT
SPONGE LAP 18X18 X RAY DECT (DISPOSABLE) IMPLANT
STAPLER VISISTAT 35W (STAPLE) ×3 IMPLANT
SUCTION POOLE HANDLE (INSTRUMENTS) ×3
SUCTION POOLE TIP (SUCTIONS) ×3 IMPLANT
SUT MNCRL AB 4-0 PS2 18 (SUTURE) ×6 IMPLANT
SUT PDS AB 1 TP1 96 (SUTURE) ×6 IMPLANT
SUT VIC AB 2-0 SH 18 (SUTURE) ×3 IMPLANT
SUT VIC AB 3-0 SH 18 (SUTURE) ×3 IMPLANT
SUT VICRYL AB 2 0 TIES (SUTURE) ×6 IMPLANT
SUT VICRYL AB 3 0 TIES (SUTURE) ×3 IMPLANT
TOWEL OR 17X24 6PK STRL BLUE (TOWEL DISPOSABLE) IMPLANT
TOWEL OR 17X26 10 PK STRL BLUE (TOWEL DISPOSABLE) IMPLANT
TRAY FOLEY W/METER SILVER 16FR (SET/KITS/TRAYS/PACK) IMPLANT
TRAY LAPAROSCOPIC MC (CUSTOM PROCEDURE TRAY) ×3 IMPLANT
TROCAR XCEL BLUNT TIP 100MML (ENDOMECHANICALS) IMPLANT
TROCAR XCEL NON-BLD 11X100MML (ENDOMECHANICALS) IMPLANT
TROCAR XCEL NON-BLD 5MMX100MML (ENDOMECHANICALS) ×3 IMPLANT
TUBING INSUFFLATION (TUBING) ×3 IMPLANT
YANKAUER SUCT BULB TIP NO VENT (SUCTIONS) IMPLANT

## 2017-02-01 NOTE — Anesthesia Procedure Notes (Signed)
Arterial Line Insertion Start/End7/09/2016 3:10 PM Performed by: Everlean Cherry A  Preanesthetic checklist: patient identified, IV checked, site marked, risks and benefits discussed, surgical consent, monitors and equipment checked, pre-op evaluation and anesthesia consent Right, radial was placed Catheter size: 20 G Hand hygiene performed  and maximum sterile barriers used   Attempts: 1 Procedure performed without using ultrasound guided technique. Ultrasound Notes:anatomy identified and no ultrasound evidence of intravascular and/or intraneural injection Following insertion, dressing applied and Biopatch. Post procedure assessment: normal  Patient tolerated the procedure well with no immediate complications. Additional procedure comments: Placed after GETA.Marland Kitchen

## 2017-02-01 NOTE — Interval H&P Note (Signed)
History and Physical Interval Note:  02/01/2017 2:55 PM  [name]  has presented today for surgery, with the diagnosis of PEPTIC ULCER  The various methods of treatment have been discussed with the patient and family. After consideration of risks, benefits and other options for treatment, the patient has consented to  Procedure(s): LAPAROSCOPY DIAGNOSTIC (N/A) EXPLORATORY LAPAROTOMY (N/A) as a surgical intervention .  The patient's history has been reviewed, patient examined, no change in status, stable for surgery.  I have reviewed the patient's chart and labs.  Questions were answered to the patient's satisfaction.     Daisy Mcneel A.

## 2017-02-01 NOTE — Anesthesia Preprocedure Evaluation (Signed)
Anesthesia Evaluation  Patient identified by MRN, date of birth, ID band Patient awake    Reviewed: Allergy & Precautions, NPO status , Patient's Chart, lab work & pertinent test results  History of Anesthesia Complications Negative for: history of anesthetic complications  Airway Mallampati: II  TM Distance: >3 FB Neck ROM: Full    Dental  (+) Partial Upper, Partial Lower   Pulmonary asthma ,    breath sounds clear to auscultation       Cardiovascular hypertension, Pt. on medications  Rhythm:Regular     Neuro/Psych PSYCHIATRIC DISORDERS Anxiety Depression CVA    GI/Hepatic Neg liver ROS, PUD,   Endo/Other  Morbid obesity  Renal/GU negative Renal ROS     Musculoskeletal   Abdominal   Peds  Hematology  (+) anemia ,   Anesthesia Other Findings Lymphoma   Reproductive/Obstetrics                             Anesthesia Physical Anesthesia Plan  ASA: IV and emergent  Anesthesia Plan: General   Post-op Pain Management:    Induction: Intravenous  PONV Risk Score and Plan: 3 and Ondansetron, Dexamethasone and Treatment may vary due to age or medical condition  Airway Management Planned: Oral ETT  Additional Equipment: Arterial line, CVP and Ultrasound Guidance Line Placement  Intra-op Plan:   Post-operative Plan: Extubation in OR and Possible Post-op intubation/ventilation  Informed Consent: I have reviewed the patients History and Physical, chart, labs and discussed the procedure including the risks, benefits and alternatives for the proposed anesthesia with the patient or authorized representative who has indicated his/her understanding and acceptance.   Dental advisory given  Plan Discussed with: CRNA and Surgeon  Anesthesia Plan Comments:         Anesthesia Quick Evaluation

## 2017-02-01 NOTE — Anesthesia Procedure Notes (Signed)
Central Venous Catheter Insertion Performed by: Oleta Mouse, anesthesiologist Start/End7/09/2016 3:18 AM, 02/01/2017 3:24 AM Patient location: OR. Preanesthetic checklist: patient identified, IV checked, site marked, risks and benefits discussed, surgical consent, monitors and equipment checked, pre-op evaluation, timeout performed and anesthesia consent Position: supine Hand hygiene performed  and maximum sterile barriers used  Catheter size: 8 Fr Total catheter length 16. Central line was placed.Double lumen Procedure performed using ultrasound guided technique. Ultrasound Notes:anatomy identified, needle tip was noted to be adjacent to the nerve/plexus identified, no ultrasound evidence of intravascular and/or intraneural injection and image(s) printed for medical record Attempts: 1 Following insertion, dressing applied, line sutured and Biopatch. Post procedure assessment: blood return through all ports, free fluid flow and no air  Patient tolerated the procedure well with no immediate complications.

## 2017-02-01 NOTE — H&P (View-Only) (Signed)
Pt with more hypotension despite fluid therapy and increasing abdominal pain Recommend laparoscopy possible laparotomy  The procedure has been discussed with the patient.  Alternative therapies have been discussed with the patient.  Operative risks include bleeding,  Infection,  Organ injury,  Nerve injury,  Blood vessel injury,  DVT,  Pulmonary embolism,  Death,  And possible reoperation.  Medical management risks include worsening of present situation.  The success of the procedure is 50 -90 % at treating patients symptoms.  The patient understands and agrees to proceed.

## 2017-02-01 NOTE — Progress Notes (Signed)
Pt with more hypotension despite fluid therapy and increasing abdominal pain Recommend laparoscopy possible laparotomy  The procedure has been discussed with the patient.  Alternative therapies have been discussed with the patient.  Operative risks include bleeding,  Infection,  Organ injury,  Nerve injury,  Blood vessel injury,  DVT,  Pulmonary embolism,  Death,  And possible reoperation.  Medical management risks include worsening of present situation.  The success of the procedure is 50 -90 % at treating patients symptoms.  The patient understands and agrees to proceed.

## 2017-02-01 NOTE — Progress Notes (Signed)
[facility] Progress Note  3 Days Post-Op  Subjective: CC: abdominal soreness Patient sitting up in chair, daughter present at bedside. Still having some abdominal soreness but improving. Denies n/v this AM. +flatus, last BM 7/5. Tolerating ice and sips of water.  UOP good. VSS.   Objective: Vital signs in last 24 hours: Temp:  [98.2 F (36.8 C)-100.5 F (38.1 C)] 98.3 F (36.8 C) (07/06 0800) Pulse Rate:  [67-98] 67 (07/06 0900) Resp:  [14-32] 17 (07/06 0900) BP: (97-148)/(43-69) 125/69 (07/06 0900) SpO2:  [91 %-100 %] 93 % (07/06 0820) Last BM Date: 02/03/17  Intake/Output from previous day: 07/05 0701 - 07/06 0700 In: 3275 [I.V.:3125; IV Piggyback:150] Out: 1000 [Urine:915; Drains:85] Intake/Output this shift: Total I/O In: 125 [I.V.:125] Out: -   PE: Gen:  Alert, NAD, pleasant Card:  Regular rate and rhythm, pedal pulses 2+ BL Pulm:  Normal effort, expiratory wheezing on the RUL field Abd: Soft, mild TTP of mid to low abdomen, non-distended, bowel sounds present in all 4 quadrants, no HSM, incisions C/D/I Skin: warm and dry, no rashes  Psych: A&Ox3   Lab Results:   Recent Labs  02/03/17 0636 02/03/17 2347  WBC 8.0 7.7  HGB 10.1* 9.8*  HCT 33.1* 32.0*  PLT 280 302   BMET  Recent Labs  02/03/17 0636 02/03/17 2347  NA 140 139  K 3.7 3.9  CL 104 106  CO2 31 28  GLUCOSE 142* 149*  BUN 6 6  CREATININE 0.77 0.72  CALCIUM 8.3* 8.4*   CMP     Component Value Date/Time   NA 139 02/03/2017 2347   K 3.9 02/03/2017 2347   CL 106 02/03/2017 2347   CO2 28 02/03/2017 2347   GLUCOSE 149 (H) 02/03/2017 2347   BUN 6 02/03/2017 2347   CREATININE 0.72 02/03/2017 2347   CALCIUM 8.4 (L) 02/03/2017 2347   PROT 5.5 (L) 02/03/2017 2347   ALBUMIN 2.5 (L) 02/03/2017 2347   AST 13 (L) 02/03/2017 2347   ALT 8 (L) 02/03/2017 2347   ALKPHOS 53 02/03/2017 2347   BILITOT 0.9 02/03/2017 2347   GFRNONAA >60 02/03/2017 2347   GFRAA >60 02/03/2017 2347    Lipase     Component Value Date/Time   LIPASE 36 01/31/2017 1655    Anti-infectives: Anti-infectives    Start     Dose/Rate Route Frequency Ordered Stop   02/01/17 1830  cefoTEtan (CEFOTAN) 2 g in dextrose 5 % 50 mL IVPB  Status:  Discontinued     2 g 100 mL/hr over 30 Minutes Intravenous  Once 02/01/17 1817 02/01/17 1907   02/01/17 0300  piperacillin-tazobactam (ZOSYN) IVPB 3.375 g     3.375 g 12.5 mL/hr over 240 Minutes Intravenous Every 8 hours 01/31/17 2154     01/31/17 2115  piperacillin-tazobactam (ZOSYN) IVPB 3.375 g     3.375 g 100 mL/hr over 30 Minutes Intravenous  Once 01/31/17 2109 01/31/17 2153       Assessment/Plan Contained perforated duodenal ulcer S/p diagnostic laparoscopy with drain placement - POD#3 - drain with 85 cc serosanguinous output over last 24 hrs - d/c foley - start clear liquids - PT/OT, mobilize - transfer to floor  Asthma - wean O2 as tolerated, continue nebs per RT HTN High Cholesterol Chronic Constipation  Obesity Chronic back pain Hx of Lymphoma Stroke - 2012 Hx of SVT  FEN - clear liquids VTE - SCDs, lovenox ID - IV zosyn (7/2>>)  Plan: Transfer to floor. Continue drain. Start  clears. D/c foley  LOS: 4 days    Brigid Re , [facility] Surgery 02/04/2017, 10:39 AM Pager: [phone] Consults: [phone] Mon-Fri 7:00 am-4:30 pm Sat-Sun 7:00 am-11:30 am

## 2017-02-01 NOTE — Progress Notes (Signed)
Subjective/Chief Complaint: abdominal pain Sleepy but comfortable    Objective: Vital signs in last 24 hours: Temp:  [98.4 F (36.9 C)-100.4 F (38 C)] 98.4 F (36.9 C) (07/03 0739) Pulse Rate:  [75-106] 75 (07/03 0739) Resp:  [17-25] 21 (07/03 0739) BP: (102-154)/(32-106) 102/32 (07/03 0739) SpO2:  [83 %-100 %] 94 % (07/03 0739) Weight:  [113.4 kg (250 lb)-118.6 kg (261 lb 7.5 oz)] 118.6 kg (261 lb 7.5 oz) (07/03 0049) Last BM Date: 01/30/17  Intake/Output from previous day: 07/02 0701 - 07/03 0700 In: 2225 [I.V.:1125; IV Piggyback:1100] Out: 550 [Urine:550] Intake/Output this shift: No intake/output data recorded.  Resp: clear to auscultation bilaterally Cardio: regular rate and rhythm, S1, S2 normal, no murmur, click, rub or gallop GI: obese mild epigastric tenderness no diffuse peritonitis  Lab Results:   Recent Labs  01/31/17 1655 02/01/17 0241  WBC 13.2* 14.2*  HGB 13.2 10.9*  HCT 43.1 34.6*  PLT 307 284   BMET  Recent Labs  01/31/17 1655 02/01/17 0241  NA 135 134*  K 3.4* 3.2*  CL 92* 96*  CO2 32 30  GLUCOSE 103* 140*  BUN 12 15  CREATININE 1.14* 1.08*  CALCIUM 9.1 8.5*   PT/INR  Recent Labs  02/01/17 0241  LABPROT 14.6  INR 1.13   ABG No results for input(s): PHART, HCO3 in the last 72 hours.  Invalid input(s): PCO2, PO2  Studies/Results: Ct Abdomen Pelvis W Contrast  Result Date: 01/31/2017 CLINICAL DATA:  76 year old female with acute abdominal and pelvic pain with nausea and vomiting for 4 days. History of lymphoma. EXAM: CT ABDOMEN AND PELVIS WITH CONTRAST TECHNIQUE: Multidetector CT imaging of the abdomen and pelvis was performed using the standard protocol following bolus administration of intravenous contrast. CONTRAST:  100 cc intravenous Isovue-300 COMPARISON:  06/26/2008 PET-CT and prior studies FINDINGS: Lower chest: Mild bibasilar atelectasis/ scarring identified. Hepatobiliary: Cholelithiasis noted without CT evidence of  acute cholecystitis. The liver is unremarkable. Pancreas: Unremarkable Spleen: Unremarkable Adrenals/Urinary Tract: The kidneys, adrenal glands and bladder are unremarkable except for mild bilateral renal atrophy. Stomach/Bowel: There is wall thickening of the distal stomach/proximal duodenum with adjacent inflammation and a few tiny foci of free air, likely related to perforated peptic ulcer. No abscess or bowel obstruction identified. No other bowel wall thickening identified. Vascular/Lymphatic: Aortic atherosclerosis. No enlarged abdominal or pelvic lymph nodes. Reproductive: Status post hysterectomy. No adnexal masses. Other: A tiny amount of free fluid adjacent to the liver noted. Musculoskeletal: No definite acute abnormality. Degenerative and surgical changes in the lumbar spine with multiple compression fractures noted. Vertebral augmentation changes in the lower thoracic spine noted. IMPRESSION: Wall thickening of the distal stomach/ proximal duodenum with adjacent inflammation and small foci of pneumoperitoneum - likely related to perforated peptic ulcer. No bowel obstruction or abscess. Cholelithiasis without CT evidence of acute cholecystitis. Aortic Atherosclerosis (ICD10-I70.0). Critical Value/emergent results were called by telephone at the time of interpretation on 01/31/2017 at 8:51 pm to Dr. Lacretia Leigh , who verbally acknowledged these results. Electronically Signed   By: Margarette Canada M.D.   On: 01/31/2017 20:52   Dg Abd Acute W/chest  Result Date: 01/31/2017 CLINICAL DATA:  History of lymphoma.  Abdominal pain EXAM: DG ABDOMEN ACUTE W/ 1V CHEST COMPARISON:  PET-CT scan 06/26/2008 FINDINGS: Posterior thoracic and lumbar spinal fusion. Low lung volumes. Fine linear interstitial pattern present. No dilated loops of large or small bowel. No significant stool within the colon. Gallstones noted within the gallbladder not changed from prior.  IMPRESSION: 1. Low lung volumes and mild interstitial  edema. 2. No bowel obstruction.  No evidence of constipation. 3. Cholelithiasis. Electronically Signed   By: Suzy Bouchard M.D.   On: 01/31/2017 18:15   Dg Duanne Limerick W/water Sol Cm  Result Date: 02/01/2017 CLINICAL DATA:  Peptic ulcer with perforation EXAM: WATER SOLUBLE UPPER GI SERIES TECHNIQUE: Single-column upper GI series was performed using water soluble contrast. CONTRAST:  125 cc Isovue 300 administered orally COMPARISON:  01/31/2017 FLUOROSCOPY TIME:  Fluoroscopy Time:  1 minutes, 48 seconds Radiation Exposure Index (if provided by the fluoroscopic device): 48.3 mGy Number of Acquired Spot Images: Three FINDINGS: Initially I reviewed the patient's CT scan from 01/31/2017. This revealed an apparent anterior ulceration along the gastric body along with local inflammatory stranding and a small amount of localized and loculated extraluminal gas. Initial KUB demonstrates multiple thoracic and lumbar compression fractures with vertebral augmentations, and posterolateral rod and pedicle screw fixation at numerous levels extending from the thoracic spine down to L3. Gallstones are also visible on the KUB, as is contrast medium in the colon left over from yesterday's CT scan. There is also contrast medium in the urinary bladder. Upon assessing the patient, it was clear that she is not in a condition to easily turn on the fluoroscopy table. The patient is debilitated and had great difficulty turning partially due to body habitus and debilitation. I elected to start the exam in the RAO position as this would allow contrast to proceed to the region of the ulcer, giving [place] a chance to see the ulceration and assess for leak. It took 4 caregivers about 5 minutes to turn the patient from the prone position to the RAO position on the table. Because of the patient's limitations, I was not able to turn her body in the same manner as on a normal upper GI examination, where we examime the stomach from different angles and  during dynamic turning. Today' s exam must be understood to have limitations due to the patient's limitations an positioning ability. Also, because of the potential perforation we used water-soluble contrast, which does not coat the stomach in the same manner as standard barium preparations. The initial RAO images demonstrate filling of the stomach body extending to the antrum. I was able to turn the patient slightly during imaging and examined the anterior stomach margin for irregularity ; the patient's known ulcer is not well seen. There is no leak of contrast during today's exam. Because of the patient' s debilitation and because of the focus on the gastric ulcer, we did not examine the esophagus. On final images, there is a small amount of contrast in the duodenal bulb, but we did not comprehensively filled the duodenum. IMPRESSION: 1. No visible leak from the stomach during today's exam. The original ulceration is not well seen. 2. Reduced sensitivity and specificity due to patient debilitation and mobility issues ; the patient had very limited ability to turn on the fluoroscopy table. I was able to fill the lumen of the stomach in the expected region of the ulcer with oral contrast medium by starting in the RAO position. Electronically Signed   By: Van Clines M.D.   On: 02/01/2017 09:24    Anti-infectives: Anti-infectives    Start     Dose/Rate Route Frequency Ordered Stop   02/01/17 0300  piperacillin-tazobactam (ZOSYN) IVPB 3.375 g     3.375 g 12.5 mL/hr over 240 Minutes Intravenous Every 8 hours 01/31/17 2154  01/31/17 2115  piperacillin-tazobactam (ZOSYN) IVPB 3.375 g     3.375 g 100 mL/hr over 30 Minutes Intravenous  Once 01/31/17 2109 01/31/17 2153      Assessment/Plan: Patient Active Problem List   Diagnosis Date Noted  . Peptic ulcer with perforation ([place]) 01/31/2017  . Stroke ([place]) 07/20/2011    Class: Acute  . Nausea and vomiting 07/18/2011  . Lateral nystagmus  07/18/2011    Class: Acute  . Lymphoma in remission 07/18/2011    Class: History of  . Hypertension 07/18/2011    Class: History of  . Obesity 07/18/2011    Class: Chronic  . SVT (supraventricular tachycardia) ([place]) 07/18/2011    Class: History of  . Depression 07/18/2011    Class: History of  . History of hypokalemia 07/18/2011    Class: History of  . Back pain, chronic 07/18/2011    Class: Chronic  . 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    Class: History of  . DISORDER OF BONE AND CARTILAGE UNSPECIFIED 04/29/2008    UGI reviewed  No leak Keep NPO for today Protonix  Can be OOB  DVT prevention    LOS: 1 day    Terrace Fontanilla A. 02/01/2017

## 2017-02-01 NOTE — Transfer of Care (Signed)
Immediate Anesthesia Transfer of Care Note  Patient: [name]  Procedure(s) Performed: Procedure(s): LAPAROSCOPY DIAGNOSTIC (N/A)  Patient Location: PACU  Anesthesia Type:General  Level of Consciousness: awake and alert   Airway & Oxygen Therapy: Patient Spontanous Breathing and Patient connected to face mask oxygen  Post-op Assessment: Report given to RN, Post -op Vital signs reviewed and stable and Patient moving all extremities X 4  Post vital signs: Reviewed and stable  Last Vitals:  Vitals:   02/01/17 1200 02/01/17 1237  BP: (!) 87/33 (!) 80/35  Pulse: 66 69  Resp: 17 17  Temp:      Last Pain:  Vitals:   02/01/17 1100  TempSrc: Oral  PainSc:       Patients Stated Pain Goal: 8 (07/25/81 5003)  Complications: No apparent anesthesia complications

## 2017-02-01 NOTE — Anesthesia Procedure Notes (Signed)
Procedure Name: Intubation Date/Time: 02/01/2017 3:09 PM Performed by: Garrison Columbus T Pre-anesthesia Checklist: Patient identified, Emergency Drugs available, Suction available and Patient being monitored Patient Re-evaluated:Patient Re-evaluated prior to inductionOxygen Delivery Method: Circle system utilized Preoxygenation: Pre-oxygenation with 100% oxygen Intubation Type: IV induction, Cricoid Pressure applied and Rapid sequence Laryngoscope Size: Miller and 2 Grade View: Grade II Tube type: Oral Tube size: 7.5 mm Number of attempts: 1 Airway Equipment and Method: Stylet Placement Confirmation: ETT inserted through vocal cords under direct vision and positive ETCO2 Secured at: 22 cm Tube secured with: Tape Dental Injury: Teeth and Oropharynx as per pre-operative assessment

## 2017-02-01 NOTE — Op Note (Signed)
Preoperative diagnosis: Contained perforated duodenal ulcer  Postoperative diagnosis: Same  Procedure: Diagnostic laparoscopy  Surgeon: Erroll Luna M.D.  Anesthesia: Gen. with 0.25% Sensorcaine local with epinephrine  EBL: Minimal  Drains: 19 round drain to Morison's pouch  Specimen: None  Indications for procedure: The patient's a 76 year old female admitted last time by Dr. Hulen Skains for a contained perforated duodenal ulcer. She has had issues with hypotension this afternoon despite being managed nonoperatively. I recommended a laparoscopic approach to this to verify contamination was not extensive leading to possible sepsis syndrome. I talked about laparotomy with repair if necessary. She had an upper GI which was normal which she continues to have issues with hypotension requiring volume. I discussed this with the patient and daughter they agree to proceed.The procedure has been discussed with the patient.  Alternative therapies have been discussed with the patient.  Operative risks include bleeding,  Infection,  Organ injury,  Nerve injury,  Blood vessel injury,  DVT,  Pulmonary embolism,  Death,  And possible reoperation.  Medical management risks include worsening of present situation.  The success of the procedure is 50 -90 % at treating patients symptoms.  The patient understands and agrees to proceed.  Description of procedure: The patient was met in the holding area and questions are answered. She was taken back to the operating room placed supine upon the OR table. General anesthesia was induced. She was hemodynamically stable. Timeout was done. She received 2 g of Ancef. A 5 mm Optiview port was placed in the left upper quadrant. This was done direct vision without injury to internal viscera. I was able to create a pneumoperitoneum to 15 no measure mercury of CO2 pressure. Laparoscope was placed. There is no evidence of contamination in the abdominal cavity. There is mild localized  peritonitis over what appeared to be the omentum which was stuck to the first portion of the duodenum. There is no bile or evidence of gastric contents and the entire abdominal cavity. The omentum was stuck on the ulcer and this was not removed. I used gentle irrigation. Now cavity. This was suctioned out and  was clear. 4 quadrant laparoscopy revealed no other abnormality. Operative place a drain in the Morison's pouch adjacent to this. This was done to the more medial port site. 3 port sites were used which were  all 5 mm. This was secured to the skin with 2-0 nylon. Ports are then removed and passed off the field allowing the CO2 to escape. All counts are correct. Dermabond applied. The patient was awoke extubated and  taken to recovery in satisfactory condition.

## 2017-02-02 ENCOUNTER — Encounter (HOSPITAL_COMMUNITY): Payer: Self-pay | Admitting: Surgery

## 2017-02-02 MED ORDER — ONDANSETRON HCL 4 MG/2ML IJ SOLN
INTRAMUSCULAR | Status: AC
  Filled 2017-02-02: qty 2

## 2017-02-02 MED ORDER — ONDANSETRON HCL 4 MG/2ML IJ SOLN
4.0000 mg | INTRAMUSCULAR | Status: DC | PRN
  Administered 2017-02-02 – 2017-02-03 (×3): 4 mg via INTRAVENOUS
  Filled 2017-02-02 (×2): qty 2

## 2017-02-02 NOTE — Progress Notes (Signed)
1 Day Post-Op   Subjective/Chief Complaint: Pt doing well today Some abd soreness   Objective: Vital signs in last 24 hours: Temp:  [97.2 F (36.2 C)-99.1 F (37.3 C)] 99.1 F (37.3 C) (07/04 0400) Pulse Rate:  [66-120] 120 (07/04 0500) Resp:  [14-25] 21 (07/04 0500) BP: (80-140)/(33-60) 137/55 (07/04 0500) SpO2:  [91 %-100 %] 94 % (07/04 0735) Arterial Line BP: (61-159)/(41-58) 61/58 (07/04 0500) Weight:  [124.3 kg (274 lb 0.5 oz)] 124.3 kg (274 lb 0.5 oz) (07/03 1800) Last BM Date: 01/30/17  Intake/Output from previous day: 07/03 0701 - 07/04 0700 In: 3600 [I.V.:3450; IV Piggyback:150] Out: 5956 [LOVFI:4332; Emesis/NG output:60; Drains:60; Blood:10] Intake/Output this shift: No intake/output data recorded.  General appearance: alert and cooperative GI: soft, approp ttp, ND, JP SS, incisions c/d/i  Lab Results:   Recent Labs  01/31/17 1655 02/01/17 0241  WBC 13.2* 14.2*  HGB 13.2 10.9*  HCT 43.1 34.6*  PLT 307 284   BMET  Recent Labs  01/31/17 1655 02/01/17 0241  NA 135 134*  K 3.4* 3.2*  CL 92* 96*  CO2 32 30  GLUCOSE 103* 140*  BUN 12 15  CREATININE 1.14* 1.08*  CALCIUM 9.1 8.5*   PT/INR  Recent Labs  02/01/17 0241  LABPROT 14.6  INR 1.13   ABG No results for input(s): PHART, HCO3 in the last 72 hours.  Invalid input(s): PCO2, PO2  Studies/Results: Ct Abdomen Pelvis W Contrast  Result Date: 01/31/2017 CLINICAL DATA:  76 year old female with acute abdominal and pelvic pain with nausea and vomiting for 4 days. History of lymphoma. EXAM: CT ABDOMEN AND PELVIS WITH CONTRAST TECHNIQUE: Multidetector CT imaging of the abdomen and pelvis was performed using the standard protocol following bolus administration of intravenous contrast. CONTRAST:  100 cc intravenous Isovue-300 COMPARISON:  06/26/2008 PET-CT and prior studies FINDINGS: Lower chest: Mild bibasilar atelectasis/ scarring identified. Hepatobiliary: Cholelithiasis noted without CT evidence  of acute cholecystitis. The liver is unremarkable. Pancreas: Unremarkable Spleen: Unremarkable Adrenals/Urinary Tract: The kidneys, adrenal glands and bladder are unremarkable except for mild bilateral renal atrophy. Stomach/Bowel: There is wall thickening of the distal stomach/proximal duodenum with adjacent inflammation and a few tiny foci of free air, likely related to perforated peptic ulcer. No abscess or bowel obstruction identified. No other bowel wall thickening identified. Vascular/Lymphatic: Aortic atherosclerosis. No enlarged abdominal or pelvic lymph nodes. Reproductive: Status post hysterectomy. No adnexal masses. Other: A tiny amount of free fluid adjacent to the liver noted. Musculoskeletal: No definite acute abnormality. Degenerative and surgical changes in the lumbar spine with multiple compression fractures noted. Vertebral augmentation changes in the lower thoracic spine noted. IMPRESSION: Wall thickening of the distal stomach/ proximal duodenum with adjacent inflammation and small foci of pneumoperitoneum - likely related to perforated peptic ulcer. No bowel obstruction or abscess. Cholelithiasis without CT evidence of acute cholecystitis. Aortic Atherosclerosis (ICD10-I70.0). Critical Value/emergent results were called by telephone at the time of interpretation on 01/31/2017 at 8:51 pm to Dr. Lacretia Leigh , who verbally acknowledged these results. Electronically Signed   By: Margarette Canada M.D.   On: 01/31/2017 20:52   Dg Chest Port 1 View  Result Date: 02/01/2017 CLINICAL DATA:  Central line placement EXAM: PORTABLE CHEST 1 VIEW COMPARISON:  01/31/2017 FINDINGS: Right internal jugular central line has been placed with the tip in the upper right atrium. No pneumothorax. Bibasilar atelectasis or infiltrates, increased in the right base since prior study, stable on the left. Heart is normal size. NG tube is in the  stomach. IMPRESSION: Right central line tip in the upper right atrium. No  pneumothorax. NG tube in the stomach. Bibasilar atelectasis, worsening on the right. Electronically Signed   By: Rolm Baptise M.D.   On: 02/01/2017 17:06   Dg Abd Acute W/chest  Result Date: 01/31/2017 CLINICAL DATA:  History of lymphoma.  Abdominal pain EXAM: DG ABDOMEN ACUTE W/ 1V CHEST COMPARISON:  PET-CT scan 06/26/2008 FINDINGS: Posterior thoracic and lumbar spinal fusion. Low lung volumes. Fine linear interstitial pattern present. No dilated loops of large or small bowel. No significant stool within the colon. Gallstones noted within the gallbladder not changed from prior. IMPRESSION: 1. Low lung volumes and mild interstitial edema. 2. No bowel obstruction.  No evidence of constipation. 3. Cholelithiasis. Electronically Signed   By: Suzy Bouchard M.D.   On: 01/31/2017 18:15   Dg Duanne Limerick W/water Sol Cm  Result Date: 02/01/2017 CLINICAL DATA:  Peptic ulcer with perforation EXAM: WATER SOLUBLE UPPER GI SERIES TECHNIQUE: Single-column upper GI series was performed using water soluble contrast. CONTRAST:  125 cc Isovue 300 administered orally COMPARISON:  01/31/2017 FLUOROSCOPY TIME:  Fluoroscopy Time:  1 minutes, 48 seconds Radiation Exposure Index (if provided by the fluoroscopic device): 48.3 mGy Number of Acquired Spot Images: Three FINDINGS: Initially I reviewed the patient's CT scan from 01/31/2017. This revealed an apparent anterior ulceration along the gastric body along with local inflammatory stranding and a small amount of localized and loculated extraluminal gas. Initial KUB demonstrates multiple thoracic and lumbar compression fractures with vertebral augmentations, and posterolateral rod and pedicle screw fixation at numerous levels extending from the thoracic spine down to L3. Gallstones are also visible on the KUB, as is contrast medium in the colon left over from yesterday's CT scan. There is also contrast medium in the urinary bladder. Upon assessing the patient, it was clear that she is not  in a condition to easily turn on the fluoroscopy table. The patient is debilitated and had great difficulty turning partially due to body habitus and debilitation. I elected to start the exam in the RAO position as this would allow contrast to proceed to the region of the ulcer, giving [place] a chance to see the ulceration and assess for leak. It took 4 caregivers about 5 minutes to turn the patient from the prone position to the RAO position on the table. Because of the patient's limitations, I was not able to turn her body in the same manner as on a normal upper GI examination, where we examime the stomach from different angles and during dynamic turning. Today' s exam must be understood to have limitations due to the patient's limitations an positioning ability. Also, because of the potential perforation we used water-soluble contrast, which does not coat the stomach in the same manner as standard barium preparations. The initial RAO images demonstrate filling of the stomach body extending to the antrum. I was able to turn the patient slightly during imaging and examined the anterior stomach margin for irregularity ; the patient's known ulcer is not well seen. There is no leak of contrast during today's exam. Because of the patient' s debilitation and because of the focus on the gastric ulcer, we did not examine the esophagus. On final images, there is a small amount of contrast in the duodenal bulb, but we did not comprehensively filled the duodenum. IMPRESSION: 1. No visible leak from the stomach during today's exam. The original ulceration is not well seen. 2. Reduced sensitivity and specificity due to patient  debilitation and mobility issues ; the patient had very limited ability to turn on the fluoroscopy table. I was able to fill the lumen of the stomach in the expected region of the ulcer with oral contrast medium by starting in the RAO position. Electronically Signed   By: Van Clines M.D.   On:  02/01/2017 09:24    Anti-infectives: Anti-infectives    Start     Dose/Rate Route Frequency Ordered Stop   02/01/17 1830  cefoTEtan (CEFOTAN) 2 g in dextrose 5 % 50 mL IVPB  Status:  Discontinued     2 g 100 mL/hr over 30 Minutes Intravenous  Once 02/01/17 1817 02/01/17 1907   02/01/17 0300  piperacillin-tazobactam (ZOSYN) IVPB 3.375 g     3.375 g 12.5 mL/hr over 240 Minutes Intravenous Every 8 hours 01/31/17 2154     01/31/17 2115  piperacillin-tazobactam (ZOSYN) IVPB 3.375 g     3.375 g 100 mL/hr over 30 Minutes Intravenous  Once 01/31/17 2109 01/31/17 2153      Assessment/Plan: POD #1 Dx Lap & drain placement for auto sealed perforated DU  -Con't NGT and NPO -mobilize as tol -Con't IVF -IS Plan trx out of ICU tomorrow if pt con't to be stable   LOS: 2 days    Rosario Jacks., Anne Hahn 02/02/2017

## 2017-02-03 LAB — CBC
HCT: 33.1 % — ABNORMAL LOW (ref 36.0–46.0)
Hemoglobin: 10.1 g/dL — ABNORMAL LOW (ref 12.0–15.0)
MCH: 29 pg (ref 26.0–34.0)
MCHC: 30.5 g/dL (ref 30.0–36.0)
MCV: 95.1 fL (ref 78.0–100.0)
PLATELETS: 280 10*3/uL (ref 150–400)
RBC: 3.48 MIL/uL — ABNORMAL LOW (ref 3.87–5.11)
RDW: 13.6 % (ref 11.5–15.5)
WBC: 8 10*3/uL (ref 4.0–10.5)

## 2017-02-03 LAB — BASIC METABOLIC PANEL
ANION GAP: 5 (ref 5–15)
BUN: 6 mg/dL (ref 6–20)
CALCIUM: 8.3 mg/dL — AB (ref 8.9–10.3)
CO2: 31 mmol/L (ref 22–32)
CREATININE: 0.77 mg/dL (ref 0.44–1.00)
Chloride: 104 mmol/L (ref 101–111)
GFR calc Af Amer: >60 mL/min (ref >60)
Glucose, Bld: 142 mg/dL — ABNORMAL HIGH (ref 65–99)
Potassium: 3.7 mmol/L (ref 3.5–5.1)
Sodium: 140 mmol/L (ref 135–145)

## 2017-02-03 LAB — POCT I-STAT 7, (LYTES, BLD GAS, ICA,H+H)
BICARBONATE: 26.4 mmol/L (ref 20.0–28.0)
Calcium, Ion: 1.15 mmol/L (ref 1.15–1.40)
HCT: 28 % — ABNORMAL LOW (ref 36.0–46.0)
HEMOGLOBIN: 9.5 g/dL — AB (ref 12.0–15.0)
O2 SAT: 98 %
PO2 ART: 106 mmHg (ref 83.0–108.0)
Patient temperature: 36.5
Potassium: 3.6 mmol/L (ref 3.5–5.1)
SODIUM: 140 mmol/L (ref 135–145)
TCO2: 28 mmol/L (ref 0–100)
pCO2 arterial: 49.4 mmHg — ABNORMAL HIGH (ref 32.0–48.0)
pH, Arterial: 7.332 — ABNORMAL LOW (ref 7.350–7.450)

## 2017-02-03 MED ORDER — ALBUMIN HUMAN 5 % IV SOLN
25.0000 g | Freq: Once | INTRAVENOUS | Status: AC
  Administered 2017-02-03: 25 g via INTRAVENOUS

## 2017-02-03 MED ORDER — ALBUMIN HUMAN 5 % IV SOLN
INTRAVENOUS | Status: AC
  Filled 2017-02-03: qty 500

## 2017-02-03 NOTE — Progress Notes (Signed)
2 Days Post-Op   Subjective/Chief Complaint: abdominal pain Sore   BP soft     Objective: Vital signs in last 24 hours: Temp:  [98.5 F (36.9 C)-99.8 F (37.7 C)] 99.2 F (37.3 C) (07/05 0319) Pulse Rate:  [73-107] 78 (07/05 0800) Resp:  [14-24] 16 (07/05 0800) BP: (96-145)/(36-82) 121/55 (07/05 0800) SpO2:  [84 %-100 %] 96 % (07/05 0800) Arterial Line BP: (60-155)/(49-87) 66/62 (07/05 0600) Last BM Date: 01/30/17  Intake/Output from previous day: 07/04 0701 - 07/05 0700 In: 3075 [I.V.:2375; IV Piggyback:700] Out: 1490 [Urine:1250; Emesis/NG output:140; Drains:100] Intake/Output this shift: Total I/O In: 250 [I.V.:250] Out: -   Resp: clear to auscultation bilaterally Cardio: regular rate and rhythm, S1, S2 normal, no murmur, click, rub or gallop Incision/Wound:CDI JP  serous   Lab Results:   Recent Labs  02/01/17 0241 02/01/17 1529 02/03/17 0636  WBC 14.2*  --  8.0  HGB 10.9* 9.5* 10.1*  HCT 34.6* 28.0* 33.1*  PLT 284  --  280   BMET  Recent Labs  02/01/17 0241 02/01/17 1529 02/03/17 0636  NA 134* 140 140  K 3.2* 3.6 3.7  CL 96*  --  104  CO2 30  --  31  GLUCOSE 140*  --  142*  BUN 15  --  6  CREATININE 1.08*  --  0.77  CALCIUM 8.5*  --  8.3*   PT/INR  Recent Labs  02/01/17 0241  LABPROT 14.6  INR 1.13   ABG  Recent Labs  02/01/17 1529  PHART 7.332*  HCO3 26.4    Studies/Results: Dg Chest Port 1 View  Result Date: 02/01/2017 CLINICAL DATA:  Central line placement EXAM: PORTABLE CHEST 1 VIEW COMPARISON:  01/31/2017 FINDINGS: Right internal jugular central line has been placed with the tip in the upper right atrium. No pneumothorax. Bibasilar atelectasis or infiltrates, increased in the right base since prior study, stable on the left. Heart is normal size. NG tube is in the stomach. IMPRESSION: Right central line tip in the upper right atrium. No pneumothorax. NG tube in the stomach. Bibasilar atelectasis, worsening on the right.  Electronically Signed   By: Rolm Baptise M.D.   On: 02/01/2017 17:06    Anti-infectives: Anti-infectives    Start     Dose/Rate Route Frequency Ordered Stop   02/01/17 1830  cefoTEtan (CEFOTAN) 2 g in dextrose 5 % 50 mL IVPB  Status:  Discontinued     2 g 100 mL/hr over 30 Minutes Intravenous  Once 02/01/17 1817 02/01/17 1907   02/01/17 0300  piperacillin-tazobactam (ZOSYN) IVPB 3.375 g     3.375 g 12.5 mL/hr over 240 Minutes Intravenous Every 8 hours 01/31/17 2154     01/31/17 2115  piperacillin-tazobactam (ZOSYN) IVPB 3.375 g     3.375 g 100 mL/hr over 30 Minutes Intravenous  Once 01/31/17 2109 01/31/17 2153      Assessment/Plan: s/p Procedure(s): LAPAROSCOPY DIAGNOSTIC (N/A) D/C central line D/C NGT Ice chips   Keep foley for strict I/O OOB   LOS: 3 days    [name] A. 02/03/2017

## 2017-02-03 NOTE — Progress Notes (Signed)
Patient placed on CPAP for the evening without complication. RT will continue to monitor as needed. 

## 2017-02-04 ENCOUNTER — Encounter (HOSPITAL_COMMUNITY): Payer: Self-pay | Admitting: Surgery

## 2017-02-04 LAB — COMPREHENSIVE METABOLIC PANEL
ALBUMIN: 2.5 g/dL — AB (ref 3.5–5.0)
ALT: 8 U/L — ABNORMAL LOW (ref 14–54)
ANION GAP: 5 (ref 5–15)
AST: 13 U/L — ABNORMAL LOW (ref 15–41)
Alkaline Phosphatase: 53 U/L (ref 38–126)
BILIRUBIN TOTAL: 0.9 mg/dL (ref 0.3–1.2)
BUN: 6 mg/dL (ref 6–20)
CO2: 28 mmol/L (ref 22–32)
Calcium: 8.4 mg/dL — ABNORMAL LOW (ref 8.9–10.3)
Chloride: 106 mmol/L (ref 101–111)
Creatinine, Ser: 0.72 mg/dL (ref 0.44–1.00)
GFR calc non Af Amer: >60 mL/min (ref >60)
GLUCOSE: 149 mg/dL — AB (ref 65–99)
POTASSIUM: 3.9 mmol/L (ref 3.5–5.1)
SODIUM: 139 mmol/L (ref 135–145)
TOTAL PROTEIN: 5.5 g/dL — AB (ref 6.5–8.1)

## 2017-02-04 LAB — CBC
HCT: 32 % — ABNORMAL LOW (ref 36.0–46.0)
Hemoglobin: 9.8 g/dL — ABNORMAL LOW (ref 12.0–15.0)
MCH: 29.3 pg (ref 26.0–34.0)
MCHC: 30.6 g/dL (ref 30.0–36.0)
MCV: 95.8 fL (ref 78.0–100.0)
PLATELETS: 302 10*3/uL (ref 150–400)
RBC: 3.34 MIL/uL — AB (ref 3.87–5.11)
RDW: 13.9 % (ref 11.5–15.5)
WBC: 7.7 10*3/uL (ref 4.0–10.5)

## 2017-02-04 MED ORDER — LEVALBUTEROL HCL 0.63 MG/3ML IN NEBU
0.6300 mg | INHALATION_SOLUTION | RESPIRATORY_TRACT | Status: DC | PRN
  Administered 2017-02-04 – 2017-02-07 (×3): 0.63 mg via RESPIRATORY_TRACT
  Filled 2017-02-04 (×2): qty 3

## 2017-02-04 MED ORDER — LEVALBUTEROL HCL 0.63 MG/3ML IN NEBU
INHALATION_SOLUTION | RESPIRATORY_TRACT | Status: AC
Start: 2017-02-04 — End: 2017-02-04
  Administered 2017-02-04: 0.63 mg via RESPIRATORY_TRACT
  Filled 2017-02-04: qty 3

## 2017-02-04 MED ORDER — LEVALBUTEROL HCL 0.63 MG/3ML IN NEBU
0.6300 mg | INHALATION_SOLUTION | RESPIRATORY_TRACT | Status: DC
  Administered 2017-02-04: 0.63 mg via RESPIRATORY_TRACT
  Filled 2017-02-04 (×2): qty 3

## 2017-02-04 MED ORDER — IPRATROPIUM BROMIDE 0.02 % IN SOLN: 0.5000 mg | RESPIRATORY_TRACT | Status: DC | PRN

## 2017-02-04 MED ORDER — LEVALBUTEROL HCL 0.63 MG/3ML IN NEBU
0.6300 mg | INHALATION_SOLUTION | Freq: Four times a day (QID) | RESPIRATORY_TRACT | Status: DC
  Administered 2017-02-05 (×2): 0.63 mg via RESPIRATORY_TRACT
  Filled 2017-02-04 (×2): qty 3

## 2017-02-04 MED ORDER — IPRATROPIUM BROMIDE 0.02 % IN SOLN
RESPIRATORY_TRACT | Status: AC
  Administered 2017-02-04: 0.5 mg via RESPIRATORY_TRACT
  Filled 2017-02-04: qty 2.5

## 2017-02-04 MED ORDER — IPRATROPIUM BROMIDE 0.02 % IN SOLN
0.5000 mg | RESPIRATORY_TRACT | Status: DC
  Administered 2017-02-04 (×2): 0.5 mg via RESPIRATORY_TRACT
  Filled 2017-02-04 (×2): qty 2.5

## 2017-02-04 MED ORDER — BUDESONIDE 0.25 MG/2ML IN SUSP
0.2500 mg | Freq: Two times a day (BID) | RESPIRATORY_TRACT | Status: DC
  Administered 2017-02-04 – 2017-02-10 (×12): 0.25 mg via RESPIRATORY_TRACT
  Filled 2017-02-04 (×12): qty 2

## 2017-02-04 MED ORDER — IPRATROPIUM BROMIDE 0.02 % IN SOLN
0.5000 mg | Freq: Four times a day (QID) | RESPIRATORY_TRACT | Status: DC
  Administered 2017-02-05 (×2): 0.5 mg via RESPIRATORY_TRACT
  Filled 2017-02-04 (×2): qty 2.5

## 2017-02-04 NOTE — Progress Notes (Signed)
Patient has home CPAP and daughter helps place it on. RT will continue to monitor as needed.

## 2017-02-04 NOTE — Anesthesia Postprocedure Evaluation (Signed)
Anesthesia Post Note  Patient: [name]  Procedure(s) Performed: Procedure(s) (LRB): LAPAROSCOPY DIAGNOSTIC (N/A)     Patient location during evaluation: PACU Anesthesia Type: General Level of consciousness: awake and patient cooperative Pain management: pain level controlled Vital Signs Assessment: post-procedure vital signs reviewed and stable Respiratory status: spontaneous breathing, nonlabored ventilation, respiratory function stable and patient connected to nasal cannula oxygen Cardiovascular status: stable Postop Assessment: no signs of nausea or vomiting Anesthetic complications: no    Last Vitals:  Vitals:   02/04/17 0500 02/04/17 0600  BP: (!) 97/49 (!) 129/57  Pulse: 73 82  Resp: 17 (!) 28  Temp:      Last Pain:  Vitals:   02/04/17 0400  TempSrc: Oral  PainSc:                  Rick Carruthers

## 2017-02-04 NOTE — Care Management Note (Signed)
Case Management Note  Patient Details  Name: [name] MRN: [id number] Date of Birth: 1940/09/28  Subjective/Objective:     Pt admitted on 01/31/17 with abdominal pain, perforated duodenal ulcer.  PTA, pt independent, lives at home with husband.                 Action/Plan: Pt s/p laparoscopy with repair on 02/01/17.  PT/OT consults pending; will follow for discharge planning as pt progresses.    Expected Discharge Date:  02/04/17               Expected Discharge Plan:  [place]  In-House Referral:     Discharge planning Services  CM Consult  Post Acute Care Choice:    Choice offered to:     DME Arranged:    DME Agency:     HH Arranged:    HH Agency:     Status of Service:  In process, will continue to follow  If discussed at Long Length of Stay Meetings, dates discussed:    Additional Comments:  Reinaldo Raddle, RN, BSN  Trauma/Neuro ICU Case Manager [phone]

## 2017-02-04 NOTE — Progress Notes (Signed)
Received from 4N. Alert and oriented, patient noted to be having labored breathing after using the bathroom, O2 satrted at 3LPM, nebs given per order. VSS at this time. Oriented to unit and staff. Will continue to monitor.

## 2017-02-04 NOTE — Plan of Care (Signed)
Problem: Nutrition: Goal: Adequate nutrition will be maintained Outcome: Completed/Met Date Met: 02/04/17 Clear liquid diet today.  Advance as tolerated

## 2017-02-05 LAB — CULTURE, BLOOD (ROUTINE X 2)
Culture: NO GROWTH
Culture: NO GROWTH
SPECIAL REQUESTS: ADEQUATE
Special Requests: ADEQUATE

## 2017-02-05 LAB — TYPE AND SCREEN
ABO/RH(D): A POS
ANTIBODY SCREEN: NEGATIVE
UNIT DIVISION: 0
UNIT DIVISION: 0

## 2017-02-05 LAB — BPAM RBC
BLOOD PRODUCT EXPIRATION DATE: 201807172359
BLOOD PRODUCT EXPIRATION DATE: 201807192359
UNIT TYPE AND RH: 6200
Unit Type and Rh: 6200

## 2017-02-05 MED ORDER — IPRATROPIUM BROMIDE 0.02 % IN SOLN
0.5000 mg | Freq: Three times a day (TID) | RESPIRATORY_TRACT | Status: DC
  Administered 2017-02-05 – 2017-02-10 (×14): 0.5 mg via RESPIRATORY_TRACT
  Filled 2017-02-05 (×14): qty 2.5

## 2017-02-05 MED ORDER — LEVALBUTEROL HCL 0.63 MG/3ML IN NEBU
0.6300 mg | INHALATION_SOLUTION | Freq: Three times a day (TID) | RESPIRATORY_TRACT | Status: DC
  Administered 2017-02-05 – 2017-02-10 (×13): 0.63 mg via RESPIRATORY_TRACT
  Filled 2017-02-05 (×14): qty 3

## 2017-02-05 NOTE — Progress Notes (Signed)
Assessment Contained perforation of DU s/p laparoscopy and drain placement 02/01/17 (Dr. Geryl Councilman function starting to return; some dyspnea  Plan:  Decrease IVF.  Incentive Spirometry. Full liquid diet. Continue inhalers. Mobilize.   LOS: 5 days     4 Days Post-Op  Chief Complaint/Subjective: Short of breath at times.  Gets this at home as well.  Passing some gas.  Tolerating clear liquids.  Objective: Vital signs in last 24 hours: Temp:  [98.1 F (36.7 C)-99.6 F (37.6 C)] 98.2 F (36.8 C) (07/07 0624) Pulse Rate:  [67-92] 84 (07/07 0624) Resp:  [17-28] 18 (07/07 0624) BP: (125-159)/(54-72) 146/56 (07/07 0624) SpO2:  [92 %-98 %] 92 % (07/07 0624) Last BM Date: 02/03/17  Intake/Output from previous day: 07/06 0701 - 07/07 0700 In: 1922.5 [I.V.:1922.5] Out: 1075 [Urine:1025; Drains:50] Intake/Output this shift: No intake/output data recorded.  PE: General- In NAD.  Awake and alert. Lungs-decreased breath sounds in bases Abdomen-soft, wounds clean, thin serosanguinous drain output  Lab Results:   Recent Labs  02/03/17 0636 02/03/17 2347  WBC 8.0 7.7  HGB 10.1* 9.8*  HCT 33.1* 32.0*  PLT 280 302   BMET  Recent Labs  02/03/17 0636 02/03/17 2347  NA 140 139  K 3.7 3.9  CL 104 106  CO2 31 28  GLUCOSE 142* 149*  BUN 6 6  CREATININE 0.77 0.72  CALCIUM 8.3* 8.4*   PT/INR No results for input(s): LABPROT, INR in the last 72 hours. Comprehensive Metabolic Panel:    Component Value Date/Time   NA 139 02/03/2017 2347   NA 140 02/03/2017 0636   K 3.9 02/03/2017 2347   K 3.7 02/03/2017 0636   CL 106 02/03/2017 2347   CL 104 02/03/2017 0636   CO2 28 02/03/2017 2347   CO2 31 02/03/2017 0636   BUN 6 02/03/2017 2347   BUN 6 02/03/2017 0636   CREATININE 0.72 02/03/2017 2347   CREATININE 0.77 02/03/2017 0636   GLUCOSE 149 (H) 02/03/2017 2347   GLUCOSE 142 (H) 02/03/2017 0636   CALCIUM 8.4 (L) 02/03/2017 2347   CALCIUM 8.3 (L) 02/03/2017 0636   AST 13  (L) 02/03/2017 2347   AST 24 01/31/2017 1655   ALT 8 (L) 02/03/2017 2347   ALT 17 01/31/2017 1655   ALKPHOS 53 02/03/2017 2347   ALKPHOS 87 01/31/2017 1655   BILITOT 0.9 02/03/2017 2347   BILITOT 1.4 (H) 01/31/2017 1655   PROT 5.5 (L) 02/03/2017 2347   PROT 7.4 01/31/2017 1655   ALBUMIN 2.5 (L) 02/03/2017 2347   ALBUMIN 3.4 (L) 01/31/2017 1655     Studies/Results: No results found.  Anti-infectives: Anti-infectives    Start     Dose/Rate Route Frequency Ordered Stop   02/01/17 1830  cefoTEtan (CEFOTAN) 2 g in dextrose 5 % 50 mL IVPB  Status:  Discontinued     2 g 100 mL/hr over 30 Minutes Intravenous  Once 02/01/17 1817 02/01/17 1907   02/01/17 0300  piperacillin-tazobactam (ZOSYN) IVPB 3.375 g     3.375 g 12.5 mL/hr over 240 Minutes Intravenous Every 8 hours 01/31/17 2154     01/31/17 2115  piperacillin-tazobactam (ZOSYN) IVPB 3.375 g     3.375 g 100 mL/hr over 30 Minutes Intravenous  Once 01/31/17 2109 01/31/17 2153       Vince Ainsley J 02/05/2017

## 2017-02-05 NOTE — Progress Notes (Signed)
Pt asked for new mask because her home mask was leaking.  RT placed mask and set up with pt's machine.  RT explained to call RN/RT if she needed further assistance with cpap placement tonight.

## 2017-02-05 NOTE — Progress Notes (Signed)
Pt tolerating po fluids, medline mouth wash discontinued. Pt resting in bed quietly with no signs of distress noted

## 2017-02-05 NOTE — Progress Notes (Signed)
   02/05/17 1500  Vitals  BP 103/77  BP Location Right Arm  BP Method Automatic  Patient Position (if appropriate) Lying  Pulse Rate 98  Pulse Rate Source Dinamap  Resp (!) 22  Oxygen Therapy  SpO2 99 %  O2 Device Nasal Cannula  O2 Flow Rate (L/min) 2 L/min  Pt ambulated approximately 100yards in hallway. Received a call from central telemetry that pt was in constant vtach. Vs as recorded above. Pt laid back in back and converted back to sinus rhythm.

## 2017-02-05 NOTE — Evaluation (Signed)
Physical Therapy Evaluation Patient Details Name: [name] MRN: [id number] DOB: January 10, 1941 Today's Date: 02/05/2017   History of Present Illness  Pt is a 76 yo female admitted through ED on 01/31/17 with nausea and vomiting x 2 days. Pt was diagnosed with a perforated duodenal ulcer and underwent an laparscopic procedure with a drain placement. PMH significant for anxiety, asthma, back pain, HTN, lymphoma, SVT.   Clinical Impression  Pt presents with the above diagnosis and below deficits for therapy evaluation. Prior to admission, pt lived alone in a single level home with family living on the same property. Pt has 5 daughters who assist. Pt does work at a [employer] a day prior to admission. Pt requires Min a to Min guard A for all mobility this session with a RW with O2 sats ranging from 87% to 93%. Pt will benefit from continued acute PT services in order to address the below deficits before D/C to venue recommended below.     Follow Up Recommendations Home health PT;Supervision for mobility/OOB    Equipment Recommendations  None recommended by PT    Recommendations for Other Services       Precautions / Restrictions Precautions Precautions: Fall Restrictions Weight Bearing Restrictions: No      Mobility  Bed Mobility Overal bed mobility: Needs Assistance Bed Mobility: Supine to Sit     Supine to sit: Min assist;HOB elevated     General bed mobility comments: MIn A to scoot hips to EOB  Transfers Overall transfer level: Needs assistance Equipment used: Rolling walker (2 wheeled) Transfers: Sit to/from Stand Sit to Stand: Min guard         General transfer comment: Min guard for safety from EOB and from commode  Ambulation/Gait Ambulation/Gait assistance: Min guard Ambulation Distance (Feet): 40 Feet Assistive device: Rolling walker (2 wheeled) Gait Pattern/deviations: Step-through pattern;Decreased stride length Gait velocity: decreased Gait  velocity interpretation: Below normal speed for age/gender General Gait Details: minimal forward trunk lean with gait, decreased cadence and sequencing.   Stairs            Wheelchair Mobility    Modified Rankin (Stroke Patients Only)       Balance Overall balance assessment: Needs assistance Sitting-balance support: No upper extremity supported;Feet supported Sitting balance-Leahy Scale: Good     Standing balance support: Bilateral upper extremity supported Standing balance-Leahy Scale: Poor Standing balance comment: reliant on UE support in standing                             Pertinent Vitals/Pain Pain Assessment: Faces Faces Pain Scale: Hurts little more Pain Location: abdomen with mobility Pain Descriptors / Indicators: Grimacing;Guarding Pain Intervention(s): Monitored during session;Premedicated before session;Repositioned    Home Living Family/patient expects to be discharged to:: Private residence Living Arrangements: Alone Available Help at Discharge: Family;Available 24 hours/day Type of Home: House Home Access: Ramped entrance     Home Layout: One level Home Equipment: Walker - 2 wheels;Cane - single point;Bedside commode Additional Comments: famil lives nearby and on same property    Prior Function Level of Independence: Independent with assistive device(s)         Comments: used [place] 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     [place] Hand: Right    Extremity/Trunk Assessment   Upper Extremity Assessment Upper Extremity Assessment: Defer to OT evaluation    Lower Extremity Assessment Lower Extremity Assessment:  Generalized weakness    Cervical / Trunk Assessment Cervical / Trunk Assessment: Normal  Communication   Communication: No difficulties  Cognition Arousal/Alertness: Awake/alert Behavior During Therapy: WFL for tasks assessed/performed Overall Cognitive Status: Within Functional Limits for  tasks assessed                                        General Comments      Exercises     Assessment/Plan    PT Assessment Patient needs continued PT services  PT Problem List Decreased strength;Decreased activity tolerance;Decreased balance;Decreased mobility;Decreased coordination       PT Treatment Interventions DME instruction;Gait training;Functional mobility training;Therapeutic activities;Therapeutic exercise;Balance training    PT Goals (Current goals can be found in the Care Plan section)  Acute Rehab PT Goals Patient Stated Goal: to get home soon PT Goal Formulation: With patient Time For Goal Achievement: 02/19/17 Potential to Achieve Goals: Good    Frequency Min 3X/week   Barriers to discharge        Co-evaluation               AM-PAC PT "6 Clicks" Daily Activity  Outcome Measure Difficulty turning over in bed (including adjusting bedclothes, sheets and blankets)?: Total Difficulty moving from lying on back to sitting on the side of the bed? : Total Difficulty sitting down on and standing up from a chair with arms (e.g., wheelchair, bedside commode, etc,.)?: Total Help needed moving to and from a bed to chair (including a wheelchair)?: A Little Help needed walking in hospital room?: A Little Help needed climbing 3-5 steps with a railing? : A Lot 6 Click Score: 11    End of Session Equipment Utilized During Treatment: Gait belt Activity Tolerance: Patient tolerated treatment well Patient left: in chair;with call bell/phone within reach;with family/visitor present Nurse Communication: Mobility status PT Visit Diagnosis: Unsteadiness on feet (R26.81);Muscle weakness (generalized) (M62.81);Difficulty in walking, not elsewhere classified (R26.2)    Time: 7672-0947 PT Time Calculation (min) (ACUTE ONLY): 20 min   Charges:   PT Evaluation $PT Eval Moderate Complexity: 1 Procedure     PT G Codes:        Scheryl Marten PT, DPT   [phone]   Shanon Rosser 02/05/2017, 1:14 PM

## 2017-02-05 NOTE — Progress Notes (Signed)
MD text paged with update on latest development

## 2017-02-06 ENCOUNTER — Inpatient Hospital Stay (HOSPITAL_COMMUNITY): Payer: Medicare Other

## 2017-02-06 DIAGNOSIS — I472 Ventricular tachycardia: Secondary | ICD-10-CM

## 2017-02-06 LAB — BASIC METABOLIC PANEL
Anion gap: 9 (ref 5–15)
BUN: 5 mg/dL — ABNORMAL LOW (ref 6–20)
CALCIUM: 8.6 mg/dL — AB (ref 8.9–10.3)
CO2: 27 mmol/L (ref 22–32)
CREATININE: 0.77 mg/dL (ref 0.44–1.00)
Chloride: 102 mmol/L (ref 101–111)
GFR calc non Af Amer: >60 mL/min (ref >60)
Glucose, Bld: 128 mg/dL — ABNORMAL HIGH (ref 65–99)
Potassium: 3.7 mmol/L (ref 3.5–5.1)
SODIUM: 138 mmol/L (ref 135–145)

## 2017-02-06 LAB — MAGNESIUM: Magnesium: 1.8 mg/dL (ref 1.7–2.4)

## 2017-02-06 MED ORDER — DULOXETINE HCL 60 MG PO CPEP
60.0000 mg | ORAL_CAPSULE | Freq: Every day | ORAL | Status: DC
  Administered 2017-02-06 – 2017-02-10 (×5): 60 mg via ORAL
  Filled 2017-02-06 (×5): qty 1

## 2017-02-06 MED ORDER — METOPROLOL TARTRATE 50 MG PO TABS
50.0000 mg | ORAL_TABLET | Freq: Two times a day (BID) | ORAL | Status: DC
  Administered 2017-02-06: 50 mg via ORAL
  Filled 2017-02-06: qty 1

## 2017-02-06 MED ORDER — METOPROLOL TARTRATE 25 MG PO TABS
25.0000 mg | ORAL_TABLET | Freq: Once | ORAL | Status: AC
  Administered 2017-02-06: 25 mg via ORAL
  Filled 2017-02-06: qty 1

## 2017-02-06 MED ORDER — METOPROLOL TARTRATE 50 MG PO TABS
75.0000 mg | ORAL_TABLET | Freq: Two times a day (BID) | ORAL | Status: DC
  Administered 2017-02-06 – 2017-02-10 (×8): 75 mg via ORAL
  Filled 2017-02-06 (×8): qty 1

## 2017-02-06 MED ORDER — FUROSEMIDE 40 MG PO TABS
40.0000 mg | ORAL_TABLET | Freq: Every day | ORAL | Status: DC
  Administered 2017-02-06 – 2017-02-10 (×5): 40 mg via ORAL
  Filled 2017-02-06 (×5): qty 1

## 2017-02-06 MED ORDER — PANTOPRAZOLE SODIUM 40 MG PO TBEC
40.0000 mg | DELAYED_RELEASE_TABLET | Freq: Two times a day (BID) | ORAL | Status: DC
  Administered 2017-02-06 – 2017-02-10 (×9): 40 mg via ORAL
  Filled 2017-02-06 (×9): qty 1

## 2017-02-06 NOTE — Progress Notes (Signed)
Pt home CPAP at bedside.  Pt states she and her family will put it on when she is ready and she doesn't need RT assistance.  Pt encouraged to call if she changes her mind.

## 2017-02-06 NOTE — Evaluation (Signed)
Occupational Therapy Evaluation Patient Details Name: [name] MRN: [id number] DOB: Apr 04, 1941 Today's Date: 02/06/2017    History of Present Illness Pt is a 76 yo female admitted through ED on 01/31/17 with nausea and vomiting x 2 days. Pt was diagnosed with a perforated duodenal ulcer and underwent an laparscopic procedure with a drain placement. PMH significant for anxiety, asthma, back pain, HTN, lymphoma, SVT.    Clinical Impression   PTA, pt was living alone and was independent performing ADLs, IADLs, and working at a daycare. Family very supportive and will be staying with pt at dc. Pt currently requires Min A for LB ADLs and Min Guard A for functional mobility with RW. Pt demonstrating decreased activity tolerance as seen by SOB and fatigue throughout session. Pt would benefit from acute OT to increase activity tolerance, safety, and independence with ADLs and functional mobility. Recommend dc home once medically stable per physician.     Follow Up Recommendations  No OT follow up;Supervision/Assistance - 24 hour    Equipment Recommendations  None recommended by OT    Recommendations for Other Services PT consult     Precautions / Restrictions Precautions Precautions: Fall Precaution Comments: SOB Restrictions Weight Bearing Restrictions: No      Mobility Bed Mobility Overal bed mobility: Needs Assistance Bed Mobility: Supine to Sit     Supine to sit: HOB elevated;Supervision     General bed mobility comments: supervision with HOB elevated. Pt requires increased time  Transfers Overall transfer level: Needs assistance Equipment used: Rolling walker (2 wheeled) Transfers: Sit to/from Stand Sit to Stand: Min guard         General transfer comment: Min guard for safety from EOB and from commode    Balance Overall balance assessment: Needs assistance Sitting-balance support: No upper extremity supported;Feet supported Sitting balance-Leahy Scale: Good      Standing balance support: Bilateral upper extremity supported Standing balance-Leahy Scale: Poor Standing balance comment: reliant on UE support in standing                           ADL either performed or assessed with clinical judgement   ADL Overall ADL's : Needs assistance/impaired Eating/Feeding: Set up;Sitting   Grooming: Min guard;Standing   Upper Body Bathing: Set up;Sitting   Lower Body Bathing: Minimal assistance;Sit to/from stand   Upper Body Dressing : Set up;Sitting   Lower Body Dressing: Minimal assistance;Sit to/from stand Lower Body Dressing Details (indicate cue type and reason): Pt donned underwear with Min A to slip feet into pant legs. Pt states that she has AE from prior back surgery and recieved OT learning how to use AE for LB dressing Toilet Transfer: Min guard;Ambulation;Regular Toilet;Grab bars;RW   Toileting- Water quality scientist and Hygiene: Min guard;Sit to/from stand       Functional mobility during ADLs: Min guard;Rolling walker General ADL Comments: Pt demonstrating decreased activity tolerance as seen by SOB and fatigue. Pt perform functional mobility to toilet. After completeing toilet hygiene pt reports she was very fatigued and needed to sit down. After a rest break, pt able to perform LB dressing and reports feeling fatigued again. Feel pt would benefit from energy conservation education and handout.      Vision         Perception     Praxis      Pertinent Vitals/Pain Pain Location: abdomen with mobility Pain Descriptors / Indicators: Grimacing;Guarding     Hand Dominance Right  Extremity/Trunk Assessment Upper Extremity Assessment Upper Extremity Assessment: Generalized weakness   Lower Extremity Assessment Lower Extremity Assessment: Generalized weakness   Cervical / Trunk Assessment Cervical / Trunk Assessment: Normal   Communication Communication Communication: No difficulties   Cognition  Arousal/Alertness: Awake/alert Behavior During Therapy: WFL for tasks assessed/performed Overall Cognitive Status: Within Functional Limits for tasks assessed                                     General Comments  Daughter present throughout session. Pt SpO2 ~89-95 with activity on room air throughout session. Pt requested O2; placed pt back on 2L O2 at end of session and SpO2 elevated to high 90s. Notified RN that O2 was placed. HR 131 during LB dressing    Exercises     Shoulder Instructions      Home Living Family/patient expects to be discharged to:: Private residence Living Arrangements: Alone Available Help at Discharge: Family;Available 24 hours/day Type of Home: House Home Access: Ramped entrance     Home Layout: One level     Bathroom Shower/Tub: Occupational psychologist: Standard Bathroom Accessibility: Yes   Home Equipment: Environmental consultant - 2 wheels;Cane - single point;Bedside commode;Grab bars - tub/shower;Adaptive equipment Adaptive Equipment: Reacher Additional Comments: famil lives nearby and on same property      Prior Functioning/Environment Level of Independence: Independent with assistive device(s)        Comments: ADls, IADLs, and worked in a daycare center; pt states that she wears compression socks and daughter's assist with donning/doffing those.         OT Problem List: Decreased activity tolerance;Decreased range of motion;Impaired balance (sitting and/or standing);Pain;Decreased knowledge of use of DME or AE      OT Treatment/Interventions: Self-care/ADL training;Therapeutic exercise;Energy conservation;DME and/or AE instruction;Therapeutic activities;Patient/family education    OT Goals(Current goals can be found in the care plan section) Acute Rehab OT Goals Patient Stated Goal: to get home soon OT Goal Formulation: With patient Time For Goal Achievement: 02/20/17 Potential to Achieve Goals: Good ADL Goals Pt Will  Perform Grooming: with set-up;with supervision;standing Pt Will Perform Upper Body Dressing: with modified independence;sitting Pt Will Perform Lower Body Dressing: with modified independence;with adaptive equipment;sit to/from stand Pt Will Transfer to Toilet: with modified independence;regular height toilet;ambulating Additional ADL Goal #1: Pt will verbalize three energy conservation techniques with Min VCs  OT Frequency: Min 2X/week   Barriers to D/C:            Co-evaluation              AM-PAC PT "6 Clicks" Daily Activity     Outcome Measure Help from another person eating meals?: None Help from another person taking care of personal grooming?: A Little Help from another person toileting, which includes using toliet, bedpan, or urinal?: A Little Help from another person bathing (including washing, rinsing, drying)?: A Little Help from another person to put on and taking off regular upper body clothing?: None Help from another person to put on and taking off regular lower body clothing?: A Little 6 Click Score: 20   End of Session Equipment Utilized During Treatment: Gait belt;Rolling walker Nurse Communication: Mobility status;Other (comment) (O2)  Activity Tolerance: Patient tolerated treatment well;Patient limited by fatigue Patient left: in chair;with call bell/phone within reach;with family/visitor present  OT Visit Diagnosis: Unsteadiness on feet (R26.81);Muscle weakness (generalized) (M62.81)  Time: 9022-8406 OT Time Calculation (min): 37 min Charges:  OT General Charges $OT Visit: 1 Procedure OT Evaluation $OT Eval Low Complexity: 1 Procedure OT Treatments $Self Care/Home Management : 8-22 mins G-Codes:     Berdina Cheever MSOT, OTR/L Acute Rehab Pager: [phone] Office: [place] 02/06/2017, 12:11 PM

## 2017-02-06 NOTE — Progress Notes (Signed)
Assessment Contained perforation of DU s/p laparoscopy and drain placement 02/01/17 (Dr. Cornett)-continues to improve with current treatment regimen   VT  Plan:  Advance to soft diet.  Continue abxs and PPI.  Cardiology consult (called). Restart Lasix, Metoprolol, Cymbalta.   LOS: 6 days     5 Days Post-Op  Chief Complaint/Subjective: Had a sustained run of VT while walking yesterday.  No change in her shortness of breath.  Has this at home as well.  She has a h/o SVT and had seen a Cardiologist in the past (who is now retired). Tolerating full liquid diet. Objective: Vital signs in last 24 hours: Temp:  [98.2 F (36.8 C)-98.6 F (37 C)] 98.4 F (36.9 C) (07/08 0513) Pulse Rate:  [78-98] 78 (07/08 0513) Resp:  [17-22] 18 (07/08 0513) BP: (103-135)/(59-77) 121/69 (07/08 0513) SpO2:  [95 %-99 %] 96 % (07/08 0513) FiO2 (%):  [28 %] 28 % (07/07 1134) Last BM Date: 02/03/17  Intake/Output from previous day: 07/07 0701 - 07/08 0700 In: 1613.5 [P.O.:840; I.V.:723.5; IV Piggyback:50] Out: 409 [Urine:700; Drains:175] Intake/Output this shift: No intake/output data recorded.  PE: General- In NAD.  Awake and alert. CV-RRR Lungs-soft bilateral wheezes Abdomen-soft, wounds clean, thin serosanguinous drain output  Lab Results:   Recent Labs  02/03/17 2347  WBC 7.7  HGB 9.8*  HCT 32.0*  PLT 302   BMET  Recent Labs  02/03/17 2347  NA 139  K 3.9  CL 106  CO2 28  GLUCOSE 149*  BUN 6  CREATININE 0.72  CALCIUM 8.4*   PT/INR No results for input(s): LABPROT, INR in the last 72 hours. Comprehensive Metabolic Panel:    Component Value Date/Time   NA 139 02/03/2017 2347   NA 140 02/03/2017 0636   K 3.9 02/03/2017 2347   K 3.7 02/03/2017 0636   CL 106 02/03/2017 2347   CL 104 02/03/2017 0636   CO2 28 02/03/2017 2347   CO2 31 02/03/2017 0636   BUN 6 02/03/2017 2347   BUN 6 02/03/2017 0636   CREATININE 0.72 02/03/2017 2347   CREATININE 0.77 02/03/2017 0636   GLUCOSE 149 (H) 02/03/2017 2347   GLUCOSE 142 (H) 02/03/2017 0636   CALCIUM 8.4 (L) 02/03/2017 2347   CALCIUM 8.3 (L) 02/03/2017 0636   AST 13 (L) 02/03/2017 2347   AST 24 01/31/2017 1655   ALT 8 (L) 02/03/2017 2347   ALT 17 01/31/2017 1655   ALKPHOS 53 02/03/2017 2347   ALKPHOS 87 01/31/2017 1655   BILITOT 0.9 02/03/2017 2347   BILITOT 1.4 (H) 01/31/2017 1655   PROT 5.5 (L) 02/03/2017 2347   PROT 7.4 01/31/2017 1655   ALBUMIN 2.5 (L) 02/03/2017 2347   ALBUMIN 3.4 (L) 01/31/2017 1655     Studies/Results: No results found.  Anti-infectives: Anti-infectives    Start     Dose/Rate Route Frequency Ordered Stop   02/01/17 1830  cefoTEtan (CEFOTAN) 2 g in dextrose 5 % 50 mL IVPB  Status:  Discontinued     2 g 100 mL/hr over 30 Minutes Intravenous  Once 02/01/17 1817 02/01/17 1907   02/01/17 0300  piperacillin-tazobactam (ZOSYN) IVPB 3.375 g     3.375 g 12.5 mL/hr over 240 Minutes Intravenous Every 8 hours 01/31/17 2154     01/31/17 2115  piperacillin-tazobactam (ZOSYN) IVPB 3.375 g     3.375 g 100 mL/hr over 30 Minutes Intravenous  Once 01/31/17 2109 01/31/17 2153       [name] J 02/06/2017

## 2017-02-06 NOTE — Consult Note (Signed)
Cardiology Consultation:   Patient ID: AVIONA MARTENSON; [id number]; 06-16-1941   Admit date: 01/31/2017 Date of Consult: 02/06/2017  Primary Care Provider: Leonel Ramsay, MD Primary Cardiologist: New to [facility]   Patient Profile:   [name] is a 76 y.o. female with a hx of lymphoma in remission, SVT/afib, HTN, HLD and obesity  who is being seen today for the evaluation of  NSVT at the request of Dr. Zella Richer.   A previous patient of Dr. Doreatha Lew. Followed in 2009 for tachycardia (SVT vs afib) when she was initially diagnosed with lymphoma. No recurrence since this admission.   Last echo 12/12 showed LVEF of 65-70%, mild MR, mild dilated LA and RA., mild pulmonary HTN.   History of Present Illness:   [name] Initially presented with nausea and vomiting 7/2. Workup revealed perforated duodenal ulcer s/p laparoscopy and drain placement 02/01/17. Patient noted to have a nonsustained VT with ambulation since yesterday and cardiology is called for further evaluation.  EKG on admission 01/31/17 showed sinus rhythm at rate of 78 minute and left bundle-branch block gas personally reviewed. Telemetry shows sinus rhythm currently at controlled rate. Intermittently rate goes to 130s to 140s with rhythm looks like VT versus A. Fib - Personally reviewed. Noted palpitation with ambulation.   She endorsed to having recent shortness of breath exertion. Also noted intermittent lower extremity edema. Improve on stocking. No chest pain, dizziness, syncope, orthopnea or PND.    Past Medical History:  Diagnosis Date  . Anxiety   . Asthma    hx of  . Back pain   . Chronic constipation   . Depression    hx of  . High cholesterol   . Hypertension   . Lymphoma ([place])   . Obesity   . SVT (supraventricular tachycardia) ([facility])     Past Surgical History:  Procedure Laterality Date  . BACK SURGERY  2009  . BONE BIOPSY    . CESAREAN SECTION    . EYE SURGERY    . JOINT REPLACEMENT    . LAPAROSCOPY  N/A 02/01/2017   Procedure: LAPAROSCOPY DIAGNOSTIC;  Surgeon: Erroll Luna, MD;  Location: [place];  Service: General;  Laterality: N/A;  . REPLACEMENT TOTAL KNEE     right     Inpatient Medications: Scheduled Meds: . budesonide  0.25 mg Nebulization BID  . DULoxetine  60 mg Oral Daily  . enoxaparin (LOVENOX) injection  40 mg Subcutaneous Q24H  . furosemide  40 mg Oral Daily  . ipratropium  0.5 mg Nebulization TID  . levalbuterol  0.63 mg Nebulization TID  . metoprolol tartrate  50 mg Oral BID  . montelukast  10 mg Oral Daily  . pantoprazole  40 mg Oral BID   Continuous Infusions: . sodium chloride Stopped (02/02/17 2000)  . dextrose 5 % and 0.9 % NaCl with KCl 20 mEq/L 30 mL/hr at 02/05/17 1217  . phenylephrine (NEO-SYNEPHRINE) Adult infusion Stopped (02/02/17 2000)  . piperacillin-tazobactam (ZOSYN)  IV Stopped (02/06/17 0700)   PRN Meds: acetaminophen **OR** acetaminophen, HYDROmorphone (DILAUDID) injection, ipratropium, levalbuterol, ondansetron (ZOFRAN) IV  Allergies:   No Known Allergies  Social History:   Social History   Social History  . Marital status: Married    Spouse name: N/A  . Number of children: 5  . Years of education: N/A   Occupational History  . Not on file.   Social History Main Topics  . Smoking status: Never Smoker  . Smokeless tobacco: Never Used  .  Alcohol use No  . Drug use: No  . Sexual activity: Not on file   Other Topics Concern  . Not on file   Social History Narrative  . No narrative on file    Family History:   The patient's family history includes Breast cancer in her mother; Coronary artery disease (age of onset: 20) in her father; Lymphoma in her mother; Stroke in her father.  ROS:  Please see the history of present illness.  ROS All other ROS reviewed and negative.     Physical Exam/Data:   Vitals:   02/05/17 2038 02/05/17 2138 02/06/17 0513 02/06/17 0933  BP:  (!) 133/59 121/69   Pulse:  85 78   Resp:  18 18     Temp:  98.6 F (37 C) 98.4 F (36.9 C)   TempSrc:  Oral Oral   SpO2: 95% 96% 96% 95%  Weight:      Height:        Intake/Output Summary (Last 24 hours) at 02/06/17 1116 Last data filed at 02/06/17 0925  Gross per 24 hour  Intake           1733.5 ml  Output              845 ml  Net            888.5 ml   Filed Weights   01/31/17 1659 02/01/17 0049 02/01/17 1800  Weight: 250 lb (113.4 kg) 261 lb 7.5 oz (118.6 kg) 274 lb 0.5 oz (124.3 kg)   Body mass index is 48.54 kg/m.  General: Obese female in no acute distress HEENT: normal Lymph: no adenopathy Neck: Difficult to assess JVD due to grith Endocrine:  No thryomegaly Vascular: No carotid bruits; FA pulses 2+ bilaterally without bruits  Cardiac:  normal S1, S2; RRR; no murmur Lungs:  Diminished breath sounds with diffuse wheezing Abd: soft, Clean wound site Ext: trace BL LE edema Musculoskeletal:  No deformities, BUE and BLE strength normal and equal Skin: warm and dry  Neuro:  CNs 2-12 intact, no focal abnormalities noted Psych:  Normal affect    Laboratory Data:  Chemistry Recent Labs Lab 02/01/17 0241 02/01/17 1529 02/03/17 0636 02/03/17 2347  NA 134* 140 140 139  K 3.2* 3.6 3.7 3.9  CL 96*  --  104 106  CO2 30  --  31 28  GLUCOSE 140*  --  142* 149*  BUN 15  --  6 6  CREATININE 1.08*  --  0.77 0.72  CALCIUM 8.5*  --  8.3* 8.4*  GFRNONAA 48*  --  >60 >60  GFRAA 56*  --  >60 >60  ANIONGAP 8  --  5 5     Recent Labs Lab 01/31/17 1655 02/03/17 2347  PROT 7.4 5.5*  ALBUMIN 3.4* 2.5*  AST 24 13*  ALT 17 8*  ALKPHOS 87 53  BILITOT 1.4* 0.9   Hematology Recent Labs Lab 02/01/17 0241 02/01/17 1529 02/03/17 0636 02/03/17 2347  WBC 14.2*  --  8.0 7.7  RBC 3.75*  --  3.48* 3.34*  HGB 10.9* 9.5* 10.1* 9.8*  HCT 34.6* 28.0* 33.1* 32.0*  MCV 92.3  --  95.1 95.8  MCH 29.1  --  29.0 29.3  MCHC 31.5  --  30.5 30.6  RDW 13.6  --  13.6 13.9  PLT 284  --  280 302   Cardiac Enzymes Recent Labs Lab  01/31/17 1751  TROPONINI <0.03   No results for input(s):  TROPIPOC in the last 168 hours.  BNP Recent Labs Lab 01/31/17 1655  BNP 74.2     Radiology/Studies:  Dg Chest 2 View  Result Date: 02/06/2017 CLINICAL DATA:  Patient with right-sided pain. EXAM: CHEST  2 VIEW COMPARISON:  Chest radiograph 02/01/2017 FINDINGS: Monitoring leads overlie the patient. Stable cardiomegaly. Low lung volumes. Minimal basilar heterogeneous opacities. Small left pleural effusion. Thoracic spinal hardware. Kyphoplasty material within multiple thoracic spine vertebral bodies. IMPRESSION: Small left pleural effusion. Left-greater-than-right basilar atelectasis. Electronically Signed   By: Lovey Newcomer M.D.   On: 02/06/2017 10:44    Assessment and Plan:   1. Sinus arrhythmia and new LBBB - Rhythm looked likes ventricular tachycardia with wide QRS versus A. Fib with ambulation in setting of LBBB. Currently in sinus rhythm at a controlled rate. We will check electrolytes. increase metoprolol 75 mg twice a day. Get echo. Likely stress test as outpatient.  Will review with M.D. Advised nurse to get EKG during tachycardia.   2. Mild volume overload - BNP normal on admission. Chest x-ray today shows left pleural effusion and atelectasis L > R. Encourage incentive spirometry. Continue Lasix.  3. Hypertension - Blood pressure is stable and well controlled on current regimen.   Jarrett Soho, PA  02/06/2017 11:16 AM   I have seen and examined this patient with Leanor Kail.  Agree with above, note added to reflect my findings.  On exam, RRR, no murmurs, lungs clear. Is s/p drain placement for perforated duodenal ulcer. With ambulation had wide complex tachycardia. Has been having lower extremity edema which is new for her for the last few months. In exam of telemetry, it appears that she has had atrial fibrillation at times. The wide complex tachycardia is both regular and irregular at times. This  could be due to rapid atrial fibrillation vs a ventricular arrhythmia. Of note, the morphology of the QRS does not change much on her telemetry. Will get a TTE to further evaluate her LVEF as she has a new LBBB and now what appears to be atrial fibrillation. Will also increase her metoprolol.  Would be helpful to get an ECG during tachycardia as well.  Will M. Camnitz MD 02/06/2017 12:12 PM

## 2017-02-07 ENCOUNTER — Inpatient Hospital Stay (HOSPITAL_COMMUNITY): Payer: Medicare Other

## 2017-02-07 ENCOUNTER — Other Ambulatory Visit (HOSPITAL_COMMUNITY): Payer: Medicare Other

## 2017-02-07 DIAGNOSIS — I471 Supraventricular tachycardia: Secondary | ICD-10-CM

## 2017-02-07 DIAGNOSIS — I340 Nonrheumatic mitral (valve) insufficiency: Secondary | ICD-10-CM

## 2017-02-07 DIAGNOSIS — K275 Chronic or unspecified peptic ulcer, site unspecified, with perforation: Secondary | ICD-10-CM

## 2017-02-07 LAB — ECHOCARDIOGRAM COMPLETE
HEIGHTINCHES: 63 in
Weight: 4384.51 oz

## 2017-02-07 LAB — TSH: TSH: 2.704 u[IU]/mL (ref 0.350–4.500)

## 2017-02-07 MED ORDER — POTASSIUM CHLORIDE CRYS ER 20 MEQ PO TBCR
20.0000 meq | EXTENDED_RELEASE_TABLET | Freq: Once | ORAL | Status: AC
  Administered 2017-02-07: 20 meq via ORAL
  Filled 2017-02-07: qty 1

## 2017-02-07 NOTE — Progress Notes (Signed)
Physical Therapy Treatment Patient Details Name: [name] MRN: [id number] DOB: [birth year] Today's Date: 02/07/2017    History of Present Illness Pt is a 75 yo female admitted through ED on 01/31/17 with nausea and vomiting x 2 days. Pt was diagnosed with a perforated duodenal ulcer and underwent an laparscopic procedure with a drain placement. PMH significant for anxiety, asthma, back pain, HTN, lymphoma, SVT.     PT Comments    Pt reports stiffness in chest this am but agrees to mobilize with PT. Pt continues to have decreased activity tolerance secondary to SOB during ambulation. However, pt's VSS (SpO2 above 90%) throughout session while on 2 L O2. Pt will continue to benefit from continued acute therapy for mobilization and improved activity tolerance for safe d/c home.    Follow Up Recommendations  Home health PT;Supervision for mobility/OOB     Equipment Recommendations  None recommended by PT    Recommendations for Other Services       Precautions / Restrictions Precautions Precautions: Fall Precaution Comments: SOB Restrictions Weight Bearing Restrictions: No    Mobility  Bed Mobility Overal bed mobility: Needs Assistance Bed Mobility: Supine to Sit     Supine to sit: HOB elevated;Supervision     General bed mobility comments: supervision with HOB elevated. Pt requires increased time  Transfers Overall transfer level: Needs assistance   Transfers: Sit to/from Stand Sit to Stand: Min guard         General transfer comment: Min guard for safety from EOB   Ambulation/Gait Ambulation/Gait assistance: Min guard Ambulation Distance (Feet): 100 Feet Assistive device: Rolling walker (2 wheeled) Gait Pattern/deviations: Step-through pattern;Decreased stride length Gait velocity: decreased Gait velocity interpretation: Below normal speed for age/gender General Gait Details: Minimal forward trunk lean with gait with decreased cadence. Chair follow; pt required  two seated rest breaks during 100 ft ambulation with RW.    Stairs            Wheelchair Mobility    Modified Rankin (Stroke Patients Only)       Balance Overall balance assessment: Needs assistance Sitting-balance support: No upper extremity supported;Feet supported Sitting balance-Leahy Scale: Good     Standing balance support: Bilateral upper extremity supported;During functional activity Standing balance-Leahy Scale: Poor Standing balance comment: Reliant on Bil UE support with RW in standing                            Cognition Arousal/Alertness: Awake/alert Behavior During Therapy: WFL for tasks assessed/performed Overall Cognitive Status: Within Functional Limits for tasks assessed                                        Exercises      General Comments        Pertinent Vitals/Pain Pain Assessment: 0-10 Pain Score: 6  Pain Location: chest and abdomen Pain Descriptors / Indicators: Tightness;Grimacing Pain Intervention(s): Limited activity within patient's tolerance;Monitored during session;Repositioned    Home Living                      Prior Function            PT Goals (current goals can now be found in the care plan section) Acute Rehab PT Goals Patient Stated Goal: to get home soon PT Goal Formulation: With patient/family Time For Goal Achievement:  02/21/17 Potential to Achieve Goals: Good Progress towards PT goals: Progressing toward goals    Frequency    Min 3X/week      PT Plan Current plan remains appropriate    Co-evaluation              AM-PAC PT "6 Clicks" Daily Activity  Outcome Measure  Difficulty turning over in bed (including adjusting bedclothes, sheets and blankets)?: A Little Difficulty moving from lying on back to sitting on the side of the bed? : Total Difficulty sitting down on and standing up from a chair with arms (e.g., wheelchair, bedside commode, etc,.)?:  Total Help needed moving to and from a bed to chair (including a wheelchair)?: A Little Help needed walking in hospital room?: A Little Help needed climbing 3-5 steps with a railing? : A Lot 6 Click Score: 13    End of Session Equipment Utilized During Treatment: Gait belt;Oxygen Activity Tolerance: Patient limited by fatigue Patient left: in chair;with call bell/phone within reach;with family/visitor present Nurse Communication: Mobility status PT Visit Diagnosis: Unsteadiness on feet (R26.81);Muscle weakness (generalized) (M62.81);Difficulty in walking, not elsewhere classified (R26.2)     Time:  -     Charges:                       G Codes:       Elberta Leatherwood, SPT Acute Rehab [place] 02/07/2017, 9:51 AM

## 2017-02-07 NOTE — Progress Notes (Signed)
Progress Note  Patient Name: [name] Date of Encounter: 02/07/2017  Primary Cardiologist: Dr. Doreatha Lew remotely  Subjective   No CP. Reports chronic DOE r/t asthma that is not new. No abd pain this afternoon.  Inpatient Medications    Scheduled Meds: . budesonide  0.25 mg Nebulization BID  . DULoxetine  60 mg Oral Daily  . enoxaparin (LOVENOX) injection  40 mg Subcutaneous Q24H  . furosemide  40 mg Oral Daily  . ipratropium  0.5 mg Nebulization TID  . levalbuterol  0.63 mg Nebulization TID  . metoprolol tartrate  75 mg Oral BID  . montelukast  10 mg Oral Daily  . pantoprazole  40 mg Oral BID   Continuous Infusions: . sodium chloride Stopped (02/02/17 2000)  . dextrose 5 % and 0.9 % NaCl with KCl 20 mEq/L 30 mL/hr at 02/06/17 2229  . phenylephrine (NEO-SYNEPHRINE) Adult infusion Stopped (02/02/17 2000)  . piperacillin-tazobactam (ZOSYN)  IV 3.375 g (02/07/17 1213)   PRN Meds: acetaminophen **OR** acetaminophen, HYDROmorphone (DILAUDID) injection, ipratropium, levalbuterol, ondansetron (ZOFRAN) IV   Vital Signs    Vitals:   02/07/17 1126 02/07/17 1127 02/07/17 1129 02/07/17 1340  BP:    123/71  Pulse:    64  Resp:    17  Temp:    99.2 F (37.3 C)  TempSrc:    Oral  SpO2: 98% 100% 95% 95%  Weight:      Height:        Intake/Output Summary (Last 24 hours) at 02/07/17 1419 Last data filed at 02/07/17 1213  Gross per 24 hour  Intake              480 ml  Output              100 ml  Net              380 ml   Filed Weights   01/31/17 1659 02/01/17 0049 02/01/17 1800  Weight: 250 lb (113.4 kg) 261 lb 7.5 oz (118.6 kg) 274 lb 0.5 oz (124.3 kg)    Telemetry    NSR brief run of irregular WCT (same axis as prior) - Personally Reviewed  Physical Exam   GEN: No acute distress. Up in chair HEENT: Normocephalic, atraumatic, sclera non-icteric. Neck: No JVD or bruits. Cardiac: RRR no murmurs, rubs, or gallops.  Radials/DP/PT 1+ and equal bilaterally.    Respiratory: Faint scattered wheezing. No rales or rhonchi. Breathing is unlabored. GI: Soft, nontender, non-distended, BS +x 4. MS: no deformity. Extremities: No clubbing or cyanosis. Trace sockline edema superimposed on large leg habitus. Distal pedal pulses are 2+ and equal bilaterally. Neuro:  AAOx3. Follows commands. Psych:  Responds to questions appropriately with a normal affect.  Labs    Chemistry Recent Labs Lab 01/31/17 1655  02/03/17 0636 02/03/17 2347 02/06/17 1145  NA 135  < > 140 139 138  K 3.4*  < > 3.7 3.9 3.7  CL 92*  < > 104 106 102  CO2 32  < > 31 28 27   GLUCOSE 103*  < > 142* 149* 128*  BUN 12  < > 6 6 5*  CREATININE 1.14*  < > 0.77 0.72 0.77  CALCIUM 9.1  < > 8.3* 8.4* 8.6*  PROT 7.4  --   --  5.5*  --   ALBUMIN 3.4*  --   --  2.5*  --   AST 24  --   --  13*  --  ALT 17  --   --  8*  --   ALKPHOS 87  --   --  53  --   BILITOT 1.4*  --   --  0.9  --   GFRNONAA 45*  < > >60 >60 >60  GFRAA 52*  < > >60 >60 >60  ANIONGAP 11  < > 5 5 9   < > = values in this interval not displayed.   Hematology Recent Labs Lab 02/01/17 0241 02/01/17 1529 02/03/17 0636 02/03/17 2347  WBC 14.2*  --  8.0 7.7  RBC 3.75*  --  3.48* 3.34*  HGB 10.9* 9.5* 10.1* 9.8*  HCT 34.6* 28.0* 33.1* 32.0*  MCV 92.3  --  95.1 95.8  MCH 29.1  --  29.0 29.3  MCHC 31.5  --  30.5 30.6  RDW 13.6  --  13.6 13.9  PLT 284  --  280 302    Cardiac Enzymes Recent Labs Lab 01/31/17 1751  TROPONINI <0.03   No results for input(s): TROPIPOC in the last 168 hours.   BNP Recent Labs Lab 01/31/17 1655  BNP 74.2     DDimer No results for input(s): DDIMER in the last 168 hours.   Radiology    Dg Chest 2 View  Result Date: 02/06/2017 CLINICAL DATA:  Patient with right-sided pain. EXAM: CHEST  2 VIEW COMPARISON:  Chest radiograph 02/01/2017 FINDINGS: Monitoring leads overlie the patient. Stable cardiomegaly. Low lung volumes. Minimal basilar heterogeneous opacities. Small left  pleural effusion. Thoracic spinal hardware. Kyphoplasty material within multiple thoracic spine vertebral bodies. IMPRESSION: Small left pleural effusion. Left-greater-than-right basilar atelectasis. Electronically Signed   By: Lovey Newcomer M.D.   On: 02/06/2017 10:44    Cardiac Studies   Pending echo  Patient Profile     76 y.o. 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 Cardiology following for tachycardia and new LBBB.  Assessment & Plan    1. Perforated duodenal ulcer - per surgery.  2. Tachycardia - telemetry reviewed, agreed this is very difficult to discern. There were brief episodes of irregularity yesterday then very fast sustained arrhythmia which looks like it could be VT but cannot exclude SVT. There has been question of atrial fib as well. She also has a new LBBB. 2D echocardiogram is pending. Metoprolol was increased and telemetry thus far today is quiescent but she does have some wheezing on exam. May consider changing to bisoprolol if this persists. Do not see commentary regarding anticoagulation but in the setting of recent perforated duodenal ulcer and anemia, would hold off at present time. Add TSH to labs. Give 62meq KCl for K 3.7. Will hold off Mg repletion given level of 1.8 and recent GI surgery (do not want to precipitate GI side effects). F/u level in AM.  3. HTN - BP controlled.  Signed, Charlie Pitter, PA-C  02/07/2017, 2:19 PM    Personally seen and examined. Agree with above.  76 year old with perforated duodenal ulcer, SVT vs AFIB, LBBB.  - Increased metoprolol to 75mg  BID yesterday and telemetry is improved (personally reviewed)  - BP improved  - As inflammation improves, SVT should improve as well.  - Wheezing is chronic. Be careful with fluid administration  - Transthoracic echo is  pending. Explained to her.   Candee Furbish, MD

## 2017-02-07 NOTE — Care Management Important Message (Signed)
Important Message  Patient Details  Name: [name] MRN: [id number] Date of Birth: Aug 08, 1940   Medicare Important Message Given:  Yes    Nikolaos Maddocks Montine Circle 02/07/2017, 3:33 PM

## 2017-02-07 NOTE — Consult Note (Signed)
[facility] CM Primary Care Navigator  02/07/2017  [name] Jan 03, 1941 [id number]   Met with patientand daughter [name]) at the bedside to identify possible discharge needs. Patient reports having severe lower abdominal pain that had led to this admission. Patient endorses Dr.Daniel Philip Aspen with [facility] as the primary care provider.   Patient shared using [facility] [employer] in [place] to obtain medications without any problem.   Patient reports managing her ownmedications at home using "pill box"systemfilled weekly.   Patient verbalized being able to drive prior to admission. Her children (5 daughters- living nearby) will be providing transportation to her doctors' appointments after discharge as stated.  Patient lives alone and reports being independent with care. According to daughter, her family (daughters- will alternate) will be helping her out with careat home .   Discharge plan is home with home health services per daughter.  Patient and daughter voiced understanding to call primary care provider's office for a post discharge follow-up appointment within a week or sooner if needs arise.Patient letter (with PCP's contact number) was provided as a reminder.  Explained to patientabout [facility] CM services available for healthmanagement but she denies any current needs or concerns at this time.  Patient had opted and verbally agreed to [place] to follow her up at home as she recovers. Referral to [place] made.  [facility] care management information provided for future needs that may arise.   For questions, please contact:  Dannielle Huh, BSN, RN- [facility] Primary Care Navigator  Telephone: [phone] [place]

## 2017-02-07 NOTE — Care Management Note (Addendum)
Case Management Note  Patient Details  Name: [name] MRN: [id number] Date of Birth: 01-27-1941  Subjective/Objective:      Contained perforated duodenal ulcer, s/p diagnostic laparoscopy with drain placement, VT with ambulation, mild volume overload              Action/Plan: Discharge Planning: NCM spoke to pt and dtr, Darlene at bedside. Pt lives alone but states she has 5 dtrs that live within 1 mile. Offered choice for [facility]. Pt states she had [facility] in the past for [facility]. Has CPAP ([facility]), RW, bedside commode and cane at home. Pt is currently on oxygen with sats dropping down in low 90's will have Unit RN Check walking sat. Pt may possible need oxygen for home. Contacted [facility] with new referral. Will continue to follow for dc needs.   PCP Leonel Ramsay MD  Expected Discharge Date:  02/04/17               Expected Discharge Plan:  [place]  In-House Referral:  NA  Discharge planning Services  CM Consult  Post Acute Care Choice:  Home Health Choice offered to:  Patient  DME Arranged:    DME Agency:     HH Arranged:  PT [place]:  [facility]  Status of Service:  In process, will continue to follow  If discussed at Long Length of Stay Meetings, dates discussed:    Additional Comments:  Erenest Rasher, RN 02/07/2017, 11:15 AM

## 2017-02-07 NOTE — Progress Notes (Signed)
[facility] Progress Note  6 Days Post-Op  Subjective: CC: VT with ambulation Patient seen by cardiology and supposed to go for TEE today, she is a little nervous about this. Tolerating soft diet, denies n/v. Having some abdominal soreness but denies any sharp/burning pain. +BMs.   Objective: Vital signs in last 24 hours: Temp:  [97.9 F (36.6 C)-98.4 F (36.9 C)] 98.4 F (36.9 C) (07/09 0500) Pulse Rate:  [69-70] 70 (07/09 0500) Resp:  [16-28] 22 (07/09 0523) BP: (142-172)/(57-67) 172/66 (07/09 0500) SpO2:  [94 %-99 %] 96 % (07/09 0705) Last BM Date: 02/06/17  Intake/Output from previous day: 07/08 0701 - 07/09 0700 In: 720 [P.O.:720] Out: -  Intake/Output this shift: No intake/output data recorded.   I emptied 25cc from JP drain, serous fluid.  PE: Gen:  Alert, NAD, pleasant Card:  Regular rate and rhythm, pedal pulses 2+ BL, 3-4+ pitting edema in bilateral LEs Pulm:  Normal effort, clear to auscultation bilaterally, Nasal cannula on 2L Abd: Soft, non-tender, non-distended, bowel sounds present , incisions C/D/I. JP drain with serous drainage.  Skin: warm and dry, no rashes  Psych: A&Ox3   BMET  Recent Labs  02/06/17 1145  NA 138  K 3.7  CL 102  CO2 27  GLUCOSE 128*  BUN 5*  CREATININE 0.77  CALCIUM 8.6*   CMP     Component Value Date/Time   NA 138 02/06/2017 1145   K 3.7 02/06/2017 1145   CL 102 02/06/2017 1145   CO2 27 02/06/2017 1145   GLUCOSE 128 (H) 02/06/2017 1145   BUN 5 (L) 02/06/2017 1145   CREATININE 0.77 02/06/2017 1145   CALCIUM 8.6 (L) 02/06/2017 1145   PROT 5.5 (L) 02/03/2017 2347   ALBUMIN 2.5 (L) 02/03/2017 2347   AST 13 (L) 02/03/2017 2347   ALT 8 (L) 02/03/2017 2347   ALKPHOS 53 02/03/2017 2347   BILITOT 0.9 02/03/2017 2347   GFRNONAA >60 02/06/2017 1145   GFRAA >60 02/06/2017 1145   Lipase     Component Value Date/Time   LIPASE 36 01/31/2017 1655    Studies/Results: Dg Chest 2 View  Result Date:  02/06/2017 CLINICAL DATA:  Patient with right-sided pain. EXAM: CHEST  2 VIEW COMPARISON:  Chest radiograph 02/01/2017 FINDINGS: Monitoring leads overlie the patient. Stable cardiomegaly. Low lung volumes. Minimal basilar heterogeneous opacities. Small left pleural effusion. Thoracic spinal hardware. Kyphoplasty material within multiple thoracic spine vertebral bodies. IMPRESSION: Small left pleural effusion. Left-greater-than-right basilar atelectasis. Electronically Signed   By: Lovey Newcomer M.D.   On: 02/06/2017 10:44    Anti-infectives: Anti-infectives    Start     Dose/Rate Route Frequency Ordered Stop   02/01/17 1830  cefoTEtan (CEFOTAN) 2 g in dextrose 5 % 50 mL IVPB  Status:  Discontinued     2 g 100 mL/hr over 30 Minutes Intravenous  Once 02/01/17 1817 02/01/17 1907   02/01/17 0300  piperacillin-tazobactam (ZOSYN) IVPB 3.375 g     3.375 g 12.5 mL/hr over 240 Minutes Intravenous Every 8 hours 01/31/17 2154     01/31/17 2115  piperacillin-tazobactam (ZOSYN) IVPB 3.375 g     3.375 g 100 mL/hr over 30 Minutes Intravenous  Once 01/31/17 2109 01/31/17 2153       Assessment/Plan Contained perforated duodenal ulcer S/p diagnostic laparoscopy with drain placement - POD#6 - drain with 65 cc serosanguinous output over last 24 hrs - d/c prior to discharge - OOB as tolerated - continue PPI  VT with ambulation  Mild volume overload - cards following, appreciate input - increased metoprolol - echo today  - EKG with tachycardia - IS, continue lasix   Asthma - wean O2 as tolerated, continue nebs per RT HTN High Cholesterol Chronic Constipation  Obesity Chronic back pain Hx of Lymphoma Stroke - 2012 Hx of SVT  FEN - SOFT diet VTE - SCDs, lovenox ID - IV zosyn (7/2>>)  Plan: Continue soft diet. Continue abx and PPI. Echo today. Hopefully home in the next 1-2 days pending cardiac workup.   LOS: 7 days    [name] , [facility] Surgery 02/07/2017, 8:19  AM Pager: [phone] Consults: [phone] Mon-Fri 7:00 am-4:30 pm Sat-Sun 7:00 am-11:30 am

## 2017-02-07 NOTE — Care Management Important Message (Signed)
Important Message  Patient Details  Name: [name] MRN: [id number] Date of Birth: [birth year]   Medicare Important Message Given:  Yes    Erenest Rasher, RN 02/07/2017, 11:08 AM

## 2017-02-07 NOTE — Progress Notes (Signed)
Called to room by patient's daughter. Pt complaining of SOB. Daughter explained that she was helping her mother change the bed pad when her mother got very winded and short of breath. Pt tachypneic and wheezing. Positioned patient in a high Fowlers position;applied O2 3liters [place]. Administered prn Xopenex breathing treatment and 0.5mg  IV Dilaudid. After breathing treatment, pt stated that she felt better. Respirations down from 28 to 22; O2 sat 94% on 2 L [place]. Pt asked to reapply her CPAP. Oxygen sat 94% on CPAP. Pt resting comfortably at this time.

## 2017-02-07 NOTE — Progress Notes (Signed)
SATURATION QUALIFICATIONS: (This note is used to comply with regulatory documentation for home oxygen)  Patient Saturations on Room Air at Rest = 98%  Patient Saturations on Room Air while Ambulating = 95%  Patient Saturations on 0 Liters of oxygen while Ambulating = 95%  Please briefly explain why patient needs home oxygen: 

## 2017-02-07 NOTE — Progress Notes (Signed)
  Echocardiogram 2D Echocardiogram has been performed.  [name] 02/07/2017, 5:19 PM

## 2017-02-07 NOTE — Progress Notes (Signed)
Pt places self on/off cpap. RT will monitor. 

## 2017-02-08 ENCOUNTER — Inpatient Hospital Stay (HOSPITAL_COMMUNITY): Payer: Medicare Other

## 2017-02-08 LAB — BASIC METABOLIC PANEL
ANION GAP: 7 (ref 5–15)
BUN: 8 mg/dL (ref 6–20)
CALCIUM: 8.1 mg/dL — AB (ref 8.9–10.3)
CO2: 30 mmol/L (ref 22–32)
Chloride: 98 mmol/L — ABNORMAL LOW (ref 101–111)
Creatinine, Ser: 0.83 mg/dL (ref 0.44–1.00)
Glucose, Bld: 98 mg/dL (ref 65–99)
POTASSIUM: 3.2 mmol/L — AB (ref 3.5–5.1)
SODIUM: 135 mmol/L (ref 135–145)

## 2017-02-08 LAB — CBC
HEMATOCRIT: 31.7 % — AB (ref 36.0–46.0)
HEMOGLOBIN: 9.8 g/dL — AB (ref 12.0–15.0)
MCH: 28.7 pg (ref 26.0–34.0)
MCHC: 30.9 g/dL (ref 30.0–36.0)
MCV: 93 fL (ref 78.0–100.0)
Platelets: 348 10*3/uL (ref 150–400)
RBC: 3.41 MIL/uL — AB (ref 3.87–5.11)
RDW: 13.8 % (ref 11.5–15.5)
WBC: 8.6 10*3/uL (ref 4.0–10.5)

## 2017-02-08 MED ORDER — POTASSIUM CHLORIDE CRYS ER 20 MEQ PO TBCR
40.0000 meq | EXTENDED_RELEASE_TABLET | Freq: Once | ORAL | Status: AC
  Administered 2017-02-08: 40 meq via ORAL
  Filled 2017-02-08: qty 2

## 2017-02-08 MED ORDER — AEROCHAMBER PLUS FLO-VU MEDIUM MISC: 1.0000 | Freq: Once | Status: DC

## 2017-02-08 MED ORDER — LORATADINE 10 MG PO TABS
10.0000 mg | ORAL_TABLET | Freq: Every day | ORAL | Status: DC
  Administered 2017-02-08 – 2017-02-10 (×3): 10 mg via ORAL
  Filled 2017-02-08 (×3): qty 1

## 2017-02-08 MED ORDER — ALBUTEROL SULFATE (2.5 MG/3ML) 0.083% IN NEBU
2.5000 mg | INHALATION_SOLUTION | Freq: Once | RESPIRATORY_TRACT | Status: DC
  Filled 2017-02-08: qty 3

## 2017-02-08 MED ORDER — ALBUTEROL SULFATE (2.5 MG/3ML) 0.083% IN NEBU: 2.5000 mg | INHALATION_SOLUTION | Freq: Four times a day (QID) | RESPIRATORY_TRACT | Status: DC | PRN

## 2017-02-08 MED ORDER — ALBUTEROL SULFATE (2.5 MG/3ML) 0.083% IN NEBU: 2.5000 mg | INHALATION_SOLUTION | Freq: Two times a day (BID) | RESPIRATORY_TRACT | Status: DC

## 2017-02-08 MED ORDER — ALBUTEROL SULFATE HFA 108 (90 BASE) MCG/ACT IN AERS
1.0000 | INHALATION_SPRAY | Freq: Four times a day (QID) | RESPIRATORY_TRACT | Status: DC | PRN
  Filled 2017-02-08: qty 6.7

## 2017-02-08 MED ORDER — FUROSEMIDE 10 MG/ML IJ SOLN
40.0000 mg | Freq: Once | INTRAMUSCULAR | Status: AC
  Administered 2017-02-08: 40 mg via INTRAVENOUS
  Filled 2017-02-08: qty 4

## 2017-02-08 NOTE — Progress Notes (Signed)
Occupational Therapy Treatment Patient Details Name: [name] MRN: [id number] DOB: 1941/02/10 Today's Date: 02/08/2017    History of present illness Pt is a 76 yo female admitted through ED on 01/31/17 with nausea and vomiting x 2 days. Pt was diagnosed with a perforated duodenal ulcer and underwent an laparscopic procedure with a drain placement. PMH significant for anxiety, asthma, back pain, HTN, lymphoma, SVT.    OT comments  Provided pt with education and handout on energy conservation. Pt and daughter verbalized understand. Educated pt on LB dressing with AE; pt reports she learned compensatory techniques after her back surgery and owns AE. Will continue to follow acutely to increase pt activity tolerance, safety, and independence with ADLs and functional mobility.   Follow Up Recommendations  No OT follow up;Supervision/Assistance - 24 hour    Equipment Recommendations  None recommended by OT    Recommendations for Other Services PT consult    Precautions / Restrictions Precautions Precautions: Fall Precaution Comments: SOB Restrictions Weight Bearing Restrictions: No       Mobility Bed Mobility               General bed mobility comments: In recliner upon arrival  Transfers                 General transfer comment: Declined mobility at session due to fatigue    Balance Overall balance assessment: Needs assistance Sitting-balance support: No upper extremity supported;Feet supported Sitting balance-Leahy Scale: Good     Standing balance support: Bilateral upper extremity supported;During functional activity Standing balance-Leahy Scale: Poor Standing balance comment: Reliant on Bil UE support with RW in standing                           ADL either performed or assessed with clinical judgement   ADL Overall ADL's : Needs assistance/impaired                       Lower Body Dressing Details (indicate cue type and reason):  educated pt on using AE for LB dressing. Pt verbalized understanding.                General ADL Comments: Pt declined to perform ADLs at this time. Reviewed EC techniques with pt and daughter. Also reviewed LB dressing with AE to optimize pt safety and independence. Pt verbalizing understanding of LB dressing with AE and reports she used the same techniques after back surgery. Discussed techniques to use sock aide to assist with donning compression socks.      Vision       Perception     Praxis      Cognition Arousal/Alertness: Awake/alert Behavior During Therapy: WFL for tasks assessed/performed Overall Cognitive Status: Within Functional Limits for tasks assessed                                          Exercises     Shoulder Instructions       General Comments  Daughter present throughout session. Provided education and handout on EC. pt and daughter verbalized understanding.    Pertinent Vitals/ Pain       Pain Assessment: Faces Faces Pain Scale: Hurts a little bit Pain Location: chest and abdomen Pain Descriptors / Indicators: Tightness;Grimacing Pain Intervention(s): Monitored during session;Limited activity within patient's tolerance  Home  Living                                          Prior Functioning/Environment              Frequency  Min 2X/week        Progress Toward Goals  OT Goals(current goals can now be found in the care plan section)  Progress towards OT goals: Progressing toward goals  Acute Rehab OT Goals Patient Stated Goal: to get home soon OT Goal Formulation: With patient Time For Goal Achievement: 02/20/17 Potential to Achieve Goals: Good ADL Goals Pt Will Perform Grooming: with set-up;with supervision;standing Pt Will Perform Upper Body Dressing: with modified independence;sitting Pt Will Perform Lower Body Dressing: with modified independence;with adaptive equipment;sit to/from  stand Pt Will Transfer to Toilet: with modified independence;regular height toilet;ambulating Additional ADL Goal #1: Pt will verbalize three energy conservation techniques with Min VCs  Plan Discharge plan remains appropriate    Co-evaluation                 AM-PAC PT "6 Clicks" Daily Activity     Outcome Measure   Help from another person eating meals?: None Help from another person taking care of personal grooming?: A Little Help from another person toileting, which includes using toliet, bedpan, or urinal?: A Little Help from another person bathing (including washing, rinsing, drying)?: A Little Help from another person to put on and taking off regular upper body clothing?: None Help from another person to put on and taking off regular lower body clothing?: A Little 6 Click Score: 20    End of Session    OT Visit Diagnosis: Unsteadiness on feet (R26.81);Muscle weakness (generalized) (M62.81)   Activity Tolerance Patient tolerated treatment well;Patient limited by fatigue   Patient Left in chair;with call bell/phone within reach;with family/visitor present   Nurse Communication Mobility status;Other (comment) (External cath full)        Time: 1138-1150 OT Time Calculation (min): 12 min  Charges: OT General Charges $OT Visit: 1 Procedure OT Treatments $Self Care/Home Management : 8-22 mins  Yuridia Couts MSOT, OTR/L Acute Rehab Pager: [phone] Office: [place] 02/08/2017, 1:49 PM

## 2017-02-08 NOTE — Progress Notes (Signed)
Progress Note  Patient Name: [name] Date of Encounter: 02/08/2017  Primary Cardiologist: Dr. Doreatha Lew remotely, seen by Dr. Curt Bears   Subjective   C/o clear sputum cough this AM, less wheezing. No palpitations or CP.   Inpatient Medications    Scheduled Meds: . albuterol  2.5 mg Nebulization Once  . budesonide  0.25 mg Nebulization BID  . DULoxetine  60 mg Oral Daily  . enoxaparin (LOVENOX) injection  40 mg Subcutaneous Q24H  . furosemide  40 mg Oral Daily  . ipratropium  0.5 mg Nebulization TID  . levalbuterol  0.63 mg Nebulization TID  . loratadine  10 mg Oral Daily  . metoprolol tartrate  75 mg Oral BID  . montelukast  10 mg Oral Daily  . pantoprazole  40 mg Oral BID   Continuous Infusions: . sodium chloride Stopped (02/02/17 2000)  . dextrose 5 % and 0.9 % NaCl with KCl 20 mEq/L 30 mL/hr at 02/06/17 2229  . phenylephrine (NEO-SYNEPHRINE) Adult infusion Stopped (02/02/17 2000)  . piperacillin-tazobactam (ZOSYN)  IV Stopped (02/08/17 0750)   PRN Meds: acetaminophen **OR** acetaminophen, HYDROmorphone (DILAUDID) injection, ipratropium, levalbuterol, ondansetron (ZOFRAN) IV   Vital Signs    Vitals:   02/07/17 1900 02/07/17 2032 02/08/17 0453 02/08/17 0836  BP:  (!) 150/55 (!) 137/56   Pulse:  79 70   Resp:  15 14   Temp:  98.9 F (37.2 C) 98.7 F (37.1 C)   TempSrc:  Oral Oral   SpO2: 100% 98% 95% 96%  Weight:      Height:        Intake/Output Summary (Last 24 hours) at 02/08/17 0923 Last data filed at 02/08/17 0454  Gross per 24 hour  Intake              600 ml  Output              735 ml  Net             -135 ml   Filed Weights   01/31/17 1659 02/01/17 0049 02/01/17 1800  Weight: 250 lb (113.4 kg) 261 lb 7.5 oz (118.6 kg) 274 lb 0.5 oz (124.3 kg)    Telemetry    NSR - Personally Reviewed  Physical Exam   GEN: No acute distress.  HEENT: Normocephalic, atraumatic, sclera non-icteric. Neck: No JVD or bruits. Cardiac: RRR no murmurs, rubs,  or gallops.  Radials/DP/PT 1+ and equal bilaterally.  Respiratory: Breathing unlabored but rhonchi in right lung, faint exp wheeze left lung GI: Soft, nontender, non-distended, BS +x 4. MS: no deformity. Extremities: No clubbing or cyanosis. Trace sockline edema superimposed on large leg habitus.  Distal pedal pulses are 2+ and equal bilaterally. Neuro:  AAOx3. Follows commands. Psych:  Responds to questions appropriately with a normal affect.  Labs    Chemistry Recent Labs Lab 02/03/17 2347 02/06/17 1145 02/08/17 0545  NA 139 138 135  K 3.9 3.7 3.2*  CL 106 102 98*  CO2 28 27 30   GLUCOSE 149* 128* 98  BUN 6 5* 8  CREATININE 0.72 0.77 0.83  CALCIUM 8.4* 8.6* 8.1*  PROT 5.5*  --   --   ALBUMIN 2.5*  --   --   AST 13*  --   --   ALT 8*  --   --   ALKPHOS 53  --   --   BILITOT 0.9  --   --   GFRNONAA >60 >60 >60  GFRAA >60 >60 >  60  ANIONGAP 5 9 7      Hematology Recent Labs Lab 02/03/17 0636 02/03/17 2347 02/08/17 0545  WBC 8.0 7.7 8.6  RBC 3.48* 3.34* 3.41*  HGB 10.1* 9.8* 9.8*  HCT 33.1* 32.0* 31.7*  MCV 95.1 95.8 93.0  MCH 29.0 29.3 28.7  MCHC 30.5 30.6 30.9  RDW 13.6 13.9 13.8  PLT 280 302 348    Radiology    Dg Chest 2 View  Result Date: 02/06/2017 CLINICAL DATA:  Patient with right-sided pain. EXAM: CHEST  2 VIEW COMPARISON:  Chest radiograph 02/01/2017 FINDINGS: Monitoring leads overlie the patient. Stable cardiomegaly. Low lung volumes. Minimal basilar heterogeneous opacities. Small left pleural effusion. Thoracic spinal hardware. Kyphoplasty material within multiple thoracic spine vertebral bodies. IMPRESSION: Small left pleural effusion. Left-greater-than-right basilar atelectasis. Electronically Signed   By: Lovey Newcomer M.D.   On: 02/06/2017 10:44    Cardiac Studies   2D echo 02/07/17  - Left ventricle: The cavity size was normal. Wall thickness was   normal. Systolic function was normal. The estimated ejection   fraction was in the range of 60%  to 65%. Wall motion was normal;   there were no regional wall motion abnormalities. Doppler   parameters are consistent with abnormal left ventricular   relaxation (grade 1 diastolic dysfunction). - Mitral valve: Calcified annulus. There was mild regurgitation. - Pulmonary arteries: Systolic pressure was mildly to moderately   increased. PA peak pressure: 48 mm Hg (S). Impressions: - Normal LV systolic function; mild diastolic dysfunction; mild MR;   trace TR with mild to moderate elevation in pulmonary pressure.  Patient Profile     76 y.o. female with hx of lymphoma in remission, SVT vs afib 2009, HTN, HLD, obesity, mild pulm HTN, asthma who presented to [facility] 01/31/17 with n/v, workup revealed perforated duodenal ulcer s/p laparoscopy and drain placement 02/01/17. Cardiology following for tachycardia and new LBBB.  Assessment & Plan    1. Perforated duodenal ulcer - per surgery.  2. Tachycardia - telemetry reviewed from original events, agreed this is very difficult to discern. Brief irregularity noted but mostly with P waves, then a faster sustained arrhythmia around 180-200bpm suspicious for SVT (looks like it could be VT but has similar axis to NSR). Do not see any sustained episodes of atrial fib. Regardless in the setting of recent perforated duodenal ulcer and anemia, would not be ideal candidate for anticoagulation if this was seen. Continue metoprolol 52m BID and further surveillance. 2D echo reassuring with normal LVEF, mild diastolic dysfunction, mild MR, mild-mod elevation in pulm pressures similar to prior.  3. Cough - CXR yesterday showed small left pleural effusion, but today she has what sounds like rhonchi on the right and clear sputum cough. Further management per primary team. Will review with MD whether a trial dose of Lasix would be warranted given IV fluids this admission and diastolic dysfunction. Will cap her IV fluids now that she is eating a diet and drinking.  4.  Hypokalemia - K 3.8 yesterday ->21m KCl -> 3.2. Will give 405mKCl x 2. Likely needs standing potassium upon dc.  4. HTN - BP controlled.  Signed, DayCharlie PitterA-C  02/08/2017, 9:23 AM    Personally seen and examined. Agree with above.  76 14ar old with perforated duodenum, PSVT, asthma Exam: Positive bowel sounds, lungs with diffuse wheezes, rhonchi, sitting in chair, appears comfortable. Minor lower extremity edema. Heart regular rate and rhythm. 1/6 systolic murmur  Paroxysmal supraventricular tachycardia  - This has  improved since increase of metoprolol to 75 mg twice a day. Continue. Telemetry personally reviewed.  - If atrial fibrillation returns, one would consider chronic anticoagulation.  Asthma/cough/wheeze  - Fairly persistent since I met her yesterday.  - KVO IV fluids  - We will give her 40 mg of IV Lasix in addition to her 40 mg of by mouth Lasix daily.  - As an outpatient, her prior doctor has placed her on daily Lasix because of lower extremity edema which led to weeping of her lower extremities.  She seems to be getting close to discharge.  Candee Furbish, MD

## 2017-02-08 NOTE — Progress Notes (Signed)
[facility]/Trauma Progress Note  7 Days Post-Op   Subjective:  CC: 4/10 back pain, 2/10 abdominal pain  Pt wheezing this am. Neb helped but still had to sit down twice when walking the halls. Pt usually takes prednisone when her asthma gets this bad. Pt has not taken in a while. Pt does not take NSAIDS. No hx of GERD or acid reflux. Pt never had an ulcer before and not sure why she has one now.  Had a BM yesterday, nonbloody. No vomiting. Tolerating diet.   Objective: Vital signs in last 24 hours: Temp:  [98.7 F (37.1 C)-99.2 F (37.3 C)] 98.7 F (37.1 C) (07/10 0453) Pulse Rate:  [64-79] 70 (07/10 0453) Resp:  [14-17] 14 (07/10 0453) BP: (123-150)/(55-71) 137/56 (07/10 0453) SpO2:  [95 %-100 %] 96 % (07/10 0836) Last BM Date: 02/06/17  Intake/Output from previous day: 07/09 0701 - 07/10 0700 In: 600 [P.O.:600] Out: 735 [Urine:700; Drains:35] Intake/Output this shift: Total I/O In: 240 [P.O.:240] Out: -   PE: Gen:  Alert, NAD, pleasant, cooperative, well appearing Card:  RRR, no M/G/R heard, 2 + radial pulses bilaterally Pulm:  scattered expiratory wheezes heard throughout, rate and effort normal, no accessory muscle usage Abd: Soft, not distended, +BS, incisions C/D/I, drain with minimal serous drainage, very mild generalized TTP Skin: no rashes noted, warm and dry  Lab Results:   Recent Labs  02/08/17 0545  WBC 8.6  HGB 9.8*  HCT 31.7*  PLT 348   BMET  Recent Labs  02/06/17 1145 02/08/17 0545  NA 138 135  K 3.7 3.2*  CL 102 98*  CO2 27 30  GLUCOSE 128* 98  BUN 5* 8  CREATININE 0.77 0.83  CALCIUM 8.6* 8.1*   PT/INR No results for input(s): LABPROT, INR in the last 72 hours. CMP     Component Value Date/Time   NA 135 02/08/2017 0545   K 3.2 (L) 02/08/2017 0545   CL 98 (L) 02/08/2017 0545   CO2 30 02/08/2017 0545   GLUCOSE 98 02/08/2017 0545   BUN 8 02/08/2017 0545   CREATININE 0.83 02/08/2017 0545   CALCIUM 8.1 (L)  02/08/2017 0545   PROT 5.5 (L) 02/03/2017 2347   ALBUMIN 2.5 (L) 02/03/2017 2347   AST 13 (L) 02/03/2017 2347   ALT 8 (L) 02/03/2017 2347   ALKPHOS 53 02/03/2017 2347   BILITOT 0.9 02/03/2017 2347   GFRNONAA >60 02/08/2017 0545   GFRAA >60 02/08/2017 0545   Lipase     Component Value Date/Time   LIPASE 36 01/31/2017 1655    Studies/Results: No results found.  Anti-infectives: Anti-infectives    Start     Dose/Rate Route Frequency Ordered Stop   02/01/17 1830  cefoTEtan (CEFOTAN) 2 g in dextrose 5 % 50 mL IVPB  Status:  Discontinued     2 g 100 mL/hr over 30 Minutes Intravenous  Once 02/01/17 1817 02/01/17 1907   02/01/17 0300  piperacillin-tazobactam (ZOSYN) IVPB 3.375 g     3.375 g 12.5 mL/hr over 240 Minutes Intravenous Every 8 hours 01/31/17 2154     01/31/17 2115  piperacillin-tazobactam (ZOSYN) IVPB 3.375 g     3.375 g 100 mL/hr over 30 Minutes Intravenous  Once 01/31/17 2109 01/31/17 2153       Assessment/Plan Contained perforated duodenal ulcer S/p diagnostic laparoscopy with drain placement, 02/01/17, Dr. Brantley Stage - POD#7 - drain with 35 cc serosanguinous output over last 24 hrs - d/c prior to discharge - OOB as tolerated -  continue PPI - output f/u with GI after CCS f/u   VT with ambulation  Mild volume overload - cards following, appreciate input - 75mg  BID metoprolol - ECHO: Normal LV systolic function; mild diastolic dysfunction; mild MR; trace TR with mild to moderate elevation in pulmonary pressure - EKG with tachycardia now resolved - IS, continue lasix  Asthma - wean O2 as tolerated, continue nebs per RT, may need to get medicine involved if exacerbation does not improve, avoid steroids in setting of GI ulcer, careful with albuterol in setting of SVT HTN High Cholesterol Chronic Constipation  Obesity Chronic back pain Hx of Lymphoma Stroke - 2012 Hx of SVT  FEN- SOFT diet VTE- SCDs, lovenox ID- IV zosyn (7/2>>)  Plan: Continue  soft diet. Continue abx and PPI. HHPT is recommended. Hopefully home tomorrow if asthma improves. Added claritin   LOS: 8 days    Kalman Drape , [facility] Surgery 02/08/2017, 10:48 AM Pager: [phone] Consults: [phone] Mon-Fri 7:00 am-4:30 pm Sat-Sun 7:00 am-11:30 am

## 2017-02-08 NOTE — Progress Notes (Signed)
Patient places self on and off cpap. RT will monitor as needed.

## 2017-02-09 LAB — BASIC METABOLIC PANEL
Anion gap: 9 (ref 5–15)
BUN: 11 mg/dL (ref 6–20)
CALCIUM: 8.2 mg/dL — AB (ref 8.9–10.3)
CO2: 30 mmol/L (ref 22–32)
Chloride: 98 mmol/L — ABNORMAL LOW (ref 101–111)
Creatinine, Ser: 0.94 mg/dL (ref 0.44–1.00)
GFR calc Af Amer: >60 mL/min (ref >60)
GFR, EST NON AFRICAN AMERICAN: 57 mL/min — AB (ref >60)
Glucose, Bld: 104 mg/dL — ABNORMAL HIGH (ref 65–99)
POTASSIUM: 3.5 mmol/L (ref 3.5–5.1)
Sodium: 137 mmol/L (ref 135–145)

## 2017-02-09 LAB — CBC
HCT: 33.5 % — ABNORMAL LOW (ref 36.0–46.0)
HEMOGLOBIN: 10.3 g/dL — AB (ref 12.0–15.0)
MCH: 28.7 pg (ref 26.0–34.0)
MCHC: 30.7 g/dL (ref 30.0–36.0)
MCV: 93.3 fL (ref 78.0–100.0)
Platelets: 370 10*3/uL (ref 150–400)
RBC: 3.59 MIL/uL — ABNORMAL LOW (ref 3.87–5.11)
RDW: 13.9 % (ref 11.5–15.5)
WBC: 8.2 10*3/uL (ref 4.0–10.5)

## 2017-02-09 LAB — MAGNESIUM: Magnesium: 1.8 mg/dL (ref 1.7–2.4)

## 2017-02-09 MED ORDER — FUROSEMIDE 10 MG/ML IJ SOLN
40.0000 mg | Freq: Once | INTRAMUSCULAR | Status: AC
  Administered 2017-02-09: 40 mg via INTRAVENOUS
  Filled 2017-02-09: qty 4

## 2017-02-09 MED ORDER — POTASSIUM CHLORIDE CRYS ER 20 MEQ PO TBCR
40.0000 meq | EXTENDED_RELEASE_TABLET | Freq: Once | ORAL | Status: AC
  Administered 2017-02-09: 40 meq via ORAL
  Filled 2017-02-09: qty 2

## 2017-02-09 NOTE — Discharge Instructions (Signed)
Peptic Ulcer °A peptic ulcer is a painful sore in the lining of your esophagus, stomach, or the first part of your small intestine. You may have pain in the area between your chest and your belly button. The most common causes of an ulcer are: °· An infection. °· Using certain pain medicines too often or too much. ° °Follow these instructions at home: °· Avoid alcohol. °· Avoid caffeine. °· Do not use any tobacco products. These include cigarettes, chewing tobacco, and e-cigarettes. If you need help quitting, ask your doctor. °· Take over-the-counter and prescription medicines only as told by your doctor. Do not stop or change your medicines unless you talk with your doctor about it first. °· Keep all follow-up visits as told by your doctor. This is important. °Contact a doctor if: °· You do not get better in 7 days after you start treatment. °· You keep having an upset stomach (indigestion) or heartburn. °Get help right away if: °· You have sudden, sharp pain in your belly (abdomen). °· You have lasting belly pain. °· You have bloody poop (stool) or black, tarry poop. °· You throw up (vomit) blood. It may look like coffee grounds. °· You feel light-headed or feel like you may pass out (faint). °· You get weak. °· You get sweaty or feel sticky and cold to the touch (clammy). °This information is not intended to replace advice given to you by your health care provider. Make sure you discuss any questions you have with your health care provider. °Document Released: 10/13/2009 Document Revised: 12/03/2015 Document Reviewed: 04/19/2015 °Elsevier Interactive Patient Education © 2018 [employer] ° °

## 2017-02-09 NOTE — Progress Notes (Signed)
[facility] Progress Note  8 Days Post-Op  Subjective: CC: Feeling much better Patient feels like breathing is better today. Denies abdominal pain, nausea, vomiting. Tolerating diet.  Discussed follow up plans. Discussed care for drain site after removed.   Objective: Vital signs in last 24 hours: Temp:  [98.2 F (36.8 C)-98.8 F (37.1 C)] 98.6 F (37 C) (07/11 0452) Pulse Rate:  [60-75] 60 (07/11 0452) Resp:  [16] 16 (07/11 0452) BP: (127-136)/(51-63) 136/54 (07/11 0452) SpO2:  [94 %-97 %] 97 % (07/11 0801) Weight:  [129.2 kg (284 lb 13.4 oz)] 129.2 kg (284 lb 13.4 oz) (07/11 0500) Last BM Date: 02/07/17  Intake/Output from previous day: 07/10 0701 - 07/11 0700 In: 1040 [P.O.:1040] Out: 3760 [Urine:3700; Drains:60] Intake/Output this shift: No intake/output data recorded.  PE: Gen:  Alert, NAD, pleasant Card:  Regular rate and rhythm, no M/G/R Pulm:  Normal effort, slight expiratory wheezes bilaterally, improved from yesterday Abd: Soft, non-tender, non-distended, bowel sounds present in all 4 quadrants, incisions C/D/I Skin: warm and dry, no rashes  Psych: A&Ox3   Lab Results:   Recent Labs  02/08/17 0545 02/09/17 0259  WBC 8.6 8.2  HGB 9.8* 10.3*  HCT 31.7* 33.5*  PLT 348 370   BMET  Recent Labs  02/08/17 0545 02/09/17 0259  NA 135 137  K 3.2* 3.5  CL 98* 98*  CO2 30 30  GLUCOSE 98 104*  BUN 8 11  CREATININE 0.83 0.94  CALCIUM 8.1* 8.2*   CMP     Component Value Date/Time   NA 137 02/09/2017 0259   K 3.5 02/09/2017 0259   CL 98 (L) 02/09/2017 0259   CO2 30 02/09/2017 0259   GLUCOSE 104 (H) 02/09/2017 0259   BUN 11 02/09/2017 0259   CREATININE 0.94 02/09/2017 0259   CALCIUM 8.2 (L) 02/09/2017 0259   PROT 5.5 (L) 02/03/2017 2347   ALBUMIN 2.5 (L) 02/03/2017 2347   AST 13 (L) 02/03/2017 2347   ALT 8 (L) 02/03/2017 2347   ALKPHOS 53 02/03/2017 2347   BILITOT 0.9 02/03/2017 2347   GFRNONAA 57 (L) 02/09/2017 0259   GFRAA >60  02/09/2017 0259   Lipase     Component Value Date/Time   LIPASE 36 01/31/2017 1655       Studies/Results: Dg Chest Port 1 View  Result Date: 02/08/2017 CLINICAL DATA:  76 year old female with pleural effusions and shortness breath. Subsequent encounter. EXAM: PORTABLE CHEST 1 VIEW COMPARISON:  02/06/2017. FINDINGS: Extensive fusion thoracic spine extending into lumbar spine. Inferior aspect not imaged. Right central line may be in place versus overlying lead. Cardiomegaly. Pulmonary vascular congestion. Left base atelectasis/pleural effusion. Slightly tortuous aorta. IMPRESSION: Overall no significant change. Cardiomegaly with pulmonary vascular congestion, left-sided small pleural effusion and basilar atelectasis. Electronically Signed   By: Genia Del M.D.   On: 02/08/2017 19:20    Anti-infectives: Anti-infectives    Start     Dose/Rate Route Frequency Ordered Stop   02/01/17 1830  cefoTEtan (CEFOTAN) 2 g in dextrose 5 % 50 mL IVPB  Status:  Discontinued     2 g 100 mL/hr over 30 Minutes Intravenous  Once 02/01/17 1817 02/01/17 1907   02/01/17 0300  piperacillin-tazobactam (ZOSYN) IVPB 3.375 g     3.375 g 12.5 mL/hr over 240 Minutes Intravenous Every 8 hours 01/31/17 2154     01/31/17 2115  piperacillin-tazobactam (ZOSYN) IVPB 3.375 g     3.375 g 100 mL/hr over 30 Minutes Intravenous  Once 01/31/17 2109  01/31/17 2153       Assessment/Plan Contained perforated duodenal ulcer S/p diagnostic laparoscopy with drain placement, 02/01/17, Dr. Brantley Stage - POD#8 - discontinue drain  - OOB as tolerated - continue PPI - output f/u with GI after CCS f/u   VT with ambulation  Mild volume overload - cards following, appreciate input - 75mg  BID metoprolol - ECHO: Normal LV systolic function; mild diastolic dysfunction; mild MR; trace TR with mild to moderate elevation in pulmonary pressure - EKG with tachycardia now resolved - IS, continue lasix  Asthma - patient improved  from yesterday - avoid steroids in setting of GI ulcer, careful with albuterol in setting of SVT - claritin added yesterday  HTN High Cholesterol Chronic Constipation  Obesity Chronic back pain Hx of Lymphoma Stroke - 2012 Hx of SVT  FEN- SOFT diet VTE- SCDs, lovenox ID- IV zosyn (7/2>>)  Plan: Home today on PPI. HHPT is recommended. Added claritin yesterday, suggested to patient that she continue this at home. F/U for SVT per cardiology. Patient will f/u in our office in 2-3 weeks and then will need GI F/U.   LOS: 9 days    Brigid Re , [facility] Surgery 02/09/2017, 8:37 AM Pager: [phone] Consults: [phone] Mon-Fri 7:00 am-4:30 pm Sat-Sun 7:00 am-11:30 am

## 2017-02-09 NOTE — Progress Notes (Signed)
Progress Note  Patient Name: [name] Date of Encounter: 02/09/2017  Primary Cardiologist: Dr. Doreatha Lew remotely, seen by Dr. Curt Bears   Subjective   Feels much better, in terms of breathing, compared to yesterday. She just finished a breathing treatment. No supplemental O2 requirements. She denies CP.    Inpatient Medications    Scheduled Meds: . albuterol  2.5 mg Nebulization Once  . budesonide  0.25 mg Nebulization BID  . DULoxetine  60 mg Oral Daily  . enoxaparin (LOVENOX) injection  40 mg Subcutaneous Q24H  . furosemide  40 mg Oral Daily  . ipratropium  0.5 mg Nebulization TID  . levalbuterol  0.63 mg Nebulization TID  . loratadine  10 mg Oral Daily  . metoprolol tartrate  75 mg Oral BID  . montelukast  10 mg Oral Daily  . pantoprazole  40 mg Oral BID   Continuous Infusions: . sodium chloride Stopped (02/02/17 2000)  . phenylephrine (NEO-SYNEPHRINE) Adult infusion Stopped (02/02/17 2000)  . piperacillin-tazobactam (ZOSYN)  IV 3.375 g (02/09/17 0253)   PRN Meds: acetaminophen **OR** acetaminophen, albuterol, HYDROmorphone (DILAUDID) injection, ipratropium, ondansetron (ZOFRAN) IV   Vital Signs    Vitals:   02/09/17 0452 02/09/17 0500 02/09/17 0755 02/09/17 0801  BP: (!) 136/54     Pulse: 60     Resp: 16     Temp: 98.6 F (37 C)     TempSrc: Oral     SpO2: 95%  97% 97%  Weight:  284 lb 13.4 oz (129.2 kg)    Height:        Intake/Output Summary (Last 24 hours) at 02/09/17 0827 Last data filed at 02/09/17 0456  Gross per 24 hour  Intake             1040 ml  Output             3760 ml  Net            -2720 ml   Filed Weights   02/01/17 0049 02/01/17 1800 02/09/17 0500  Weight: 261 lb 7.5 oz (118.6 kg) 274 lb 0.5 oz (124.3 kg) 284 lb 13.4 oz (129.2 kg)    Telemetry    NSR - Personally Reviewed  ECG    NSR - Personally Reviewed  Physical Exam   GEN: No acute distress.  Moderately obese  Neck: No JVD Cardiac: RRR, no murmurs, rubs, or  gallops.  Respiratory: bilaterally inspiratory and expiratory rhonchi. No rales.  GI: Soft, nontender, non-distended  MS: No edema; No deformity. Neuro:  Nonfocal  Psych: Normal affect   Labs    Chemistry Recent Labs Lab 02/03/17 2347 02/06/17 1145 02/08/17 0545 02/09/17 0259  NA 139 138 135 137  K 3.9 3.7 3.2* 3.5  CL 106 102 98* 98*  CO2 28 27 30 30   GLUCOSE 149* 128* 98 104*  BUN 6 5* 8 11  CREATININE 0.72 0.77 0.83 0.94  CALCIUM 8.4* 8.6* 8.1* 8.2*  PROT 5.5*  --   --   --   ALBUMIN 2.5*  --   --   --   AST 13*  --   --   --   ALT 8*  --   --   --   ALKPHOS 53  --   --   --   BILITOT 0.9  --   --   --   GFRNONAA >60 >60 >60 57*  GFRAA >60 >60 >60 >60  ANIONGAP 5 9 7  9  Hematology Recent Labs Lab 02/03/17 2347 02/08/17 0545 02/09/17 0259  WBC 7.7 8.6 8.2  RBC 3.34* 3.41* 3.59*  HGB 9.8* 9.8* 10.3*  HCT 32.0* 31.7* 33.5*  MCV 95.8 93.0 93.3  MCH 29.3 28.7 28.7  MCHC 30.6 30.9 30.7  RDW 13.9 13.8 13.9  PLT 302 348 370    Cardiac EnzymesNo results for input(s): TROPONINI in the last 168 hours. No results for input(s): TROPIPOC in the last 168 hours.   BNPNo results for input(s): BNP, PROBNP in the last 168 hours.   DDimer No results for input(s): DDIMER in the last 168 hours.   Radiology    Dg Chest Port 1 View  Result Date: 02/08/2017 CLINICAL DATA:  76 year old female with pleural effusions and shortness breath. Subsequent encounter. EXAM: PORTABLE CHEST 1 VIEW COMPARISON:  02/06/2017. FINDINGS: Extensive fusion thoracic spine extending into lumbar spine. Inferior aspect not imaged. Right central line may be in place versus overlying lead. Cardiomegaly. Pulmonary vascular congestion. Left base atelectasis/pleural effusion. Slightly tortuous aorta. IMPRESSION: Overall no significant change. Cardiomegaly with pulmonary vascular congestion, left-sided small pleural effusion and basilar atelectasis. Electronically Signed   By: Genia Del M.D.   On:  02/08/2017 19:20    Cardiac Studies   2D echo 02/07/17  - Left ventricle: The cavity size was normal. Wall thickness was normal. Systolic function was normal. The estimated ejection fraction was in the range of 60% to 65%. Wall motion was normal; there were no regional wall motion abnormalities. Doppler parameters are consistent with abnormal left ventricular relaxation (grade 1 diastolic dysfunction). - Mitral valve: Calcified annulus. There was mild regurgitation. - Pulmonary arteries: Systolic pressure was mildly to moderately increased. PA peak pressure: 48 mm Hg (S). Impressions: - Normal LV systolic function; mild diastolic dysfunction; mild MR; trace TR with mild to moderate elevation in pulmonary pressure.  Patient Profile     76 y.o. female with hx of lymphoma in remission, SVT vs afib 2009, HTN, HLD, obesity, mild pulm HTN, asthma who presented to [facility]/31/17 with n/v, workup revealed perforated duodenal ulcer s/p laparoscopy and drain placement 02/01/17. Cardiology following for tachycardia and new LBBB.  Assessment & Plan    1. Perforated Duodenal Ulcer: S/p diagnostic laparoscopy with drain placement. Management per general surgery.   2. Tachycardia/ PSVT:  Rate improved with increase in metoprolol to 75 mg. Currently NSR with resting HR in the 80s. She denies any palpitations. BP is stable.  EF normal on echo at 60-65%. Continue BB.   3. HTN: controlled on current regimen.   4. Asthma/ Cough/Wheeze: CXR 02/08/17 showed small left pleural effusion with vascular congestion and basilar atelectasis. IV Lasix given yesterday. Good UOP yesterday, -3L out. Renal function and K stable. Pt notes subjective improvement. She has noticed less wheezing. No supplemental O2 requirements currently. She continues to have inspiratory and expiratory rhonchi on exam, however no rales.  Continue home oral lasix dose, 40 mg daily. Continue inhalers.   Signed, Lyda Jester,  PA-C  02/09/2017, 8:27 AM    Personally seen and examined. Agree with above.  PSVT improved with metoprolol increase Good UOP with IV lasix. Will give again today Lungs with decreased wheeze, RRR, Alert  Likely will be OK with DC from cardiac perspective tomorrow.  Candee Furbish, MD

## 2017-02-09 NOTE — Progress Notes (Signed)
Physical Therapy Treatment Patient Details Name: [name] MRN: [id number] DOB: 1940-10-07 Today's Date: 02/09/2017    History of Present Illness Pt is a 76 yo female admitted through ED on 01/31/17 with nausea and vomiting x 2 days. Pt was diagnosed with a perforated duodenal ulcer and underwent an laparscopic procedure with a drain placement. PMH significant for anxiety, asthma, back pain, HTN, lymphoma, SVT.     PT Comments    Pt declined ambulation due to having constant need to urinate due to Lasix.  Pt did ambulate with mobility tech this AM, prior to receiving meds and she reports pt with less SOB with gait today.  Facilitated seated LE therex program that pt can do at home.  O2 99% on room air.  Follow Up Recommendations  Home health PT;Supervision for mobility/OOB     Equipment Recommendations  None recommended by PT    Recommendations for Other Services       Precautions / Restrictions Precautions Precautions: Fall Precaution Comments: SOB Restrictions Weight Bearing Restrictions: No    Mobility  Bed Mobility Overal bed mobility: Modified Independent Bed Mobility: Supine to Sit     Supine to sit: Modified independent (Device/Increase time);HOB elevated (with rail)     General bed mobility comments: Pt used rail and HOB to come to EOB while PT was getting RW  Transfers Overall transfer level: Needs assistance Equipment used: Rolling walker (2 wheeled) Transfers: Sit to/from [facility] Sit to Stand: Supervision Stand pivot transfers: Min guard       General transfer comment: Wheezing with o2 sat 99% and worked on breathing techniques  Ambulation/Gait             General Gait Details: Pt declined gait due to received LAsix and having constant urination   Stairs            Wheelchair Mobility    Modified Rankin (Stroke Patients Only)       Balance     Sitting balance-Leahy Scale: Good     Standing balance  support: Bilateral upper extremity supported Standing balance-Leahy Scale: Poor Standing balance comment: requires RW                            Cognition Arousal/Alertness: Awake/alert Behavior During Therapy: WFL for tasks assessed/performed Overall Cognitive Status: Within Functional Limits for tasks assessed                                        Exercises General Exercises - Lower Extremity Ankle Circles/Pumps: AROM;Both;10 reps Long Arc Quad: AROM;Both;10 reps;Seated Heel Slides: Left;5 reps;AAROM Hip ABduction/ADduction: AROM;Both;10 reps    General Comments General comments (skin integrity, edema, etc.): Daughter present      Pertinent Vitals/Pain Pain Assessment: No/denies pain    Home Living                      Prior Function            PT Goals (current goals can now be found in the care plan section) Acute Rehab PT Goals Patient Stated Goal: to get home soon PT Goal Formulation: With patient/family Time For Goal Achievement: 02/21/17 Potential to Achieve Goals: Good Progress towards PT goals: Progressing toward goals    Frequency    Min 3X/week      PT Plan  Current plan remains appropriate    Co-evaluation              AM-PAC PT "6 Clicks" Daily Activity  Outcome Measure  Difficulty turning over in bed (including adjusting bedclothes, sheets and blankets)?: A Little Difficulty moving from lying on back to sitting on the side of the bed? : A Little Difficulty sitting down on and standing up from a chair with arms (e.g., wheelchair, bedside commode, etc,.)?: A Little Help needed moving to and from a bed to chair (including a wheelchair)?: A Little Help needed walking in hospital room?: A Little Help needed climbing 3-5 steps with a railing? : A Lot 6 Click Score: 17    End of Session   Activity Tolerance: Other (comment) (Pt limited by urinary uregency due to Lasix) Patient left: in chair;with call  bell/phone within reach;with family/visitor present   PT Visit Diagnosis: Unsteadiness on feet (R26.81);Muscle weakness (generalized) (M62.81);Difficulty in walking, not elsewhere classified (R26.2)     Time: 6389-3734 PT Time Calculation (min) (ACUTE ONLY): 18 min  Charges:  $Therapeutic Exercise: 8-22 mins                    G Codes:       Adin Laker L. Tamala Julian, [place] Pager [phone] 02/09/2017    Galen Manila 02/09/2017, 1:13 PM

## 2017-02-10 MED ORDER — PANTOPRAZOLE SODIUM 40 MG PO TBEC: 40.0000 mg | DELAYED_RELEASE_TABLET | Freq: Two times a day (BID) | ORAL | 2 refills | Status: DC

## 2017-02-10 NOTE — Progress Notes (Signed)
Patient places herself on CPAP when ready. RT will continue to monitor.

## 2017-02-10 NOTE — Progress Notes (Addendum)
Progress Note  Patient Name: [name] Date of Encounter: 02/10/2017  Primary Cardiologist: Dr. Doreatha Lew remotely, seen by Dr. Sonia Side   [facility] hallway, feels better. Mild SOB.     Inpatient Medications    Scheduled Meds: . albuterol  2.5 mg Nebulization Once  . budesonide  0.25 mg Nebulization BID  . DULoxetine  60 mg Oral Daily  . enoxaparin (LOVENOX) injection  40 mg Subcutaneous Q24H  . furosemide  40 mg Oral Daily  . ipratropium  0.5 mg Nebulization TID  . levalbuterol  0.63 mg Nebulization TID  . loratadine  10 mg Oral Daily  . metoprolol tartrate  75 mg Oral BID  . montelukast  10 mg Oral Daily  . pantoprazole  40 mg Oral BID   Continuous Infusions: . sodium chloride Stopped (02/02/17 2000)  . piperacillin-tazobactam (ZOSYN)  IV Stopped (02/10/17 0643)   PRN Meds: acetaminophen **OR** acetaminophen, albuterol, HYDROmorphone (DILAUDID) injection, ipratropium, ondansetron (ZOFRAN) IV   Vital Signs    Vitals:   02/09/17 2122 02/10/17 0510 02/10/17 0800 02/10/17 0804  BP: (!) 140/50 129/60    Pulse: 74 70    Resp: 18 18    Temp: 98.3 F (36.8 C) (!) 97.5 F (36.4 C)    TempSrc: Oral Axillary    SpO2: 95% 95% 97% 97%  Weight:      Height:        Intake/Output Summary (Last 24 hours) at 02/10/17 1019 Last data filed at 02/10/17 0910  Gross per 24 hour  Intake              580 ml  Output             3800 ml  Net            -3220 ml   Filed Weights   02/01/17 0049 02/01/17 1800 02/09/17 0500  Weight: 261 lb 7.5 oz (118.6 kg) 274 lb 0.5 oz (124.3 kg) 284 lb 13.4 oz (129.2 kg)    Telemetry    NSR - Personally Reviewed LBBB  ECG    NSR LBBB- Personally Reviewed  Physical Exam   GEN: No acute distress.  Moderately obese  Neck: No JVD Cardiac: RRR, no murmurs, rubs, or gallops.  Respiratory: bilaterally inspiratory and expiratory rhonchi improved. No rales.  GI: Soft, nontender, non-distended  MS: No edema; No deformity. Neuro:   Nonfocal  Psych: Normal affect   Labs    Chemistry  Recent Labs Lab 02/03/17 2347 02/06/17 1145 02/08/17 0545 02/09/17 0259  NA 139 138 135 137  K 3.9 3.7 3.2* 3.5  CL 106 102 98* 98*  CO2 28 27 30 30   GLUCOSE 149* 128* 98 104*  BUN 6 5* 8 11  CREATININE 0.72 0.77 0.83 0.94  CALCIUM 8.4* 8.6* 8.1* 8.2*  PROT 5.5*  --   --   --   ALBUMIN 2.5*  --   --   --   AST 13*  --   --   --   ALT 8*  --   --   --   ALKPHOS 53  --   --   --   BILITOT 0.9  --   --   --   GFRNONAA >60 >60 >60 57*  GFRAA >60 >60 >60 >60  ANIONGAP 5 9 7 9      Hematology  Recent Labs Lab 02/03/17 2347 02/08/17 0545 02/09/17 0259  WBC 7.7 8.6 8.2  RBC 3.34* 3.41* 3.59*  HGB 9.8* 9.8* 10.3*  HCT 32.0* 31.7* 33.5*  MCV 95.8 93.0 93.3  MCH 29.3 28.7 28.7  MCHC 30.6 30.9 30.7  RDW 13.9 13.8 13.9  PLT 302 348 370    Cardiac EnzymesNo results for input(s): TROPONINI in the last 168 hours. No results for input(s): TROPIPOC in the last 168 hours.   BNPNo results for input(s): BNP, PROBNP in the last 168 hours.   DDimer No results for input(s): DDIMER in the last 168 hours.   Radiology    Dg Chest Port 1 View  Result Date: 02/08/2017 CLINICAL DATA:  76 year old female with pleural effusions and shortness breath. Subsequent encounter. EXAM: PORTABLE CHEST 1 VIEW COMPARISON:  02/06/2017. FINDINGS: Extensive fusion thoracic spine extending into lumbar spine. Inferior aspect not imaged. Right central line may be in place versus overlying lead. Cardiomegaly. Pulmonary vascular congestion. Left base atelectasis/pleural effusion. Slightly tortuous aorta. IMPRESSION: Overall no significant change. Cardiomegaly with pulmonary vascular congestion, left-sided small pleural effusion and basilar atelectasis. Electronically Signed   By: Genia Del M.D.   On: 02/08/2017 19:20    Cardiac Studies   2D echo 02/07/17  - Left ventricle: The cavity size was normal. Wall thickness was normal. Systolic function  was normal. The estimated ejection fraction was in the range of 60% to 65%. Wall motion was normal; there were no regional wall motion abnormalities. Doppler parameters are consistent with abnormal left ventricular relaxation (grade 1 diastolic dysfunction). - Mitral valve: Calcified annulus. There was mild regurgitation. - Pulmonary arteries: Systolic pressure was mildly to moderately increased. PA peak pressure: 48 mm Hg (S). Impressions: - Normal LV systolic function; mild diastolic dysfunction; mild MR; trace TR with mild to moderate elevation in pulmonary pressure.  Patient Profile     76 y.o. female with hx of lymphoma in remission, SVT vs afib 2009, HTN, HLD, obesity, mild pulm HTN, asthma who presented to [facility] 01/31/17 with n/v, workup revealed perforated duodenal ulcer s/p laparoscopy and drain placement 02/01/17. Cardiology following for tachycardia and new LBBB.  Assessment & Plan    1. Perforated Duodenal Ulcer: S/p diagnostic laparoscopy . Management per general surgery. Avoid prednisone at this point  2. Tachycardia/ PSVT:  Rate improved with increase in metoprolol to 75 mg. Currently NSR with resting HR in the 80s. She denies any palpitations. BP is stable.  EF normal on echo at 60-65%. Continue BB. Improved. LBBB  3. HTN: controlled on current regimen. stable  4. Asthma/ Cough/Wheeze:   Continue home oral lasix dose, 40 mg daily with potassium. Her potassium drops at home. Continue inhalers.   May follow up with Dr. Sharlett Iles, PCP.  OK for DC  Signed, Candee Furbish, MD  02/10/2017, 10:19 AM

## 2017-02-10 NOTE — Discharge Summary (Signed)
[facility] Discharge Summary   Patient ID: [name] MRN: [id number] DOB/AGE: 01-Nov-1940 59 y.o.  Admit date: 01/31/2017 Discharge date: 02/10/17  Admitting Diagnosis: Contained perforated duodenal ulcer Asthma HTN SVT Obesity   Discharge Diagnosis Contained perforated duodenal ulcer - resolved Asthma HTN SVT Obesity  Consultants Cardiology - Dr. Curt Bears  Procedures Diagnostic laparoscopy with drain placement - 02/01/17 Dr. Marcello Moores [facility] Course:  Patient is a 76 yo female who presented to [facility] with nausea and abdominal pain.  Workup in the ED showed perforated duodenal ulcer without contrast extravasation.  Patient was admitted and initially managed conservatively with high dose H2 blockers, PPI and IV antibiotics. The following day the patient developed increased abdominal pain and hypotension, it was determined that she should undergo diagnostic laparoscopy with possible laparotomy. Patient consented and underwent above procedure. Tolerated procedure well and was transferred to the ICU. NGT was removed POD#2 and patient was started on clears POD#3. Diet was advanced as tolerated. Foley catheter discontinued POD#3. The patient developed some runs of VT with ambulation on POD#5 and cardiology was consulted. Cardiology recommended increasing metoprolol to twice daily and get an EKG during episodes of tachycardia. TTE was done 02/07/17 and showed normal LV systolic function with mild diastolic dysfunction. Patient's tachycardia resolved with BID metoprolol. Home asthma inhalers were continued in the hospital, but patient experienced an exacerbation on POD#7 Patient was started on claritin in addition to home singulair which seemed to help a lot in combination with nebulizer treatments. Patient was seen by PT/OT and they recommended HHPT. POD#8 the patient's JP drain which was placed in surgery was removed, all drainage was serosanguinous. On POD#9, the patient was  voiding well, tolerating diet, ambulating well, pain well controlled, vital signs stable, incisions c/d/i and felt stable for discharge home.  Patient will follow up in our office in 2-3 weeks and knows to call with questions or concerns. She will call to confirm appointment date/time.    Physical Exam: General:  Alert, NAD, pleasant, comfortable Chest: RRR, normal effort of breathing, CTAB with no wheezing  Abd:  Soft, ND, no tenderness, incisions C/D/I, +BS  Allergies as of 02/10/2017   No Known Allergies     Medication List    TAKE these medications   acetaminophen 500 MG tablet Commonly known as:  TYLENOL Take 1,000 mg by mouth 2 (two) times daily as needed for headache (pain).   ASPERCREME LIDOCAINE EX Apply 1 application topically daily as needed (knee pain).   budesonide 0.25 MG/2ML nebulizer solution Commonly known as:  PULMICORT Take 0.25 mg by nebulization 2 (two) times daily as needed (shortness of breath/wheezing).   DULoxetine 60 MG capsule Commonly known as:  CYMBALTA Take 60 mg by mouth daily.   furosemide 40 MG tablet Commonly known as:  LASIX One tablet 2 x a week or as directed What changed:  how much to take  how to take this  when to take this  additional instructions   lubiprostone 24 MCG capsule Commonly known as:  AMITIZA Take 24 mcg by mouth daily.   metoprolol tartrate 50 MG tablet Commonly known as:  LOPRESSOR Take 50 mg by mouth 2 (two) times daily.   montelukast 10 MG tablet Commonly known as:  SINGULAIR Take 10 mg by mouth daily.   ondansetron 4 MG tablet Commonly known as:  ZOFRAN Take 4 mg by mouth every 8 (eight) hours as needed for nausea or vomiting.   pantoprazole 40 MG tablet Commonly  known as:  PROTONIX Take 1 tablet (40 mg total) by mouth 2 (two) times daily.   potassium chloride SA 20 MEQ tablet Commonly known as:  K-DUR,KLOR-CON Take 20 mEq by mouth at bedtime.   pravastatin 20 MG tablet Commonly known as:   PRAVACHOL Take 20 mg by mouth daily.   PRESCRIPTION MEDICATION Inhale into the lungs at bedtime. CPAP   PROAIR HFA 108 (90 Base) MCG/ACT inhaler Generic drug:  albuterol Inhale 2 puffs into the lungs 2 (two) times daily.   traMADol 50 MG tablet Commonly known as:  ULTRAM Take 50 mg by mouth daily.   TRIBENZOR 40-5-12.5 MG Tabs Generic drug:  Olmesartan-Amlodipine-HCTZ Take 1 tablet by mouth daily.   Vitamin D (Ergocalciferol) 50000 units Caps capsule Commonly known as:  DRISDOL Take 50,000 Units by mouth every Monday.        Follow-up Information    [facility] Follow up.   Specialty:  Home Health Services Why:  Home Health Physical Therapy and Occupational Therapy Contact information: [address] [place] [address] [phone]        Erroll Luna, MD. Call.   Specialty:  General Surgery Why:  Call when you leave the hospital to make an appointment for follow up in 2-3 weeks. Contact information: [address] [place] [address] [phone]           Signed: Brigid Re , [facility] 02/11/2017, 10:52 AM Pager: [phone] Consults: [phone] Mon-Fri 7:00 am-4:30 pm Sat-Sun 7:00 am-11:30 am

## 2017-02-11 DIAGNOSIS — J45998 Other asthma: Secondary | ICD-10-CM | POA: Diagnosis not present

## 2017-02-11 DIAGNOSIS — Z7982 Long term (current) use of aspirin: Secondary | ICD-10-CM | POA: Diagnosis not present

## 2017-02-11 DIAGNOSIS — Z6841 Body Mass Index (BMI) 40.0 and over, adult: Secondary | ICD-10-CM | POA: Diagnosis not present

## 2017-02-11 DIAGNOSIS — F419 Anxiety disorder, unspecified: Secondary | ICD-10-CM | POA: Diagnosis not present

## 2017-02-11 DIAGNOSIS — I447 Left bundle-branch block, unspecified: Secondary | ICD-10-CM | POA: Diagnosis not present

## 2017-02-11 DIAGNOSIS — I4891 Unspecified atrial fibrillation: Secondary | ICD-10-CM | POA: Diagnosis not present

## 2017-02-11 DIAGNOSIS — J45909 Unspecified asthma, uncomplicated: Secondary | ICD-10-CM | POA: Diagnosis not present

## 2017-02-11 DIAGNOSIS — F418 Other specified anxiety disorders: Secondary | ICD-10-CM | POA: Diagnosis not present

## 2017-02-11 DIAGNOSIS — I10 Essential (primary) hypertension: Secondary | ICD-10-CM | POA: Diagnosis not present

## 2017-02-11 DIAGNOSIS — Z96651 Presence of right artificial knee joint: Secondary | ICD-10-CM | POA: Diagnosis not present

## 2017-02-11 DIAGNOSIS — Z48815 Encounter for surgical aftercare following surgery on the digestive system: Secondary | ICD-10-CM | POA: Diagnosis not present

## 2017-02-11 DIAGNOSIS — Z8673 Personal history of transient ischemic attack (TIA), and cerebral infarction without residual deficits: Secondary | ICD-10-CM | POA: Diagnosis not present

## 2017-02-11 DIAGNOSIS — F3289 Other specified depressive episodes: Secondary | ICD-10-CM | POA: Diagnosis not present

## 2017-02-14 DIAGNOSIS — I4891 Unspecified atrial fibrillation: Secondary | ICD-10-CM | POA: Diagnosis not present

## 2017-02-14 DIAGNOSIS — I10 Essential (primary) hypertension: Secondary | ICD-10-CM | POA: Diagnosis not present

## 2017-02-14 DIAGNOSIS — J45909 Unspecified asthma, uncomplicated: Secondary | ICD-10-CM | POA: Diagnosis not present

## 2017-02-14 DIAGNOSIS — I447 Left bundle-branch block, unspecified: Secondary | ICD-10-CM | POA: Diagnosis not present

## 2017-02-14 DIAGNOSIS — Z48815 Encounter for surgical aftercare following surgery on the digestive system: Secondary | ICD-10-CM | POA: Diagnosis not present

## 2017-02-14 DIAGNOSIS — F419 Anxiety disorder, unspecified: Secondary | ICD-10-CM | POA: Diagnosis not present

## 2017-02-17 DIAGNOSIS — G4733 Obstructive sleep apnea (adult) (pediatric): Secondary | ICD-10-CM | POA: Diagnosis not present

## 2017-02-17 DIAGNOSIS — R7301 Impaired fasting glucose: Secondary | ICD-10-CM | POA: Diagnosis not present

## 2017-02-17 DIAGNOSIS — K275 Chronic or unspecified peptic ulcer, site unspecified, with perforation: Secondary | ICD-10-CM | POA: Diagnosis not present

## 2017-02-17 DIAGNOSIS — I10 Essential (primary) hypertension: Secondary | ICD-10-CM | POA: Diagnosis not present

## 2017-02-17 DIAGNOSIS — M545 Low back pain: Secondary | ICD-10-CM | POA: Diagnosis not present

## 2017-02-17 DIAGNOSIS — Z6841 Body Mass Index (BMI) 40.0 and over, adult: Secondary | ICD-10-CM | POA: Diagnosis not present

## 2017-02-17 DIAGNOSIS — J45998 Other asthma: Secondary | ICD-10-CM | POA: Diagnosis not present

## 2017-02-17 DIAGNOSIS — M25562 Pain in left knee: Secondary | ICD-10-CM | POA: Diagnosis not present

## 2017-02-25 DIAGNOSIS — M25562 Pain in left knee: Secondary | ICD-10-CM | POA: Diagnosis not present

## 2017-02-25 DIAGNOSIS — Z6841 Body Mass Index (BMI) 40.0 and over, adult: Secondary | ICD-10-CM | POA: Diagnosis not present

## 2017-02-25 DIAGNOSIS — J45998 Other asthma: Secondary | ICD-10-CM | POA: Diagnosis not present

## 2017-04-08 ENCOUNTER — Encounter: Payer: Self-pay | Admitting: Nurse Practitioner

## 2017-04-29 ENCOUNTER — Encounter (INDEPENDENT_AMBULATORY_CARE_PROVIDER_SITE_OTHER): Payer: Self-pay

## 2017-04-29 ENCOUNTER — Other Ambulatory Visit (INDEPENDENT_AMBULATORY_CARE_PROVIDER_SITE_OTHER): Payer: Medicare Other

## 2017-04-29 ENCOUNTER — Ambulatory Visit (INDEPENDENT_AMBULATORY_CARE_PROVIDER_SITE_OTHER): Payer: Medicare Other | Admitting: Nurse Practitioner

## 2017-04-29 ENCOUNTER — Encounter: Payer: Self-pay | Admitting: Nurse Practitioner

## 2017-04-29 VITALS — BP 124/64 | HR 80 | Ht 60.0 in | Wt 270.0 lb

## 2017-04-29 DIAGNOSIS — K279 Peptic ulcer, site unspecified, unspecified as acute or chronic, without hemorrhage or perforation: Secondary | ICD-10-CM | POA: Diagnosis not present

## 2017-04-29 LAB — H. PYLORI ANTIBODY, IGG: H PYLORI IGG: NEGATIVE

## 2017-04-29 NOTE — Patient Instructions (Addendum)
If you are age 77 or older, your body mass index should be between 23-30. Your Body mass index is 52.73 kg/m. If this is out of the aforementioned range listed, please consider follow up with your Primary Care Provider.  If you are age 75 or younger, your body mass index should be between 19-25. Your Body mass index is 52.73 kg/m. If this is out of the aformentioned range listed, please consider follow up with your Primary Care Provider.   Your physician has requested that you go to the basement for the following lab work before leaving today: H pylori IgG   Continue daily Protonix.  AVOID anti-inflammatories.  We will contact you regarding an endoscopy at [facility]  with Dr. Loletha Carrow when schedule becomes available.  Thank you for choosing me and [facility].   Tye Savoy, NP

## 2017-04-29 NOTE — Progress Notes (Signed)
HPI:  Patient is a 76 year old female with asthma, history of lymphoma , obstructive sleep apnea requiring CPAP , obesity and HTN. She is referred by Dr. Brantley Stage with [facility] surgery for evaluation of peptic ulcer disease. On July 2 patient presented to the emergency department with a three-day history of upper abdominal pain CT scan demonstrated wall thickening of the distal stomach/proximal duodenum with adjacent inflammation and a small foci of pneumoperitoneum. She underwent diagnostic laparoscopy with findings of mild localized peritonitis over omentum which was stuck to first portion of duodenum. Omentum was stuck on the ulcer and not removed. Drain place.  On postop day 5 she developed some runs of V. Tach, cardiology evaluated. Started on metoprolol. TTE showed normal LV systolic function with mild diastolic function. Tachycardia resolved with metoprolol. Patient had NOT been taking anti-inflammatories, just Tylenol and Ultram for pain.    Past Medical History:  Diagnosis Date  . Anxiety   . Arthritis   . Asthma    hx of  . Back pain   . Chronic constipation   . Depression    hx of  . GERD (gastroesophageal reflux disease)   . High cholesterol   . Hypertension   . Lymphoma ([place])   . Obesity   . Sleep apnea    uses a CPAP  . Stroke (HCC)    neck  . SVT (supraventricular tachycardia) (H[facility])      Past Surgical History:  Procedure Laterality Date  . BACK SURGERY  2009   for lymphoma  . BONE BIOPSY    . CESAREAN SECTION    . ESOPHAGOGASTRODUODENOSCOPY    . EYE SURGERY    . LAPAROSCOPY N/A 02/01/2017   Procedure: LAPAROSCOPY DIAGNOSTIC;  Surgeon: Erroll Luna, MD;  Location: [place];  Service: General;  Laterality: N/A;  . REPLACEMENT TOTAL KNEE     right  . TOTAL ABDOMINAL HYSTERECTOMY  1973   Family History  Problem Relation Age of Onset  . Lymphoma Mother   . Breast cancer Mother   . Coronary artery disease Father 25       died  . Stroke Father     . Colon cancer Maternal Uncle    Social History  Substance Use Topics  . Smoking status: Never Smoker  . Smokeless tobacco: Never Used  . Alcohol use No   Current Outpatient Prescriptions  Medication Sig Dispense Refill  . acetaminophen (TYLENOL) 500 MG tablet Take 1,000 mg by mouth 2 (two) times daily as needed for headache (pain).    Marland Kitchen albuterol (PROAIR HFA) 108 (90 Base) MCG/ACT inhaler Inhale 2 puffs into the lungs 2 (two) times daily.    . ASPERCREME LIDOCAINE EX Apply 1 application topically daily as needed (knee pain).    Marland Kitchen aspirin EC 81 MG tablet Take 81 mg by mouth daily.    . budesonide (PULMICORT) 0.25 MG/2ML nebulizer solution Take 0.25 mg by nebulization 2 (two) times daily as needed (shortness of breath/wheezing).    . Budesonide-Formoterol Fumarate (SYMBICORT IN) Inhale into the lungs. As needed    . DULoxetine (CYMBALTA) 60 MG capsule Take 60 mg by mouth daily.      . Fluticasone-Umeclidin-Vilant (TRELEGY ELLIPTA) 100-62.5-25 MCG/INH AEPB Inhale 1 puff into the lungs daily.    . furosemide (LASIX) 40 MG tablet Take 40 mg by mouth daily.    Marland Kitchen lubiprostone (AMITIZA) 24 MCG capsule Take 24 mcg by mouth daily.     . Menthol, Topical Analgesic, (BIOFREEZE)  4 % GEL Apply topically. As needed for knee pain    . metoprolol (LOPRESSOR) 50 MG tablet Take 50 mg by mouth 2 (two) times daily.     . montelukast (SINGULAIR) 10 MG tablet Take 10 mg by mouth daily.     . Olmesartan-Amlodipine-HCTZ (TRIBENZOR) 40-5-12.5 MG TABS Take 1 tablet by mouth daily.      . ondansetron (ZOFRAN) 4 MG tablet Take 4 mg by mouth every 8 (eight) hours as needed for nausea or vomiting.    . pantoprazole (PROTONIX) 40 MG tablet Take 1 tablet (40 mg total) by mouth 2 (two) times daily. 60 tablet 2  . potassium chloride SA (K-DUR,KLOR-CON) 20 MEQ tablet Take 20 mEq by mouth at bedtime.     . pravastatin (PRAVACHOL) 20 MG tablet Take 20 mg by mouth daily.      Marland Kitchen PRESCRIPTION MEDICATION Inhale into the lungs  at bedtime. CPAP    . senna (SENOKOT) 8.6 MG TABS tablet Take 1 tablet by mouth daily as needed for mild constipation.    . traMADol (ULTRAM) 50 MG tablet Take 50 mg by mouth daily.     . Vitamin D, Ergocalciferol, (DRISDOL) 50000 UNITS CAPS Take 50,000 Units by mouth every Monday.      No current facility-administered medications for this visit.    No Known Allergies   Review of Systems: All systems reviewed and negative except where noted in HPI.    Physical Exam: BP 124/64 (Cuff Size: Large)   Pulse 80   Ht 5' (1.524 m)   Wt 270 lb (122.5 kg)   BMI 52.73 kg/m  Constitutional:  Pleasant obese white female in no acute distress. Psychiatric: Normal mood and affect. Behavior is normal. EENT: Pupils normal.  Conjunctivae are normal. No scleral icterus. Neck supple.  Cardiovascular: Normal rate, regular rhythm. No edema Pulmonary/chest: Effort normal and breath sounds normal. No wheezing, rales or rhonchi. Abdominal: Soft, nondistended. Nontender. Bowel sounds active throughout. There are no masses palpable. No hepatomegaly. Lymphadenopathy: No cervical adenopathy noted. Neurological: Alert and oriented to person place and time. Skin: Skin is warm and dry. No rashes noted.   ASSESSMENT AND PLAN:  71. 76 year old female recently hospitalized for perforated duodenal ulcer (small foci of pneumoperitoneum on CTscan). She is s/p diagnostic laparoscopy. Fortunately only a mild localized area of peritonitis found. Surgery has referred her for consideration of an EGD. Etiology of ulcer not clear. She was not taking NSAIDs. No prior history of PUD.  -The risks and benefits of EGD were discussed and the patient agrees to proceed. Will need to be done at the hospital given BMI.  -continue daily PPI -H.pylori IgG  2. Asthma, OSA requring CPAP, morbid obesity.   3. History of lymphoma  4. Chronic constipation, on amitiza  5. Colon cancer screening. Never had a colonoscopy and I did not  arrange for one since screenings not usually initiated at her age. No rectal bleeding, bowel changes, or other alarm symptoms. She is mildly anemia (10.3) but preoperatively her hgb was normal at 13.2.   Patient understands that the development of any symptoms of colon cancer would warrant workup.   Tye Savoy, NP  04/29/2017, 10:18 AM  Cc: Erroll Luna, MD

## 2017-04-29 NOTE — Progress Notes (Signed)
Thank you for sending this case to me. I have reviewed the entire note, and the outlined plan seems appropriate.  Yes, checking h pylori first (and treating if positive) is essential before performing the EGD. Given her age and comorbidities, it is not clear if screening colonoscopy is the right choice for her at this point. That can be addressed after settling this issue of the ulcer. Wilfrid Lund, MD

## 2017-05-03 ENCOUNTER — Telehealth: Payer: Self-pay

## 2017-05-03 ENCOUNTER — Other Ambulatory Visit: Payer: Self-pay

## 2017-05-03 DIAGNOSIS — K279 Peptic ulcer, site unspecified, unspecified as acute or chronic, without hemorrhage or perforation: Secondary | ICD-10-CM

## 2017-05-03 NOTE — Telephone Encounter (Signed)
-----   Message from Lowell Guitar, [place] sent at 04/29/2017 10:59 AM EDT ----- Regarding: ENDO at [facility] Per Nevin Bloodgood, patient needs and ENDO at [facility] (BMI 52) with Dr Loletha Carrow.  Could you please let me know when his schedule is available so I may put this patient on [place]. Schedule. Per [employer]; does not need to come back before procedure.  Thanks, Peter Congo

## 2017-05-03 NOTE — Telephone Encounter (Signed)
Instructions mailed to patient.  Spoke with patient on the phone.  Pt aware of procedure date and time.  Instructions given verbally to patient as well as mailed.

## 2017-05-03 NOTE — Telephone Encounter (Signed)
-----   Message from Doristine Counter, RN sent at 05/03/2017  8:41 AM EDT ----- Regarding: RE: ENDO at [facility] The December schedule is out, Dr. Loletha Carrow' day looks like it is Monday, 12/3. Typically the days are Tuesday and Friday, but I verified with Exie Parody on this date. So you can schedule him. Thanks. ----- Message ----- From: Lowell Guitar, RMA Sent: 04/29/2017  10:59 AM To: Elias Else, CMA, Doristine Counter, RN, # Subject: ENDO at [facility]                                     Per Nevin Bloodgood, patient needs and ENDO at [facility] (BMI 52) with Dr Loletha Carrow.  Could you please let me know when his schedule is available so I may put this patient on [place]. Schedule. Per [employer]; does not need to come back before procedure.  Thanks, Peter Congo

## 2017-05-03 NOTE — Telephone Encounter (Signed)
Spoke with Maudie Mercury at [facility] - EGD scheduled for 07/04/17 at 930 am.  Pt aware.  Instructions mailed to pt.

## 2017-06-03 DIAGNOSIS — Z23 Encounter for immunization: Secondary | ICD-10-CM | POA: Diagnosis not present

## 2017-06-03 DIAGNOSIS — M25562 Pain in left knee: Secondary | ICD-10-CM | POA: Diagnosis not present

## 2017-06-03 DIAGNOSIS — R7301 Impaired fasting glucose: Secondary | ICD-10-CM | POA: Diagnosis not present

## 2017-06-03 DIAGNOSIS — L03113 Cellulitis of right upper limb: Secondary | ICD-10-CM | POA: Diagnosis not present

## 2017-06-03 DIAGNOSIS — I10 Essential (primary) hypertension: Secondary | ICD-10-CM | POA: Diagnosis not present

## 2017-06-03 DIAGNOSIS — Z6841 Body Mass Index (BMI) 40.0 and over, adult: Secondary | ICD-10-CM | POA: Diagnosis not present

## 2017-07-01 ENCOUNTER — Telehealth: Payer: Self-pay | Admitting: Nurse Practitioner

## 2017-07-01 NOTE — Telephone Encounter (Signed)
That is unfortunate since this is a hospital slot.  Her scope must be done at the hospital because of her BMI.  As is stands now, I have a MAC slot open at [facility] on Friday, Jan 4th.  If she wants that, she should take it now.  I only have 1 or 2 hospital endoscopy blocks per month.

## 2017-07-01 NOTE — Telephone Encounter (Signed)
The patient's care partner has backed out on her. She no longer has transportation. Will have to reschedule her colonoscopy.  I have contacted [facility]. The patient wants a Friday.

## 2017-07-04 ENCOUNTER — Ambulatory Visit (HOSPITAL_COMMUNITY): Admission: RE | Admit: 2017-07-04 | Payer: Medicare Other | Source: Ambulatory Visit | Admitting: Gastroenterology

## 2017-07-04 ENCOUNTER — Other Ambulatory Visit: Payer: Self-pay

## 2017-07-04 ENCOUNTER — Encounter (HOSPITAL_COMMUNITY): Admission: RE | Payer: Self-pay | Source: Ambulatory Visit

## 2017-07-04 DIAGNOSIS — K279 Peptic ulcer, site unspecified, unspecified as acute or chronic, without hemorrhage or perforation: Secondary | ICD-10-CM

## 2017-07-04 SURGERY — ESOPHAGOGASTRODUODENOSCOPY (EGD) WITH PROPOFOL
Anesthesia: Monitor Anesthesia Care

## 2017-07-04 NOTE — Telephone Encounter (Signed)
Confirmed with [facility] endoscopy that they did correct the schedule for 08/05/17 and do have MAC available. Patient is rescheduled to Friday, 08/05/17. I have called and left detailed message for patient about date/time. I have mailed a new set of prep instructions.  Asked that [name] call back to confirm [name] did get the message.

## 2017-07-28 ENCOUNTER — Encounter (HOSPITAL_COMMUNITY): Payer: Self-pay

## 2017-07-28 ENCOUNTER — Other Ambulatory Visit: Payer: Self-pay

## 2017-08-05 ENCOUNTER — Ambulatory Visit (HOSPITAL_COMMUNITY): Payer: Medicare Other | Admitting: Certified Registered Nurse Anesthetist

## 2017-08-05 ENCOUNTER — Ambulatory Visit (HOSPITAL_COMMUNITY)
Admission: RE | Admit: 2017-08-05 | Discharge: 2017-08-05 | Disposition: A | Discharge status: Home / Self Care | Payer: Medicare Other | Source: Ambulatory Visit | Attending: Gastroenterology | Admitting: Gastroenterology

## 2017-08-05 ENCOUNTER — Other Ambulatory Visit: Payer: Self-pay

## 2017-08-05 ENCOUNTER — Encounter (HOSPITAL_COMMUNITY)
Admission: RE | Disposition: A | Discharge status: Home / Self Care | Payer: Self-pay | Source: Ambulatory Visit | Attending: Gastroenterology

## 2017-08-05 ENCOUNTER — Encounter (HOSPITAL_COMMUNITY): Payer: Self-pay | Admitting: *Deleted

## 2017-08-05 DIAGNOSIS — Z96651 Presence of right artificial knee joint: Secondary | ICD-10-CM | POA: Insufficient documentation

## 2017-08-05 DIAGNOSIS — G4730 Sleep apnea, unspecified: Secondary | ICD-10-CM | POA: Diagnosis not present

## 2017-08-05 DIAGNOSIS — E7800 Pure hypercholesterolemia, unspecified: Secondary | ICD-10-CM | POA: Diagnosis not present

## 2017-08-05 DIAGNOSIS — E669 Obesity, unspecified: Secondary | ICD-10-CM | POA: Insufficient documentation

## 2017-08-05 DIAGNOSIS — Z09 Encounter for follow-up examination after completed treatment for conditions other than malignant neoplasm: Secondary | ICD-10-CM | POA: Diagnosis not present

## 2017-08-05 DIAGNOSIS — F419 Anxiety disorder, unspecified: Secondary | ICD-10-CM | POA: Insufficient documentation

## 2017-08-05 DIAGNOSIS — J45909 Unspecified asthma, uncomplicated: Secondary | ICD-10-CM | POA: Insufficient documentation

## 2017-08-05 DIAGNOSIS — M1990 Unspecified osteoarthritis, unspecified site: Secondary | ICD-10-CM | POA: Insufficient documentation

## 2017-08-05 DIAGNOSIS — Z8673 Personal history of transient ischemic attack (TIA), and cerebral infarction without residual deficits: Secondary | ICD-10-CM | POA: Diagnosis not present

## 2017-08-05 DIAGNOSIS — F329 Major depressive disorder, single episode, unspecified: Secondary | ICD-10-CM | POA: Insufficient documentation

## 2017-08-05 DIAGNOSIS — I471 Supraventricular tachycardia: Secondary | ICD-10-CM | POA: Diagnosis not present

## 2017-08-05 DIAGNOSIS — K3189 Other diseases of stomach and duodenum: Secondary | ICD-10-CM | POA: Diagnosis not present

## 2017-08-05 DIAGNOSIS — I10 Essential (primary) hypertension: Secondary | ICD-10-CM | POA: Insufficient documentation

## 2017-08-05 DIAGNOSIS — K255 Chronic or unspecified gastric ulcer with perforation: Secondary | ICD-10-CM

## 2017-08-05 DIAGNOSIS — Z7951 Long term (current) use of inhaled steroids: Secondary | ICD-10-CM | POA: Insufficient documentation

## 2017-08-05 DIAGNOSIS — K279 Peptic ulcer, site unspecified, unspecified as acute or chronic, without hemorrhage or perforation: Secondary | ICD-10-CM

## 2017-08-05 DIAGNOSIS — Z79899 Other long term (current) drug therapy: Secondary | ICD-10-CM | POA: Insufficient documentation

## 2017-08-05 DIAGNOSIS — K219 Gastro-esophageal reflux disease without esophagitis: Secondary | ICD-10-CM | POA: Diagnosis not present

## 2017-08-05 DIAGNOSIS — C8590 Non-Hodgkin lymphoma, unspecified, unspecified site: Secondary | ICD-10-CM

## 2017-08-05 DIAGNOSIS — K275 Chronic or unspecified peptic ulcer, site unspecified, with perforation: Secondary | ICD-10-CM

## 2017-08-05 DIAGNOSIS — Z6841 Body Mass Index (BMI) 40.0 and over, adult: Secondary | ICD-10-CM | POA: Insufficient documentation

## 2017-08-05 DIAGNOSIS — R112 Nausea with vomiting, unspecified: Secondary | ICD-10-CM | POA: Diagnosis not present

## 2017-08-05 HISTORY — DX: Dyspnea, unspecified: R06.00

## 2017-08-05 HISTORY — DX: Family history of other specified conditions: Z84.89

## 2017-08-05 HISTORY — PX: ESOPHAGOGASTRODUODENOSCOPY (EGD) WITH PROPOFOL: SHX5813

## 2017-08-05 SURGERY — ESOPHAGOGASTRODUODENOSCOPY (EGD) WITH PROPOFOL
Anesthesia: Monitor Anesthesia Care

## 2017-08-05 MED ORDER — LIDOCAINE 2% (20 MG/ML) 5 ML SYRINGE
INTRAMUSCULAR | Status: DC | PRN
  Administered 2017-08-05: 100 mg via INTRAVENOUS

## 2017-08-05 MED ORDER — PROPOFOL 10 MG/ML IV BOLUS
INTRAVENOUS | Status: DC | PRN
  Administered 2017-08-05: 50 mg via INTRAVENOUS

## 2017-08-05 MED ORDER — PROPOFOL 500 MG/50ML IV EMUL
INTRAVENOUS | Status: DC | PRN
  Administered 2017-08-05: 125 ug/kg/min via INTRAVENOUS

## 2017-08-05 MED ORDER — SODIUM CHLORIDE 0.9 % IV SOLN: INTRAVENOUS | Status: DC

## 2017-08-05 MED ORDER — PROPOFOL 10 MG/ML IV BOLUS
INTRAVENOUS | Status: AC
Start: 2017-08-05 — End: 2017-08-05
  Filled 2017-08-05: qty 40

## 2017-08-05 MED ORDER — LACTATED RINGERS IV SOLN
INTRAVENOUS | Status: DC
  Administered 2017-08-05 (×2): via INTRAVENOUS

## 2017-08-05 SURGICAL SUPPLY — 14 items

## 2017-08-05 NOTE — Anesthesia Preprocedure Evaluation (Addendum)
Anesthesia Evaluation  Patient identified by MRN, date of birth, ID band Patient awake    Airway Mallampati: III  TM Distance: >3 FB     Dental  (+) Partial Upper, Partial Lower   Pulmonary shortness of breath, asthma , sleep apnea ,    breath sounds clear to auscultation       Cardiovascular hypertension,  Rhythm:Regular Rate:Normal     Neuro/Psych PSYCHIATRIC DISORDERS Anxiety CVA    GI/Hepatic PUD, GERD  ,Pt with history of perforated Ulcer   Endo/Other    Renal/GU      Musculoskeletal  (+) Arthritis ,   Abdominal   Peds  Hematology   Anesthesia Other Findings   Reproductive/Obstetrics                           Anesthesia Physical Anesthesia Plan  ASA: IV  Anesthesia Plan: MAC   Post-op Pain Management:    Induction:   PONV Risk Score and Plan: 2 and Treatment may vary due to age or medical condition  Airway Management Planned: Mask and Natural Airway  Additional Equipment:   Intra-op Plan:   Post-operative Plan:   Informed Consent: I have reviewed the patients History and Physical, chart, labs and discussed the procedure including the risks, benefits and alternatives for the proposed anesthesia with the patient or authorized representative who has indicated his/her understanding and acceptance.     Plan Discussed with:   Anesthesia Plan Comments:         Anesthesia Quick Evaluation

## 2017-08-05 NOTE — Transfer of Care (Signed)
Immediate Anesthesia Transfer of Care Note  Patient: [name]  Procedure(s) Performed: ESOPHAGOGASTRODUODENOSCOPY (EGD) WITH PROPOFOL (N/A )  Patient Location: PACU and Endoscopy Unit  Anesthesia Type:MAC  Level of Consciousness: awake and alert   Airway & Oxygen Therapy: Patient Spontanous Breathing and Patient connected to nasal cannula oxygen  Post-op Assessment: Report given to RN and Post -op Vital signs reviewed and stable  Post vital signs: Reviewed and stable  Last Vitals:  Vitals:   08/05/17 0747  BP: (!) 155/66  Resp: 10  Temp: 36.8 C  SpO2: 98%    Last Pain:  Vitals:   08/05/17 0747  TempSrc: Oral         Complications: No apparent anesthesia complications

## 2017-08-05 NOTE — Interval H&P Note (Signed)
History and Physical Interval Note:  08/05/2017 8:35 AM  [name]  has presented today for surgery, with the diagnosis of Peptic ulcer disease  The various methods of treatment have been discussed with the patient and family. After consideration of risks, benefits and other options for treatment, the patient has consented to  Procedure(s): ESOPHAGOGASTRODUODENOSCOPY (EGD) WITH PROPOFOL (N/A) as a surgical intervention .  The patient's history has been reviewed, patient examined, no change in status, stable for surgery.  I have reviewed the patient's chart and labs.  Questions were answered to the patient's satisfaction.     [name]

## 2017-08-05 NOTE — Op Note (Signed)
[facility] Patient Name: [name] Procedure Date: 08/05/2017 MRN: [id number] Attending MD: Estill Cotta. 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 Patient had been taking 81 mg aspirin                            (no other NSAIDs) prior to ulcer perforation. H.                            pylori IgG antibody negative. Providers:                Estill Cotta. Loletha Carrow, MD, Angus Seller, William Dalton, Technician Referring MD:              Medicines:                Monitored Anesthesia Care Complications:            No immediate complications. Estimated Blood Loss:     Estimated blood loss: none. Procedure:                Pre-Anesthesia Assessment:                           - Prior to the procedure, a History and Physical                            was performed, and patient medications and                            allergies were reviewed. The patient's tolerance of                            previous anesthesia was also reviewed. The risks                            and benefits of the procedure and the sedation                            options and risks were discussed with the patient.                            All questions were answered, and informed consent                            was obtained. Prior Anticoagulants: The patient has  taken no previous anticoagulant or antiplatelet                            agents. ASA Grade Assessment: III - A patient with                            severe systemic disease. After reviewing the risks                            and benefits, the patient was deemed in                            satisfactory condition to undergo the procedure.                           After obtaining informed  consent, the endoscope was                            passed under direct vision. Throughout the                            procedure, the patient's blood pressure, pulse, and                            oxygen saturations were monitored continuously. The                            Endoscope was introduced through the mouth, and                            advanced to the second part of duodenum. The upper                            GI endoscopy was accomplished without difficulty.                            The patient tolerated the procedure well. Scope In: Scope Out: Findings:      The esophagus was normal.      A mild deformity was found at the pylorus, consistent with healed ulcer.       The scope passed the pylorus with mild resistance. There was no retained       food or fluid in the stomach      The cardia and gastric fundus were normal on retroflexion.      The examined duodenum was normal. Impression:               - Normal esophagus.                           - Deformity in the pylorus. Ulcer has completely                            healed                           -  Normal examined duodenum.                           - No specimens collected. Moderate Sedation:      MAC sedation used Recommendation:           - Patient has a contact number available for                            emergencies. The signs and symptoms of potential                            delayed complications were discussed with the                            patient. Return to normal activities tomorrow.                            Written discharge instructions were provided to the                            patient.                           - Resume previous diet.                           - Discontinue PPIs.                           - No aspirin, ibuprofen, naproxen, or other                            non-steroidal anti-inflammatory drugs. Procedure Code(s):        --- Professional ---                            [phone], Esophagogastroduodenoscopy, flexible,                            transoral; diagnostic, including collection of                            specimen(s) by brushing or washing, when performed                            (separate procedure) Diagnosis Code(s):        --- Professional ---                           K31.89, Other diseases of stomach and duodenum                           K25.5, Chronic or unspecified gastric ulcer with                            perforation CPT copyright 2016 [employer]. All rights reserved. The codes  documented in this report are preliminary and upon coder review may  be revised to meet current compliance requirements. Henry L. Loletha Carrow, MD 08/05/2017 8:57:12 AM This report has been signed electronically. Number of Addenda: 0

## 2017-08-05 NOTE — Discharge Instructions (Signed)
YOU HAD AN ENDOSCOPIC PROCEDURE TODAY: Refer to the procedure report and other information in the discharge instructions given to you for any specific questions about what was found during the examination. If this information does not answer your questions, please call [place] office at [phone] to clarify.  ° °YOU SHOULD EXPECT: Some feelings of bloating in the abdomen. Passage of more gas than usual. Walking can help get rid of the air that was put into your GI tract during the procedure and reduce the bloating. If you had a lower endoscopy (such as a colonoscopy or flexible sigmoidoscopy) you may notice spotting of blood in your stool or on the toilet paper. Some abdominal soreness may be present for a day or two, also. ° °DIET: Your first meal following the procedure should be a light meal and then it is ok to progress to your normal diet. A half-sandwich or bowl of soup is an example of a good first meal. Heavy or fried foods are harder to digest and may make you feel nauseous or bloated. Drink plenty of fluids but you should avoid alcoholic beverages for 24 hours. If you had a esophageal dilation, please see attached instructions for diet.   ° °ACTIVITY: Your care partner should take you home directly after the procedure. You should plan to take it easy, moving slowly for the rest of the day. You can resume normal activity the day after the procedure however YOU SHOULD NOT DRIVE, use power tools, machinery or perform tasks that involve climbing or major physical exertion for 24 hours (because of the sedation medicines used during the test).  ° °SYMPTOMS TO REPORT IMMEDIATELY: °A gastroenterologist can be reached at any hour. Please call [phone]  for any of the following symptoms:  °Following lower endoscopy (colonoscopy, flexible sigmoidoscopy) °Excessive amounts of blood in the stool  °Significant tenderness, worsening of abdominal pains  °Swelling of the abdomen that is new, acute  °Fever of 100° or  higher  °Following upper endoscopy (EGD, EUS, ERCP, esophageal dilation) °Vomiting of blood or coffee ground material  °New, significant abdominal pain  °New, significant chest pain or pain under the shoulder blades  °Painful or persistently difficult swallowing  °New shortness of breath  °Black, tarry-looking or red, bloody stools ° °FOLLOW UP:  °If any biopsies were taken you will be contacted by phone or by letter within the next 1-3 weeks. Call [phone]  if you have not heard about the biopsies in 3 weeks.  °Please also call with any specific questions about appointments or follow up tests. ° °

## 2017-08-05 NOTE — Anesthesia Postprocedure Evaluation (Signed)
Anesthesia Post Note  Patient: [name]  Procedure(s) Performed: ESOPHAGOGASTRODUODENOSCOPY (EGD) WITH PROPOFOL (N/A )     Patient location during evaluation: PACU Anesthesia Type: MAC Level of consciousness: awake and alert Pain management: pain level controlled Vital Signs Assessment: post-procedure vital signs reviewed and stable Respiratory status: spontaneous breathing, nonlabored ventilation, respiratory function stable and patient connected to nasal cannula oxygen Cardiovascular status: stable and blood pressure returned to baseline Postop Assessment: no apparent nausea or vomiting Anesthetic complications: no    Last Vitals:  Vitals:   08/05/17 0925 08/05/17 0951  BP:    Pulse: 63   Resp: 13   Temp:  37 C  SpO2: 97%     Last Pain:  Vitals:   08/05/17 0951  TempSrc: Oral                 Barnet Glasgow

## 2017-08-05 NOTE — H&P (Signed)
History:  This patient presents for endoscopic testing for gastric/duodenal ulcer.  [name] Referring physician: Leanna Battles, MD  Past Medical History: Past Medical History:  Diagnosis Date  . Anxiety   . Arthritis   . Asthma    hx of  . Back pain   . Chronic constipation   . Depression    hx of  . Dyspnea   . Family history of adverse reaction to anesthesia    DAD bp ELEVATED   . GERD (gastroesophageal reflux disease)   . High cholesterol   . Hypertension   . Lymphoma ([place])   . Obesity   . Sleep apnea    uses a CPAP  . Stroke ([place])    2010  . SVT (supraventricular tachycardia) ([facility])      Past Surgical History: Past Surgical History:  Procedure Laterality Date  . BACK SURGERY  2009   for lymphoma  . BONE BIOPSY    . CESAREAN SECTION    . ESOPHAGOGASTRODUODENOSCOPY    . EYE SURGERY     cataracts  . LAPAROSCOPY N/A 02/01/2017   Procedure: LAPAROSCOPY DIAGNOSTIC;  Surgeon: Erroll Luna, MD;  Location: [facility];  Service: General;  Laterality: N/A;  . REPLACEMENT TOTAL KNEE     right  . TOTAL ABDOMINAL HYSTERECTOMY  1973    Allergies: No Known Allergies  Outpatient Meds: Current Facility-Administered Medications  Medication Dose Route Frequency Provider Last Rate Last Dose  . lactated ringers infusion   Intravenous Continuous [name], MD 10 mL/hr at 08/05/17 0757        ___________________________________________________________________ Objective   Exam:  BP (!) 155/66   Temp 98.3 F (36.8 C) (Oral)   Resp 10   Ht 5' (1.524 m)   Wt 270 lb (122.5 kg)   SpO2 98%   BMI 52.73 kg/m    CV: RRR without murmur, S1/S2, no JVD, no peripheral edema  Resp: clear to auscultation bilaterally, normal RR and effort noted  GI: obese, soft, no tenderness, with active bowel sounds. No guarding or palpable organomegaly noted.  Neuro: awake, alert and oriented x 3. Normal gross motor function and fluent  speech   Assessment:  Gastric/duodenal ulcer with perforation.  Cause unclear. She had been taking 81 mg asa prior, no other NSAIDs H pylori IgG negative  Plan:  EGD   [name]

## 2017-08-08 ENCOUNTER — Encounter (HOSPITAL_COMMUNITY): Payer: Self-pay | Admitting: Gastroenterology

## 2017-08-12 DIAGNOSIS — Z803 Family history of malignant neoplasm of breast: Secondary | ICD-10-CM | POA: Diagnosis not present

## 2017-08-12 DIAGNOSIS — Z1231 Encounter for screening mammogram for malignant neoplasm of breast: Secondary | ICD-10-CM | POA: Diagnosis not present

## 2017-09-08 DIAGNOSIS — M1712 Unilateral primary osteoarthritis, left knee: Secondary | ICD-10-CM | POA: Diagnosis not present

## 2017-09-09 DIAGNOSIS — G4733 Obstructive sleep apnea (adult) (pediatric): Secondary | ICD-10-CM | POA: Diagnosis not present

## 2017-09-09 DIAGNOSIS — I10 Essential (primary) hypertension: Secondary | ICD-10-CM | POA: Diagnosis not present

## 2017-09-09 DIAGNOSIS — Z1389 Encounter for screening for other disorder: Secondary | ICD-10-CM | POA: Diagnosis not present

## 2017-09-09 DIAGNOSIS — R7301 Impaired fasting glucose: Secondary | ICD-10-CM | POA: Diagnosis not present

## 2017-09-09 DIAGNOSIS — Z6841 Body Mass Index (BMI) 40.0 and over, adult: Secondary | ICD-10-CM | POA: Diagnosis not present

## 2017-09-09 DIAGNOSIS — M25562 Pain in left knee: Secondary | ICD-10-CM | POA: Diagnosis not present

## 2017-09-09 DIAGNOSIS — J45998 Other asthma: Secondary | ICD-10-CM | POA: Diagnosis not present

## 2017-10-06 DIAGNOSIS — C8330 Diffuse large B-cell lymphoma, unspecified site: Secondary | ICD-10-CM | POA: Diagnosis not present

## 2017-10-06 DIAGNOSIS — Z9221 Personal history of antineoplastic chemotherapy: Secondary | ICD-10-CM | POA: Diagnosis not present

## 2017-10-06 DIAGNOSIS — Z8579 Personal history of other malignant neoplasms of lymphoid, hematopoietic and related tissues: Secondary | ICD-10-CM | POA: Diagnosis not present

## 2017-10-06 DIAGNOSIS — Z08 Encounter for follow-up examination after completed treatment for malignant neoplasm: Secondary | ICD-10-CM | POA: Diagnosis not present

## 2017-10-06 DIAGNOSIS — J45909 Unspecified asthma, uncomplicated: Secondary | ICD-10-CM | POA: Diagnosis not present

## 2017-10-06 DIAGNOSIS — K275 Chronic or unspecified peptic ulcer, site unspecified, with perforation: Secondary | ICD-10-CM | POA: Diagnosis not present

## 2017-11-10 DIAGNOSIS — I10 Essential (primary) hypertension: Secondary | ICD-10-CM | POA: Diagnosis not present

## 2017-11-10 DIAGNOSIS — E785 Hyperlipidemia, unspecified: Secondary | ICD-10-CM | POA: Diagnosis not present

## 2017-11-10 DIAGNOSIS — R7301 Impaired fasting glucose: Secondary | ICD-10-CM | POA: Diagnosis not present

## 2017-11-10 DIAGNOSIS — R82998 Other abnormal findings in urine: Secondary | ICD-10-CM | POA: Diagnosis not present

## 2017-11-10 DIAGNOSIS — M810 Age-related osteoporosis without current pathological fracture: Secondary | ICD-10-CM | POA: Diagnosis not present

## 2017-11-11 DIAGNOSIS — H35371 Puckering of macula, right eye: Secondary | ICD-10-CM | POA: Diagnosis not present

## 2017-11-11 DIAGNOSIS — H26493 Other secondary cataract, bilateral: Secondary | ICD-10-CM | POA: Diagnosis not present

## 2017-11-11 DIAGNOSIS — H40023 Open angle with borderline findings, high risk, bilateral: Secondary | ICD-10-CM | POA: Diagnosis not present

## 2017-11-11 DIAGNOSIS — Z961 Presence of intraocular lens: Secondary | ICD-10-CM | POA: Diagnosis not present

## 2017-12-14 DIAGNOSIS — I872 Venous insufficiency (chronic) (peripheral): Secondary | ICD-10-CM | POA: Diagnosis not present

## 2017-12-14 DIAGNOSIS — Z1389 Encounter for screening for other disorder: Secondary | ICD-10-CM | POA: Diagnosis not present

## 2017-12-14 DIAGNOSIS — M810 Age-related osteoporosis without current pathological fracture: Secondary | ICD-10-CM | POA: Diagnosis not present

## 2017-12-14 DIAGNOSIS — R7301 Impaired fasting glucose: Secondary | ICD-10-CM | POA: Diagnosis not present

## 2017-12-14 DIAGNOSIS — E785 Hyperlipidemia, unspecified: Secondary | ICD-10-CM | POA: Diagnosis not present

## 2017-12-14 DIAGNOSIS — F418 Other specified anxiety disorders: Secondary | ICD-10-CM | POA: Diagnosis not present

## 2017-12-14 DIAGNOSIS — Z6841 Body Mass Index (BMI) 40.0 and over, adult: Secondary | ICD-10-CM | POA: Diagnosis not present

## 2017-12-14 DIAGNOSIS — Z0000 Encounter for general adult medical examination without abnormal findings: Secondary | ICD-10-CM | POA: Diagnosis not present

## 2017-12-14 DIAGNOSIS — G4733 Obstructive sleep apnea (adult) (pediatric): Secondary | ICD-10-CM | POA: Diagnosis not present

## 2017-12-14 DIAGNOSIS — M25562 Pain in left knee: Secondary | ICD-10-CM | POA: Diagnosis not present

## 2017-12-14 DIAGNOSIS — I10 Essential (primary) hypertension: Secondary | ICD-10-CM | POA: Diagnosis not present

## 2018-01-08 DIAGNOSIS — J069 Acute upper respiratory infection, unspecified: Secondary | ICD-10-CM | POA: Diagnosis not present

## 2018-01-24 DIAGNOSIS — M545 Low back pain: Secondary | ICD-10-CM | POA: Diagnosis not present

## 2018-01-24 DIAGNOSIS — R600 Localized edema: Secondary | ICD-10-CM | POA: Diagnosis not present

## 2018-01-24 DIAGNOSIS — G463 Brain stem stroke syndrome: Secondary | ICD-10-CM | POA: Diagnosis not present

## 2018-01-24 DIAGNOSIS — J309 Allergic rhinitis, unspecified: Secondary | ICD-10-CM | POA: Diagnosis not present

## 2018-01-24 DIAGNOSIS — G4733 Obstructive sleep apnea (adult) (pediatric): Secondary | ICD-10-CM | POA: Diagnosis not present

## 2018-01-24 DIAGNOSIS — I872 Venous insufficiency (chronic) (peripheral): Secondary | ICD-10-CM | POA: Diagnosis not present

## 2018-01-24 DIAGNOSIS — I10 Essential (primary) hypertension: Secondary | ICD-10-CM | POA: Diagnosis not present

## 2018-01-24 DIAGNOSIS — R05 Cough: Secondary | ICD-10-CM | POA: Diagnosis not present

## 2018-01-24 DIAGNOSIS — J45909 Unspecified asthma, uncomplicated: Secondary | ICD-10-CM | POA: Diagnosis not present

## 2018-01-24 DIAGNOSIS — Z6841 Body Mass Index (BMI) 40.0 and over, adult: Secondary | ICD-10-CM | POA: Diagnosis not present

## 2018-04-26 ENCOUNTER — Encounter: Payer: Self-pay | Admitting: Neurology

## 2018-04-27 ENCOUNTER — Ambulatory Visit (INDEPENDENT_AMBULATORY_CARE_PROVIDER_SITE_OTHER): Payer: Medicare Other | Admitting: Neurology

## 2018-04-27 ENCOUNTER — Encounter: Payer: Self-pay | Admitting: Neurology

## 2018-04-27 VITALS — BP 140/66 | HR 79 | Ht 64.0 in | Wt 274.0 lb

## 2018-04-27 DIAGNOSIS — G4731 Primary central sleep apnea: Secondary | ICD-10-CM | POA: Diagnosis not present

## 2018-04-27 DIAGNOSIS — I471 Supraventricular tachycardia, unspecified: Secondary | ICD-10-CM

## 2018-04-27 DIAGNOSIS — R0601 Orthopnea: Secondary | ICD-10-CM

## 2018-04-27 DIAGNOSIS — I50814 Right heart failure due to left heart failure: Secondary | ICD-10-CM

## 2018-04-27 DIAGNOSIS — G4739 Other sleep apnea: Secondary | ICD-10-CM

## 2018-04-27 DIAGNOSIS — J4540 Moderate persistent asthma, uncomplicated: Secondary | ICD-10-CM

## 2018-04-27 DIAGNOSIS — C859A Non-Hodgkin lymphoma, unspecified, in remission: Secondary | ICD-10-CM

## 2018-04-27 DIAGNOSIS — C8590 Non-Hodgkin lymphoma, unspecified, unspecified site: Secondary | ICD-10-CM

## 2018-04-27 DIAGNOSIS — I6350 Cerebral infarction due to unspecified occlusion or stenosis of unspecified cerebral artery: Secondary | ICD-10-CM | POA: Diagnosis not present

## 2018-04-27 DIAGNOSIS — M8000XD Age-related osteoporosis with current pathological fracture, unspecified site, subsequent encounter for fracture with routine healing: Secondary | ICD-10-CM

## 2018-04-27 NOTE — Progress Notes (Signed)
SLEEP MEDICINE CLINIC   Provider:  Larey Richardson, M D  Primary Care Physician:  Theresa Battles, MD   Referring Provider: Antony Contras, MD    Chief Complaint  Patient presents with  . New Patient (Initial Visit)    pt with daughter. she had a sleep study completed in 2013. she states that she has been unable to get supplies because she was due for a visit. DME [facility]. patient states the machine is not working well.     HPI:  [name] is a 77 y.o. female , seen here as in a referral from Dr. Philip Richardson to re-establish sleep care. The patient had been referred by Dr Leonie Man in January 2013, with a BMI of 46 /h underwent on 10-13-2011 PSG with Capnography, resulting in an AHI of 32/ h, RDI of 38/h and CO2 retention. 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 This was followed by CPAP titration on 11-02-2011, to a final 11 cm water with REM sleep rebound. She has used her original CPAP machine ever since.  In July 2018 the patient was admitted to [facility] after she developed an ulcerative bleed in the upper GI tract.  During the hospitalization she was provided positive airway pressure with a full facemask.  She urgently needs supplies as this mask is not well fitting, she is noting that air leaks, and that she has also noted that her humidifier chamber is empty in the morning.  This is not to be the case when she started the CPAP therapy her machine was issued in the year 2013 and she is definitely due for a new one.  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 Leonie Man in the stroke service which led to her referral for the sleep study.  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   Lives in [place], [place] patient has been a compliant CPAP user and her compliance report for the last 30 days shows 97% compliance with an average time of use of 6 hours and 43 minutes.  Her CPAP is an S9 Elite machine it is set at 11 cmH2O was 1 cm EPR, residual AHI is 3.8 and the air leaks are horrendous.  She is having a median air leak of 69.5 L/min.    Chief complaint according to patient : need a new machine - leaking mask and machine loses water.   Sleep habits are as follows: dinner is around 7.30PM. Afterwards, she reads on an I pad, talks to friends and relatives long distance. Crafts ( making wreaths). She goes to bed at 10-10.30 , she sleeps in a non- adjustable bed. She sleeps on 3-4  Pillows, reclined on her back.  She stays asleep until 2 AM to go to the toilet. She has sometimes a second break around 4 AM. She dreams- but she is not refreshed, restored when waking up at 5.45 AM , rises not earlier than 6.45 AM. She has a dry mouth but no headaches.   Sleep medical history and family sleep history:  No other family member has a diagnosis of OSA.   Social history:  Lives alone, widowed since 3/ 2017.  5 adult children, 4 live close.  No smoker, non ETOH use,   She drinks daily iced tea- [employer]. no coffee, some sodas- she cut down 6 month ago, until then 4-6 sodas a day.  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  Review of Systems: Out of a complete 14 system review, the patient complains of only the following symptoms, and all other reviewed systems are negative.  CHF, ankle edema. SOB, snoring, hoarse ness. SOB with minimal exertion. She endorsed joint pain, feeling easily cold, shortness of breath coughing wheezing and snoring but not on CPAP.  There is some hearing loss and some urinary incontinence and some respiratory allergies.  Past medical history it was already  listed before asthma, knee replacement, pontine stroke, lymphoma, she had cataract surgery 2 C-sections.  Family history is positive for breast cancer in her mother maternal grandfather had throat cancer paternal grandmother had heart had a heart attack as well as 1 of her daughters.  Her mother died at age 17 Father died age 41.    Epworth score 11/07/2022  , Fatigue severity score 54/63 - high fatigue score.  , depression score N/A   Social History   Socioeconomic History  . Marital status: Widowed    Spouse name: Not on file  . Number of children: 5  . Years of education: Not on file  . Highest education level: Not on file  Occupational History  . Occupation: Chemical engineer  Social Needs  . Financial resource strain: Not on file  . Food insecurity:    Worry: Not on file    Inability: Not on file  . Transportation needs:    Medical: Not on file    Non-medical: Not on file  Tobacco Use  . Smoking status: Never Smoker  . Smokeless tobacco: Never Used  Substance and Sexual Activity  . Alcohol use: No  . Drug use: No  . Sexual activity: Not Currently  Lifestyle  . Physical activity:    Days per week: Not on file    Minutes per session: Not on file  . Stress: Not on file  Relationships  . Social connections:    Talks on phone: Not on file    Gets together: Not on file    Attends religious service: Not on file    Active member of club or organization: Not on file    Attends meetings of clubs or organizations: Not on file    Relationship status: Not on file  . Intimate partner violence:    Fear of current or ex partner: Not on file    Emotionally abused: Not on file    Physically abused: Not on file    Forced sexual activity: Not on file  Other Topics Concern  . Not on file  Social History Narrative  . Not on file    Family History  Problem Relation Age of Onset  . Lymphoma Mother   . Breast cancer Mother   . Coronary artery disease Father 10       died  . Stroke  Father   . Colon cancer Maternal Uncle     Past Medical History:  Diagnosis Date  . Anxiety   . Arthritis   . Asthma    hx of  . Back pain   . Chronic constipation   . Depression    hx of  . Dyspnea   . Family history of adverse reaction to anesthesia  DAD bp ELEVATED   . GERD (gastroesophageal reflux disease)   . High cholesterol   . Hypertension   . Lymphoma ([place])   . Obesity   . Sleep apnea    uses a CPAP  . Stroke ([place])    2010  . SVT (supraventricular tachycardia) ([facility])     Past Surgical History:  Procedure Laterality Date  . BACK SURGERY  2009   for lymphoma  . BONE BIOPSY    . CESAREAN SECTION    . ESOPHAGOGASTRODUODENOSCOPY    . ESOPHAGOGASTRODUODENOSCOPY (EGD) WITH PROPOFOL N/A 08/05/2017   Procedure: ESOPHAGOGASTRODUODENOSCOPY (EGD) WITH PROPOFOL;  Surgeon: Doran Stabler, MD;  Location: [facility];  Service: Gastroenterology;  Laterality: N/A;  . EYE SURGERY     cataracts  . LAPAROSCOPY N/A 02/01/2017   Procedure: LAPAROSCOPY DIAGNOSTIC;  Surgeon: Erroll Luna, MD;  Location: [facility];  Service: General;  Laterality: N/A;  . REPLACEMENT TOTAL KNEE     right  . TOTAL ABDOMINAL HYSTERECTOMY  1973    Current Outpatient Medications  Medication Sig Dispense Refill  . acetaminophen (TYLENOL) 500 MG tablet Take 1,000 mg by mouth 2 (two) times daily.     Marland Kitchen albuterol (PROAIR HFA) 108 (90 Base) MCG/ACT inhaler Inhale 2 puffs into the lungs every 6 (six) hours as needed for wheezing or shortness of breath.     Marland Kitchen albuterol (PROVENTIL) (2.5 MG/3ML) 0.083% nebulizer solution Take 2.5 mg by nebulization every 6 (six) hours as needed for wheezing or shortness of breath.    . ASPERCREME LIDOCAINE EX Apply 1 application topically 2 (two) times daily as needed (knee pain).     . budesonide (PULMICORT) 0.25 MG/2ML nebulizer solution Take 0.25 mg by nebulization 2 (two) times daily as needed (shortness of breath/wheezing).    . budesonide-formoterol (SYMBICORT) 160-4.5  MCG/ACT inhaler Inhale 2 puffs into the lungs 2 (two) times daily.    . Fluticasone-Umeclidin-Vilant (TRELEGY ELLIPTA) 100-62.5-25 MCG/INH AEPB Inhale 1 puff into the lungs daily.    . furosemide (LASIX) 40 MG tablet Take 40 mg by mouth daily as needed (for fluid retention.).     Marland Kitchen guaiFENesin (MUCINEX) 600 MG 12 hr tablet Take 600 mg by mouth 2 (two) times daily as needed (for cold symptoms).    . lubiprostone (AMITIZA) 24 MCG capsule Take 24 mcg by mouth daily.     . Menthol, Topical Analgesic, (BIOFREEZE) 4 % GEL Apply 1 application topically 2 (two) times daily as needed (knee pain).     . metoprolol (LOPRESSOR) 50 MG tablet Take 50 mg by mouth 2 (two) times daily.     . montelukast (SINGULAIR) 10 MG tablet Take 10 mg by mouth daily.     . Multiple Vitamin (MULTIVITAMIN WITH MINERALS) TABS tablet Take 1 tablet by mouth daily.    . Olmesartan-Amlodipine-HCTZ (TRIBENZOR) 40-5-12.5 MG TABS Take 1 tablet by mouth daily.      . ondansetron (ZOFRAN-ODT) 4 MG disintegrating tablet Take 4 mg by mouth every 8 (eight) hours as needed for nausea or vomiting.    . potassium chloride SA (K-DUR,KLOR-CON) 20 MEQ tablet Take 20 mEq by mouth at bedtime.     . pravastatin (PRAVACHOL) 20 MG tablet Take 20 mg by mouth daily.      Marland Kitchen PRESCRIPTION MEDICATION Inhale into the lungs at bedtime. CPAP    . promethazine (PHENERGAN) 25 MG tablet Take 25 mg by mouth every 8 (eight) hours as needed for nausea or vomiting.    . senna (  SENOKOT) 8.6 MG TABS tablet Take 4 tablets by mouth daily as needed for mild constipation.     . traMADol (ULTRAM) 50 MG tablet Take 50 mg by mouth 3 (three) times daily as needed for moderate pain.     . Vitamin D, Ergocalciferol, (DRISDOL) 50000 UNITS CAPS Take 50,000 Units by mouth every Monday. In the evening.     No current facility-administered medications for this visit.     Allergies as of 04/27/2018  . (No Known Allergies)    Vitals: BP 140/66   Pulse 79   Ht 5\' 4"  (1.626 m)    Wt 274 lb (124.3 kg)   BMI 47.03 kg/m  Last Weight:  Wt Readings from Last 1 Encounters:  04/27/18 274 lb (124.3 kg)   VZC:HYIF mass index is 47.03 kg/m.     Last Height:   Ht Readings from Last 1 Encounters:  04/27/18 5\' 4"  (1.626 m)    Physical exam:  General: The patient is awake, alert and appears not in acute distress. The patient is well groomed. Head: Normocephalic, atraumatic. Neck is supple. Mallampati 3.  Full dentures.  neck circumference:17.5" . Nasal airflow congested.   Cardiovascular:  Regular rate and rhythm without  murmurs or carotid bruit, and without distended neck veins. Respiratory: wheezing  Skin:  With edema, or rash Trunk: BMI is 47. The patient's posture is stooped   Neurologic exam : The patient is awake and alert, oriented to place and time.   Memory subjective  described as intact.  Attention span & concentration ability appears normal.  Speech is fluent, without dysarthria, dysphonia or aphasia.  Mood and affect are appropriate.  Cranial nerves: Pupils are equal and briskly reactive to light.  Funduscopic exam status post cataract surgery- without evidence of pallor or edema.  Extraocular movements in vertical and horizontal planes intact- there is  Nystagmus under gaze to the left.  Rapid head turning to the left provokes vertigo. Visual fields by finger perimetry are intact. Diplopia. Hearing to finger rub intact.  Facial sensation intact to fine touch. Facial motor strength is symmetric and tongue / uvula move midline. Shoulder shrug was symmetrical.   Motor exam: Normal tone, muscle bulk and symmetric strength in all extremities. Sensory:  Fine touch, pinprick and vibration were tested in all extremities. Proprioception tested in the upper extremities was normal. Coordination: Rapid alternating movements in the fingers/hands was normal. Finger-to-nose maneuver  normal without evidence of ataxia, dysmetria or tremor. Gait and station: Patient  walks without assistive device. Strength within normal limits.  Stance is wide based -stable and normal. Turns with 5 Steps. Romberg testing is positive.  Deep tendon reflexes: in the  upper and lower extremities are symmetric and intact.     Assessment:  After physical and neurologic examination, review of laboratory studies,  Personal review of imaging studies, reports of other /same  Imaging studies, results of polysomnography and / or neurophysiology testing and pre-existing records as far as provided in visit., my assessment is   1) Patient will need ASAP attended sleep study , SPLIT at 20 AHI, and CO2 monitoring.  If she cannot fall asleep without CPAP, start titration and use study as a re-titration.  Orthopnea. May need oxygen, sleeps propped up.   Morbid obesity, asthma,  pontine stroke, CHF, ankle edema.  Residual findings of pontine stroke. Unsteadiness, leaning to the right, rapid turning to the left brings on vertigo, episodic Diplopia.    The patient was advised of the  nature of the diagnosed disorder , the treatment options and the  risks for general health and wellness arising from not treating the condition.   I spent more than 55 minutes of face to face time with the patient.  Greater than 50% of time was spent in counseling and coordination of care. We have discussed the diagnosis and differential and I answered the patient's questions.    Plan:  Treatment plan and additional workup :  SPLIT as described above.   Theresa Seat, MD 7/61/6073, 7:10 PM  Certified in Neurology by ABPN Certified in [place] by [facility] [address], [place], [place] [address]

## 2018-05-18 ENCOUNTER — Ambulatory Visit (INDEPENDENT_AMBULATORY_CARE_PROVIDER_SITE_OTHER): Payer: Medicare Other | Admitting: Neurology

## 2018-05-18 DIAGNOSIS — J4540 Moderate persistent asthma, uncomplicated: Secondary | ICD-10-CM

## 2018-05-18 DIAGNOSIS — I10 Essential (primary) hypertension: Secondary | ICD-10-CM | POA: Diagnosis not present

## 2018-05-18 DIAGNOSIS — M25562 Pain in left knee: Secondary | ICD-10-CM | POA: Diagnosis not present

## 2018-05-18 DIAGNOSIS — J45998 Other asthma: Secondary | ICD-10-CM | POA: Diagnosis not present

## 2018-05-18 DIAGNOSIS — I50814 Right heart failure due to left heart failure: Secondary | ICD-10-CM

## 2018-05-18 DIAGNOSIS — G4733 Obstructive sleep apnea (adult) (pediatric): Secondary | ICD-10-CM | POA: Diagnosis not present

## 2018-05-18 DIAGNOSIS — R7301 Impaired fasting glucose: Secondary | ICD-10-CM | POA: Diagnosis not present

## 2018-05-18 DIAGNOSIS — Z23 Encounter for immunization: Secondary | ICD-10-CM | POA: Diagnosis not present

## 2018-05-18 DIAGNOSIS — I471 Supraventricular tachycardia: Secondary | ICD-10-CM

## 2018-05-18 DIAGNOSIS — Z6841 Body Mass Index (BMI) 40.0 and over, adult: Secondary | ICD-10-CM | POA: Diagnosis not present

## 2018-05-18 DIAGNOSIS — R0601 Orthopnea: Secondary | ICD-10-CM

## 2018-05-18 DIAGNOSIS — G4731 Primary central sleep apnea: Secondary | ICD-10-CM

## 2018-05-18 DIAGNOSIS — I6350 Cerebral infarction due to unspecified occlusion or stenosis of unspecified cerebral artery: Secondary | ICD-10-CM

## 2018-05-23 ENCOUNTER — Telehealth: Payer: Self-pay | Admitting: Neurology

## 2018-05-23 DIAGNOSIS — G4733 Obstructive sleep apnea (adult) (pediatric): Secondary | ICD-10-CM | POA: Insufficient documentation

## 2018-05-23 DIAGNOSIS — R0601 Orthopnea: Secondary | ICD-10-CM | POA: Insufficient documentation

## 2018-05-23 DIAGNOSIS — I6350 Cerebral infarction due to unspecified occlusion or stenosis of unspecified cerebral artery: Secondary | ICD-10-CM | POA: Insufficient documentation

## 2018-05-23 DIAGNOSIS — G4731 Primary central sleep apnea: Secondary | ICD-10-CM | POA: Insufficient documentation

## 2018-05-23 NOTE — Addendum Note (Signed)
Addended by: Larey Seat on: 05/23/2018 05:03 PM   Modules accepted: Orders

## 2018-05-23 NOTE — Telephone Encounter (Signed)
Called patient to discuss sleep study results. No answer at this time. LVM for the patient to call back.   

## 2018-05-23 NOTE — Procedures (Signed)
PATIENT'S NAME:  Theresa, Richardson DOB:      02-Feb-1941      MR#:    [id number]     DATE OF RECORDING: 05/18/2018 REFERRING M.D.:  Leanna Battles MD Study Performed:   Baseline Polysomnogram HISTORY:  [name] is a 77 y.o. female patient, seen in a referral from Dr. Philip Aspen to re-establish sleep care. The patient had been referred by Dr. Leonie Man in January 2013 after a pontine stroke. With a BMI of 46 she underwent on 10-13-2011 a PSG with Capnography, resulting in an AHI of 32/h, RDI of 38/h and CO2 retention. The diagnosis was Complex Apnea and Pickwick - (obesity hypoventilation) syndrome. This was followed by CPAP titration on 11-02-2011, to a final 11 cm water pressure on CPAP, leading to REM sleep rebound. She has used her original CPAP machine ever since. CHF, ankle edema, SOB, morbid obesity, The patient endorsed the Epworth Sleepiness Scale at --- points, FSS at 54/63 points. The patient's weight 273 pounds with a height of 64 (inches), resulting in a BMI of 46.7 kg/m2.The patient's neck circumference measured 17.5 inches.  CURRENT MEDICATIONS: Tylenol, ProAir HFA, Proventil, Aspercreme Lidocaine EX, Pulmicort, Symbicort, Trelegy Ellipta, Lasix, Amitiza, Biofreeze, Lopressor, Singulair, MultiVitamins, Tribenzor, Zofran, K-Dur, Pravachol, Phenergan, Senokot, CPAP, Ultram, Drisdol   PROCEDURE:  This is a multichannel digital polysomnogram utilizing the Somnostar 11.2 system.  Electrodes and sensors were applied and monitored per AASM Specifications.   EEG, EOG, Chin and Limb EMG, were sampled at 200 Hz.  ECG, Snore and Nasal Pressure, Thermal Airflow, Respiratory Effort, CPAP Flow and Pressure, Oximetry was sampled at 50 Hz. Digital video and audio were recorded.      BASELINE STUDY: Lights Out was at 22:00 and Lights On at 04:56.  Total recording time (TRT) was 416 minutes, with a total sleep time (TST) of 336 minutes.   The patient's sleep latency was 36.5 minutes.  REM latency was 350.5 minutes.   The sleep efficiency was 80.8 %.     SLEEP ARCHITECTURE: WASO (Wake after sleep onset) was 61 minutes.  There were 18 minutes in Stage N1, 199.5 minutes Stage N2, 82.5 minutes Stage N3 and 36 minutes in Stage REM.  The percentage of Stage N1 was 5.4%, Stage N2 was 59.4%, Stage N3 was 24.6% and Stage R (REM sleep) was 10.7%.   RESPIRATORY ANALYSIS:  There were a total of 99 respiratory events:  52 obstructive apneas, 0 central apneas and 2 mixed apneas with a total of 54 apneas and an apnea index (AI) of 9.6 /hour. There were 45 hypopneas with a hypopnea index of 8.0 /hour. The patient also had 0 respiratory event related arousals (RERAs).The total APNEA/HYPOPNEA INDEX (AHI) was 17.7 /hour and the total RESPIRATORY DISTURBANCE INDEX was 17.7 /hour.  32 events occurred in REM sleep and 84 events in NREM. The REM AHI was 53.3 /hour, versus a non-REM AHI of 13.4. The patient spent 336 minutes of total sleep time in the supine position and 0 minutes in non-supine. The supine AHI was 17.6/h versus a non-supine AHI of 0.0/h.  OXYGEN SATURATION & C02:  The Wake baseline 02 saturation was 92%, with the lowest being 72%. Time spent below 89% saturation equaled 229 minutes.  Average End Tidal CO2 during sleep was 29.4 torr.  During REM, the average End Tidal CO2 was 38.6 torr.  During NREM, the average End Tidal CO2 was 28.3 torr.  Total sleep time greater than 40 torr was 21.80 minutes. Peak CO2  in sleep was 42 torr.    Arousals/ PERIODIC LIMB MOVEMENTS:  The patient had a total of 0 Periodic Limb Movements. The arousals were noted as: 66 were spontaneous, 0 were associated with PLMs, and 32 were associated with respiratory events. Audio and video analysis did not show any abnormal or unusual movements, behaviors, phonations or vocalizations.  The patient slept on 3 pillows and used 2 liters of oxygen after 2 AM in an effort to reduce prolonged hypoxemia.   Post-study, the patient indicated that sleep was "the  same" as usual.  The patient took bathroom breaks. Mild Snoring was noted. EKG was in keeping with normal sinus rhythm (NSR).  IMPRESSION:  1. Mild Obstructive Sleep Apnea (OSA). 2. Severe, sustained hypoxemia - alleviated under 2 liters of Oxygen  3. Primary Snoring   RECOMMENDATIONS:  1. Advise full-night, attended, CPAP titration study to optimize CPAP therapy and to add oxygen titration. .     I certify that I have reviewed the entire raw data recording prior to the issuance of this report in accordance with the Standards of Accreditation of the American Academy of Sleep Medicine (AASM)   Larey Seat, MD   05-23-2018  Diplomat, American Board of Psychiatry and Neurology  Diplomat, American Board of [place] Director, [place] [facility] [employer]

## 2018-05-26 NOTE — Telephone Encounter (Signed)
Called patient to discuss sleep study results. No answer at this time. LVM for the patient to call back.   2nd attempt to reach patient.

## 2018-05-29 ENCOUNTER — Telehealth: Payer: Self-pay | Admitting: Neurology

## 2018-05-29 NOTE — Telephone Encounter (Signed)
-----   Message from Larey Seat, MD sent at 05/23/2018  5:03 PM EDT ----- IMPRESSION:  1. Mild Obstructive Sleep Apnea (OSA). 2. Severe, sustained hypoxemia - alleviated under 2 liters of  Oxygen- incomplete resolution of hypoxemia.   3. Primary Snoring   RECOMMENDATIONS:  1. Advise full-night, attended, CPAP titration study to optimize  CPAP therapy and to add oxygen titration. Marland Kitchen

## 2018-05-29 NOTE — Telephone Encounter (Signed)
I called pt back at the number that was provided, [phone]. I advised pt that Dr. Brett Fairy reviewed their sleep study results and found that has mild sleep apnea with hypoxemia and recommends that pt be treated with a cpap. Dr. Brett Fairy recommends that pt return for a repeat sleep study in order to properly titrate the cpap and ensure a good mask fit. Pt is agreeable to returning for a titration study. I advised pt that our sleep lab will file with pt's insurance and call pt to schedule the sleep study when we hear back from the pt's insurance regarding coverage of this sleep study. Pt verbalized understanding of results. Patient states that she can be called at the number mentioned above of the home and work number that is on file  Pt had no questions at this time but was encouraged to call back if questions arise.

## 2018-05-29 NOTE — Telephone Encounter (Signed)
Patient called back and requested a return call to [phone].

## 2018-06-08 ENCOUNTER — Ambulatory Visit (INDEPENDENT_AMBULATORY_CARE_PROVIDER_SITE_OTHER): Payer: Medicare Other | Admitting: Neurology

## 2018-06-08 DIAGNOSIS — I50814 Right heart failure due to left heart failure: Secondary | ICD-10-CM

## 2018-06-08 DIAGNOSIS — R0902 Hypoxemia: Secondary | ICD-10-CM

## 2018-06-08 DIAGNOSIS — G4731 Primary central sleep apnea: Secondary | ICD-10-CM

## 2018-06-08 DIAGNOSIS — J4540 Moderate persistent asthma, uncomplicated: Secondary | ICD-10-CM

## 2018-06-08 DIAGNOSIS — I471 Supraventricular tachycardia: Secondary | ICD-10-CM

## 2018-06-08 DIAGNOSIS — R0601 Orthopnea: Secondary | ICD-10-CM

## 2018-06-08 DIAGNOSIS — I6350 Cerebral infarction due to unspecified occlusion or stenosis of unspecified cerebral artery: Secondary | ICD-10-CM

## 2018-06-14 ENCOUNTER — Telehealth: Payer: Self-pay

## 2018-06-14 DIAGNOSIS — I50814 Right heart failure due to left heart failure: Secondary | ICD-10-CM | POA: Insufficient documentation

## 2018-06-14 DIAGNOSIS — J4540 Moderate persistent asthma, uncomplicated: Secondary | ICD-10-CM | POA: Insufficient documentation

## 2018-06-14 NOTE — Addendum Note (Signed)
Addended by: Larey Seat on: 06/14/2018 11:21 AM   Modules accepted: Orders

## 2018-06-14 NOTE — Telephone Encounter (Signed)
-----   Message from Larey Seat, MD sent at 06/14/2018 11:21 AM EST ----- No optimal pressure was identified, and oxygen need could not be ruled out. Oxygen qualification remains in question.  Will return for a CPAP titration ,beginning at 15 cm water and titrate to oxygen if AHI is below 5/h. Switch to BiPAP as needed. I ordered a BiPAP titration.  DIAGNOSIS 1. Sleep Related Hypoxemia with OSA- an optimal pressure was not  documented for this patient, her last explored CPAP pressure was  applied for too short a time and neither was REM sleep noted. 2. The best tolerated pressures of 9 and 10 cm CPAP allowed for  AHI of 5.7 and 5.1/h but nadir 02 was 85%. It is still not  determined if the patient needs additional o2 or not.    PLANS/RECOMMENDATIONS: 1. Return for BiPAP titration- please add 02 if hypoxemia  persists.

## 2018-06-14 NOTE — Procedures (Signed)
PATIENT'S NAME:  [name], [name] DOB:      28-Jan-1941      MR#:    [id number]     DATE OF RECORDING: 06/08/2018 REFERRING M.D.:  Leanna Battles MD Study Performed:   Titration to Positive Airway Pressure. HISTORY:  [name], a 77 year old female, returns for a CPAP titration after her baseline study from 05/18/18 showed an AHI of 17.7/h, REM AHI of 53.3/h, Supine AHI of 17.6, and a total desaturation time of 229 minutes (!) with an O2 nadir of 72%. The diagnosis of moderate OSA with severe hypoxemia, possibly hypoventilation, was made. 2 liters of oxygen were added.  The patient endorsed the Epworth Sleepiness Scale at 3/24 points. The patient's weight 273 pounds with a height of 64 (inches), resulting in a BMI of 46.7 kg/m2. The patient's neck circumference measured 17 inches.  CURRENT MEDICATIONS: Tylenol, ProAir HFA, Proventil, Asper crme Lidocaine EX, Pulmicort, Symbicort, Trilogy Ellipta, Lasix, Amitiza, Bio freeze, Lopressor, Singulair, Multivitamins, Tribenzor, Zofran, K-Dur, Pravachol, Phenergan, Senokot, CPAP, Ultram, Drisdol   PROCEDURE:  This is a multichannel digital polysomnogram utilizing the SomnoStar 11.2 system.  Electrodes and sensors were applied and monitored per AASM Specifications.   EEG, EOG, Chin and Limb EMG, were sampled at 200 Hz.  ECG, Snore and Nasal Pressure, Thermal Airflow, Respiratory Effort, CPAP Flow and Pressure, Oximetry was sampled at 50 Hz. Digital video and audio were recorded.      CPAP was initiated at 5 cmH20 with heated humidity per AASM split night standards and use of a ResMed Airtouch F 20 medium interface. The pressure was advanced to 15 cmH20 because of hypopneas, apneas and desaturations.  At a PAP pressure of 15 cmH20, there was a reduction of the AHI to 0 with improvement of sleep apnea, Nadir was 90% but overall sleep time was only 16 minutes without REM sleep.  Lights Out was at 22:22 and Lights On at 05:04. Total recording time (TRT) was 402.5  minutes, with a total sleep time (TST) of 335.5 minutes. The patient's sleep latency was 25 minutes. REM latency was 260 minutes.  The sleep efficiency was 83.4 %.    SLEEP ARCHITECTURE: WASO (Wake after sleep onset) was 41 minutes.  There were 5 minutes in Stage N1, 222 minutes Stage N2, 104.5 minutes Stage N3, and 4 minutes in Stage REM.  The percentage of Stage N1 was 1.5%, Stage N2 was 66.2%, Stage N3 was 31.1% and Stage R (REM sleep) was 1.2%. The sleep architecture was notable for extreme REM latency. The arousals were noted as: 27 were spontaneous, 0 were associated with PLMs, and 9 were associated with respiratory events.     RESPIRATORY ANALYSIS:  There was a total of 61 respiratory events: 48 obstructive apneas, 2 central apneas and 0 mixed apneas with a total of 50 apneas and an apnea index (AI) of 8.9 /hour. There were 11 hypopneas with a hypopnea index of 2.0/hour. The patient also had 0 respiratory event related arousals (RERAs).      The total APNEA/HYPOPNEA INDEX (AHI) was still 10.9 /hour.  4 events occurred in REM sleep and 57 events in NREM. The REM AHI was 60.0 /hour versus a non-REM AHI of 10.3 /hour.  The patient spent 335.5 minutes of total sleep time in the supine position and 0 minutes in non-supine. The supine AHI was 10.9/h, versus a non-supine AHI of 0.0.  OXYGEN SATURATION & C02:  The baseline 02 saturation was 94%, with the lowest being  74%. Time spent below 89% saturation equaled 52 minutes- .  PERIODIC LIMB MOVEMENTS:  The patient had a total of 0 Periodic Limb Movements. The arousals were noted as: 27 were spontaneous, 0 were associated with PLMs, and 9 were associated with respiratory events.  Audio and video analysis did not show any abnormal or unusual movements, behaviors, phonations or vocalizations.     The patient took bathroom breaks. Snoring was alleviated. Post-study, the patient indicated that sleep was the same as usual.    DIAGNOSIS 1. Sleep  Related Hypoxemia with OSA- an optimal pressure was not documented for this patient, her last explored CPAP pressure was applied for too short a time and neither was REM sleep noted. 2. The best tolerated pressures of 9 and 10 cm CPAP allowed for AHI of 5.7 and 5.1/h but nadir 02 was 85%. It is still not determined if the patient needs additional o2 or not.     PLANS/RECOMMENDATIONS: 1. Return for BiPAP titration- please add 02 if hypoxemia persists.   A follow up appointment will be scheduled in the Sleep Clinic at [facility].   Please call [phone] with any questions.      I certify that I have reviewed the entire raw data recording prior to the issuance of this report in accordance with the Standards of Accreditation of the American Academy of Sleep Medicine (AASM)   Larey Seat, M.D. 06-14-2018  Diplomat, American Board of Psychiatry and Neurology  Diplomat, [place] of Sleep Medicine Medical Director, [place] [facility]

## 2018-06-14 NOTE — Telephone Encounter (Signed)
I called pt. I advised pt that Dr. Brett Fairy reviewed their sleep study results and found that no optimal pressure was identified during her latest sleep study and pt had low oxygen and recommends that pt be treated with a cpap or bipap. Dr. Brett Fairy recommends that pt return for a repeat sleep study in order to properly titrate the cpap/bipap further and ensure a good mask fit.Pt may need additional O2 if the hypoxemia is not corrected with PAP therapy. REM sleep was not noted during the last explored cpap pressure. Pt is agreeable to returning for a titration study. I advised pt that our sleep lab will file with pt's insurance and call pt to schedule the sleep study when we hear back from the pt's insurance regarding coverage of this sleep study. Pt verbalized understanding of results. Pt had no questions at this time but was encouraged to call back if questions arise.

## 2018-06-19 ENCOUNTER — Institutional Professional Consult (permissible substitution): Payer: Self-pay | Admitting: Neurology

## 2018-07-28 ENCOUNTER — Ambulatory Visit (INDEPENDENT_AMBULATORY_CARE_PROVIDER_SITE_OTHER): Payer: Medicare Other | Admitting: Neurology

## 2018-07-28 DIAGNOSIS — G4710 Hypersomnia, unspecified: Secondary | ICD-10-CM

## 2018-07-28 DIAGNOSIS — I471 Supraventricular tachycardia: Secondary | ICD-10-CM

## 2018-07-28 DIAGNOSIS — G4731 Primary central sleep apnea: Secondary | ICD-10-CM | POA: Diagnosis not present

## 2018-07-28 DIAGNOSIS — I6350 Cerebral infarction due to unspecified occlusion or stenosis of unspecified cerebral artery: Secondary | ICD-10-CM

## 2018-07-28 DIAGNOSIS — I50814 Right heart failure due to left heart failure: Secondary | ICD-10-CM

## 2018-07-28 DIAGNOSIS — G4730 Sleep apnea, unspecified: Secondary | ICD-10-CM

## 2018-07-28 DIAGNOSIS — R0902 Hypoxemia: Secondary | ICD-10-CM

## 2018-07-28 DIAGNOSIS — R0601 Orthopnea: Secondary | ICD-10-CM

## 2018-08-11 ENCOUNTER — Telehealth: Payer: Self-pay | Admitting: Neurology

## 2018-08-11 DIAGNOSIS — R0902 Hypoxemia: Secondary | ICD-10-CM | POA: Insufficient documentation

## 2018-08-11 NOTE — Procedures (Signed)
PATIENT'S NAME:  [name], [name] DOB:      06/06/1941      MR#:    [id number]     DATE OF RECORDING: 07/28/2018 C.Inge Rise M.D.:  Antony Contras, MD and Leanna Battles, MD Study Performed:   Re-Titration to positive airway pressure  Patient has used CPAP since 2013, machine is now broken. Patient has CHF, returning for CPAP/BIPAP titration after having a CPAP Titration study on 06/08/18. Patient reached a pressure of 10cm/h2o with a residual AHI of 5.7/h, and an oxygen nadir of 85%. The total desaturation time was 229 minutes below 89% sPO2 ! Sleep Related Hypoxemia with OSA- an optimal pressure was not documented for this patient, her last explored CPAP pressure was applied for too short a time and neither was REM sleep noted. The best tolerated pressures of 9 and 10 cm CPAP allowed for AHI of 5.7 and 5.1/h but nadir 02 was 85% while on Oxygen at 2 liters. It is still not determined if the patient needs additional o2 or not.  This re-titration should start at 10 cm water and may change to BiPAP modality if needed, o2 added if desaturation is not resolving with AHI.  The patient endorsed the Epworth Sleepiness Scale at 3/24 points.   The patient's weight 274 pounds with a height of 64 (inches), resulting in a BMI of 46.7 kg/m2.   CURRENT MEDICATIONS: Tylenol, ProAir HFA, Proventil, Asper crme Lidocaine EX, Pulmicort, Symbicort, Trilegy, Ellipta, Lasix, Amitiza, Bio freeze, Lopressor, Singulair, Multivitamins, Tribenzor, Zofran, K-Dur, Pravachol, Phenergan, Senokot, CPAP, Ultram, Drisdol  PROCEDURE:  This is a multichannel digital polysomnogram utilizing the SomnoStar 11.2 system.  Electrodes and sensors were applied and monitored per AASM Specifications.   EEG, EOG, Chin and Limb EMG, were sampled at 200 Hz.  ECG, Snore and Nasal Pressure, Thermal Airflow, Respiratory Effort, CPAP Flow and Pressure, Oximetry was sampled at 50 Hz. Digital video and audio were recorded.      CPAP was initiated at 15  cmH20 ( AHI 5.2/h. and nadir 86%)  with heated humidity per AASM split night standards and pressure was not further advanced , but technologist switched to BiPAP 16/12cmH20 because of hypopneas, apneas and desaturations. Highest explored pressure on BIPAP was 23/22 cmH20, there was an exacerbation of the AHI to 13.3/h with improvement only of oxygen nadir, not of sleep apnea. The patient was given a Respironics F20 FF-Mask in medium. The patient did best at CPAP 15 cm water.    Lights Out was at 22:14 and Lights On at 04:59. Total recording time (TRT) was 406 minutes, with a total sleep time (TST) of 342 minutes. The patient's sleep latency was 26.5 minutes. REM latency was 0 minutes.  The sleep efficiency was 84.2 %.    SLEEP ARCHITECTURE: WASO (Wake after sleep onset) was 32 minutes.  There were 2.5 minutes in Stage N1, 339.5 minutes Stage N2, but 0 minutes Stage N3, and 0 minutes in Stage REM.   The percentage of Stage N1 was 0.7%, Stage N2 was 99.3%, Stage N3 was 0% and Stage R (REM sleep) was 0%.  RESPIRATORY ANALYSIS:  There was a total of 101 respiratory events: 45 obstructive apneas, 1 central apnea and 55 hypopneas with several respiratory event related arousals (RERAs).      The total APNEA/HYPOPNEA INDEX (AHI) was 17.7 /hour and the total RESPIRATORY DISTURBANCE INDEX was 17.7 /hour  0 events occurred in REM sleep and 101 events in NREM. The REM AHI was 0 /hour  versus a non-REM AHI of 17.7 /hour. The patient spent 342 minutes of total sleep time in the supine position and 0 minutes in non-supine. The supine AHI was 17.7, versus a non-supine AHI of 0.0.  OXYGEN SATURATION & C02:  The baseline 02 saturation was 92%, with the lowest being 83%. Time spent below 89% saturation equaled 68 minutes.  PERIODIC LIMB MOVEMENTS:  The patient had a total of 0 Periodic Limb Movements.   Audio and video analysis did not show any abnormal or unusual movements, behaviors, phonations or vocalizations.   No  nocturia.  Snoring was no longer noted. EKG was in keeping with normal sinus rhythm. Post-study, the patient indicated that sleep was the same as usual.    DIAGNOSIS 1. Obstructive Sleep Apnea with hypoxemia improved on CPAP 15 cm water better than any BiPAP settings.  2. No Central Sleep Apnea emerged   3. Sleep Related Hypoxemia improved  PLANS/RECOMMENDATIONS: I will order an auto CPAP setting with 3 cm EPR, between 9 and 18 cm water pressure.   1. The patient should avoid evening sedatives, hypnotics, and alcohol beverage consumption 2. CPAP therapy compliance is defined as 4 hours of nightly use.  A follow up appointment will be scheduled in the Sleep Clinic at [facility].   Please call [phone] with any questions.      I certify that I have reviewed the entire raw data recording prior to the issuance of this report in accordance with the Standards of Accreditation of the American Academy of Sleep Medicine (AASM)     Larey Seat, M.D.   08-11-2018 Diplomat, American Board of Psychiatry and Neurology  Diplomat, [place] of Sleep Medicine Medical Director, [place] [facility] at [employer]

## 2018-08-11 NOTE — Telephone Encounter (Signed)
Called patient to discuss sleep study results. No answer at this time. LVM for the patient to call back.  Advised the patient office will open on Monday and to call back.

## 2018-08-11 NOTE — Telephone Encounter (Signed)
-----   Message from Larey Seat, MD sent at 08/11/2018 11:01 AM EST ----- DIAGNOSIS 1. Obstructive Sleep Apnea with hypoxemia improved on CPAP 15 cm  water better than any BiPAP settings.  2. No Central Sleep Apnea emerged  3. Sleep Related Hypoxemia improved  PLANS/RECOMMENDATIONS: I will order an auto CPAP setting with 3  cm EPR, between 9 and 18 cm water pressure.   1. The patient should avoid sedatives, hypnotics, and  alcoholic beverage consumption within 2 hours of bed time.  2. CPAP therapy compliance is defined as 4 hours or more of nightly use.  A follow up appointment will be scheduled in the Sleep Clinic at  [facility].  Please call [phone] with  any questions.

## 2018-08-11 NOTE — Addendum Note (Signed)
Addended by: Larey Seat on: 08/11/2018 11:02 AM   Modules accepted: Orders

## 2018-08-14 DIAGNOSIS — Z803 Family history of malignant neoplasm of breast: Secondary | ICD-10-CM | POA: Diagnosis not present

## 2018-08-14 DIAGNOSIS — Z981 Arthrodesis status: Secondary | ICD-10-CM | POA: Diagnosis not present

## 2018-08-14 DIAGNOSIS — Z90710 Acquired absence of both cervix and uterus: Secondary | ICD-10-CM | POA: Diagnosis not present

## 2018-08-14 DIAGNOSIS — Z1231 Encounter for screening mammogram for malignant neoplasm of breast: Secondary | ICD-10-CM | POA: Diagnosis not present

## 2018-08-14 DIAGNOSIS — M8589 Other specified disorders of bone density and structure, multiple sites: Secondary | ICD-10-CM | POA: Diagnosis not present

## 2018-08-14 DIAGNOSIS — Z8579 Personal history of other malignant neoplasms of lymphoid, hematopoietic and related tissues: Secondary | ICD-10-CM | POA: Diagnosis not present

## 2018-08-14 DIAGNOSIS — Z96651 Presence of right artificial knee joint: Secondary | ICD-10-CM | POA: Diagnosis not present

## 2018-08-14 DIAGNOSIS — Z8262 Family history of osteoporosis: Secondary | ICD-10-CM | POA: Diagnosis not present

## 2018-08-15 NOTE — Telephone Encounter (Signed)
Called a 2nd time to discuss. This time there wasn't a answering machine and no answer. Was unable to leave a message. Will attempt again later.

## 2018-08-17 DIAGNOSIS — I10 Essential (primary) hypertension: Secondary | ICD-10-CM | POA: Diagnosis not present

## 2018-08-17 DIAGNOSIS — R7301 Impaired fasting glucose: Secondary | ICD-10-CM | POA: Diagnosis not present

## 2018-08-17 DIAGNOSIS — M25562 Pain in left knee: Secondary | ICD-10-CM | POA: Diagnosis not present

## 2018-08-17 DIAGNOSIS — Z6841 Body Mass Index (BMI) 40.0 and over, adult: Secondary | ICD-10-CM | POA: Diagnosis not present

## 2018-08-17 DIAGNOSIS — G4733 Obstructive sleep apnea (adult) (pediatric): Secondary | ICD-10-CM | POA: Diagnosis not present

## 2018-08-17 DIAGNOSIS — R0602 Shortness of breath: Secondary | ICD-10-CM | POA: Diagnosis not present

## 2018-08-24 ENCOUNTER — Telehealth: Payer: Self-pay | Admitting: Neurology

## 2018-08-24 NOTE — Telephone Encounter (Signed)
Will send a letter to the patient asking her to contact our office.

## 2018-08-30 NOTE — Telephone Encounter (Signed)
Patient returned call I advised pt that Dr. Brett Fairy reviewed their sleep study results and found that pt was able to tolerate the CPAP pressure at 15 cm water pressure. Dr. Brett Fairy recommends that pt start auto CPAP. I reviewed PAP compliance expectations with the pt. Pt is agreeable to starting a CPAP. I advised pt that an order will be sent to a DME, [facility], and [facility] will call the pt within about one week after they file with the pt's insurance. [facility] will show the pt how to use the machine, fit for masks, and troubleshoot the CPAP if needed. A follow up appt was made for insurance purposes with Cecille Rubin, NP on March 23,2020 at 3:45 pm. Pt verbalized understanding to arrive 15 minutes early and bring their CPAP. A letter with all of this information in it will be mailed to the pt as a reminder. I verified with the pt that the address we have on file is correct. Pt verbalized understanding of results. Pt had no questions at this time but was encouraged to call back if questions arise. I have sent the order to [facility] and have received confirmation that they have received the order.

## 2018-08-31 DIAGNOSIS — R0602 Shortness of breath: Secondary | ICD-10-CM | POA: Diagnosis not present

## 2018-08-31 DIAGNOSIS — G4733 Obstructive sleep apnea (adult) (pediatric): Secondary | ICD-10-CM | POA: Diagnosis not present

## 2018-08-31 DIAGNOSIS — I10 Essential (primary) hypertension: Secondary | ICD-10-CM | POA: Diagnosis not present

## 2018-08-31 DIAGNOSIS — Z6841 Body Mass Index (BMI) 40.0 and over, adult: Secondary | ICD-10-CM | POA: Diagnosis not present

## 2018-08-31 DIAGNOSIS — J45909 Unspecified asthma, uncomplicated: Secondary | ICD-10-CM | POA: Diagnosis not present

## 2018-09-29 DIAGNOSIS — J45901 Unspecified asthma with (acute) exacerbation: Secondary | ICD-10-CM | POA: Diagnosis not present

## 2018-09-29 DIAGNOSIS — R05 Cough: Secondary | ICD-10-CM | POA: Diagnosis not present

## 2018-09-29 DIAGNOSIS — Z6841 Body Mass Index (BMI) 40.0 and over, adult: Secondary | ICD-10-CM | POA: Diagnosis not present

## 2018-10-05 DIAGNOSIS — C8330 Diffuse large B-cell lymphoma, unspecified site: Secondary | ICD-10-CM | POA: Diagnosis not present

## 2018-10-05 DIAGNOSIS — J45909 Unspecified asthma, uncomplicated: Secondary | ICD-10-CM | POA: Insufficient documentation

## 2018-10-05 DIAGNOSIS — C8338 Diffuse large B-cell lymphoma, lymph nodes of multiple sites: Secondary | ICD-10-CM | POA: Diagnosis not present

## 2018-10-09 DIAGNOSIS — M25562 Pain in left knee: Secondary | ICD-10-CM | POA: Diagnosis not present

## 2018-10-09 DIAGNOSIS — Z6841 Body Mass Index (BMI) 40.0 and over, adult: Secondary | ICD-10-CM | POA: Diagnosis not present

## 2018-10-09 DIAGNOSIS — I10 Essential (primary) hypertension: Secondary | ICD-10-CM | POA: Diagnosis not present

## 2018-10-23 ENCOUNTER — Ambulatory Visit: Payer: Self-pay | Admitting: Nurse Practitioner

## 2019-01-17 ENCOUNTER — Telehealth: Payer: Self-pay

## 2019-01-17 NOTE — Telephone Encounter (Signed)
LEft vm for patient that her visit will be change to mychart video visit due to COVID 19.

## 2019-01-18 NOTE — Telephone Encounter (Signed)
I called pt that her appt is being change to mychart video due to COVID 19. I text her the mychart link to her cell phone.She gave me verbal consent to do video and to file insurance. I explain the mychart process and to create account and know her username and password. Pt knows to log onto account 10 minutes early and click link for video. I updated meds, pcp and pharmacy. PT verbalized understanding.

## 2019-01-22 ENCOUNTER — Encounter (INDEPENDENT_AMBULATORY_CARE_PROVIDER_SITE_OTHER): Payer: Self-pay | Admitting: Adult Health

## 2019-01-22 NOTE — Progress Notes (Signed)
Opened in error; Disregard.

## 2019-01-22 NOTE — Telephone Encounter (Addendum)
mychart video km initial CPAP 01-22-2019 Pt has called and gave verbal consent to file insur for Doxy[employer]me vv Text being sent to cell  Pt understands that although there may be some limitations with this type of visit, we will take all precautions to reduce any security or privacy concerns.  Pt understands that this will be treated like an in office visit and we will file with pt's insurance, and there may be a patient responsible charge related to this service. Text message sent to [phone] [employer]

## 2019-01-23 NOTE — Telephone Encounter (Signed)
Noted! Thank you

## 2019-01-24 ENCOUNTER — Encounter: Payer: Self-pay | Admitting: Family Medicine

## 2019-01-24 ENCOUNTER — Ambulatory Visit (INDEPENDENT_AMBULATORY_CARE_PROVIDER_SITE_OTHER): Payer: Medicare Other | Admitting: Family Medicine

## 2019-01-24 ENCOUNTER — Other Ambulatory Visit: Payer: Self-pay

## 2019-01-24 DIAGNOSIS — G4731 Primary central sleep apnea: Secondary | ICD-10-CM | POA: Diagnosis not present

## 2019-01-24 NOTE — Progress Notes (Signed)
PATIENT: [name] DOB: 1940-10-21  REASON FOR VISIT: follow up HISTORY FROM: patient  Virtual Visit via Telephone Note  I connected with [name] on 01/24/19 at  1:30 PM EDT by telephone and verified that I am speaking with the correct person using two identifiers.   I discussed the limitations, risks, security and privacy concerns of performing an evaluation and management service by telephone and the availability of in person appointments. I also discussed with the patient that there may be a patient responsible charge related to this service. The patient expressed understanding and agreed to proceed.   History of Present Illness:  01/24/19 [name] is a 78 y.o. 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  She reports that she is doing very well with CPAP therapy.  She states that she cannot sleep without her machine.  Compliance data dated 12/24/2018 through 01/22/2019 reveals that she is using her machine every day for compliance of 100%.  Every day she used her machine greater than 4 hours.  Average usage was 7 hours and 53 minutes.  AHI was elevated at 7.1 on 4 to 20 cm of water and an EPR of 1.  Review of pressures she is frequently reaching 20 cm of water. She is here today for follow-up.  History (copied from Dr Dohmeier's note on 04/27/2018)  HPI:  [name] is a 78 y.o. female , seen here as in a referral from Dr. Philip Aspen to re-establish sleep care. The patient had been referred by Dr Leonie Man in January 2013, with a BMI of 46 /h underwent on 10-13-2011 PSG with Capnography, resulting in an AHI of 32/ h, RDI of 38/h and CO2 retention. 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 This was followed by CPAP titration on 11-02-2011, to a final 11 cm water with REM sleep rebound. She has used her original CPAP machine ever since.  In July 2018 the patient was admitted to [facility] after she developed an  ulcerative bleed in the upper GI tract.  During the hospitalization she was provided positive airway pressure with a full facemask.  She urgently needs supplies as this mask is not well fitting, she is noting that air leaks, and that she has also noted that her humidifier chamber is empty in the morning.  This is not to be the case when she started the CPAP therapy her machine was issued in the year 2013 and she is definitely due for a new one.  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 Leonie Man in the stroke service which led to her referral for the sleep study.  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   Lives in [place], [place] patient has been a compliant CPAP user and her compliance report for the last 30 days shows 97% compliance with an average time of use of 6 hours and 43 minutes.  Her CPAP is an S9 Elite machine it is set at 11 cmH2O was 1 cm EPR, residual AHI is 3.8 and the air leaks are horrendous.  She is having a median air leak of 69.5 L/min.  Chief complaint according to patient : need a new machine - leaking mask and machine loses water.   Sleep habits are as follows: dinner is around 7.30PM. Afterwards, she reads on an I pad, talks to friends and relatives long distance. Crafts ( making wreaths). She goes to bed at 10-10.30 , she sleeps in a non- adjustable bed. She sleeps on 3-4  Pillows, reclined on her back.  She stays asleep until 2 AM to go to the toilet. She has sometimes a second break around 4 AM. She dreams- but she is not refreshed, restored when waking up at 5.45 AM , rises not earlier than 6.45 AM. She has a dry mouth but no  headaches.   Sleep medical history and family sleep history:  No other family member has a diagnosis of OSA.   Social history:  Lives alone, widowed since 3/ 2017. 5 adult children, 4 live close.  No smoker, non ETOH use,   She drinks daily iced tea- [employer]. no coffee, some sodas- she cut down 6 month ago, until then 4-6 sodas a day.  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    Observations/Objective:  Generalized: Well developed, in no acute distress  Mentation: Alert oriented to time, place, history taking. Follows all commands speech and language fluent   Assessment and Plan:  78 y.o. year old female  has a past medical history of Anxiety, Arthritis, Asthma, Back pain, Chronic constipation, Depression, Dyspnea, Family history of adverse reaction to anesthesia, GERD (gastroesophageal reflux disease), High cholesterol, Hypertension, Lymphoma ([place]), Obesity, Sleep apnea, Stroke ([place]), and SVT (supraventricular tachycardia) ([place]). here with    ICD-10-CM   1. Complex sleep apnea syndrome  G47.31 For home use only DME continuous positive airway pressure (CPAP)   [name] continues to do very well on CPAP therapy.  Download report reveals excellent compliance.  Unfortunately, AHI remains elevated at 7.1.  We will adjust maximum pressure to 22 cm of water.  Order has been sent to her DME company.  She is aware of this adjustment.  We will request a download in 1 month to assess AHI.  Will anticipate annual follow-up pending her compliance download in 1 month.  She verbalizes understanding and agreement with this plan.  Orders Placed This Encounter  Procedures  . For home use only DME continuous positive airway pressure (CPAP)    Please adjust pressure settings to the following: Minimum pressure 4cmH20, Maximum pressure 22cmH20. EPR 1.    Order Specific Question:   Length of Need    Answer:   Lifetime    Order Specific Question:   Patient has OSA or probable  OSA    Answer:   Yes    Order Specific Question:   Is the patient currently using CPAP in the home    Answer:   Yes    Order Specific Question:   Settings    Answer:   Other see comments    Order Specific Question:   CPAP supplies needed    Answer:   Mask, headgear, cushions, filters, heated tubing and water chamber    No orders of the defined types were placed in this encounter.    Follow Up Instructions:  I discussed the assessment and treatment plan with the patient. The patient was provided an opportunity to ask questions and all were answered. The patient agreed with the plan and demonstrated an understanding of the instructions.   The patient was advised  to call back or seek an in-person evaluation if the symptoms worsen or if the condition fails to improve as anticipated.  I provided 15 minutes of non-face-to-face time during this encounter.  Patient is located at her place of residence during video conference.  Provider located in the office.  [place] CMA helped to facilitate visit.   Debbora Presto, NP

## 2019-01-29 DIAGNOSIS — E7849 Other hyperlipidemia: Secondary | ICD-10-CM | POA: Diagnosis not present

## 2019-01-29 DIAGNOSIS — M810 Age-related osteoporosis without current pathological fracture: Secondary | ICD-10-CM | POA: Diagnosis not present

## 2019-01-29 DIAGNOSIS — I10 Essential (primary) hypertension: Secondary | ICD-10-CM | POA: Diagnosis not present

## 2019-01-29 DIAGNOSIS — J45901 Unspecified asthma with (acute) exacerbation: Secondary | ICD-10-CM | POA: Diagnosis not present

## 2019-01-29 DIAGNOSIS — M25562 Pain in left knee: Secondary | ICD-10-CM | POA: Diagnosis not present

## 2019-01-31 ENCOUNTER — Other Ambulatory Visit: Payer: Self-pay | Admitting: Neurology

## 2019-01-31 ENCOUNTER — Telehealth: Payer: Self-pay | Admitting: Neurology

## 2019-01-31 DIAGNOSIS — G4731 Primary central sleep apnea: Secondary | ICD-10-CM

## 2019-01-31 NOTE — Telephone Encounter (Signed)
Received a notification from [employer] indicating a different pressure setting is needed on machine because the CPAP maxiumum doesn't go above 20 cm. We initially thought we would need to bring the patient in for titration study but after talking with the patient it was brought to our attention the patient had already completed the bipap titration and tolerated the cpap better then the bipap and that was why Dr Brett Fairy ordered auto cpap for the patient. Advised the patient since she is having mostly 7 apnea events and maxing to 19 on the machine the next step would be after discussing with sleep lab RT manager is that we could try setting her machine at a set pressure vs the auto pressure. Pt verbalized understanding. Advised order was sent and we will have them forward a download after 30 days on the new pressure setting to see how well the patient is tolerating it. Pt verbalized understanding and was appreciative with the follow up.

## 2019-02-15 DIAGNOSIS — Z0000 Encounter for general adult medical examination without abnormal findings: Secondary | ICD-10-CM | POA: Diagnosis not present

## 2019-02-15 DIAGNOSIS — F418 Other specified anxiety disorders: Secondary | ICD-10-CM | POA: Diagnosis not present

## 2019-02-15 DIAGNOSIS — R7301 Impaired fasting glucose: Secondary | ICD-10-CM | POA: Diagnosis not present

## 2019-02-15 DIAGNOSIS — I872 Venous insufficiency (chronic) (peripheral): Secondary | ICD-10-CM | POA: Diagnosis not present

## 2019-02-15 DIAGNOSIS — E785 Hyperlipidemia, unspecified: Secondary | ICD-10-CM | POA: Diagnosis not present

## 2019-02-15 DIAGNOSIS — G4733 Obstructive sleep apnea (adult) (pediatric): Secondary | ICD-10-CM | POA: Diagnosis not present

## 2019-02-15 DIAGNOSIS — M25562 Pain in left knee: Secondary | ICD-10-CM | POA: Diagnosis not present

## 2019-02-15 DIAGNOSIS — I10 Essential (primary) hypertension: Secondary | ICD-10-CM | POA: Diagnosis not present

## 2019-03-28 ENCOUNTER — Encounter (HOSPITAL_COMMUNITY): Payer: Self-pay | Admitting: Emergency Medicine

## 2019-03-28 ENCOUNTER — Other Ambulatory Visit: Payer: Self-pay

## 2019-03-28 ENCOUNTER — Inpatient Hospital Stay (HOSPITAL_COMMUNITY)
Admission: EM | Admit: 2019-03-28 | Discharge: 2019-04-04 | DRG: 871 | Disposition: A | Discharge status: Home Health | Payer: Medicare Other | Attending: Internal Medicine | Admitting: Internal Medicine

## 2019-03-28 ENCOUNTER — Emergency Department (HOSPITAL_COMMUNITY): Payer: Medicare Other

## 2019-03-28 DIAGNOSIS — N390 Urinary tract infection, site not specified: Secondary | ICD-10-CM | POA: Diagnosis not present

## 2019-03-28 DIAGNOSIS — R571 Hypovolemic shock: Secondary | ICD-10-CM | POA: Diagnosis present

## 2019-03-28 DIAGNOSIS — Z20828 Contact with and (suspected) exposure to other viral communicable diseases: Secondary | ICD-10-CM | POA: Diagnosis present

## 2019-03-28 DIAGNOSIS — I509 Heart failure, unspecified: Secondary | ICD-10-CM | POA: Diagnosis present

## 2019-03-28 DIAGNOSIS — F329 Major depressive disorder, single episode, unspecified: Secondary | ICD-10-CM | POA: Diagnosis present

## 2019-03-28 DIAGNOSIS — Z7982 Long term (current) use of aspirin: Secondary | ICD-10-CM

## 2019-03-28 DIAGNOSIS — J449 Chronic obstructive pulmonary disease, unspecified: Secondary | ICD-10-CM | POA: Diagnosis present

## 2019-03-28 DIAGNOSIS — I447 Left bundle-branch block, unspecified: Secondary | ICD-10-CM | POA: Diagnosis not present

## 2019-03-28 DIAGNOSIS — E876 Hypokalemia: Secondary | ICD-10-CM | POA: Diagnosis not present

## 2019-03-28 DIAGNOSIS — R6521 Severe sepsis with septic shock: Secondary | ICD-10-CM | POA: Diagnosis present

## 2019-03-28 DIAGNOSIS — Z8249 Family history of ischemic heart disease and other diseases of the circulatory system: Secondary | ICD-10-CM

## 2019-03-28 DIAGNOSIS — G9340 Encephalopathy, unspecified: Secondary | ICD-10-CM | POA: Diagnosis not present

## 2019-03-28 DIAGNOSIS — D649 Anemia, unspecified: Secondary | ICD-10-CM | POA: Diagnosis present

## 2019-03-28 DIAGNOSIS — Z9849 Cataract extraction status, unspecified eye: Secondary | ICD-10-CM

## 2019-03-28 DIAGNOSIS — E7800 Pure hypercholesterolemia, unspecified: Secondary | ICD-10-CM | POA: Diagnosis present

## 2019-03-28 DIAGNOSIS — K275 Chronic or unspecified peptic ulcer, site unspecified, with perforation: Secondary | ICD-10-CM | POA: Diagnosis present

## 2019-03-28 DIAGNOSIS — K219 Gastro-esophageal reflux disease without esophagitis: Secondary | ICD-10-CM | POA: Diagnosis present

## 2019-03-28 DIAGNOSIS — K668 Other specified disorders of peritoneum: Secondary | ICD-10-CM | POA: Diagnosis not present

## 2019-03-28 DIAGNOSIS — Z79899 Other long term (current) drug therapy: Secondary | ICD-10-CM

## 2019-03-28 DIAGNOSIS — K251 Acute gastric ulcer with perforation: Secondary | ICD-10-CM | POA: Diagnosis not present

## 2019-03-28 DIAGNOSIS — J9600 Acute respiratory failure, unspecified whether with hypoxia or hypercapnia: Secondary | ICD-10-CM

## 2019-03-28 DIAGNOSIS — I110 Hypertensive heart disease with heart failure: Secondary | ICD-10-CM | POA: Diagnosis present

## 2019-03-28 DIAGNOSIS — R579 Shock, unspecified: Secondary | ICD-10-CM

## 2019-03-28 DIAGNOSIS — K255 Chronic or unspecified gastric ulcer with perforation: Secondary | ICD-10-CM | POA: Diagnosis not present

## 2019-03-28 DIAGNOSIS — Z4682 Encounter for fitting and adjustment of non-vascular catheter: Secondary | ICD-10-CM | POA: Diagnosis not present

## 2019-03-28 DIAGNOSIS — I471 Supraventricular tachycardia: Secondary | ICD-10-CM | POA: Diagnosis present

## 2019-03-28 DIAGNOSIS — Z800 Family history of malignant neoplasm of digestive organs: Secondary | ICD-10-CM

## 2019-03-28 DIAGNOSIS — Z823 Family history of stroke: Secondary | ICD-10-CM

## 2019-03-28 DIAGNOSIS — T182XXA Foreign body in stomach, initial encounter: Secondary | ICD-10-CM | POA: Diagnosis not present

## 2019-03-28 DIAGNOSIS — R1013 Epigastric pain: Secondary | ICD-10-CM | POA: Diagnosis not present

## 2019-03-28 DIAGNOSIS — Z96651 Presence of right artificial knee joint: Secondary | ICD-10-CM | POA: Diagnosis present

## 2019-03-28 DIAGNOSIS — K920 Hematemesis: Secondary | ICD-10-CM | POA: Diagnosis not present

## 2019-03-28 DIAGNOSIS — C8590 Non-Hodgkin lymphoma, unspecified, unspecified site: Secondary | ICD-10-CM | POA: Diagnosis present

## 2019-03-28 DIAGNOSIS — K252 Acute gastric ulcer with both hemorrhage and perforation: Secondary | ICD-10-CM | POA: Diagnosis not present

## 2019-03-28 DIAGNOSIS — I959 Hypotension, unspecified: Secondary | ICD-10-CM | POA: Diagnosis present

## 2019-03-28 DIAGNOSIS — F419 Anxiety disorder, unspecified: Secondary | ICD-10-CM | POA: Diagnosis present

## 2019-03-28 DIAGNOSIS — Z7951 Long term (current) use of inhaled steroids: Secondary | ICD-10-CM

## 2019-03-28 DIAGNOSIS — J9690 Respiratory failure, unspecified, unspecified whether with hypoxia or hypercapnia: Secondary | ICD-10-CM | POA: Diagnosis not present

## 2019-03-28 DIAGNOSIS — E871 Hypo-osmolality and hyponatremia: Secondary | ICD-10-CM | POA: Diagnosis present

## 2019-03-28 DIAGNOSIS — I10 Essential (primary) hypertension: Secondary | ICD-10-CM | POA: Diagnosis not present

## 2019-03-28 DIAGNOSIS — A419 Sepsis, unspecified organism: Principal | ICD-10-CM | POA: Diagnosis present

## 2019-03-28 DIAGNOSIS — K256 Chronic or unspecified gastric ulcer with both hemorrhage and perforation: Secondary | ICD-10-CM | POA: Diagnosis present

## 2019-03-28 DIAGNOSIS — Z6841 Body Mass Index (BMI) 40.0 and over, adult: Secondary | ICD-10-CM | POA: Diagnosis not present

## 2019-03-28 DIAGNOSIS — M8580 Other specified disorders of bone density and structure, unspecified site: Secondary | ICD-10-CM | POA: Diagnosis present

## 2019-03-28 DIAGNOSIS — R52 Pain, unspecified: Secondary | ICD-10-CM | POA: Diagnosis not present

## 2019-03-28 DIAGNOSIS — R0902 Hypoxemia: Secondary | ICD-10-CM | POA: Diagnosis not present

## 2019-03-28 DIAGNOSIS — D72829 Elevated white blood cell count, unspecified: Secondary | ICD-10-CM | POA: Diagnosis present

## 2019-03-28 DIAGNOSIS — Z807 Family history of other malignant neoplasms of lymphoid, hematopoietic and related tissues: Secondary | ICD-10-CM

## 2019-03-28 DIAGNOSIS — S3630XA Unspecified injury of stomach, initial encounter: Secondary | ICD-10-CM

## 2019-03-28 DIAGNOSIS — K5909 Other constipation: Secondary | ICD-10-CM | POA: Diagnosis present

## 2019-03-28 DIAGNOSIS — R578 Other shock: Secondary | ICD-10-CM | POA: Diagnosis not present

## 2019-03-28 DIAGNOSIS — I4891 Unspecified atrial fibrillation: Secondary | ICD-10-CM | POA: Diagnosis present

## 2019-03-28 DIAGNOSIS — R0600 Dyspnea, unspecified: Secondary | ICD-10-CM

## 2019-03-28 DIAGNOSIS — Z90710 Acquired absence of both cervix and uterus: Secondary | ICD-10-CM

## 2019-03-28 DIAGNOSIS — G4733 Obstructive sleep apnea (adult) (pediatric): Secondary | ICD-10-CM | POA: Diagnosis present

## 2019-03-28 DIAGNOSIS — Z803 Family history of malignant neoplasm of breast: Secondary | ICD-10-CM

## 2019-03-28 DIAGNOSIS — M1990 Unspecified osteoarthritis, unspecified site: Secondary | ICD-10-CM | POA: Diagnosis present

## 2019-03-28 DIAGNOSIS — M810 Age-related osteoporosis without current pathological fracture: Secondary | ICD-10-CM | POA: Diagnosis present

## 2019-03-28 DIAGNOSIS — Z8673 Personal history of transient ischemic attack (TIA), and cerebral infarction without residual deficits: Secondary | ICD-10-CM

## 2019-03-28 DIAGNOSIS — R0602 Shortness of breath: Secondary | ICD-10-CM | POA: Diagnosis not present

## 2019-03-28 LAB — CBC
HCT: 42.2 % (ref 36.0–46.0)
Hemoglobin: 13.1 g/dL (ref 12.0–15.0)
MCH: 28.8 pg (ref 26.0–34.0)
MCHC: 31 g/dL (ref 30.0–36.0)
MCV: 92.7 fL (ref 80.0–100.0)
Platelets: 319 10*3/uL (ref 150–400)
RBC: 4.55 MIL/uL (ref 3.87–5.11)
RDW: 14.6 % (ref 11.5–15.5)
WBC: 14.8 10*3/uL — ABNORMAL HIGH (ref 4.0–10.5)
nRBC: 0 % (ref 0.0–0.2)

## 2019-03-28 LAB — HEMOGLOBIN AND HEMATOCRIT, BLOOD
HCT: 25.6 % — ABNORMAL LOW (ref 36.0–46.0)
HCT: 29.1 % — ABNORMAL LOW (ref 36.0–46.0)
Hemoglobin: 8 g/dL — ABNORMAL LOW (ref 12.0–15.0)
Hemoglobin: 9 g/dL — ABNORMAL LOW (ref 12.0–15.0)

## 2019-03-28 LAB — COMPREHENSIVE METABOLIC PANEL
ALT: 27 U/L (ref 0–44)
AST: 39 U/L (ref 15–41)
Albumin: 3.3 g/dL — ABNORMAL LOW (ref 3.5–5.0)
Alkaline Phosphatase: 94 U/L (ref 38–126)
Anion gap: 12 (ref 5–15)
BUN: 21 mg/dL (ref 8–23)
CO2: 26 mmol/L (ref 22–32)
Calcium: 9.1 mg/dL (ref 8.9–10.3)
Chloride: 95 mmol/L — ABNORMAL LOW (ref 98–111)
Creatinine, Ser: 0.95 mg/dL (ref 0.44–1.00)
GFR calc Af Amer: >60 mL/min (ref >60)
GFR calc non Af Amer: 57 mL/min — ABNORMAL LOW (ref >60)
Glucose, Bld: 144 mg/dL — ABNORMAL HIGH (ref 70–99)
Potassium: 5.1 mmol/L (ref 3.5–5.1)
Sodium: 133 mmol/L — ABNORMAL LOW (ref 135–145)
Total Bilirubin: 2 mg/dL — ABNORMAL HIGH (ref 0.3–1.2)
Total Protein: 6.8 g/dL (ref 6.5–8.1)

## 2019-03-28 LAB — URINALYSIS, ROUTINE W REFLEX MICROSCOPIC
Bilirubin Urine: NEGATIVE
Glucose, UA: NEGATIVE mg/dL
Hgb urine dipstick: NEGATIVE
Ketones, ur: NEGATIVE mg/dL
Leukocytes,Ua: NEGATIVE
Nitrite: POSITIVE — AB
Protein, ur: NEGATIVE mg/dL
Specific Gravity, Urine: 1.035 — ABNORMAL HIGH (ref 1.005–1.030)
pH: 5 (ref 5.0–8.0)

## 2019-03-28 LAB — LIPASE, BLOOD: Lipase: 28 U/L (ref 11–51)

## 2019-03-28 LAB — TROPONIN I (HIGH SENSITIVITY)
Troponin I (High Sensitivity): 6 ng/L (ref <18)
Troponin I (High Sensitivity): 6 ng/L (ref <18)

## 2019-03-28 LAB — SARS CORONAVIRUS 2 (TAT 6-24 HRS): SARS Coronavirus 2: NEGATIVE

## 2019-03-28 LAB — LACTIC ACID, PLASMA
Lactic Acid, Venous: 1.9 mmol/L (ref 0.5–1.9)
Lactic Acid, Venous: 2.2 mmol/L (ref 0.5–1.9)
Lactic Acid, Venous: 2 mmol/L (ref 0.5–1.9)

## 2019-03-28 LAB — PROCALCITONIN: Procalcitonin: 0.26 ng/mL

## 2019-03-28 MED ORDER — SODIUM CHLORIDE 0.9 % IV BOLUS
500.0000 mL | Freq: Once | INTRAVENOUS | Status: AC
  Administered 2019-03-28: 500 mL via INTRAVENOUS

## 2019-03-28 MED ORDER — FLUTICASONE-UMECLIDIN-VILANT 100-62.5-25 MCG/INH IN AEPB: 1.0000 | INHALATION_SPRAY | Freq: Every day | RESPIRATORY_TRACT | Status: DC

## 2019-03-28 MED ORDER — ACETAMINOPHEN 650 MG RE SUPP
650.0000 mg | Freq: Four times a day (QID) | RECTAL | Status: DC | PRN
  Administered 2019-03-28: 650 mg via RECTAL
  Filled 2019-03-28: qty 1

## 2019-03-28 MED ORDER — SODIUM CHLORIDE 0.9 % IV BOLUS
1000.0000 mL | Freq: Once | INTRAVENOUS | Status: AC
  Administered 2019-03-28: 1000 mL via INTRAVENOUS

## 2019-03-28 MED ORDER — SODIUM CHLORIDE 0.9 % IV SOLN
8.0000 mg/h | INTRAVENOUS | Status: AC
  Administered 2019-03-28 – 2019-03-30 (×5): 8 mg/h via INTRAVENOUS
  Filled 2019-03-28 (×8): qty 80

## 2019-03-28 MED ORDER — CHLORHEXIDINE GLUCONATE CLOTH 2 % EX PADS
6.0000 | MEDICATED_PAD | Freq: Every day | CUTANEOUS | Status: DC
  Administered 2019-03-28 – 2019-04-03 (×6): 6 via TOPICAL

## 2019-03-28 MED ORDER — ONDANSETRON HCL 4 MG/2ML IJ SOLN
4.0000 mg | Freq: Once | INTRAMUSCULAR | Status: AC | PRN
  Administered 2019-03-28: 4 mg via INTRAVENOUS
  Filled 2019-03-28: qty 2

## 2019-03-28 MED ORDER — MORPHINE SULFATE (PF) 4 MG/ML IV SOLN
4.0000 mg | Freq: Once | INTRAVENOUS | Status: AC
  Administered 2019-03-28: 4 mg via INTRAVENOUS
  Filled 2019-03-28: qty 1

## 2019-03-28 MED ORDER — IOPAMIDOL (ISOVUE-300) INJECTION 61%
100.0000 mL | Freq: Once | INTRAVENOUS | Status: AC | PRN
  Administered 2019-03-28: 100 mL via INTRAVENOUS

## 2019-03-28 MED ORDER — MAGNESIUM SULFATE IN D5W 1-5 GM/100ML-% IV SOLN
1.0000 g | Freq: Once | INTRAVENOUS | Status: AC
  Administered 2019-03-28: 1 g via INTRAVENOUS
  Filled 2019-03-28: qty 100

## 2019-03-28 MED ORDER — ONDANSETRON HCL 4 MG/2ML IJ SOLN
4.0000 mg | Freq: Four times a day (QID) | INTRAMUSCULAR | Status: DC | PRN
  Administered 2019-03-29: 4 mg via INTRAVENOUS
  Filled 2019-03-28: qty 2

## 2019-03-28 MED ORDER — PIPERACILLIN-TAZOBACTAM 3.375 G IVPB
3.3750 g | Freq: Three times a day (TID) | INTRAVENOUS | Status: DC
  Administered 2019-03-28 – 2019-04-03 (×17): 3.375 g via INTRAVENOUS
  Filled 2019-03-28 (×21): qty 50

## 2019-03-28 MED ORDER — SODIUM CHLORIDE 0.9 % IV SOLN
INTRAVENOUS | Status: DC
  Administered 2019-03-28: 100 mL/h via INTRAVENOUS
  Administered 2019-03-29 – 2019-04-01 (×5): via INTRAVENOUS

## 2019-03-28 MED ORDER — UMECLIDINIUM BROMIDE 62.5 MCG/INH IN AEPB
1.0000 | INHALATION_SPRAY | Freq: Every day | RESPIRATORY_TRACT | Status: DC
  Administered 2019-03-29 – 2019-04-04 (×7): 1 via RESPIRATORY_TRACT
  Filled 2019-03-28: qty 7

## 2019-03-28 MED ORDER — MORPHINE SULFATE (PF) 2 MG/ML IV SOLN
2.0000 mg | INTRAVENOUS | Status: DC | PRN
  Administered 2019-03-29: 2 mg via INTRAVENOUS
  Filled 2019-03-28: qty 1

## 2019-03-28 MED ORDER — PANTOPRAZOLE SODIUM 40 MG IV SOLR
40.0000 mg | Freq: Two times a day (BID) | INTRAVENOUS | Status: DC
  Administered 2019-03-31 – 2019-04-04 (×8): 40 mg via INTRAVENOUS
  Filled 2019-03-28 (×8): qty 40

## 2019-03-28 MED ORDER — ALBUTEROL SULFATE (2.5 MG/3ML) 0.083% IN NEBU: 2.5000 mg | INHALATION_SOLUTION | Freq: Four times a day (QID) | RESPIRATORY_TRACT | Status: DC | PRN

## 2019-03-28 MED ORDER — ACETAMINOPHEN 325 MG PO TABS: 650.0000 mg | ORAL_TABLET | Freq: Four times a day (QID) | ORAL | Status: DC | PRN

## 2019-03-28 MED ORDER — ONDANSETRON HCL 4 MG/2ML IJ SOLN
4.0000 mg | Freq: Once | INTRAMUSCULAR | Status: AC
  Administered 2019-03-28: 4 mg via INTRAVENOUS
  Filled 2019-03-28: qty 2

## 2019-03-28 MED ORDER — SODIUM CHLORIDE 0.9% FLUSH
3.0000 mL | Freq: Two times a day (BID) | INTRAVENOUS | Status: DC
  Administered 2019-03-28 – 2019-04-04 (×10): 3 mL via INTRAVENOUS

## 2019-03-28 MED ORDER — SODIUM CHLORIDE 0.9 % IV SOLN
80.0000 mg | Freq: Once | INTRAVENOUS | Status: AC
  Administered 2019-03-28: 80 mg via INTRAVENOUS
  Filled 2019-03-28: qty 80

## 2019-03-28 MED ORDER — PIPERACILLIN-TAZOBACTAM 3.375 G IVPB 30 MIN: 3.3750 g | Freq: Three times a day (TID) | INTRAVENOUS | Status: DC

## 2019-03-28 MED ORDER — ALBUTEROL SULFATE (2.5 MG/3ML) 0.083% IN NEBU: 2.5000 mg | INHALATION_SOLUTION | RESPIRATORY_TRACT | Status: DC | PRN

## 2019-03-28 MED ORDER — ONDANSETRON HCL 4 MG PO TABS: 4.0000 mg | ORAL_TABLET | Freq: Four times a day (QID) | ORAL | Status: DC | PRN

## 2019-03-28 MED ORDER — ALBUMIN HUMAN 5 % IV SOLN
25.0000 g | Freq: Once | INTRAVENOUS | Status: AC
  Administered 2019-03-28: 25 g via INTRAVENOUS
  Filled 2019-03-28: qty 500

## 2019-03-28 MED ORDER — FLUTICASONE FUROATE-VILANTEROL 100-25 MCG/INH IN AEPB
1.0000 | INHALATION_SPRAY | Freq: Every day | RESPIRATORY_TRACT | Status: DC
  Administered 2019-03-29 – 2019-04-04 (×7): 1 via RESPIRATORY_TRACT
  Filled 2019-03-28: qty 28

## 2019-03-28 MED ORDER — PIPERACILLIN-TAZOBACTAM 3.375 G IVPB 30 MIN
3.3750 g | Freq: Once | INTRAVENOUS | Status: AC
  Administered 2019-03-28: 3.375 g via INTRAVENOUS
  Filled 2019-03-28: qty 50

## 2019-03-28 MED ORDER — ONDANSETRON HCL 4 MG/2ML IJ SOLN: 4.0000 mg | Freq: Once | INTRAMUSCULAR | Status: DC

## 2019-03-28 MED ORDER — DULOXETINE HCL 60 MG PO CPEP
60.0000 mg | ORAL_CAPSULE | Freq: Every day | ORAL | Status: DC
  Filled 2019-03-28: qty 1

## 2019-03-28 MED ORDER — MONTELUKAST SODIUM 10 MG PO TABS
10.0000 mg | ORAL_TABLET | Freq: Every day | ORAL | Status: DC
  Filled 2019-03-28: qty 1

## 2019-03-28 NOTE — ED Notes (Signed)
NG placed, dark brownish red liquid drainage.  Placed on low wall suction

## 2019-03-28 NOTE — ED Notes (Signed)
Patient transported to CT 

## 2019-03-28 NOTE — ED Triage Notes (Signed)
Pt brought to ED by [facility] from home for c/o 6/10 epigastric pain since yesterday morning with nausea and vomiting. BP 120/70. HR 78, r 21, SPO2 95% RA.

## 2019-03-28 NOTE — Progress Notes (Signed)
Pt unable to wear cpap due to pt having NG tube in place. RT will continue to monitor as needed.

## 2019-03-28 NOTE — Consult Note (Signed)
[name] 03-26-1941  [id number].    Requesting MD: Dr. Virgel Manifold Chief Complaint/Reason for Consult: contained perforated gastric ulcer  HPI:  This is a 78 yo white female with a history of prior perforated gastric ulcer, s/p lap washout and drain placement by Dr. Brantley Stage in 2018 where she was found to have sealed the perforation by the time of the operation.  She is currently not taking PPI therapy.  She also has a H/O HTN, CVA, obesity, asthma, and lymphoma for which she is free of.    Yesterday morning around 940am she began having some epigastric abdominal pain.  She thought this was gas initially.  It was mostly dull, achy, and sore, but at times sharp and stabby.  Nothing made this better.  As the day progressed she developed dry heaves overnight.  She continued to feel worse.  She denies CP, cough, fevers, diarrhea.  She called EMS who brought her to [facility] for evaluation.  Upon arrival she did have an episode of hematemesis of BRB.  Her hgb has been found to be 13.1 with a WBC of 14.8.  She also has a CT scan that revealed inflammation and trace pneumoperitoneum along the distal stomach, similar to appearance in 2018, compatible with a contained perforated gastric ulcer.  The duodenum has been spared.  We have been asked to see her for further evaluation.  ROS: ROS: Please see HPI, otherwise all other systems have been reviewed and are negative except for edema of her LEs that is managed with compression hose.  Family History  Problem Relation Age of Onset   Lymphoma Mother    Breast cancer Mother    Coronary artery disease Father 42       died   Stroke Father    Colon cancer Maternal Uncle     Past Medical History:  Diagnosis Date   Anxiety    Arthritis    Asthma    hx of   Back pain    Chronic constipation    Depression    hx of   Dyspnea    Family history of adverse reaction to anesthesia    DAD bp ELEVATED    GERD (gastroesophageal reflux  disease)    High cholesterol    Hypertension    Lymphoma ([place])    Obesity    Sleep apnea    uses a CPAP   Stroke ([place])    2010   SVT (supraventricular tachycardia) ([place])     Past Surgical History:  Procedure Laterality Date   BACK SURGERY  2009   for lymphoma   BONE BIOPSY     CESAREAN SECTION     ESOPHAGOGASTRODUODENOSCOPY     ESOPHAGOGASTRODUODENOSCOPY (EGD) WITH PROPOFOL N/A 08/05/2017   Procedure: ESOPHAGOGASTRODUODENOSCOPY (EGD) WITH PROPOFOL;  Surgeon: Doran Stabler, MD;  Location: [facility];  Service: Gastroenterology;  Laterality: N/A;   EYE SURGERY     cataracts   LAPAROSCOPY N/A 02/01/2017   Procedure: LAPAROSCOPY DIAGNOSTIC;  Surgeon: Erroll Luna, MD;  Location: [place];  Service: General;  Laterality: N/A;   REPLACEMENT TOTAL KNEE     right   TOTAL ABDOMINAL HYSTERECTOMY  1973    Social History:  reports that she has never smoked. She has never used smokeless tobacco. She reports that she does not drink alcohol or use drugs.  Allergies: No Known Allergies  (Not in a hospital admission)    Physical Exam: Blood pressure (!) 109/56, pulse  99, temperature (!) 97.5 F (36.4 C), temperature source Oral, resp. rate (!) 24, height 5\' 1"  (1.549 m), weight 119.3 kg, SpO2 99 %. General: pleasant, ill-appearing, obese white female who is laying in bed in NAD HEENT: head is normocephalic, atraumatic.  Sclera are noninjected.  PERRL.  Ears and nose without any masses or lesions.  Mouth is pink and dry Heart: regular, rate, and rhythm.  Normal s1,s2. No obvious murmurs, gallops, or rubs noted.  Palpable radial and pedal pulses bilaterally Lungs: CTAB, no wheezes, rhonchi, or rales noted.  Respiratory effort nonlabored Abd: soft, only focally tender in her epigastrium, no peritonitis, guarding, or rebound, mild bloating, obese, +BS, no masses or organomegaly.  Reducible umbilical hernia MS: all 4 extremities are symmetrical with no cyanosis, clubbing.   She does have some chronic venous stasis changes in her BLEs with +1 pitting edema Skin: warm and dry with no masses, lesions, or rashes Psych: A&Ox3 but lethargic secondary to pain medications.   Results for orders placed or performed during the hospital encounter of 03/28/19 (from the past 48 hour(s))  Lipase, blood     Status: None   Collection Time: 03/28/19  5:24 AM  Result Value Ref Range   Lipase 28 11 - 51 U/L    Comment: Performed at [facility], [place] [address], [place], [place] [address]  Comprehensive metabolic panel     Status: Abnormal   Collection Time: 03/28/19  5:24 AM  Result Value Ref Range   Sodium 133 (L) 135 - 145 mmol/L   Potassium 5.1 3.5 - 5.1 mmol/L    Comment: HEMOLYSIS AT THIS LEVEL MAY AFFECT RESULT   Chloride 95 (L) 98 - 111 mmol/L   CO2 26 22 - 32 mmol/L   Glucose, Bld 144 (H) 70 - 99 mg/dL   BUN 21 8 - 23 mg/dL   Creatinine, Ser 0.95 0.44 - 1.00 mg/dL   Calcium 9.1 8.9 - 10.3 mg/dL   Total Protein 6.8 6.5 - 8.1 g/dL   Albumin 3.3 (L) 3.5 - 5.0 g/dL   AST 39 15 - 41 U/L   ALT 27 0 - 44 U/L   Alkaline Phosphatase 94 38 - 126 U/L   Total Bilirubin 2.0 (H) 0.3 - 1.2 mg/dL   GFR calc non Af Amer 57 (L) >60 mL/min   GFR calc Af Amer >60 >60 mL/min   Anion gap 12 5 - 15    Comment: Performed at [facility], [place] [address], [place], [place] [address]  CBC     Status: Abnormal   Collection Time: 03/28/19  5:24 AM  Result Value Ref Range   WBC 14.8 (H) 4.0 - 10.5 K/uL   RBC 4.55 3.87 - 5.11 MIL/uL   Hemoglobin 13.1 12.0 - 15.0 g/dL   HCT 42.2 36.0 - 46.0 %   MCV 92.7 80.0 - 100.0 fL   MCH 28.8 26.0 - 34.0 pg   MCHC 31.0 30.0 - 36.0 g/dL   RDW 14.6 11.5 - 15.5 %   Platelets 319 150 - 400 K/uL   nRBC 0.0 0.0 - 0.2 %    Comment: Performed at [facility], [place] [address], [place], [place] [address]  Troponin I (High Sensitivity)     Status: None   Collection Time: 03/28/19  5:40 AM  Result Value Ref Range   Troponin I (High  Sensitivity) 6 <18 ng/L    Comment: (NOTE) Elevated high sensitivity troponin I (hsTnI) values  and significant  changes across serial measurements may suggest ACS but many other  chronic and acute conditions are known to elevate hsTnI results.  Refer to the "Links" section for chest pain algorithms and additional  guidance. Performed at [facility], [place] [address] [place], [place] [address]   Urinalysis, Routine w reflex microscopic     Status: Abnormal   Collection Time: 03/28/19  8:09 AM  Result Value Ref Range   Color, Urine YELLOW YELLOW   APPearance CLEAR CLEAR   Specific Gravity, Urine 1.035 (H) 1.005 - 1.030   pH 5.0 5.0 - 8.0   Glucose, UA NEGATIVE NEGATIVE mg/dL   Hgb urine dipstick NEGATIVE NEGATIVE   Bilirubin Urine NEGATIVE NEGATIVE   Ketones, ur NEGATIVE NEGATIVE mg/dL   Protein, ur NEGATIVE NEGATIVE mg/dL   Nitrite POSITIVE (A) NEGATIVE   Leukocytes,Ua NEGATIVE NEGATIVE   RBC / HPF 0-5 0 - 5 RBC/hpf   WBC, UA 11-20 0 - 5 WBC/hpf   Bacteria, UA MANY (A) NONE SEEN   Squamous Epithelial / LPF 0-5 0 - 5   Mucus PRESENT     Comment: Performed at [facility], [address], [place], [place] [address]  Type and screen [place]     Status: None   Collection Time: 03/28/19  9:13 AM  Result Value Ref Range   ABO/RH(D) A POS    Antibody Screen NEG    Sample Expiration      03/31/2019,2359 Performed at [facility], [place] [address], [place], [place] [address]    Ct Abdomen Pelvis W Contrast  Addendum Date: 03/28/2019   ADDENDUM REPORT: 03/28/2019 08:32 ADDENDUM: Study discussed by telephone with Dr. Wilson Singer in the ED on 03/28/2019 at 0822 hours. Electronically Signed   By: Genevie Ann M.D.   On: 03/28/2019 08:32   Result Date: 03/28/2019 CLINICAL DATA:  78 year old female with abdominal pain, nausea and vomiting since last night. EXAM: CT ABDOMEN AND PELVIS WITH CONTRAST TECHNIQUE: Multidetector CT imaging of the abdomen and pelvis was  performed using the standard protocol following bolus administration of intravenous contrast. CONTRAST:  148mL ISOVUE-300 IOPAMIDOL (ISOVUE-300) INJECTION 61% COMPARISON:  CT Abdomen and Pelvis 01/31/2017. FINDINGS: Lower chest: Lung bases have not significantly changed with chronic lower lobe atelectasis or scarring. There is superimposed mosaic attenuation today, probably gas trapping. Stable mild cardiomegaly. No pericardial or pleural effusion. Hepatobiliary: 19 millimeter gallstones. No pericholecystic inflammation. Normal liver enhancement. No bile duct enlargement. Pancreas: Negative. Spleen: Negative. Adrenals/Urinary Tract: Negative adrenal glands. Stable kidneys. Bilateral renal enhancement and contrast excretion is symmetric and normal. Small left renal parapelvic cysts. Unremarkable ureter. Stomach/Bowel: Negative rectum aside from retained stool. Small chronic calcified perirectal lymph node is unchanged. Mild diverticulosis in the sigmoid colon, no active inflammation. Negative descending colon. Redundant but otherwise negative transverse colon. Negative right colon. Diminutive appendix redemonstrated. Negative terminal ileum. No dilated small bowel. There is confluent inflammatory stranding along the distal stomach with trace pneumoperitoneum in the adjacent mesentery on series 4, image 40. This is a similar appearance to that in 2018. The proximal duodenum is relatively spared. There is no discrete gastric lesion. No other free air. Small phlegmon like nodularity in the adjacent mesentery up to 2 centimeters. Retained fluid in the proximal stomach. No free fluid. Vascular/Lymphatic: Aortoiliac calcified atherosclerosis. Major arterial structures are patent. Portal venous system is patent. Reproductive: Surgically absent uterus. Diminutive or absent ovaries. Other: No pelvic free fluid. Musculoskeletal: Widespread thoracolumbar  compression fractures and multilevel prior vertebral augmentation. Chronic  posterior spinal rods. Osteopenia. Degenerative changes also at the hips and pubic symphysis. No acute osseous abnormality identified. IMPRESSION: 1. Inflammation and trace pneumoperitoneum along the distal stomach, similar to the abnormal CT in 2018, compatible with contained Perforated Peptic Ulcer. Today the duodenum appears relatively spared. No discrete gastric lesion. No free fluid. 2. No other acute findings. Chronic findings include: Lung base scarring, cholelithiasis, osteopenia with spinal compression fractures, aortic Atherosclerosis (ICD10-I70.0). Electronically Signed: By: Genevie Ann M.D. On: 03/28/2019 08:14      Assessment/Plan Asthma H/O CVA H/O lymphoma HTN H/O contained gastric perforation in 2018  Contained gastric ulcer perforation with hematemesis The patient appears to have a contained gastric ulcer perforation at the same site as previously noted in 2018.  She is not on PPI therapy at home.  She is only focally tender in her epigastrium without diffuse pain or peritonitis c/w free perforation and diffuse contamination.  She did have an episode of hematemesis.  We would recommend NGT placement to more closely monitor this.  If she were to have significant bloody output from her NGT and a dropping hgb, she may require GI or IR for intervention of bleeding.  Agree with repeat H/H at 12 and 1600.  We also require protonix gtt and abx therapy which have both been started already.  We will continue to follow closely, but feel she can be managed currently with conservative management.  If this changes, we did discuss a possible operation.  FEN - NPO/NGT/IVFs/protonix gtt VTE - would hold given hematemesis ID - [email or web address] 8/26 --[place], [facility] 03/28/2019, 10:47 AM Pager: [phone]

## 2019-03-28 NOTE — ED Provider Notes (Signed)
Assumed care in sign out with imaging pending. Unfortunately consistent with likely perforated ulcer. Hx of the same. When I went to see her she has just had small volume hematemesis. Bright red blood. Peritonitis on my exam. Zosyn ordered. Protonix. Type and screen. Surgery paged. Discussed with pt/daughter the findings, need for hospitalization and likely surgery. Also appears to have UTI. Urine culture sent. Routine COVID screening.   CRITICAL CARE Performed by: Virgel Manifold Total critical care time: 40 minutes Critical care time was exclusive of separately billable procedures and treating other patients. Critical care was necessary to treat or prevent imminent or life-threatening deterioration. Critical care was time spent personally by me on the following activities: development of treatment plan with patient and/or surrogate as well as nursing, discussions with consultants, evaluation of patient's response to treatment, examination of patient, obtaining history from patient or surrogate, ordering and performing treatments and interventions, ordering and review of laboratory studies, ordering and review of radiographic studies, pulse oximetry and re-evaluation of patient's condition.  [name] was evaluated in Emergency Department on 03/28/2019 for the symptoms described in the history of present illness. She was evaluated in the context of the global COVID-19 pandemic, which necessitated consideration that the patient might be at risk for infection with the SARS-CoV-2 virus that causes COVID-19. Institutional protocols and algorithms that pertain to the evaluation of patients at risk for COVID-19 are in a state of rapid change based on information released by regulatory bodies including the CDC and federal and state organizations. These policies and algorithms were followed during the patient's care in the ED.     Virgel Manifold, MD 03/28/19 [phone]

## 2019-03-28 NOTE — Procedures (Signed)
Arterial Catheter Insertion Procedure Note [name] [id number] August 24, 1940  Procedure: Insertion of Arterial Catheter  Indications: Blood pressure monitoring  Procedure Details Consent: Risks of procedure as well as the alternatives and risks of each were explained to the (patient/caregiver).  Consent for procedure obtained. Time Out: Verified patient identification, verified procedure, site/side was marked, verified correct patient position, special equipment/implants available, medications/allergies/relevent history reviewed, required imaging and test results available.  Performed  Maximum sterile technique was used including antiseptics, cap, gloves, gown, hand hygiene, mask and sheet. Skin prep: Chlorhexidine; local anesthetic administered 20 gauge catheter was inserted into right radial artery using the Seldinger technique. ULTRASOUND GUIDANCE USED: NO Evaluation Blood flow good; BP tracing good. Complications: No apparent complications.   Graciella Freer 03/28/2019

## 2019-03-28 NOTE — Progress Notes (Signed)
eLink Physician-Brief Progress Note Patient Name: MONQUIE SCHMEISER DOB: 09-16-1940 MRN: [id number]   Date of Service  03/28/2019  HPI/Events of Note  Upper GI bleeding with hypotension and tachycardia, last hemoglobin was 9 and lactate is 2.0  eICU Interventions  Check next scheduled Hgb early, 5 % Albumin 500 ml iv x 1, arterial line for closer monitoring of BP.        Frederik Pear 03/28/2019, 10:37 PM

## 2019-03-28 NOTE — H&P (Signed)
NAME:  [name], MRN:  [id number], DOB:  Dec 17, 1940, LOS: 0 ADMISSION DATE:  03/28/2019, CONSULTATION DATE:   REFERRING MD:  Surgery ([place]), CHIEF COMPLAINT:  Hypotension, GI bleeding    Brief History   78yo female admitted 8/26 with hypotension in the setting of probable contained perforated peptic ulcer without peritonitis and hematemesis.    History of present illness   78yo female with hx perforated gastric ulcer 2018, HTN, CVA, asthma who presented 8/26 after 2 days of epigastric pain.  Onset was initially on 8/25. Primarily self treated w/ NPO status and rest. Pain really had no reliving factors but seemed to get worse when sitting down. As the morning  progressed on 8/26 she developed nausea and dry heaves and progressive malaise. Upon arrival to ER she had episode of hematemesis. She was seen in consultation by surgery and GI in ER.  CT scan revealed contained perforated peptic ulcer. Initial Hgb was 13.1 although likely hemoconcentrated after 36hrs nausea/vomiting. Some concern for mallory-weiss tear in setting nausea/vomiting/wretching.  She had progressive hypotension in ER and PCCM consulted for ICU admit.   Past Medical History   Past Medical History:  Diagnosis Date  . Anxiety   . Arthritis   . Asthma    hx of  . Back pain   . Chronic constipation   . Depression    hx of  . Dyspnea   . Family history of adverse reaction to anesthesia    DAD bp ELEVATED   . GERD (gastroesophageal reflux disease)   . High cholesterol   . Hypertension   . Lymphoma ([place])   . Obesity   . Sleep apnea    uses a CPAP  . Stroke ([place])    2010  . SVT (supraventricular tachycardia) ([place])      Significant Hospital Events     Consults:  GI  Surgery   Procedures:    Significant Diagnostic Tests:  CT abd/pelvis 8/26>>> 1. Inflammation and trace pneumoperitoneum along the distal stomach, similar to the abnormal CT in 2018, compatible with contained Perforated Peptic Ulcer.  Today the duodenum appears relatively spared. No discrete gastric lesion. No free fluid. 2. No other acute findings. Chronic findings include: Lung base scarring, cholelithiasis, osteopenia with spinal compression fractures, aortic Atherosclerosis (ICD10-I70.0).  Micro Data:    Antimicrobials:  Zosyn 8/26>>>  Interim history/subjective:  Seen in ER.  BP slightly improved with volume.  Awaiting f/u hgb.   Objective   Blood pressure (!) 86/60, pulse 98, temperature (!) 97.5 F (36.4 C), temperature source Oral, resp. rate (!) 23, height 5\' 1"  (1.549 m), weight 119.3 kg, SpO2 99 %.        Intake/Output Summary (Last 24 hours) at 03/28/2019 1212 Last data filed at 03/28/2019 1122 Gross per 24 hour  Intake 650 ml  Output -  Net 650 ml   Filed Weights   03/28/19 0547  Weight: 119.3 kg    Examination: General: pale acutely ill appearing 78 year old white female currently resting in bed not in acute distress but does have more shortness of breath and pain has been more constant  HENT: MM dry. Dried blood on tongue. MM pale sclera anicteric  Lungs: decreased bases Cardiovascular: RRR no MG Abdomen: obese + subxiphoid pain to palp Extremities: chronic LE edema warm. Pulses palp  Neuro: awake and alert  GU: voids   Resolved Hospital Problem list     Assessment & Plan:   Circulatory shock. -suspect multifactorial being  a mix of acute GI bleed (w/ concern for possible mallory-weiss tear from prolonged retching and vomiting) and also sepsis from perforated contained gastric ulcer   -seen by surgery ->does have rebound tenderness but they did not think peritonitis.  -currently getting IVFs -already has received zosyn PLAN -  Volume resuscitation  Stat f/u h/h ->suspect she will need transfusion was hemoconcentrated.  Hold antihypertensives from home:  lopressor, olmesartan, amlodipine, hctz Hold diuretics (only takes these PRN anyway) Dc aspirin  No need for pressor support  as of now: Add low dose peripheral neo if needed  Hold home Day 1 zosyn Needs ICU admit  Surgery following  GI bleed/ hematemesis -- as above. GI suspects could be r/t mallory-weiss tear  PLAN -  GI following  ? EGD  Protonix gtt  NPO  Hold ASA  PRN zofran    Hx COPD without acute exacerbation  PLAN -  Continue home BDs  (trelegy, albuterol PRN)  Pulmonary hygiene   Hx OSA  PLAN -  qhs CPAP   Best practice:  Diet: NPO Pain/Anxiety/Delirium protocol (if indicated): n/a VAP protocol (if indicated): n/a DVT prophylaxis: SCD's  GI prophylaxis: protonix Glucose control: n/a Mobility: BR Code Status: full Family Communication: updated in ER  Disposition: ICU  Labs   CBC: Recent Labs  Lab 03/28/19 0524  WBC 14.8*  HGB 13.1  HCT 42.2  MCV 92.7  PLT 99991111    Basic Metabolic Panel: Recent Labs  Lab 03/28/19 0524  NA 133*  K 5.1  CL 95*  CO2 26  GLUCOSE 144*  BUN 21  CREATININE 0.95  CALCIUM 9.1   GFR: Estimated Creatinine Clearance: 57.9 mL/min (by C-G formula based on SCr of 0.95 mg/dL). Recent Labs  Lab 03/28/19 0524  WBC 14.8*    Liver Function Tests: Recent Labs  Lab 03/28/19 0524  AST 39  ALT 27  ALKPHOS 94  BILITOT 2.0*  PROT 6.8  ALBUMIN 3.3*   Recent Labs  Lab 03/28/19 0524  LIPASE 28   No results for input(s): AMMONIA in the last 168 hours.  ABG    Component Value Date/Time   PHART 7.332 (L) 02/01/2017 1529   PCO2ART 49.4 (H) 02/01/2017 1529   PO2ART 106.0 02/01/2017 1529   HCO3 26.4 02/01/2017 1529   TCO2 28 02/01/2017 1529   O2SAT 98.0 02/01/2017 1529     Coagulation Profile: No results for input(s): INR, PROTIME in the last 168 hours.  Cardiac Enzymes: No results for input(s): CKTOTAL, CKMB, CKMBINDEX, TROPONINI in the last 168 hours.  HbA1C: Hgb A1c MFr Bld  Date/Time Value Ref Range Status  07/19/2011 11:04 AM 6.2 (H) <5.7 % Final    Comment:    (NOTE)                                                                        According to the ADA Clinical Practice Recommendations for 2011, when HbA1c is used as a screening test:  >=6.5%   Diagnostic of Diabetes Mellitus           (if abnormal result is confirmed) 5.7-6.4%   Increased risk of developing Diabetes Mellitus References:Diagnosis and Classification of Diabetes Mellitus,Diabetes [id number] 1):S62-S69 and Standards of Medical Care  in         Diabetes - 2011,Diabetes Care,2011,34 (Suppl 1):S11-S61.  06/05/2008 07:00 AM   Final   5.7 (NOTE)   The ADA recommends the following therapeutic goal for glycemic   control related to Hgb A1C measurement:   Goal of Therapy:   < 7.0% Hgb A1C   Reference: American Diabetes Association: Clinical Practice   Recommendations 2008, Diabetes Care,  2008, 31:(Suppl 1).    CBG: No results for input(s): GLUCAP in the last 168 hours.  Review of Systems:   As per HPI - All other systems reviewed and were neg.  Review of Systems  Constitutional: Negative for fever, malaise/fatigue and weight loss.  HENT: Negative.   Eyes: Negative.   Respiratory: Positive for shortness of breath. Negative for cough, hemoptysis, sputum production and wheezing.   Cardiovascular: Positive for leg swelling. Negative for chest pain.  Gastrointestinal: Positive for abdominal pain, heartburn, nausea and vomiting.  Genitourinary: Negative.   Musculoskeletal: Negative.   Skin: Negative.       Past Medical History  She,  has a past medical history of Anxiety, Arthritis, Asthma, Back pain, Chronic constipation, Depression, Dyspnea, Family history of adverse reaction to anesthesia, GERD (gastroesophageal reflux disease), High cholesterol, Hypertension, Lymphoma ([place]), Obesity, Sleep apnea, Stroke ([place]), and SVT (supraventricular tachycardia) ([place]).   Surgical History    Past Surgical History:  Procedure Laterality Date  . BACK SURGERY  2009   for lymphoma  . BONE BIOPSY    . CESAREAN SECTION    .  ESOPHAGOGASTRODUODENOSCOPY    . ESOPHAGOGASTRODUODENOSCOPY (EGD) WITH PROPOFOL N/A 08/05/2017   Procedure: ESOPHAGOGASTRODUODENOSCOPY (EGD) WITH PROPOFOL;  Surgeon: Doran Stabler, MD;  Location: [facility];  Service: Gastroenterology;  Laterality: N/A;  . EYE SURGERY     cataracts  . LAPAROSCOPY N/A 02/01/2017   Procedure: LAPAROSCOPY DIAGNOSTIC;  Surgeon: Erroll Luna, MD;  Location: [facility];  Service: General;  Laterality: N/A;  . REPLACEMENT TOTAL KNEE     right  . TOTAL ABDOMINAL HYSTERECTOMY  1973     Social History   reports that she has never smoked. She has never used smokeless tobacco. She reports that she does not drink alcohol or use drugs.   Family History   Her family history includes Breast cancer in her mother; Colon cancer in her maternal uncle; Coronary artery disease (age of onset: 70) in her father; Lymphoma in her mother; Stroke in her father.   Allergies No Known Allergies   Home Medications  Prior to Admission medications   Medication Sig Start Date End Date Taking? Authorizing Provider  acetaminophen (TYLENOL) 500 MG tablet Take 1,000 mg by mouth 2 (two) times daily.    Yes [provider]  albuterol (PROAIR HFA) 108 (90 Base) MCG/ACT inhaler Inhale 2 puffs into the lungs every 6 (six) hours as needed for wheezing or shortness of breath.    Yes [provider]  albuterol (PROVENTIL) (2.5 MG/3ML) 0.083% nebulizer solution Take 2.5 mg by nebulization every 6 (six) hours as needed for wheezing or shortness of breath.   Yes [provider]  ASPERCREME LIDOCAINE EX Apply 1 application topically 2 (two) times daily as needed (knee pain).    Yes [provider]  aspirin EC 81 MG tablet Take 81 mg by mouth daily.   Yes [provider]  DULoxetine (CYMBALTA) 60 MG capsule Take 60 mg by mouth daily.   Yes [provider]  Fluticasone-Umeclidin-Vilant (TRELEGY ELLIPTA) 100-62.5-25 MCG/INH AEPB  Inhale 1 puff into the  lungs daily.   Yes [provider]  furosemide (LASIX) 40 MG tablet Take 40 mg by mouth daily as needed (for fluid retention.).    Yes [provider]  lubiprostone (AMITIZA) 24 MCG capsule Take 24 mcg by mouth daily.    Yes [provider]  Menthol, Topical Analgesic, (BIOFREEZE) 4 % GEL Apply 1 application topically 2 (two) times daily as needed (knee pain).    Yes [provider]  metoprolol (LOPRESSOR) 50 MG tablet Take 50 mg by mouth 2 (two) times daily.  06/16/11  Yes Burtis Junes, NP  montelukast (SINGULAIR) 10 MG tablet Take 10 mg by mouth daily.  12/29/12  Yes [provider]  Multiple Vitamin (MULTIVITAMIN WITH MINERALS) TABS tablet Take 1 tablet by mouth daily.   Yes [provider]  Olmesartan-Amlodipine-HCTZ (TRIBENZOR) 40-5-12.5 MG TABS Take 1 tablet by mouth daily.     Yes [provider]  ondansetron (ZOFRAN-ODT) 4 MG disintegrating tablet Take 4 mg by mouth every 8 (eight) hours as needed for nausea or vomiting.   Yes [provider]  potassium chloride SA (K-DUR,KLOR-CON) 20 MEQ tablet Take 20 mEq by mouth at bedtime.  12/29/16  Yes [provider]  pravastatin (PRAVACHOL) 20 MG tablet Take 20 mg by mouth daily.     Yes [provider]  promethazine (PHENERGAN) 25 MG tablet Take 25 mg by mouth every 8 (eight) hours as needed for nausea or vomiting.   Yes [provider]  senna (SENOKOT) 8.6 MG TABS tablet Take 4 tablets by mouth daily as needed for mild constipation.    Yes [provider]  traMADol (ULTRAM) 50 MG tablet Take 50 mg by mouth 3 (three) times daily as needed for moderate pain.  01/06/17  Yes [provider]  Vitamin D, Ergocalciferol, (DRISDOL) 50000 UNITS CAPS Take 50,000 Units by mouth every Monday. In the evening.   Yes [provider]  PRESCRIPTION MEDICATION Inhale into the lungs at bedtime. CPAP    [provider]     Critical  care time:  36 minutes    Erick Colace ACNP-BC [place] Pager # [phone] OR # [phone] if no answer

## 2019-03-28 NOTE — ED Provider Notes (Signed)
[facility] EMERGENCY DEPARTMENT Provider Note   CSN: [id number] Arrival date & time: 03/28/19  [id number]     History   Chief Complaint Chief Complaint  Patient presents with   Abdominal Pain    HPI [name] is a 78 y.o. female.     Patient is a 78 year old female with past medical history of hypertension, prior CVA, SVT, CHF.  She presents today for evaluation of abdominal pain.  The patient describes severe epigastric pain that began yesterday morning and has worsened throughout the day and evening.  She reports nausea, but has not vomited.  She denies any bowel or bladder complaints.  She denies any fevers or chills.  The history is provided by the patient.  Abdominal Pain Pain location:  Epigastric Pain quality: stabbing   Pain radiates to:  Does not radiate Pain severity:  Severe Onset quality:  Sudden Timing:  Constant Progression:  Worsening Chronicity:  New Relieved by:  Nothing Worsened by:  Movement and palpation Ineffective treatments:  None tried   Past Medical History:  Diagnosis Date   Anxiety    Arthritis    Asthma    hx of   Back pain    Chronic constipation    Depression    hx of   Dyspnea    Family history of adverse reaction to anesthesia    DAD bp ELEVATED    GERD (gastroesophageal reflux disease)    High cholesterol    Hypertension    Lymphoma ([place])    Obesity    Sleep apnea    uses a CPAP   Stroke ([place])    2010   SVT (supraventricular tachycardia) ([place])     Patient Active Problem List   Diagnosis Date Noted   Severe hypoxemia 08/11/2018   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 ([place]) 06/14/2018   Moderate persistent asthma 06/14/2018   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   Sleeps in sitting position due to orthopnea 05/23/2018   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   Peptic ulcer with perforation ([employer]) 01/31/2017   Stroke ([place])  07/20/2011    Class: Acute   Nausea and vomiting 07/18/2011   Lateral nystagmus 07/18/2011    Class: Acute   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[place]) 07/18/2011    Class: History of   Hypertension 07/18/2011    Class: History of   Obesity 07/18/2011    Class: Chronic   SVT (supraventricular tachycardia) ([place]) 07/18/2011    Class: History of   Depression 07/18/2011    Class: History of   History of hypokalemia 07/18/2011    Class: History of   Back pain, chronic 07/18/2011    Class: Chronic   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    Class: History of   DISORDER OF BONE AND CARTILAGE UNSPECIFIED 04/29/2008    Past Surgical History:  Procedure Laterality Date   BACK SURGERY  2009   for lymphoma   BONE BIOPSY     CESAREAN SECTION     ESOPHAGOGASTRODUODENOSCOPY     ESOPHAGOGASTRODUODENOSCOPY (EGD) WITH PROPOFOL N/A 08/05/2017   Procedure: ESOPHAGOGASTRODUODENOSCOPY (EGD) WITH PROPOFOL;  Surgeon: Doran Stabler, MD;  Location: [facility];  Service: Gastroenterology;  Laterality: N/A;   EYE SURGERY     cataracts   LAPAROSCOPY N/A 02/01/2017   Procedure: LAPAROSCOPY DIAGNOSTIC;  Surgeon: Erroll Luna, MD;  Location: [facility];  Service: General;  Laterality: N/A;   REPLACEMENT TOTAL KNEE     right  TOTAL ABDOMINAL HYSTERECTOMY  1973     OB History   No obstetric history on file.      Home Medications    Prior to Admission medications   Medication Sig Start Date End Date Taking? Authorizing Provider  acetaminophen (TYLENOL) 500 MG tablet Take 1,000 mg by mouth 2 (two) times daily.     [provider]  albuterol (PROAIR HFA) 108 (90 Base) MCG/ACT inhaler Inhale 2 puffs into the lungs every 6 (six) hours as needed for wheezing or shortness of breath.     [provider]  albuterol (PROVENTIL) (2.5 MG/3ML) 0.083% nebulizer solution Take 2.5 mg by nebulization every 6 (six) hours as needed for wheezing or shortness of breath.     [provider]  ASPERCREME LIDOCAINE EX Apply 1 application topically 2 (two) times daily as needed (knee pain).     [provider]  Fluticasone-Umeclidin-Vilant (TRELEGY ELLIPTA) 100-62.5-25 MCG/INH AEPB Inhale 1 puff into the lungs daily.    [provider]  furosemide (LASIX) 40 MG tablet Take 40 mg by mouth daily as needed (for fluid retention.).     [provider]  lubiprostone (AMITIZA) 24 MCG capsule Take 24 mcg by mouth daily.     [provider]  Menthol, Topical Analgesic, (BIOFREEZE) 4 % GEL Apply 1 application topically 2 (two) times daily as needed (knee pain).     [provider]  metoprolol (LOPRESSOR) 50 MG tablet Take 50 mg by mouth 2 (two) times daily.  06/16/11   Burtis Junes, NP  montelukast (SINGULAIR) 10 MG tablet Take 10 mg by mouth daily.  12/29/12   [provider]  Multiple Vitamin (MULTIVITAMIN WITH MINERALS) TABS tablet Take 1 tablet by mouth daily.    [provider]  Olmesartan-Amlodipine-HCTZ (TRIBENZOR) 40-5-12.5 MG TABS Take 1 tablet by mouth daily.      [provider]  ondansetron (ZOFRAN-ODT) 4 MG disintegrating tablet Take 4 mg by mouth every 8 (eight) hours as needed for nausea or vomiting.    [provider]  potassium chloride SA (K-DUR,KLOR-CON) 20 MEQ tablet Take 20 mEq by mouth at bedtime.  12/29/16   [provider]  pravastatin (PRAVACHOL) 20 MG tablet Take 20 mg by mouth daily.      [provider]  PRESCRIPTION MEDICATION Inhale into the lungs at bedtime. CPAP    [provider]  promethazine (PHENERGAN) 25 MG tablet Take 25 mg by mouth every 8 (eight) hours as needed for nausea or vomiting.    [provider]  senna (SENOKOT) 8.6 MG TABS tablet Take 4 tablets by mouth daily as needed for mild constipation.     [provider]  traMADol (ULTRAM) 50 MG tablet Take 50 mg by mouth 3 (three) times daily as needed for  moderate pain.  01/06/17   [provider]  Vitamin D, Ergocalciferol, (DRISDOL) 50000 UNITS CAPS Take 50,000 Units by mouth every Monday. In the evening.    [provider]    Family History Family History  Problem Relation Age of Onset   Lymphoma Mother    Breast cancer Mother    Coronary artery disease Father 2       died   Stroke Father    Colon cancer Maternal Uncle     Social History Social History   Tobacco Use   Smoking status: Never Smoker   Smokeless tobacco: Never Used  Substance Use Topics   Alcohol use: No  Drug use: No     Allergies   Patient has no known allergies.   Review of Systems Review of Systems  Gastrointestinal: Positive for abdominal pain.  All other systems reviewed and are negative.    Physical Exam Updated Vital Signs BP (!) 154/136 (BP Location: Right Wrist)    Pulse 79    Temp (!) 97.5 F (36.4 C) (Oral)    Resp 20    SpO2 97%   Physical Exam Vitals signs and nursing note reviewed.  Constitutional:      General: She is not in acute distress.    Appearance: She is well-developed. She is not diaphoretic.  HENT:     Head: Normocephalic and atraumatic.  Neck:     Musculoskeletal: Normal range of motion and neck supple.  Cardiovascular:     Rate and Rhythm: Normal rate and regular rhythm.     Heart sounds: No murmur. No friction rub. No gallop.   Pulmonary:     Effort: Pulmonary effort is normal. No respiratory distress.     Breath sounds: Normal breath sounds. No wheezing.  Abdominal:     General: Bowel sounds are normal. There is no distension.     Palpations: Abdomen is soft.     Tenderness: There is abdominal tenderness in the epigastric area.     Comments: There is tenderness to palpation in the epigastric region.  Musculoskeletal: Normal range of motion.  Skin:    General: Skin is warm and dry.  Neurological:     Mental Status: She is alert and oriented to person, place, and time.      ED  Treatments / Results  Labs (all labs ordered are listed, but only abnormal results are displayed) Labs Reviewed  LIPASE, BLOOD  COMPREHENSIVE METABOLIC PANEL  CBC  URINALYSIS, ROUTINE W REFLEX MICROSCOPIC  TROPONIN I (HIGH SENSITIVITY)    EKG EKG Interpretation  Date/Time:  Wednesday March 28 2019 05:33:10 EDT Ventricular Rate:  75 PR Interval:    QRS Duration: 149 QT Interval:  444 QTC Calculation: 496 R Axis:   9 Text Interpretation:  Sinus rhythm Left bundle branch block Confirmed by Veryl Speak [phone]) on 03/28/2019 5:38:59 AM   Radiology No results found.  Procedures Procedures (including critical care time)  Medications Ordered in ED Medications  sodium chloride 0.9 % bolus 500 mL (has no administration in time range)  ondansetron (ZOFRAN) injection 4 mg (has no administration in time range)  morphine 4 MG/ML injection 4 mg (has no administration in time range)  ondansetron (ZOFRAN) injection 4 mg (4 mg Intravenous Given 03/28/19 0538)     Initial Impression / Assessment and Plan / ED Course  I have reviewed the triage vital signs and the nursing notes.  Pertinent labs & imaging results that were available during my care of the patient were reviewed by me and considered in my medical decision making (see chart for details).  Patient with history of perforated gastric ulcer presenting with complaints of abdominal pain.  This started yesterday morning and is rapidly worsening.  Patient's laboratory studies reveal a white count of 16,000.  She is awaiting a CT scan to determine the cause of her pain.  Care will be signed out to Dr. Wilson Singer who will obtain the results of the scan and determine the final disposition.  Final Clinical Impressions(s) / ED Diagnoses   Final diagnoses:  None    ED Discharge Orders    None       Thayer Inabinet,  Nathaneil Canary, MD 03/29/19 [phone]

## 2019-03-28 NOTE — ED Notes (Signed)
ED TO INPATIENT HANDOFF REPORT  ED Nurse Name and Phone #:      S Name/Age/Gender [name] 78 y.o. female Room/Bed: 012C/012C  Code Status   Code Status: Full Code  Home/SNF/Other Home Patient oriented to: self, place, time and situation Is this baseline? Yes   Triage Complete: Triage complete  Chief Complaint cp  Triage Note Pt brought to ED by [facility] from home for c/o 6/10 epigastric pain since yesterday morning with nausea and vomiting. BP 120/70. HR 78, r 21, SPO2 95% RA.   Allergies No Known Allergies  Level of Care/Admitting Diagnosis ED Disposition    ED Disposition Condition [facility] Area: [place] [[id number]]  Level of Care: ICU [6]  Covid Evaluation: Person Under Investigation (PUI)  Diagnosis: Perforated peptic ulcer [facility]) [id number]  Admitting Physician: Audria Nine [id number]  Attending Physician: Audria Nine [phone]  Estimated length of stay: 3 - 4 days  Certification:: I certify this patient will need inpatient services for at least 2 midnights  PT Class (Do Not Modify): Inpatient [101]  PT Acc Code (Do Not Modify): Private [1]       B Medical/Surgery History Past Medical History:  Diagnosis Date  . Anxiety   . Arthritis   . Asthma    hx of  . Back pain   . Chronic constipation   . Depression    hx of  . Dyspnea   . Family history of adverse reaction to anesthesia    DAD bp ELEVATED   . GERD (gastroesophageal reflux disease)   . High cholesterol   . Hypertension   . Lymphoma ([facility])   . Obesity   . Sleep apnea    uses a CPAP  . Stroke ([facility])    2010  . SVT (supraventricular tachycardia) ([facility])    Past Surgical History:  Procedure Laterality Date  . BACK SURGERY  2009   for lymphoma  . BONE BIOPSY    . CESAREAN SECTION    . ESOPHAGOGASTRODUODENOSCOPY    . ESOPHAGOGASTRODUODENOSCOPY (EGD) WITH PROPOFOL N/A 08/05/2017   Procedure: ESOPHAGOGASTRODUODENOSCOPY (EGD) WITH PROPOFOL;   Surgeon: Doran Stabler, MD;  Location: [facility];  Service: Gastroenterology;  Laterality: N/A;  . EYE SURGERY     cataracts  . LAPAROSCOPY N/A 02/01/2017   Procedure: LAPAROSCOPY DIAGNOSTIC;  Surgeon: Erroll Luna, MD;  Location: [facility];  Service: General;  Laterality: N/A;  . REPLACEMENT TOTAL KNEE     right  . TOTAL ABDOMINAL HYSTERECTOMY  1973     A IV Location/Drains/Wounds Patient Lines/Drains/Airways Status   Active Line/Drains/Airways    Name:   Placement date:   Placement time:   Site:   Days:   Peripheral IV 03/28/19 Left Wrist   03/28/19    0535    Wrist   less than 1   Peripheral IV 03/28/19 Left Antecubital   03/28/19    1015    Antecubital   less than 1   NG/OG Tube Nasogastric 12 Fr. Right nare Aucultation Documented cm marking at nare/ corner of mouth   03/28/19    1313    Right nare   less than 1   Incision (Closed) 02/01/17 Abdomen   02/01/17    1610     785   Incision - 3 Ports Abdomen Umbilicus Left;Superior Left;Lower   02/01/17    1608     785          Intake/Output Last  24 hours  Intake/Output Summary (Last 24 hours) at 03/28/2019 1325 Last data filed at 03/28/2019 1122 Gross per 24 hour  Intake 650 ml  Output -  Net 650 ml    Labs/Imaging Results for orders placed or performed during the hospital encounter of 03/28/19 (from the past 48 hour(s))  Lipase, blood     Status: None   Collection Time: 03/28/19  5:24 AM  Result Value Ref Range   Lipase 28 11 - 51 U/L    Comment: Performed at [facility], [address] [place], [place] [address]  Comprehensive metabolic panel     Status: Abnormal   Collection Time: 03/28/19  5:24 AM  Result Value Ref Range   Sodium 133 (L) 135 - 145 mmol/L   Potassium 5.1 3.5 - 5.1 mmol/L    Comment: HEMOLYSIS AT THIS LEVEL MAY AFFECT RESULT   Chloride 95 (L) 98 - 111 mmol/L   CO2 26 22 - 32 mmol/L   Glucose, Bld 144 (H) 70 - 99 mg/dL   BUN 21 8 - 23 mg/dL   Creatinine, Ser 0.95 0.44 - 1.00 mg/dL    Calcium 9.1 8.9 - 10.3 mg/dL   Total Protein 6.8 6.5 - 8.1 g/dL   Albumin 3.3 (L) 3.5 - 5.0 g/dL   AST 39 15 - 41 U/L   ALT 27 0 - 44 U/L   Alkaline Phosphatase 94 38 - 126 U/L   Total Bilirubin 2.0 (H) 0.3 - 1.2 mg/dL   GFR calc non Af Amer 57 (L) >60 mL/min   GFR calc Af Amer >60 >60 mL/min   Anion gap 12 5 - 15    Comment: Performed at [facility], [place] [address], [place], [place] [address]  CBC     Status: Abnormal   Collection Time: 03/28/19  5:24 AM  Result Value Ref Range   WBC 14.8 (H) 4.0 - 10.5 K/uL   RBC 4.55 3.87 - 5.11 MIL/uL   Hemoglobin 13.1 12.0 - 15.0 g/dL   HCT 42.2 36.0 - 46.0 %   MCV 92.7 80.0 - 100.0 fL   MCH 28.8 26.0 - 34.0 pg   MCHC 31.0 30.0 - 36.0 g/dL   RDW 14.6 11.5 - 15.5 %   Platelets 319 150 - 400 K/uL   nRBC 0.0 0.0 - 0.2 %    Comment: Performed at [facility], [place] [address], [place], [place] [address]  Troponin I (High Sensitivity)     Status: None   Collection Time: 03/28/19  5:40 AM  Result Value Ref Range   Troponin I (High Sensitivity) 6 <18 ng/L    Comment: (NOTE) Elevated high sensitivity troponin I (hsTnI) values and significant  changes across serial measurements may suggest ACS but many other  chronic and acute conditions are known to elevate hsTnI results.  Refer to the "Links" section for chest pain algorithms and additional  guidance. Performed at [facility], [place] [address] [place],  [address]   Urinalysis, Routine w reflex microscopic     Status: Abnormal   Collection Time: 03/28/19  8:09 AM  Result Value Ref Range   Color, Urine YELLOW YELLOW   APPearance CLEAR CLEAR   Specific Gravity, Urine 1.035 (H) 1.005 - 1.030   pH 5.0 5.0 - 8.0   Glucose, UA NEGATIVE NEGATIVE mg/dL   Hgb urine dipstick NEGATIVE NEGATIVE   Bilirubin Urine NEGATIVE NEGATIVE   Ketones, ur NEGATIVE NEGATIVE mg/dL  Protein, ur NEGATIVE NEGATIVE mg/dL   Nitrite POSITIVE (A) NEGATIVE   Leukocytes,Ua NEGATIVE NEGATIVE    RBC / HPF 0-5 0 - 5 RBC/hpf   WBC, UA 11-20 0 - 5 WBC/hpf   Bacteria, UA MANY (A) NONE SEEN   Squamous Epithelial / LPF 0-5 0 - 5   Mucus PRESENT     Comment: Performed at [facility], [place] [address], [place], [place] [address]  Type and screen [facility]     Status: None   Collection Time: 03/28/19  9:13 AM  Result Value Ref Range   ABO/RH(D) A POS    Antibody Screen NEG    Sample Expiration      03/31/2019,2359 Performed at [facility], [place] [address], [place], [place] [address]   Hemoglobin and hematocrit, blood     Status: Abnormal   Collection Time: 03/28/19 12:09 PM  Result Value Ref Range   Hemoglobin 10.0 (L) 12.0 - 15.0 g/dL   HCT 33.0 (L) 36.0 - 46.0 %    Comment: Performed at [facility], [place] [address], [place], [place] [address]   Ct Abdomen Pelvis W Contrast  Addendum Date: 03/28/2019   ADDENDUM REPORT: 03/28/2019 08:32 ADDENDUM: Study discussed by telephone with Dr. Wilson Singer in the ED on 03/28/2019 at 0822 hours. Electronically Signed   By: Genevie Ann M.D.   On: 03/28/2019 08:32   Result Date: 03/28/2019 CLINICAL DATA:  78 year old female with abdominal pain, nausea and vomiting since last night. EXAM: CT ABDOMEN AND PELVIS WITH CONTRAST TECHNIQUE: Multidetector CT imaging of the abdomen and pelvis was performed using the standard protocol following bolus administration of intravenous contrast. CONTRAST:  17mL ISOVUE-300 IOPAMIDOL (ISOVUE-300) INJECTION 61% COMPARISON:  CT Abdomen and Pelvis 01/31/2017. FINDINGS: Lower chest: Lung bases have not significantly changed with chronic lower lobe atelectasis or scarring. There is superimposed mosaic attenuation today, probably gas trapping. Stable mild cardiomegaly. No pericardial or pleural effusion. Hepatobiliary: 19 millimeter gallstones. No pericholecystic inflammation. Normal liver enhancement. No bile duct enlargement. Pancreas: Negative. Spleen: Negative. Adrenals/Urinary Tract: Negative  adrenal glands. Stable kidneys. Bilateral renal enhancement and contrast excretion is symmetric and normal. Small left renal parapelvic cysts. Unremarkable ureter. Stomach/Bowel: Negative rectum aside from retained stool. Small chronic calcified perirectal lymph node is unchanged. Mild diverticulosis in the sigmoid colon, no active inflammation. Negative descending colon. Redundant but otherwise negative transverse colon. Negative right colon. Diminutive appendix redemonstrated. Negative terminal ileum. No dilated small bowel. There is confluent inflammatory stranding along the distal stomach with trace pneumoperitoneum in the adjacent mesentery on series 4, image 40. This is a similar appearance to that in 2018. The proximal duodenum is relatively spared. There is no discrete gastric lesion. No other free air. Small phlegmon like nodularity in the adjacent mesentery up to 2 centimeters. Retained fluid in the proximal stomach. No free fluid. Vascular/Lymphatic: Aortoiliac calcified atherosclerosis. Major arterial structures are patent. Portal venous system is patent. Reproductive: Surgically absent uterus. Diminutive or absent ovaries. Other: No pelvic free fluid. Musculoskeletal: Widespread thoracolumbar compression fractures and multilevel prior vertebral augmentation. Chronic posterior spinal rods. Osteopenia. Degenerative changes also at the hips and pubic symphysis. No acute osseous abnormality identified. IMPRESSION: 1. Inflammation and trace pneumoperitoneum along the distal stomach, similar to the abnormal CT in 2018, compatible with contained Perforated Peptic Ulcer. Today the duodenum appears relatively spared. No discrete gastric lesion. No free fluid. 2. No other acute findings. Chronic findings include: Lung base scarring, cholelithiasis, osteopenia  with spinal compression fractures, aortic Atherosclerosis (ICD10-I70.0). Electronically Signed: By: Genevie Ann M.D. On: 03/28/2019 08:14    Pending  Labs Unresulted Labs (From admission, onward)    Start     Ordered   03/29/19 0500  CBC  Tomorrow morning,   R     03/28/19 0959   03/29/19 [id number]  Basic metabolic panel  Tomorrow morning,   R     03/28/19 1216   03/29/19 0500  Magnesium  Tomorrow morning,   R     03/28/19 1216   03/29/19 0500  Phosphorus  Tomorrow morning,   R     03/28/19 1216   03/28/19 1400  CBC  Now then every 6 hours,   R (with STAT occurrences)     03/28/19 1216   03/28/19 1254  Procalcitonin  Once,   R     03/28/19 1254   03/28/19 1215  Lactic acid, plasma  STAT Now then every 3 hours,   R (with STAT occurrences)     03/28/19 1216   03/28/19 0858  Urine culture  ONCE - STAT,   STAT     03/28/19 0857   03/28/19 0841  SARS CORONAVIRUS 2 (TAT 6-12 HRS) Nasal Swab Aptima Multi Swab  (Asymptomatic/Tier 2 Patients Labs)  Once,   STAT    Question Answer Comment  Is this test for diagnosis or screening Screening   Symptomatic for COVID-19 as defined by CDC No   Hospitalized for COVID-19 No   Admitted to ICU for COVID-19 No   Previously tested for COVID-19 No   Resident in a congregate (group) care setting No   Employed in healthcare setting No   Pregnant No      03/28/19 0840          Vitals/Pain Today's Vitals   03/28/19 1230 03/28/19 1245 03/28/19 1300 03/28/19 1315  BP: (!) 85/44 (!) 96/56 (!) 96/49 (!) 99/50  Pulse:  97 99 98  Resp: (!) 22 (!) 22 (!) 24 (!) 23  Temp:      TempSrc:      SpO2:  99% 98% 98%  Weight:      Height:      PainSc:        Isolation Precautions No active isolations  Medications Medications  ondansetron (ZOFRAN) injection 4 mg (0 mg Intravenous Hold 03/28/19 0547)  0.9 %  sodium chloride infusion (100 mL/hr Intravenous New Bag/Given 03/28/19 1102)  sodium chloride flush (NS) 0.9 % injection 3 mL (has no administration in time range)  pantoprazole (PROTONIX) 80 mg in sodium chloride 0.9 % 250 mL (0.32 mg/mL) infusion (8 mg/hr Intravenous New Bag/Given 03/28/19 1058)   pantoprazole (PROTONIX) injection 40 mg (has no administration in time range)  ondansetron (ZOFRAN) tablet 4 mg (has no administration in time range)    Or  ondansetron (ZOFRAN) injection 4 mg (has no administration in time range)  acetaminophen (TYLENOL) tablet 650 mg (has no administration in time range)    Or  acetaminophen (TYLENOL) suppository 650 mg (has no administration in time range)  magnesium sulfate IVPB 1 g 100 mL (1 g Intravenous New Bag/Given 03/28/19 1322)  piperacillin-tazobactam (ZOSYN) IVPB 3.375 g (has no administration in time range)  morphine 2 MG/ML injection 2 mg (has no administration in time range)  albuterol (PROVENTIL) (2.5 MG/3ML) 0.083% nebulizer solution 2.5 mg (has no administration in time range)  Fluticasone-Umeclidin-Vilant 100-62.5-25 MCG/INH AEPB 1 puff (has no administration in time range)  DULoxetine (CYMBALTA) DR  capsule 60 mg (has no administration in time range)  montelukast (SINGULAIR) tablet 10 mg (has no administration in time range)  ondansetron (ZOFRAN) injection 4 mg (4 mg Intravenous Given 03/28/19 0538)  sodium chloride 0.9 % bolus 500 mL (0 mLs Intravenous Stopped 03/28/19 0718)  morphine 4 MG/ML injection 4 mg (4 mg Intravenous Given 03/28/19 0546)  iopamidol (ISOVUE-300) 61 % injection 100 mL (100 mLs Intravenous Contrast Given 03/28/19 0743)  piperacillin-tazobactam (ZOSYN) IVPB 3.375 g (0 g Intravenous Stopped 03/28/19 1002)  ondansetron (ZOFRAN) injection 4 mg (4 mg Intravenous Given 03/28/19 0907)  morphine 4 MG/ML injection 4 mg (4 mg Intravenous Given 03/28/19 0903)  pantoprazole (PROTONIX) 80 mg in sodium chloride 0.9 % 100 mL IVPB (0 mg Intravenous Stopped 03/28/19 1122)  sodium chloride 0.9 % bolus 1,000 mL (1,000 mLs Intravenous New Bag/Given 03/28/19 1110)    Mobility walks Low fall risk   Focused Assessments Neuro Assessment Handoff:  Swallow screen pass? Yes          Neuro Assessment:   Neuro Checks:      Last  Documented NIHSS Modified Score:   Has TPA been given? No If patient is a Neuro Trauma and patient is going to OR before floor call report to [facility] nurse: [phone] or [phone]        R Recommendations: See Admitting Provider Note  Report given to:   Additional Notes:   NG placed, Protonix drip, received boluses for hypotension.

## 2019-03-28 NOTE — H&P (Addendum)
History and Physical    [name] [id number] DOB: 04-11-1941 DOA: 03/28/2019  Referring MD/NP/PA: Mila Merry, MD PCP: Leanna Battles, MD  Patient coming from: home via EMS  Chief Complaint: Abdominal pain  I have personally briefly reviewed patient's old medical records in [place]   HPI: [name] is a 78 y.o. female with medical history significant of hypertension, hyperlipidemia, obesity, diastolic dysfunction, CVA, SVT, lymphoma in remission, anxiety, depression, arthritis, GERD, and perforated gastric ulcer s/p laparoscopic wash with drain placement in 2018; who presents with complaints of progressively worsening epigastric abdominal pain since yesterday morning.  Associated symptoms include nausea, dry heaves, and now vomiting up blood.  Denies any significant fevers, cough, chest pain, diarrhea, shortness of breath, or loss of consciousness.   ED Course:  Upon admission into the emergency department patient was noted to be afebrile and blood pressures were initially maintained. Labs revealed WBC 14.8, sodium 133, chloride 95, BUN 21, and creatinine 0.9.  Urinalysis was positive for nitrites, many bacteria, 11-20 WBCs.  CT scan of the abdomen and pelvis revealed concern for perforated gastric ulcer.  Patient was witnessed having some hematemesis in the ED. Given morphine IV for pain, Zofran IV for nausea and vomiting, Protonix 80 mg IV, 500 mL of normal saline IV fluids, and Zosyn.  COVID-19 screening pending.  General surgery was consulted.  TRH called to admit.  Systolic blood pressure started to drop into the 70s thereafter and patient continued to vomit blood.  General surgery ordered NG tube placement.  Review of systems: A complete 10 point review of systems was performed and negative except for as noted above in HPI.  Past Medical History:  Diagnosis Date   Anxiety    Arthritis    Asthma    hx of   Back pain    Chronic constipation    Depression    hx of   Dyspnea    Family history of adverse reaction to anesthesia    DAD bp ELEVATED    GERD (gastroesophageal reflux disease)    High cholesterol    Hypertension    Lymphoma ([place])    Obesity    Sleep apnea    uses a CPAP   Stroke ([place])    2010   SVT (supraventricular tachycardia) ([place])     Past Surgical History:  Procedure Laterality Date   BACK SURGERY  2009   for lymphoma   BONE BIOPSY     CESAREAN SECTION     ESOPHAGOGASTRODUODENOSCOPY     ESOPHAGOGASTRODUODENOSCOPY (EGD) WITH PROPOFOL N/A 08/05/2017   Procedure: ESOPHAGOGASTRODUODENOSCOPY (EGD) WITH PROPOFOL;  Surgeon: Doran Stabler, MD;  Location: [facility];  Service: Gastroenterology;  Laterality: N/A;   EYE SURGERY     cataracts   LAPAROSCOPY N/A 02/01/2017   Procedure: LAPAROSCOPY DIAGNOSTIC;  Surgeon: Erroll Luna, MD;  Location: [facility];  Service: General;  Laterality: N/A;   REPLACEMENT TOTAL KNEE     right   TOTAL ABDOMINAL HYSTERECTOMY  1973     reports that she has never smoked. She has never used smokeless tobacco. She reports that she does not drink alcohol or use drugs.  No Known Allergies  Family History  Problem Relation Age of Onset   Lymphoma Mother    Breast cancer Mother    Coronary artery disease Father 52       died   Stroke Father    Colon cancer Maternal Uncle     Prior to  Admission medications   Medication Sig Start Date End Date Taking? Authorizing Provider  acetaminophen (TYLENOL) 500 MG tablet Take 1,000 mg by mouth 2 (two) times daily.    Yes [provider]  albuterol (PROAIR HFA) 108 (90 Base) MCG/ACT inhaler Inhale 2 puffs into the lungs every 6 (six) hours as needed for wheezing or shortness of breath.    Yes [provider]  albuterol (PROVENTIL) (2.5 MG/3ML) 0.083% nebulizer solution Take 2.5 mg by nebulization every 6 (six) hours as needed for wheezing or shortness of breath.   Yes [provider]  ASPERCREME  LIDOCAINE EX Apply 1 application topically 2 (two) times daily as needed (knee pain).    Yes [provider]  aspirin EC 81 MG tablet Take 81 mg by mouth daily.   Yes [provider]  DULoxetine (CYMBALTA) 60 MG capsule Take 60 mg by mouth daily.   Yes [provider]  Fluticasone-Umeclidin-Vilant (TRELEGY ELLIPTA) 100-62.5-25 MCG/INH AEPB Inhale 1 puff into the lungs daily.   Yes [provider]  furosemide (LASIX) 40 MG tablet Take 40 mg by mouth daily as needed (for fluid retention.).    Yes [provider]  lubiprostone (AMITIZA) 24 MCG capsule Take 24 mcg by mouth daily.    Yes [provider]  Menthol, Topical Analgesic, (BIOFREEZE) 4 % GEL Apply 1 application topically 2 (two) times daily as needed (knee pain).    Yes [provider]  metoprolol (LOPRESSOR) 50 MG tablet Take 50 mg by mouth 2 (two) times daily.  06/16/11  Yes Burtis Junes, NP  montelukast (SINGULAIR) 10 MG tablet Take 10 mg by mouth daily.  12/29/12  Yes [provider]  Multiple Vitamin (MULTIVITAMIN WITH MINERALS) TABS tablet Take 1 tablet by mouth daily.   Yes [provider]  Olmesartan-Amlodipine-HCTZ (TRIBENZOR) 40-5-12.5 MG TABS Take 1 tablet by mouth daily.     Yes [provider]  ondansetron (ZOFRAN-ODT) 4 MG disintegrating tablet Take 4 mg by mouth every 8 (eight) hours as needed for nausea or vomiting.   Yes [provider]  potassium chloride SA (K-DUR,KLOR-CON) 20 MEQ tablet Take 20 mEq by mouth at bedtime.  12/29/16  Yes [provider]  pravastatin (PRAVACHOL) 20 MG tablet Take 20 mg by mouth daily.     Yes [provider]  promethazine (PHENERGAN) 25 MG tablet Take 25 mg by mouth every 8 (eight) hours as needed for nausea or vomiting.   Yes [provider]  senna (SENOKOT) 8.6 MG TABS tablet Take 4 tablets by mouth daily as needed for mild constipation.    Yes [provider]    traMADol (ULTRAM) 50 MG tablet Take 50 mg by mouth 3 (three) times daily as needed for moderate pain.  01/06/17  Yes [provider]  Vitamin D, Ergocalciferol, (DRISDOL) 50000 UNITS CAPS Take 50,000 Units by mouth every Monday. In the evening.   Yes [provider]  PRESCRIPTION MEDICATION Inhale into the lungs at bedtime. CPAP    [provider]    Physical Exam:  Constitutional: Obese elderly female who appears acutely ill with bloody vomit present in emesis bag Vitals:   03/28/19 0525 03/28/19 0545 03/28/19 0547  BP: (!) 154/136 (!) 155/63   Pulse: 79 76   Resp: 20 17   Temp: (!) 97.5 F (36.4 C)    TempSrc: Oral    SpO2: 97% 100%   Weight:   119.3 kg  Height:   5'  1" (1.549 m)   Eyes: PERRL, lids and conjunctivae normal ENMT: Mucous membranes are moist. Posterior pharynx clear of any exudate or lesions.   Neck: normal, supple, no masses, no thyromegaly Respiratory: clear to auscultation bilaterally, no wheezing, no crackles. Normal respiratory effort. No accessory muscle use.  Cardiovascular: Regular rate and rhythm, no murmurs / rubs / gallops. No extremity edema. 2+ pedal pulses. No carotid bruits.  Abdomen: Epigastric tenderness present, no masses palpated. Bowel sounds positive.  Musculoskeletal: no clubbing / cyanosis. No joint deformity upper and lower extremities. Good ROM, no contractures. Normal muscle tone.  Skin: Pallor present.  No rashes, lesions, ulcers. No induration Neurologic: CN 2-12 grossly intact. Sensation intact, DTR normal. Strength 5/5 in all 4.  Psychiatric: Normal judgment and insight. Alert and oriented x 3. Normal mood.     Labs on Admission: I have personally reviewed following labs and imaging studies  CBC: Recent Labs  Lab 03/28/19 0524  WBC 14.8*  HGB 13.1  HCT 42.2  MCV 92.7  PLT 99991111   Basic Metabolic Panel: Recent Labs  Lab 03/28/19 0524  NA 133*  K 5.1  CL 95*  CO2 26  GLUCOSE 144*  BUN 21   CREATININE 0.95  CALCIUM 9.1   GFR: Estimated Creatinine Clearance: 57.9 mL/min (by C-G formula based on SCr of 0.95 mg/dL). Liver Function Tests: Recent Labs  Lab 03/28/19 0524  AST 39  ALT 27  ALKPHOS 94  BILITOT 2.0*  PROT 6.8  ALBUMIN 3.3*   Recent Labs  Lab 03/28/19 0524  LIPASE 28   No results for input(s): AMMONIA in the last 168 hours. Coagulation Profile: No results for input(s): INR, PROTIME in the last 168 hours. Cardiac Enzymes: No results for input(s): CKTOTAL, CKMB, CKMBINDEX, TROPONINI in the last 168 hours. BNP (last 3 results) No results for input(s): PROBNP in the last 8760 hours. HbA1C: No results for input(s): HGBA1C in the last 72 hours. CBG: No results for input(s): GLUCAP in the last 168 hours. Lipid Profile: No results for input(s): CHOL, HDL, LDLCALC, TRIG, CHOLHDL, LDLDIRECT in the last 72 hours. Thyroid Function Tests: No results for input(s): TSH, T4TOTAL, FREET4, T3FREE, THYROIDAB in the last 72 hours. Anemia Panel: No results for input(s): VITAMINB12, FOLATE, FERRITIN, TIBC, IRON, RETICCTPCT in the last 72 hours. Urine analysis:    Component Value Date/Time   COLORURINE YELLOW 03/28/2019 0809   APPEARANCEUR CLEAR 03/28/2019 0809   LABSPEC 1.035 (H) 03/28/2019 0809   PHURINE 5.0 03/28/2019 0809   GLUCOSEU NEGATIVE 03/28/2019 0809   HGBUR NEGATIVE 03/28/2019 0809   BILIRUBINUR NEGATIVE 03/28/2019 0809   KETONESUR NEGATIVE 03/28/2019 0809   PROTEINUR NEGATIVE 03/28/2019 0809   UROBILINOGEN 1.0 07/19/2011 0624   NITRITE POSITIVE (A) 03/28/2019 0809   LEUKOCYTESUR NEGATIVE 03/28/2019 0809   Sepsis Labs: No results found for this or any previous visit (from the past 240 hour(s)).   Radiological Exams on Admission: Ct Abdomen Pelvis W Contrast  Addendum Date: 03/28/2019   ADDENDUM REPORT: 03/28/2019 08:32 ADDENDUM: Study discussed by telephone with Dr. Wilson Singer in the ED on 03/28/2019 at 0822 hours. Electronically Signed   By: Genevie Ann  M.D.   On: 03/28/2019 08:32   Result Date: 03/28/2019 CLINICAL DATA:  78 year old female with abdominal pain, nausea and vomiting since last night. EXAM: CT ABDOMEN AND PELVIS WITH CONTRAST TECHNIQUE: Multidetector CT imaging of the abdomen and pelvis was performed using the standard protocol following bolus administration of intravenous contrast. CONTRAST:  123mL ISOVUE-300 IOPAMIDOL (  ISOVUE-300) INJECTION 61% COMPARISON:  CT Abdomen and Pelvis 01/31/2017. FINDINGS: Lower chest: Lung bases have not significantly changed with chronic lower lobe atelectasis or scarring. There is superimposed mosaic attenuation today, probably gas trapping. Stable mild cardiomegaly. No pericardial or pleural effusion. Hepatobiliary: 19 millimeter gallstones. No pericholecystic inflammation. Normal liver enhancement. No bile duct enlargement. Pancreas: Negative. Spleen: Negative. Adrenals/Urinary Tract: Negative adrenal glands. Stable kidneys. Bilateral renal enhancement and contrast excretion is symmetric and normal. Small left renal parapelvic cysts. Unremarkable ureter. Stomach/Bowel: Negative rectum aside from retained stool. Small chronic calcified perirectal lymph node is unchanged. Mild diverticulosis in the sigmoid colon, no active inflammation. Negative descending colon. Redundant but otherwise negative transverse colon. Negative right colon. Diminutive appendix redemonstrated. Negative terminal ileum. No dilated small bowel. There is confluent inflammatory stranding along the distal stomach with trace pneumoperitoneum in the adjacent mesentery on series 4, image 40. This is a similar appearance to that in 2018. The proximal duodenum is relatively spared. There is no discrete gastric lesion. No other free air. Small phlegmon like nodularity in the adjacent mesentery up to 2 centimeters. Retained fluid in the proximal stomach. No free fluid. Vascular/Lymphatic: Aortoiliac calcified atherosclerosis. Major arterial structures  are patent. Portal venous system is patent. Reproductive: Surgically absent uterus. Diminutive or absent ovaries. Other: No pelvic free fluid. Musculoskeletal: Widespread thoracolumbar compression fractures and multilevel prior vertebral augmentation. Chronic posterior spinal rods. Osteopenia. Degenerative changes also at the hips and pubic symphysis. No acute osseous abnormality identified. IMPRESSION: 1. Inflammation and trace pneumoperitoneum along the distal stomach, similar to the abnormal CT in 2018, compatible with contained Perforated Peptic Ulcer. Today the duodenum appears relatively spared. No discrete gastric lesion. No free fluid. 2. No other acute findings. Chronic findings include: Lung base scarring, cholelithiasis, osteopenia with spinal compression fractures, aortic Atherosclerosis (ICD10-I70.0). Electronically Signed: By: Genevie Ann M.D. On: 03/28/2019 08:14    EKG: Independently reviewed.  Sinus rhythm at 76 bpm with QTC 496  Assessment/Plan Perforated gastric ulcer: Acute.  Patient presents with complaints of abdominal pain.  CT scan shows signs of contained perforated peptic ulcer.  General surgery consulted. -Admit to a progressive bed -N.p.o. -Serial abdominal monitoring -Normal saline IV fluids at 75 mL/h -IV morphine as needed for pain -Appreciate general surgery consultative services, will follow-up for further recommendations  Hypotension: Acute.  Blood pressure is noted to be dropping blood pressure seem to be trending down as low as systolics of 76. -Bolus 1 L normal saline IV fluids  -Recheck H&H stat  -PCCM consulted to evaluate the patient given severity of symptoms.  Hematemesis: Acute.  Patient was witnessed after episode of vomiting bright red blood by the ED physician.  Hemoglobin appears stable at 13.1.  Given 80 mg of Protonix IV in the emergency department.  Patient was typed and screened for possible need of blood products. -Antiemetics as needed -Serial  monitoring of H&H -Protonix drip per pharmacy -NGT per general surgery -Appreciate GI consultative services, follow-up for further recommendations  Urinary tract infection: Urinalysis positive for signs of infection. -Follow-up urine culture  -Continue empiric antibiotics with Zosyn IV which also covers for infection from perforation  Leukocytosis: Acute.  WBC elevated at 14.8.  Suspect secondary to above. -Recheck CBC in a.m.  Hyponatremia: Acute.  Sodium 133 on admission. -Continue normal saline IV fluids  -Continue to monitor   DVT prophylaxis: SCD Code Status: Full Family Communication: Discussed plan of care with the family present at bedside Disposition Plan: To be determined Consults  called: Surgery Admission status: inpatient  Norval Morton MD [facility] Pager [phone]   If 7PM-7AM, please contact night-coverage [email or web address] Password [facility]  03/28/2019, 9:43 AM

## 2019-03-28 NOTE — ED Notes (Signed)
Dr Tamala Julian paged RE pt bp.

## 2019-03-28 NOTE — Consult Note (Addendum)
[facility] Consult: 11:06 AM 03/28/2019  LOS: 0 days    Referring Provider: Dr Harvest Forest  Primary Care Physician:  Leanna Battles, MD Primary Gastroenterologist:  Wilfrid Lund, MD   Reason for Consultation: Hematemesis.  Gastric perforation.   HPI: MCCAULEY MERGEN is a 78 y.o. female.  Hx hypertension.  Lymphoma.  OSA on CPAP.  Obesity.  Hypertension.  Small, pontine CVA 2012.  Asthma.  Postop V. tach 01/2017.  Chronic constipation, on Amitiza.  No previous colonoscopy.  Postoperative anemia, 10.3 Hgb, and 01/2017.  Osteopenia.  Spinal compression fractures.  Cholelithiasis. Perforated pylloric ulcer 01/2017.   01/2017 ex lap with mild, localized peritonitis, omentum stuck to first portion of duodenum and stuck on ulcer.  The ulcer was not repaired.  Drain placed.  Discharged on Protonix 40 mg BID.  02/01/2017 upper GI series.  No leak.  Original ulceration not well seen.  08/2017 EGD.  Follow-up perforated ulcer.  Previous H pylori negative, previous low-dose aspirin.  Normal esophagus, pylorus deformity with complete healing of previous ulcer.  Normal duodenum  Outpatient med list mentions 81 mg aspirin, prn Zofran, prn Phenergan, Amitiza, Senokot.  However there are no H2 blockers or PPI on med list.  Patient says her PMD advised her that it would be okay to stop taking ulcer medication ~ a year ago. No  problems with her upper GI tract since then.  She has a great appetite.  No significant heartburn.  No dysphasia.  No belly pain.  Concerning her chronic constipation, she with the daily Amitiza she has daily bowel movements.  Yesterday morning she developed acute epigastric pain.  EMS came to the house and evaluated her.  She was stable.  She did not want to go to the emergency room.  The pain persisted.  She developed  nausea and dry heaves.  Went to bed around 10:00 and then woke up at about 330 this morning with somewhat worsened epigastric pain.  She was agreeable for EMS to bring her to the ED. Blood pressure initially 154/136.  Blood pressure steadily declined.  She developed clear frothy emesis a couple of times and then proceeded to have medium volume bright red bloody hematemesis with blood pressure dropping to as low as 75/61.  Heart rate had been in the 90s, climbed to 101 at the time of maximum hypotension. Last BM was 2 d ago: brown.    WBCs 14.8.  Hgb 13.1.  MCV 92.  Platelets normal.   Incidentally urinalysis shows lots of bacteria and WBCs and + for nitrites. CTAP with contrast: Inflammation, trace pneumoperitoneum at distal stomach similar to CT in 01/2017.  Duodenum spared.  No discrete gastric lesions.  General surgery PA/C has seen patient.  Their recommendation is nonsurgical, conservative management.  Considering NG tube placement, but with GI bleed holding off on this.  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  Work has stopped with COVID. No EtOH.  No cigarettes. Family history negative for colon cancer, ulcer disease, anemia.   Past Medical  History:  Diagnosis Date  . Anxiety   . Arthritis   . Asthma    hx of  . Back pain   . Chronic constipation   . Depression    hx of  . Dyspnea   . Family history of adverse reaction to anesthesia    DAD bp ELEVATED   . GERD (gastroesophageal reflux disease)   . High cholesterol   . Hypertension   . Lymphoma ([place])   . Obesity   . Sleep apnea    uses a CPAP  . Stroke ([place])    2010  . SVT (supraventricular tachycardia) ([facility])     Past Surgical History:  Procedure Laterality Date  . BACK SURGERY  2009   for lymphoma  . BONE BIOPSY    . CESAREAN SECTION    . ESOPHAGOGASTRODUODENOSCOPY    . ESOPHAGOGASTRODUODENOSCOPY (EGD) WITH PROPOFOL N/A 08/05/2017   Procedure: ESOPHAGOGASTRODUODENOSCOPY (EGD) WITH PROPOFOL;   Surgeon: Doran Stabler, MD;  Location: [facility];  Service: Gastroenterology;  Laterality: N/A;  . EYE SURGERY     cataracts  . LAPAROSCOPY N/A 02/01/2017   Procedure: LAPAROSCOPY DIAGNOSTIC;  Surgeon: Erroll Luna, MD;  Location: [facility];  Service: General;  Laterality: N/A;  . REPLACEMENT TOTAL KNEE     right  . TOTAL ABDOMINAL HYSTERECTOMY  1973    Prior to Admission medications   Medication Sig Start Date End Date Taking? Authorizing Provider  acetaminophen (TYLENOL) 500 MG tablet Take 1,000 mg by mouth 2 (two) times daily.    Yes [provider]  albuterol (PROAIR HFA) 108 (90 Base) MCG/ACT inhaler Inhale 2 puffs into the lungs every 6 (six) hours as needed for wheezing or shortness of breath.    Yes [provider]  albuterol (PROVENTIL) (2.5 MG/3ML) 0.083% nebulizer solution Take 2.5 mg by nebulization every 6 (six) hours as needed for wheezing or shortness of breath.   Yes [provider]  ASPERCREME LIDOCAINE EX Apply 1 application topically 2 (two) times daily as needed (knee pain).    Yes [provider]  aspirin EC 81 MG tablet Take 81 mg by mouth daily.   Yes [provider]  DULoxetine (CYMBALTA) 60 MG capsule Take 60 mg by mouth daily.   Yes [provider]  Fluticasone-Umeclidin-Vilant (TRELEGY ELLIPTA) 100-62.5-25 MCG/INH AEPB Inhale 1 puff into the lungs daily.   Yes [provider]  furosemide (LASIX) 40 MG tablet Take 40 mg by mouth daily as needed (for fluid retention.).    Yes [provider]  lubiprostone (AMITIZA) 24 MCG capsule Take 24 mcg by mouth daily.    Yes [provider]  Menthol, Topical Analgesic, (BIOFREEZE) 4 % GEL Apply 1 application topically 2 (two) times daily as needed (knee pain).    Yes [provider]  metoprolol (LOPRESSOR) 50 MG tablet Take 50 mg by mouth 2 (two) times daily.  06/16/11  Yes Burtis Junes, NP  montelukast (SINGULAIR) 10 MG tablet  Take 10 mg by mouth daily.  12/29/12  Yes [provider]  Multiple Vitamin (MULTIVITAMIN WITH MINERALS) TABS tablet Take 1 tablet by mouth daily.   Yes [provider]  Olmesartan-Amlodipine-HCTZ (TRIBENZOR) 40-5-12.5 MG TABS Take 1 tablet by mouth daily.     Yes [provider]  ondansetron (ZOFRAN-ODT) 4 MG disintegrating tablet Take 4 mg by mouth every 8 (eight) hours as needed for nausea or vomiting.   Yes [provider]  potassium chloride SA (K-DUR,KLOR-CON) 20  MEQ tablet Take 20 mEq by mouth at bedtime.  12/29/16  Yes [provider]  pravastatin (PRAVACHOL) 20 MG tablet Take 20 mg by mouth daily.     Yes [provider]  promethazine (PHENERGAN) 25 MG tablet Take 25 mg by mouth every 8 (eight) hours as needed for nausea or vomiting.   Yes [provider]  senna (SENOKOT) 8.6 MG TABS tablet Take 4 tablets by mouth daily as needed for mild constipation.    Yes [provider]  traMADol (ULTRAM) 50 MG tablet Take 50 mg by mouth 3 (three) times daily as needed for moderate pain.  01/06/17  Yes [provider]  Vitamin D, Ergocalciferol, (DRISDOL) 50000 UNITS CAPS Take 50,000 Units by mouth every Monday. In the evening.   Yes [provider]  PRESCRIPTION MEDICATION Inhale into the lungs at bedtime. CPAP    [provider]    Scheduled Meds: . ondansetron (ZOFRAN) IV  4 mg Intravenous Once  . [START ON 03/31/2019] pantoprazole  40 mg Intravenous Q12H  . sodium chloride flush  3 mL Intravenous Q12H   Infusions: . sodium chloride 100 mL/hr (03/28/19 1102)  . magnesium sulfate bolus IVPB    . pantoprazole (PROTONIX) IV 80 mg (03/28/19 1101)  . pantoprozole (PROTONIX) infusion 8 mg/hr (03/28/19 1058)  . piperacillin-tazobactam (ZOSYN)  IV    . sodium chloride     PRN Meds: acetaminophen **OR** acetaminophen, albuterol, morphine injection, ondansetron **OR** ondansetron (ZOFRAN) IV   Allergies  as of 03/28/2019  . (No Known Allergies)    Family History  Problem Relation Age of Onset  . Lymphoma Mother   . Breast cancer Mother   . Coronary artery disease Father 38       died  . Stroke Father   . Colon cancer Maternal Uncle     Social History   Socioeconomic History  . Marital status: Widowed    Spouse name: Not on file  . Number of children: 5  . Years of education: Not on file  . Highest education level: Not on file  Occupational History  . Occupation: Chemical engineer  Social Needs  . Financial resource strain: Not on file  . Food insecurity    Worry: Not on file    Inability: Not on file  . Transportation needs    Medical: Not on file    Non-medical: Not on file  Tobacco Use  . Smoking status: Never Smoker  . Smokeless tobacco: Never Used  Substance and Sexual Activity  . Alcohol use: No  . Drug use: No  . Sexual activity: Not Currently  Lifestyle  . Physical activity    Days per week: Not on file    Minutes per session: Not on file  . Stress: Not on file  Relationships  . Social Herbalist on phone: Not on file    Gets together: Not on file    Attends religious service: Not on file    Active member of club or organization: Not on file    Attends meetings of clubs or organizations: Not on file    Relationship status: Not on file  . Intimate partner violence    Fear of current or ex partner: Not on file    Emotionally abused: Not on file    Physically abused: Not on file    Forced sexual activity: Not on file  Other Topics Concern  . Not on file  Social History Narrative  .  Not on file    REVIEW OF SYSTEMS: Constitutional: Generally no weakness.  No fatigue. ENT:  No nose bleeds Pulm: No shortness of breath.  Occasional asthma flare when the weather changes. CV:  No palpitations, no LE edema.  No chest pain. GU:  No hematuria, no frequency GI: See HPI. Heme: Bruises easily but no excessive bleeding.  No significant hematomas.  Transfusions: None. Neuro: No syncope, no seizures.  No dizziness.  No headaches, no peripheral tingling or numbness Derm:  No itching, no rash or sores.  Endocrine:  No sweats or chills.  No polyuria or dysuria Immunization: Not queried. Travel:  None beyond local counties in last few months.    PHYSICAL EXAM: Vital signs in last 24 hours: Vitals:   03/28/19 1015 03/28/19 1045  BP: (!) 109/56 (!) 91/52  Pulse: 99 98  Resp: (!) 24 (!) 21  Temp:    SpO2: 99% 99%   Wt Readings from Last 3 Encounters:  03/28/19 119.3 kg  04/27/18 124.3 kg  08/05/17 122.5 kg    General: Obese, alert.  Somewhat chronically ill looking WF.  No distress.  Comfortable. Head: No facial asymmetry or swelling.  No signs of head trauma. Eyes: No scleral icterus.  No conjunctival pallor.  EOMI. Ears: Not hard of hearing Nose: No discharge or congestion. Mouth: Moist, pink oral mucosa.  Burgundy phlegm sitting on the tongue.  No oral lesions.  Tongue midline. Neck: No JVD, no masses, no thyromegaly. Lungs: A few dry crackles in the right base otherwise clear.  No labored breathing.  No cough. Heart: RRR.  No MRG.  S1, S2 present. Abdomen: Slight epigastric tenderness.  Bowel sounds normal but hypoactive.  Soft, obese.  No HSM, masses, bruits, hernias appreciated..   Rectal: Deferred Musc/Skeltl: No joint redness or swelling. Extremities: No CCE.  Palpable dorsalis pedal pulses bilaterally. Neurologic: Oriented x3.  Moves all 4 limbs.  No tremors. Skin: No rash, sores, suspicious lesions. Tattoos: None Nodes: No cervical adenopathy Psych: Calm, affect flat, cooperative.  Drowsy.  Alert/oriented x3.  Intake/Output from previous day: No intake/output data recorded. Intake/Output this shift: Total I/O In: 550 [IV Piggyback:550] Out: -   LAB RESULTS: Recent Labs    03/28/19 0524  WBC 14.8*  HGB 13.1  HCT 42.2  PLT 319   BMET Lab Results  Component Value Date   NA 133 (L) 03/28/2019   NA  137 02/09/2017   NA 135 02/08/2017   K 5.1 03/28/2019   K 3.5 02/09/2017   K 3.2 (L) 02/08/2017   CL 95 (L) 03/28/2019   CL 98 (L) 02/09/2017   CL 98 (L) 02/08/2017   CO2 26 03/28/2019   CO2 30 02/09/2017   CO2 30 02/08/2017   GLUCOSE 144 (H) 03/28/2019   GLUCOSE 104 (H) 02/09/2017   GLUCOSE 98 02/08/2017   BUN 21 03/28/2019   BUN 11 02/09/2017   BUN 8 02/08/2017   CREATININE 0.95 03/28/2019   CREATININE 0.94 02/09/2017   CREATININE 0.83 02/08/2017   CALCIUM 9.1 03/28/2019   CALCIUM 8.2 (L) 02/09/2017   CALCIUM 8.1 (L) 02/08/2017   LFT Recent Labs    03/28/19 0524  PROT 6.8  ALBUMIN 3.3*  AST 39  ALT 27  ALKPHOS 94  BILITOT 2.0*   PT/INR Lab Results  Component Value Date   INR 1.13 02/01/2017   INR 0.97 07/17/2011   INR 1.0 05/10/2008   Hepatitis Panel No results for input(s): HEPBSAG, HCVAB, [place], HEPBIGM in  the last 72 hours. C-Diff No components found for: CDIFF Lipase     Component Value Date/Time   LIPASE 28 03/28/2019 0524    Drugs of Abuse  No results found for: LABOPIA, COCAINSCRNUR, LABBENZ, AMPHETMU, THCU, LABBARB   RADIOLOGY STUDIES: Ct Abdomen Pelvis W Contrast  Addendum Date: 03/28/2019   ADDENDUM REPORT: 03/28/2019 08:32 ADDENDUM: Study discussed by telephone with Dr. Wilson Singer in the ED on 03/28/2019 at 0822 hours. Electronically Signed   By: Genevie Ann M.D.   On: 03/28/2019 08:32   Result Date: 03/28/2019 CLINICAL DATA:  78 year old female with abdominal pain, nausea and vomiting since last night. EXAM: CT ABDOMEN AND PELVIS WITH CONTRAST TECHNIQUE: Multidetector CT imaging of the abdomen and pelvis was performed using the standard protocol following bolus administration of intravenous contrast. CONTRAST:  175mL ISOVUE-300 IOPAMIDOL (ISOVUE-300) INJECTION 61% COMPARISON:  CT Abdomen and Pelvis 01/31/2017. FINDINGS: Lower chest: Lung bases have not significantly changed with chronic lower lobe atelectasis or scarring. There is superimposed mosaic  attenuation today, probably gas trapping. Stable mild cardiomegaly. No pericardial or pleural effusion. Hepatobiliary: 19 millimeter gallstones. No pericholecystic inflammation. Normal liver enhancement. No bile duct enlargement. Pancreas: Negative. Spleen: Negative. Adrenals/Urinary Tract: Negative adrenal glands. Stable kidneys. Bilateral renal enhancement and contrast excretion is symmetric and normal. Small left renal parapelvic cysts. Unremarkable ureter. Stomach/Bowel: Negative rectum aside from retained stool. Small chronic calcified perirectal lymph node is unchanged. Mild diverticulosis in the sigmoid colon, no active inflammation. Negative descending colon. Redundant but otherwise negative transverse colon. Negative right colon. Diminutive appendix redemonstrated. Negative terminal ileum. No dilated small bowel. There is confluent inflammatory stranding along the distal stomach with trace pneumoperitoneum in the adjacent mesentery on series 4, image 40. This is a similar appearance to that in 2018. The proximal duodenum is relatively spared. There is no discrete gastric lesion. No other free air. Small phlegmon like nodularity in the adjacent mesentery up to 2 centimeters. Retained fluid in the proximal stomach. No free fluid. Vascular/Lymphatic: Aortoiliac calcified atherosclerosis. Major arterial structures are patent. Portal venous system is patent. Reproductive: Surgically absent uterus. Diminutive or absent ovaries. Other: No pelvic free fluid. Musculoskeletal: Widespread thoracolumbar compression fractures and multilevel prior vertebral augmentation. Chronic posterior spinal rods. Osteopenia. Degenerative changes also at the hips and pubic symphysis. No acute osseous abnormality identified. IMPRESSION: 1. Inflammation and trace pneumoperitoneum along the distal stomach, similar to the abnormal CT in 2018, compatible with contained Perforated Peptic Ulcer. Today the duodenum appears relatively spared.  No discrete gastric lesion. No free fluid. 2. No other acute findings. Chronic findings include: Lung base scarring, cholelithiasis, osteopenia with spinal compression fractures, aortic Atherosclerosis (ICD10-I70.0). Electronically Signed: By: Genevie Ann M.D. On: 03/28/2019 08:14     IMPRESSION:   *    Contained pyloric/distal gastric ulcer. History perforated pyloric ulcer 01/2017.  Underwent surgery but did not require repair of ulcer. Site of ulcer healed with some deformity noted on EGD 08/2017. Unfortunately she has not been taking any acid controlling medications for at least 1 year. IV Protonix drip and Zosyn in place.  *     Hematemesis, hypotension following multiple episodes of dry heaves, a couple of episodes of clear emesis. ?   MWT versus bleeding from ulcer.  *    Suspect UTI.  Bacteriuria, leukosuria, positive nitrite.  Zosyn in place  *   OSA, on CPAP.  Asthma.  Currently pulmonary status seems well compensated.  PLAN:     *    Continue IV  Protonix.  Antinauseals.    Recheck Hgb, currently q 6 hour CBC in place.    *    NPO for now.    *   Dr. Fuller Plan will be seeing the patient.  Given the tenuous state of the ulcer, it is best if we avoid endoscopy which could turn a contained perforation into an open perforation.  *   Covid 19 rapid test in progress.      Azucena Freed  03/28/2019, 11:06 AM Phone [phone]     Attending Physician Note   I have taken an interval history, reviewed the chart and examined the patient. I agree with the Advanced Practitioner's note, impression and recommendations.   Pyloric channel / distal gastric ulcer with a contained perforation and UGI bleeding R/O ulcer bleed, MW tear History of perforated ulcer in 2018  General Surgery following ICU care for now IV antibiotics  IV pantoprazole infusion  Trend CBC No plans for EGD at this time as it could lead to a free perforation, peritonitis Reassess risks / benefits of EGD if UGI  bleeding persists or worsens  Lucio Edward, MD [facility]

## 2019-03-28 NOTE — ED Notes (Addendum)
Critical care, Peter MD consult at desk discussing care and vitals.

## 2019-03-29 ENCOUNTER — Inpatient Hospital Stay (HOSPITAL_COMMUNITY): Payer: Medicare Other

## 2019-03-29 DIAGNOSIS — I959 Hypotension, unspecified: Secondary | ICD-10-CM

## 2019-03-29 DIAGNOSIS — N390 Urinary tract infection, site not specified: Secondary | ICD-10-CM

## 2019-03-29 DIAGNOSIS — G9340 Encephalopathy, unspecified: Secondary | ICD-10-CM

## 2019-03-29 LAB — CBC
HCT: 22.4 % — ABNORMAL LOW (ref 36.0–46.0)
HCT: 26.7 % — ABNORMAL LOW (ref 36.0–46.0)
Hemoglobin: 7 g/dL — ABNORMAL LOW (ref 12.0–15.0)
Hemoglobin: 8.4 g/dL — ABNORMAL LOW (ref 12.0–15.0)
MCH: 29.2 pg (ref 26.0–34.0)
MCH: 29.4 pg (ref 26.0–34.0)
MCHC: 31.3 g/dL (ref 30.0–36.0)
MCHC: 31.5 g/dL (ref 30.0–36.0)
MCV: 93.3 fL (ref 80.0–100.0)
MCV: 93.4 fL (ref 80.0–100.0)
Platelets: 272 10*3/uL (ref 150–400)
Platelets: 278 10*3/uL (ref 150–400)
RBC: 2.4 MIL/uL — ABNORMAL LOW (ref 3.87–5.11)
RBC: 2.86 MIL/uL — ABNORMAL LOW (ref 3.87–5.11)
RDW: 14.7 % (ref 11.5–15.5)
RDW: 15 % (ref 11.5–15.5)
WBC: 14.3 10*3/uL — ABNORMAL HIGH (ref 4.0–10.5)
WBC: 16 10*3/uL — ABNORMAL HIGH (ref 4.0–10.5)
nRBC: 0 % (ref 0.0–0.2)
nRBC: 0 % (ref 0.0–0.2)

## 2019-03-29 LAB — BASIC METABOLIC PANEL
Anion gap: 6 (ref 5–15)
BUN: 35 mg/dL — ABNORMAL HIGH (ref 8–23)
CO2: 25 mmol/L (ref 22–32)
Calcium: 7.4 mg/dL — ABNORMAL LOW (ref 8.9–10.3)
Chloride: 106 mmol/L (ref 98–111)
Creatinine, Ser: 0.88 mg/dL (ref 0.44–1.00)
GFR calc Af Amer: >60 mL/min (ref >60)
GFR calc non Af Amer: >60 mL/min (ref >60)
Glucose, Bld: 112 mg/dL — ABNORMAL HIGH (ref 70–99)
Potassium: 4.1 mmol/L (ref 3.5–5.1)
Sodium: 137 mmol/L (ref 135–145)

## 2019-03-29 LAB — HEMOGLOBIN AND HEMATOCRIT, BLOOD
HCT: 33 % — ABNORMAL LOW (ref 36.0–46.0)
Hemoglobin: 10 g/dL — ABNORMAL LOW (ref 12.0–15.0)

## 2019-03-29 LAB — MAGNESIUM: Magnesium: 1.8 mg/dL (ref 1.7–2.4)

## 2019-03-29 LAB — PHOSPHORUS: Phosphorus: 3.6 mg/dL (ref 2.5–4.6)

## 2019-03-29 LAB — PREPARE RBC (CROSSMATCH)

## 2019-03-29 MED ORDER — MORPHINE SULFATE (PF) 2 MG/ML IV SOLN
2.0000 mg | INTRAVENOUS | Status: DC | PRN
  Administered 2019-03-29 – 2019-03-30 (×4): 2 mg via INTRAVENOUS
  Filled 2019-03-29 (×4): qty 1

## 2019-03-29 MED ORDER — SODIUM CHLORIDE 0.9% FLUSH: 10.0000 mL | INTRAVENOUS | Status: DC | PRN

## 2019-03-29 MED ORDER — SODIUM CHLORIDE 0.9% IV SOLUTION
Freq: Once | INTRAVENOUS | Status: AC
  Administered 2019-03-29: 10:00:00 via INTRAVENOUS

## 2019-03-29 NOTE — Progress Notes (Signed)
[facility] Progress Note     Subjective: CC: UGI bleed Patient with lots of clots from NGT, nursing having to flush frequently. Complained of some nausea this AM. Abdominal pain on the left side. Appears tired this AM.   Objective: Vital signs in last 24 hours: Temp:  [98.2 F (36.8 C)-99.8 F (37.7 C)] 98.2 F (36.8 C) (08/27 0805) Pulse Rate:  [89-119] 117 (08/27 0800) Resp:  [17-26] 22 (08/27 0800) BP: (75-151)/(29-137) 83/45 (08/26 2200) SpO2:  [88 %-99 %] 95 % (08/27 0800) Arterial Line BP: (80-157)/(31-46) 142/39 (08/27 0800) Weight:  [130.9 kg] 130.9 kg (08/27 0354) Last BM Date: (PTA)  Intake/Output from previous day: 08/26 0701 - 08/27 0700 In: 3711.1 [I.V.:1796.8; IV Piggyback:1914.2] Out: 710 [Urine:710] Intake/Output this shift: No intake/output data recorded.  PE: Gen:  Resting but easily awakened Card:  Sinus tachycardia Pulm:  Normal effort, clear to auscultation bilaterally Abd: Soft, TTP in LUQ, no guarding, no peritonitis, non-distended, +BS Skin: warm and dry, no rashes  Psych: A&Ox3   Lab Results:  Recent Labs    03/28/19 0524  03/28/19 1847 03/29/19 0358  WBC 14.8*  --   --  14.3*  HGB 13.1   < > 9.0* 7.0*  HCT 42.2   < > 29.1* 22.4*  PLT 319  --   --  272   < > = values in this interval not displayed.   BMET Recent Labs    03/28/19 0524 03/29/19 0358  NA 133* 137  K 5.1 4.1  CL 95* 106  CO2 26 25  GLUCOSE 144* 112*  BUN 21 35*  CREATININE 0.95 0.88  CALCIUM 9.1 7.4*   PT/INR No results for input(s): LABPROT, INR in the last 72 hours. CMP     Component Value Date/Time   NA 137 03/29/2019 0358   K 4.1 03/29/2019 0358   CL 106 03/29/2019 0358   CO2 25 03/29/2019 0358   GLUCOSE 112 (H) 03/29/2019 0358   BUN 35 (H) 03/29/2019 0358   CREATININE 0.88 03/29/2019 0358   CALCIUM 7.4 (L) 03/29/2019 0358   PROT 6.8 03/28/2019 0524   ALBUMIN 3.3 (L) 03/28/2019 0524   AST 39 03/28/2019 0524   ALT 27 03/28/2019 0524    ALKPHOS 94 03/28/2019 0524   BILITOT 2.0 (H) 03/28/2019 0524   GFRNONAA >60 03/29/2019 0358   GFRAA >60 03/29/2019 0358   Lipase     Component Value Date/Time   LIPASE 28 03/28/2019 0524       Studies/Results: Ct Abdomen Pelvis W Contrast  Addendum Date: 03/28/2019   ADDENDUM REPORT: 03/28/2019 08:32 ADDENDUM: Study discussed by telephone with Dr. Wilson Singer in the ED on 03/28/2019 at 0822 hours. Electronically Signed   By: Genevie Ann M.D.   On: 03/28/2019 08:32   Result Date: 03/28/2019 CLINICAL DATA:  78 year old female with abdominal pain, nausea and vomiting since last night. EXAM: CT ABDOMEN AND PELVIS WITH CONTRAST TECHNIQUE: Multidetector CT imaging of the abdomen and pelvis was performed using the standard protocol following bolus administration of intravenous contrast. CONTRAST:  12mL ISOVUE-300 IOPAMIDOL (ISOVUE-300) INJECTION 61% COMPARISON:  CT Abdomen and Pelvis 01/31/2017. FINDINGS: Lower chest: Lung bases have not significantly changed with chronic lower lobe atelectasis or scarring. There is superimposed mosaic attenuation today, probably gas trapping. Stable mild cardiomegaly. No pericardial or pleural effusion. Hepatobiliary: 19 millimeter gallstones. No pericholecystic inflammation. Normal liver enhancement. No bile duct enlargement. Pancreas: Negative. Spleen: Negative. Adrenals/Urinary Tract: Negative adrenal glands. Stable kidneys. Bilateral renal  enhancement and contrast excretion is symmetric and normal. Small left renal parapelvic cysts. Unremarkable ureter. Stomach/Bowel: Negative rectum aside from retained stool. Small chronic calcified perirectal lymph node is unchanged. Mild diverticulosis in the sigmoid colon, no active inflammation. Negative descending colon. Redundant but otherwise negative transverse colon. Negative right colon. Diminutive appendix redemonstrated. Negative terminal ileum. No dilated small bowel. There is confluent inflammatory stranding along the distal  stomach with trace pneumoperitoneum in the adjacent mesentery on series 4, image 40. This is a similar appearance to that in 2018. The proximal duodenum is relatively spared. There is no discrete gastric lesion. No other free air. Small phlegmon like nodularity in the adjacent mesentery up to 2 centimeters. Retained fluid in the proximal stomach. No free fluid. Vascular/Lymphatic: Aortoiliac calcified atherosclerosis. Major arterial structures are patent. Portal venous system is patent. Reproductive: Surgically absent uterus. Diminutive or absent ovaries. Other: No pelvic free fluid. Musculoskeletal: Widespread thoracolumbar compression fractures and multilevel prior vertebral augmentation. Chronic posterior spinal rods. Osteopenia. Degenerative changes also at the hips and pubic symphysis. No acute osseous abnormality identified. IMPRESSION: 1. Inflammation and trace pneumoperitoneum along the distal stomach, similar to the abnormal CT in 2018, compatible with contained Perforated Peptic Ulcer. Today the duodenum appears relatively spared. No discrete gastric lesion. No free fluid. 2. No other acute findings. Chronic findings include: Lung base scarring, cholelithiasis, osteopenia with spinal compression fractures, aortic Atherosclerosis (ICD10-I70.0). Electronically Signed: By: Genevie Ann M.D. On: 03/28/2019 08:14    Anti-infectives: Anti-infectives (From admission, onward)   Start     Dose/Rate Route Frequency Ordered Stop   03/28/19 1500  piperacillin-tazobactam (ZOSYN) IVPB 3.375 g     3.375 g 12.5 mL/hr over 240 Minutes Intravenous Every 8 hours 03/28/19 1004     03/28/19 1400  piperacillin-tazobactam (ZOSYN) IVPB 3.375 g  Status:  Discontinued     3.375 g 100 mL/hr over 30 Minutes Intravenous Every 8 hours 03/28/19 1001 03/28/19 1003   03/28/19 0845  piperacillin-tazobactam (ZOSYN) IVPB 3.375 g     3.375 g 100 mL/hr over 30 Minutes Intravenous  Once 03/28/19 0840 03/28/19 1002        Assessment/Plan Asthma H/O CVA H/O lymphoma HTN H/O contained gastric perforation in 2018  Contained gastric ulcer perforation with hematemesis  - patient appears to have a contained gastric ulcer perforation at the same site as previously noted in 2018.   - hgb down to 7.0 this AM, bloody drainage from NGT, transfuse 1 unit PRBC - if continuing to dropp hgb, she may require GI or IR for intervention of bleeding - continue PPI gtt - patient TTP over L abdomen but no peritonitis, no indication for emergent surgical intervention - if unable to manage bleeding with supportive therapy and more conservative measures, may require surgical intervention - we will follow closely   FEN - NPO/NGT/IVFs/protonix gtt VTE - would hold given hematemesis ID - zosyn 8/26 >>  LOS: 1 day    Brigid Re , [facility] 03/29/2019, 8:10 AM Pager: [phone] Consults: [phone] 7:00 AM - 4:30 PM M, W-F 7:00 AM - 11:30 AM Tues, Sat, Sun

## 2019-03-29 NOTE — Progress Notes (Signed)
Pt unable to wear cpap due to NG tube being in place. RT will continue to monitor as needed.

## 2019-03-29 NOTE — Progress Notes (Addendum)
Daily Rounding Note  03/29/2019, 10:55 AM  LOS: 1 day   SUBJECTIVE:   Chief complaint:    Contained perforation of gastric ulcer. Clots of blood have been coming from the NG tube.  Some nausea this morning.  Left abdominal pain. Thick burgundy bloody material has been coming out the NG tube.  However in the last few hours nurse has flush the tube and attempted to manually suction but nothing comes back.  OBJECTIVE:         Vital signs in last 24 hours:    Temp:  [98.2 F (36.8 C)-99.8 F (37.7 C)] 99.5 F (37.5 C) (08/27 1020) Pulse Rate:  [97-126] 117 (08/27 1050) Resp:  [18-25] 21 (08/27 1050) BP: (75-151)/(29-137) 147/41 (08/27 1004) SpO2:  [88 %-99 %] 96 % (08/27 1050) Arterial Line BP: (80-157)/(31-46) 138/44 (08/27 1050) Weight:  [130.9 kg] 130.9 kg (08/27 0354) Last BM Date: (PTA) Filed Weights   03/28/19 0547 03/29/19 0354  Weight: 119.3 kg 130.9 kg   General: Sleepy, awakens to name.  Does not have a lot to say.  Looks ill. Heart: Regular, tacky in the 120s. Chest: Reduced breath sounds with some moderate increased work of breathing. Abdomen: Obese, soft.  Tenderness on the right side but focally tender in the medial aspect of right upper quadrant Extremities: No CCE.  Feet are warm. Neuro/Psych: Not talking a lot.  Follows simple commands.  Moves all limbs.  Intake/Output from previous day: 08/26 0701 - 08/27 0700 In: 3711.1 [I.V.:1796.8; IV Piggyback:1914.2] Out: 710 [Urine:710]  Intake/Output this shift: Total I/O In: 121.2 [I.V.:74.1; NG/GT:10; IV Piggyback:37.1] Out: 820 [Urine:800; Emesis/NG output:20]  Lab Results: Recent Labs    03/28/19 0524 03/28/19 1209 03/28/19 1847 03/29/19 0358  WBC 14.8*  --   --  14.3*  HGB 13.1 10.0* 9.0* 7.0*  HCT 42.2 33.0* 29.1* 22.4*  PLT 319  --   --  272   BMET Recent Labs    03/28/19 0524 03/29/19 0358  NA 133* 137  K 5.1 4.1  CL 95* 106  CO2  26 25  GLUCOSE 144* 112*  BUN 21 35*  CREATININE 0.95 0.88  CALCIUM 9.1 7.4*   LFT Recent Labs    03/28/19 0524  PROT 6.8  ALBUMIN 3.3*  AST 39  ALT 27  ALKPHOS 94  BILITOT 2.0*   PT/INR No results for input(s): LABPROT, INR in the last 72 hours. Hepatitis Panel No results for input(s): HEPBSAG, HCVAB, HEPAIGM, HEPBIGM in the last 72 hours.  Studies/Results: Ct Abdomen Pelvis W Contrast  Addendum Date: 03/28/2019   ADDENDUM REPORT: 03/28/2019 08:32 ADDENDUM: Study discussed by telephone with Dr. Wilson Singer in the ED on 03/28/2019 at 0822 hours. Electronically Signed   By: Genevie Ann M.D.   On: 03/28/2019 08:32   Result Date: 03/28/2019 CLINICAL DATA:  78 year old female with abdominal pain, nausea and vomiting since last night. EXAM: CT ABDOMEN AND PELVIS WITH CONTRAST TECHNIQUE: Multidetector CT imaging of the abdomen and pelvis was performed using the standard protocol following bolus administration of intravenous contrast. CONTRAST:  174mL ISOVUE-300 IOPAMIDOL (ISOVUE-300) INJECTION 61% COMPARISON:  CT Abdomen and Pelvis 01/31/2017. FINDINGS: Lower chest: Lung bases have not significantly changed with chronic lower lobe atelectasis or scarring. There is superimposed mosaic attenuation today, probably gas trapping. Stable mild cardiomegaly. No pericardial or pleural effusion. Hepatobiliary: 19 millimeter gallstones. No pericholecystic inflammation. Normal liver enhancement. No bile duct enlargement. Pancreas: Negative. Spleen: Negative. Adrenals/Urinary  Tract: Negative adrenal glands. Stable kidneys. Bilateral renal enhancement and contrast excretion is symmetric and normal. Small left renal parapelvic cysts. Unremarkable ureter. Stomach/Bowel: Negative rectum aside from retained stool. Small chronic calcified perirectal lymph node is unchanged. Mild diverticulosis in the sigmoid colon, no active inflammation. Negative descending colon. Redundant but otherwise negative transverse colon. Negative  right colon. Diminutive appendix redemonstrated. Negative terminal ileum. No dilated small bowel. There is confluent inflammatory stranding along the distal stomach with trace pneumoperitoneum in the adjacent mesentery on series 4, image 40. This is a similar appearance to that in 2018. The proximal duodenum is relatively spared. There is no discrete gastric lesion. No other free air. Small phlegmon like nodularity in the adjacent mesentery up to 2 centimeters. Retained fluid in the proximal stomach. No free fluid. Vascular/Lymphatic: Aortoiliac calcified atherosclerosis. Major arterial structures are patent. Portal venous system is patent. Reproductive: Surgically absent uterus. Diminutive or absent ovaries. Other: No pelvic free fluid. Musculoskeletal: Widespread thoracolumbar compression fractures and multilevel prior vertebral augmentation. Chronic posterior spinal rods. Osteopenia. Degenerative changes also at the hips and pubic symphysis. No acute osseous abnormality identified. IMPRESSION: 1. Inflammation and trace pneumoperitoneum along the distal stomach, similar to the abnormal CT in 2018, compatible with contained Perforated Peptic Ulcer. Today the duodenum appears relatively spared. No discrete gastric lesion. No free fluid. 2. No other acute findings. Chronic findings include: Lung base scarring, cholelithiasis, osteopenia with spinal compression fractures, aortic Atherosclerosis (ICD10-I70.0). Electronically Signed: By: Genevie Ann M.D. On: 03/28/2019 08:14    Scheduled Meds:  Chlorhexidine Gluconate Cloth  6 each Topical Daily   fluticasone furoate-vilanterol  1 puff Inhalation Daily   ondansetron (ZOFRAN) IV  4 mg Intravenous Once   [START ON 03/31/2019] pantoprazole  40 mg Intravenous Q12H   sodium chloride flush  3 mL Intravenous Q12H   umeclidinium bromide  1 puff Inhalation Daily   Continuous Infusions:  sodium chloride Stopped (03/28/19 2236)   pantoprozole (PROTONIX) infusion 8  mg/hr (03/29/19 1000)   piperacillin-tazobactam (ZOSYN)  IV 12.5 mL/hr at 03/29/19 1000   PRN Meds:.acetaminophen **OR** acetaminophen, albuterol, morphine injection, ondansetron **OR** ondansetron (ZOFRAN) IV, sodium chloride flush   ASSESMENT:   *   Recurrent perforated (contained) pyloric ulcer Hx GU perf and surgery 2018.   Day 2 Protonix drip.  Day 2 Zosyn  *    Hematemesis.  Rule out due to ulcer versus MWT  *     Normocytic anemia. Hgb 8 >> 13.1 >>  9 >> 7.   Transfusing 1 PRBC now.  Suspect the 13.1 was spurious.     *    UTI.  A 2 Zosyn   PLAN   *    Instill [employer]a down the NG tube, wait 10 to 15 minutes and then reattempt suction.  *    Portable KUB to assess position of NG tube and assess status of contained perforation.  Looking at her today, some concern that the perforation has opened up.   Azucena Freed  03/29/2019, 10:55 AM Phone [phone]    Attending Physician Note   I have taken an interval history, reviewed the chart and examined the patient. I agree with the Advanced Practitioner's note, impression and recommendations.   Gastric ulcer with contained perf and UGI bleed likely from ulcer or MWT Significant epigastric pain, tenderness and bleeding have not improved.   Flush NG and keep to LIS  Transfusions to keep Hb > 7 IV Zosyn IV pantoprazole infusion Surgery following  closely, if not improving or worsening operative mgmt indicated  Lucio Edward, MD [facility]

## 2019-03-29 NOTE — H&P (Signed)
NAME:  [name], MRN:  [id number], DOB:  Jul 09, 1941, LOS: 1 ADMISSION DATE:  03/28/2019, CONSULTATION DATE:   REFERRING MD:  Surgery ([place]), CHIEF COMPLAINT:  Hypotension, GI bleeding    Brief History   78yo female admitted 8/26 with hypotension in the setting of probable contained perforated peptic ulcer without peritonitis and hematemesis.    History of present illness   78yo female with hx perforated gastric ulcer 2018, HTN, CVA, asthma who presented 8/26 after 2 days of epigastric pain.  Onset was initially on 8/25. Primarily self treated w/ NPO status and rest. Pain really had no reliving factors but seemed to get worse when sitting down. As the morning  progressed on 8/26 she developed nausea and dry heaves and progressive malaise. Upon arrival to ER she had episode of hematemesis. She was seen in consultation by surgery and GI in ER.  CT scan revealed contained perforated peptic ulcer. Initial Hgb was 13.1 although likely hemoconcentrated after 36hrs nausea/vomiting. Some concern for mallory-weiss tear in setting nausea/vomiting/wretching.  She had progressive hypotension in ER and PCCM consulted for ICU admit.   Past Medical History   Past Medical History:  Diagnosis Date  . Anxiety   . Arthritis   . Asthma    hx of  . Back pain   . Chronic constipation   . Depression    hx of  . Dyspnea   . Family history of adverse reaction to anesthesia    DAD bp ELEVATED   . GERD (gastroesophageal reflux disease)   . High cholesterol   . Hypertension   . Lymphoma ([place])   . Obesity   . Sleep apnea    uses a CPAP  . Stroke ([place])    2010  . SVT (supraventricular tachycardia) ([place])      Significant Hospital Events     Consults:  GI  Surgery   Procedures:    Significant Diagnostic Tests:  CT abd/pelvis 8/26>>> 1. Inflammation and trace pneumoperitoneum along the distal stomach, similar to the abnormal CT in 2018, compatible with contained Perforated Peptic Ulcer.  Today the duodenum appears relatively spared. No discrete gastric lesion. No free fluid. 2. No other acute findings. Chronic findings include: Lung base scarring, cholelithiasis, osteopenia with spinal compression fractures, aortic Atherosclerosis (ICD10-I70.0).  Micro Data:    Antimicrobials:  Zosyn 8/26>>>  Interim history/subjective:  Patient is in pain and BP is marginal  Objective   Blood pressure (!) 147/41, pulse (!) 115, temperature 98.5 F (36.9 C), temperature source Oral, resp. rate (!) 21, height 5\' 1"  (1.549 m), weight 130.9 kg, SpO2 94 %.        Intake/Output Summary (Last 24 hours) at 03/29/2019 1013 Last data filed at 03/29/2019 1000 Gross per 24 hour  Intake 3282.28 ml  Output 1230 ml  Net 2052.28 ml   Filed Weights   03/28/19 0547 03/29/19 0354  Weight: 119.3 kg 130.9 kg    Examination: General: Acutely ill appearing female, painful distress HENT: [place]/AT, PERRL, EOM-I and MMM Lungs: Decreased BS at the bases  Cardiovascular: RRR, Nl S1/S2 and -M/R/G Abdomen: Soft, NT, ND and +BS Extremities: Chronic LE edema warm. Pulses palp  Neuro: Awake and alert  Skin: Thin but intact  I reviewed CXR myself, no acute disease noted.  Resolved Hospital Problem list     Assessment & Plan:   Circulatory shock. -suspect multifactorial being a mix of acute GI bleed (w/ concern for possible mallory-weiss tear from prolonged retching and vomiting)  and also sepsis from perforated contained gastric ulcer   -seen by surgery ->does have rebound tenderness but they did not think peritonitis.  -currently getting IVFs -already has received zosyn PLAN -  IVF resuscitation H&H Surgery following Hold antihypertensives from home:  lopressor, olmesartan, amlodipine, hctz D/C lasix D/C aspirin  Levophed as needed Hold home Day 2 zosyn Maintain in the ICU for monitoring  GI bleed/ hematemesis -- as above. GI suspects could be r/t mallory-weiss tear  PLAN -  GI  following EGD per GI Protonix gtt  NPO  PRN zofran   Hx COPD without acute exacerbation  PLAN -  Continue home BDs  (trelegy, albuterol PRN)  Pulmonary hygiene   Hx OSA  PLAN -  QHS CPAP   Hold in the ICU for monitoring given Hg drop and hypotension  Best practice:  Diet: NPO Pain/Anxiety/Delirium protocol (if indicated): n/a VAP protocol (if indicated): n/a DVT prophylaxis: SCD's  GI prophylaxis: protonix Glucose control: n/a Mobility: BR Code Status: full Family Communication: updated in ER  Disposition: ICU  Labs   CBC: Recent Labs  Lab 03/27/19 2319 03/28/19 0524 03/28/19 1209 03/28/19 1847 03/29/19 0358  WBC  --  14.8*  --   --  14.3*  HGB 8.0* 13.1 10.0* 9.0* 7.0*  HCT 25.6* 42.2 33.0* 29.1* 22.4*  MCV  --  92.7  --   --  93.3  PLT  --  319  --   --  [id number]    Basic Metabolic Panel: Recent Labs  Lab 03/28/19 0524 03/29/19 0358  NA 133* 137  K 5.1 4.1  CL 95* 106  CO2 26 25  GLUCOSE 144* 112*  BUN 21 35*  CREATININE 0.95 0.88  CALCIUM 9.1 7.4*  MG  --  1.8  PHOS  --  3.6   GFR: Estimated Creatinine Clearance: 66.3 mL/min (by C-G formula based on SCr of 0.88 mg/dL). Recent Labs  Lab 03/28/19 0524 03/28/19 1254 03/28/19 1258 03/28/19 1426 03/28/19 2059 03/29/19 0358  PROCALCITON  --  0.26  --   --   --   --   WBC 14.8*  --   --   --   --  14.3*  LATICACIDVEN  --   --  1.9 2.2* 2.0*  --     Liver Function Tests: Recent Labs  Lab 03/28/19 0524  AST 39  ALT 27  ALKPHOS 94  BILITOT 2.0*  PROT 6.8  ALBUMIN 3.3*   Recent Labs  Lab 03/28/19 0524  LIPASE 28   No results for input(s): AMMONIA in the last 168 hours.  ABG    Component Value Date/Time   PHART 7.332 (L) 02/01/2017 1529   PCO2ART 49.4 (H) 02/01/2017 1529   PO2ART 106.0 02/01/2017 1529   HCO3 26.4 02/01/2017 1529   TCO2 28 02/01/2017 1529   O2SAT 98.0 02/01/2017 1529     Coagulation Profile: No results for input(s): INR, PROTIME in the last 168 hours.   Cardiac Enzymes: No results for input(s): CKTOTAL, CKMB, CKMBINDEX, TROPONINI in the last 168 hours.  HbA1C: Hgb A1c MFr Bld  Date/Time Value Ref Range Status  07/19/2011 11:04 AM 6.2 (H) <5.7 % Final    Comment:    (NOTE)  According to the ADA Clinical Practice Recommendations for 2011, when HbA1c is used as a screening test:  >=6.5%   Diagnostic of Diabetes Mellitus           (if abnormal result is confirmed) 5.7-6.4%   Increased risk of developing Diabetes Mellitus References:Diagnosis and Classification of Diabetes Mellitus,Diabetes Care,2011,34(Suppl 1):S62-S69 and Standards of Medical Care in         Diabetes - 2011,Diabetes [id number] (Suppl 1):S11-S61.  06/05/2008 07:00 AM   Final   5.7 (NOTE)   The ADA recommends the following therapeutic goal for glycemic   control related to Hgb A1C measurement:   Goal of Therapy:   < 7.0% Hgb A1C   Reference: American Diabetes Association: Clinical Practice   Recommendations 2008, Diabetes Care,  2008, 31:(Suppl 1).   CBG: No results for input(s): GLUCAP in the last 168 hours.   The patient is critically ill with multiple organ systems failure and requires high complexity decision making for assessment and support, frequent evaluation and titration of therapies, application of advanced monitoring technologies and extensive interpretation of multiple databases.   Critical Care Time devoted to patient care services described in this note is  32  Minutes. This time reflects time of care of this signee Dr Jennet Maduro. This critical care time does not reflect procedure time, or teaching time or supervisory time of PA/NP/Med student/Med Resident etc but could involve care discussion time.  Rush Farmer, M.D. [facility] Pulmonary/Critical Care Medicine. Pager: [phone]. After hours pager: [phone].

## 2019-03-30 DIAGNOSIS — R578 Other shock: Secondary | ICD-10-CM

## 2019-03-30 LAB — CBC
HCT: 25.7 % — ABNORMAL LOW (ref 36.0–46.0)
Hemoglobin: 8.1 g/dL — ABNORMAL LOW (ref 12.0–15.0)
MCH: 28.9 pg (ref 26.0–34.0)
MCHC: 31.5 g/dL (ref 30.0–36.0)
MCV: 91.8 fL (ref 80.0–100.0)
Platelets: 275 10*3/uL (ref 150–400)
RBC: 2.8 MIL/uL — ABNORMAL LOW (ref 3.87–5.11)
RDW: 15.5 % (ref 11.5–15.5)
WBC: 13.1 10*3/uL — ABNORMAL HIGH (ref 4.0–10.5)
nRBC: 0 % (ref 0.0–0.2)

## 2019-03-30 LAB — BASIC METABOLIC PANEL
Anion gap: 7 (ref 5–15)
BUN: 21 mg/dL (ref 8–23)
CO2: 24 mmol/L (ref 22–32)
Calcium: 7.8 mg/dL — ABNORMAL LOW (ref 8.9–10.3)
Chloride: 107 mmol/L (ref 98–111)
Creatinine, Ser: 0.81 mg/dL (ref 0.44–1.00)
GFR calc Af Amer: >60 mL/min (ref >60)
GFR calc non Af Amer: >60 mL/min (ref >60)
Glucose, Bld: 100 mg/dL — ABNORMAL HIGH (ref 70–99)
Potassium: 4 mmol/L (ref 3.5–5.1)
Sodium: 138 mmol/L (ref 135–145)

## 2019-03-30 LAB — URINE CULTURE: Culture: >=100000 — AB

## 2019-03-30 LAB — MAGNESIUM: Magnesium: 1.7 mg/dL (ref 1.7–2.4)

## 2019-03-30 LAB — MRSA PCR SCREENING: MRSA by PCR: NEGATIVE

## 2019-03-30 LAB — PHOSPHORUS: Phosphorus: 3 mg/dL (ref 2.5–4.6)

## 2019-03-30 MED ORDER — MAGNESIUM SULFATE 2 GM/50ML IV SOLN
2.0000 g | Freq: Once | INTRAVENOUS | Status: AC
  Administered 2019-03-30: 2 g via INTRAVENOUS
  Filled 2019-03-30: qty 50

## 2019-03-30 NOTE — Progress Notes (Addendum)
Daily Rounding Note  03/30/2019, 1:16 PM  LOS: 2 days   SUBJECTIVE:   Chief complaint: Contained perforation of gastric ulcer. Feeling better.  Less pain in belly.  Signif decrease in bleeding per NGT.  Plan is to send to floor, out of ICU today.    OBJECTIVE:         Vital signs in last 24 hours:    Temp:  [97.6 F (36.4 C)-98.8 F (37.1 C)] 97.7 F (36.5 C) (08/28 1115) Pulse Rate:  [97-123] 98 (08/28 1200) Resp:  [15-23] 21 (08/28 1100) BP: (73-112)/(39-67) 112/56 (08/28 1200) SpO2:  [89 %-100 %] 97 % (08/28 1200) Arterial Line BP: (54-144)/(38-58) 132/47 (08/28 1100) Weight:  [132.5 kg] 132.5 kg (08/28 0406) Last BM Date: 03/29/19 Filed Weights   03/28/19 0547 03/29/19 0354 03/30/19 0406  Weight: 119.3 kg 130.9 kg 132.5 kg   General: looks better, resting comfortably   Heart: RRR Chest: clear bil.  No cough.  Slightly increased WOB Abdomen: soft, BS reduced.  Tender in Right abdomen and epigastrium. NGT in place, ~ 150 cc of burgundy blood in canister but fluid in tubing is pale yellow with a few bits of floating clots of dark matter (old blood?).    Extremities: no CCE Neuro/Psych:  Oriented x 3. More alert and engaged than yesterday.    Intake/Output from previous day: 08/27 0701 - 08/28 0700 In: 3077.5 [I.V.:2585.1; Blood:325; NG/GT:20; IV Piggyback:147.4] Out: [id number] [Urine:1750; Emesis/NG output:40]  Intake/Output this shift: Total I/O In: 275 [I.V.:250; IV Piggyback:25] Out: -   Lab Results: Recent Labs    03/29/19 0358 03/29/19 1645 03/30/19 0611  WBC 14.3* 16.0* 13.1*  HGB 7.0* 8.4* 8.1*  HCT 22.4* 26.7* 25.7*  PLT 272 278 275   BMET Recent Labs    03/28/19 0524 03/29/19 0358 03/30/19 0611  NA 133* 137 138  K 5.1 4.1 4.0  CL 95* 106 107  CO2 26 25 24   GLUCOSE 144* 112* 100*  BUN 21 35* 21  CREATININE 0.95 0.88 0.81  CALCIUM 9.1 7.4* 7.8*   LFT Recent Labs    03/28/19 0524   PROT 6.8  ALBUMIN 3.3*  AST 39  ALT 27  ALKPHOS 94  BILITOT 2.0*    Studies/Results: Dg Abd 1 View  Result Date: 03/29/2019 CLINICAL DATA:  Perforation of stomach after ingestion of foreign material. NG tube placement. EXAM: ABDOMEN - 1 VIEW COMPARISON:  CT 03/28/2019. FINDINGS: NG tube noted coiled in the left upper quadrant most likely in the fundus of the stomach. No gastric distention. No bowel distention. No free air identified. Prior thoracolumbar spine fusion. Prior thoracic and lumbar vertebroplasties. IMPRESSION: NG tube noted coiled of the fundus of the stomach. No gastric distention. Electronically Signed   By: [place]   On: 03/29/2019 11:46    Scheduled Meds: . Chlorhexidine Gluconate Cloth  6 each Topical Daily  . fluticasone furoate-vilanterol  1 puff Inhalation Daily  . ondansetron (ZOFRAN) IV  4 mg Intravenous Once  . [START ON 03/31/2019] pantoprazole  40 mg Intravenous Q12H  . sodium chloride flush  3 mL Intravenous Q12H  . umeclidinium bromide  1 puff Inhalation Daily   Continuous Infusions: . sodium chloride 100 mL/hr at 03/30/19 0900  . pantoprozole (PROTONIX) infusion 8 mg/hr (03/30/19 0900)  . piperacillin-tazobactam (ZOSYN)  IV 12.5 mL/hr at 03/30/19 0900   PRN Meds:.acetaminophen **OR** acetaminophen, albuterol, morphine injection, ondansetron **OR** ondansetron (ZOFRAN) IV, sodium chloride  flush  ASSESMENT:   *   Recurrent perforated (contained) pyloric ulcer Hx GU perf and surgery 2018.   Day 3 Protonix drip.  Day 3 Zosyn No evidence for extension of perforation on yesterday's KUB.  *    Hematemesis, blood per NGT.  Rule out due to ulcer versus MWT  *    Normocytic anemia. Hgb 7 >> 1 PRBC >> 8.1.        *    UTI.  Day 3 Zosyn    PLAN   *   72 hours Protonix finishes @ 10 AM tmrw.  Go to 40 mg IV BID after that.    *    Agree with surgery with clamping NGT periodically.  Defer removal of NGT to surgery.      *   Surgery  planning  UGI series 8/30 vs 8/31.     Azucena Freed  03/30/2019, 1:16 PM Phone [phone]    Attending Physician Note   I have taken an interval history, reviewed the chart and examined the patient. I agree with the Advanced Practitioner's note, impression and recommendations. Abdominal pain and tenderness improved. Significant decrease in blood per NGT. Continue current mgmt. GI will re-evaluate on Monday and available over weekend for problems, questions. Surgery following closely and plans UGI series in next couple days.   Lucio Edward, MD [facility]

## 2019-03-30 NOTE — Progress Notes (Signed)
Patient unable to wear CPAP due to NG tube in place. RT will continue to monitor.

## 2019-03-30 NOTE — Progress Notes (Signed)
[facility] Progress Note     Subjective: CC: UGI bleed Patient with some abdominal pain, now only in epigastrium and less severe than previously. Denies nausea. Passing flatus.  Objective: Vital signs in last 24 hours: Temp:  [97.6 F (36.4 C)-99.5 F (37.5 C)] 98.1 F (36.7 C) (08/28 0340) Pulse Rate:  [97-126] 103 (08/28 0850) Resp:  [16-24] 18 (08/28 0850) BP: (88-147)/(40-67) 90/42 (08/28 0800) SpO2:  [89 %-100 %] 99 % (08/28 0850) Arterial Line BP: (56-152)/(36-58) 105/52 (08/28 0850) Weight:  [132.5 kg] 132.5 kg (08/28 0406) Last BM Date: 03/29/19  Intake/Output from previous day: 08/27 0701 - 08/28 0700 In: 3077.5 [I.V.:2585.1; Blood:325; NG/GT:20; IV Piggyback:147.4] Out: [id number] [Urine:1750; Emesis/NG output:40] Intake/Output this shift: Total I/O In: 137.5 [I.V.:125; IV Piggyback:12.4] Out: -   PE: Gen:  Resting but easily awakened, pleasant Card:  Sinus tachycardia in low 100s Pulm:  Normal effort, clear to auscultation bilaterally Abd: Soft, TTP in epigastrium only today, no guarding, no peritonitis, non-distended, +BS Skin: warm and dry, no rashes  Psych: A&Ox3   Lab Results:  Recent Labs    03/29/19 1645 03/30/19 0611  WBC 16.0* 13.1*  HGB 8.4* 8.1*  HCT 26.7* 25.7*  PLT 278 275   BMET Recent Labs    03/29/19 0358 03/30/19 0611  NA 137 138  K 4.1 4.0  CL 106 107  CO2 25 24  GLUCOSE 112* 100*  BUN 35* 21  CREATININE 0.88 0.81  CALCIUM 7.4* 7.8*   PT/INR No results for input(s): LABPROT, INR in the last 72 hours. CMP     Component Value Date/Time   NA 138 03/30/2019 0611   K 4.0 03/30/2019 0611   CL 107 03/30/2019 0611   CO2 24 03/30/2019 0611   GLUCOSE 100 (H) 03/30/2019 0611   BUN 21 03/30/2019 0611   CREATININE 0.81 03/30/2019 0611   CALCIUM 7.8 (L) 03/30/2019 0611   PROT 6.8 03/28/2019 0524   ALBUMIN 3.3 (L) 03/28/2019 0524   AST 39 03/28/2019 0524   ALT 27 03/28/2019 0524   ALKPHOS 94 03/28/2019 0524   BILITOT 2.0 (H) 03/28/2019 0524   GFRNONAA >60 03/30/2019 0611   GFRAA >60 03/30/2019 0611   Lipase     Component Value Date/Time   LIPASE 28 03/28/2019 0524       Studies/Results: Dg Abd 1 View  Result Date: 03/29/2019 CLINICAL DATA:  Perforation of stomach after ingestion of foreign material. NG tube placement. EXAM: ABDOMEN - 1 VIEW COMPARISON:  CT 03/28/2019. FINDINGS: NG tube noted coiled in the left upper quadrant most likely in the fundus of the stomach. No gastric distention. No bowel distention. No free air identified. Prior thoracolumbar spine fusion. Prior thoracic and lumbar vertebroplasties. IMPRESSION: NG tube noted coiled of the fundus of the stomach. No gastric distention. Electronically Signed   By: Marcello Moores  Register   On: 03/29/2019 11:46    Anti-infectives: Anti-infectives (From admission, onward)   Start     Dose/Rate Route Frequency Ordered Stop   03/28/19 1500  piperacillin-tazobactam (ZOSYN) IVPB 3.375 g     3.375 g 12.5 mL/hr over 240 Minutes Intravenous Every 8 hours 03/28/19 1004     03/28/19 1400  piperacillin-tazobactam (ZOSYN) IVPB 3.375 g  Status:  Discontinued     3.375 g 100 mL/hr over 30 Minutes Intravenous Every 8 hours 03/28/19 1001 03/28/19 1003   03/28/19 0845  piperacillin-tazobactam (ZOSYN) IVPB 3.375 g     3.375 g 100 mL/hr over 30 Minutes Intravenous  Once 03/28/19 0840 03/28/19 1002       Assessment/Plan Asthma H/O CVA H/O lymphoma HTN H/O contained gastric perforation in 2018  Contained gastric ulcer perforation with hematemesis  - patient appears to have a contained gastric ulcer perforation at the same site as previously noted in 2018.  - hgb 8.1, s/p 1 unit PRBC 8/27 - continue PPI gtt - patient TTP over epigastrium but no peritonitis, no indication for emergent surgical intervention - ok to clamp NGT some for now, but still strict NPO - plan UGI either Sunday or Monday if continued to improve  FEN  -NPO/NGT/IVFs/protonix gtt VTE -would hold given hematemesis ID -zosyn 8/26 >>  LOS: 2 days    Brigid Re , [facility] 03/30/2019, 8:58 AM Pager: [phone] Consults: [phone] 7:00 AM - 4:30 PM M, W-F 7:00 AM - 11:30 AM Tues, Sat, Sun

## 2019-03-30 NOTE — Progress Notes (Addendum)
NAME:  [name], MRN:  [id number], DOB:  1941-04-02, LOS: 2 ADMISSION DATE:  03/28/2019, CONSULTATION DATE:   REFERRING MD:  Surgery ([place]), CHIEF COMPLAINT:  Hypotension, GI bleeding    Brief History   78yo female admitted 8/26 with hypotension in the setting of probable contained perforated peptic ulcer without peritonitis and hematemesis.    History of present illness   78yo female with hx perforated gastric ulcer 2018, HTN, CVA, asthma who presented 8/26 after 2 days of epigastric pain.  Onset was initially on 8/25. Primarily self treated w/ NPO status and rest. Pain really had no reliving factors but seemed to get worse when sitting down. As the morning  progressed on 8/26 she developed nausea and dry heaves and progressive malaise. Upon arrival to ER she had episode of hematemesis. She was seen in consultation by surgery and GI in ER.  CT scan revealed contained perforated peptic ulcer. Initial Hgb was 13.1 although likely hemoconcentrated after 36hrs nausea/vomiting. Some concern for mallory-weiss tear in setting nausea/vomiting/wretching.  She had progressive hypotension in ER and PCCM consulted for ICU admit.   Past Medical History   Past Medical History:  Diagnosis Date  . Anxiety   . Arthritis   . Asthma    hx of  . Back pain   . Chronic constipation   . Depression    hx of  . Dyspnea   . Family history of adverse reaction to anesthesia    DAD bp ELEVATED   . GERD (gastroesophageal reflux disease)   . High cholesterol   . Hypertension   . Lymphoma ([place])   . Obesity   . Sleep apnea    uses a CPAP  . Stroke ([place])    2010  . SVT (supraventricular tachycardia) ([place])      Significant Hospital Events     Consults:  GI  Surgery   Procedures:    Significant Diagnostic Tests:  CT abd/pelvis 8/26>>> 1. Inflammation and trace pneumoperitoneum along the distal stomach, similar to the abnormal CT in 2018, compatible with contained Perforated Peptic Ulcer.  Today the duodenum appears relatively spared. No discrete gastric lesion. No free fluid. 2. No other acute findings. Chronic findings include: Lung base scarring, cholelithiasis, osteopenia with spinal compression fractures, aortic Atherosclerosis (ICD10-I70.0).  Micro Data:  03/28/2019>>Blood 03/28/2019 >> Urine>> E Coli 03/28/2019>> SARS>> Negative  Antimicrobials:  Zosyn 8/26>>>  Interim history/subjective:  Patient is in pain and BP remains soft but MAP > 65 + 4600 cc's Adequate UO A fib per tele  Objective   Blood pressure (!) 107/47, pulse 100, temperature 98.1 F (36.7 C), temperature source Oral, resp. rate 15, height 5\' 1"  (1.549 m), weight 132.5 kg, SpO2 95 %.        Intake/Output Summary (Last 24 hours) at 03/30/2019 1004 Last data filed at 03/30/2019 0900 Gross per 24 hour  Intake 2931.31 ml  Output 970 ml  Net 1961.31 ml   Filed Weights   03/28/19 0547 03/29/19 0354 03/30/19 0406  Weight: 119.3 kg 130.9 kg 132.5 kg    Examination: General: Acutely ill appearing female, resting after pain medication given, awake and alert HENT: [place]/AT, PERRL, EOM-I and MM dry, NG tube Lungs: Bilateral chest excursion, Clear throughout , diminished per bases, on 2 L [place]   Cardiovascular: S1, S2, RRR,  -M/R/G Abdomen: Soft, Slight epigastric tenderness, ND and +BS Extremities: Chronic LE edema warm. Pulses palp, extremities warm to touch  Neuro: Awake and alert , MAE x  4, Following commands Skin: Warm and dry, intact, no rash or lesions    Resolved Hospital Problem list     Assessment & Plan:   Circulatory shock. -suspect multifactorial being a mix of acute GI bleed (w/ concern for possible mallory-weiss tear from prolonged retching and vomiting) and also sepsis from perforated contained gastric ulcer  HGB 8.1 on 8/28  -seen by surgery ->does have rebound tenderness but they did not think peritonitis.  -currently getting IVFs -already has received zosyn PLAN -   Continue IV Fluids Trend CBC Surgery following Hold antihypertensives from home:  lopressor, olmesartan, amlodipine, hctz D/C lasix D/C aspirin  Levophed as needed to maintain MAP > 65 Day 3 zosyn Consider moving to Progressive Unit as she is off pressors  GI bleed/ hematemesis -- as above. GI suspects could be r/t mallory-weiss tear  PLAN -  GI following For UGI Sunday or Monday Continue Protonix gtt  NPO >> per surgery can clamp NG for short intervals PRN zofran  Transfuse for HGB < 7  A fib Mag 1.7 Plan Tele monitoring Consider Rate control ( Low dose lopressor) Replete Mag 8/28 Trend Mag   Hx COPD without acute exacerbation  PLAN -  Continue home BDs  (trelegy, albuterol PRN)  Pulmonary hygiene   Hx OSA  PLAN -  QHS CPAP    HGB 8.1 Consider transfer to Progressive Care  unit  Best practice:  Diet: NPO Pain/Anxiety/Delirium protocol (if indicated): n/a VAP protocol (if indicated): n/a DVT prophylaxis: SCD's  GI prophylaxis: protonix Glucose control: n/a Mobility: BR Code Status: full Family Communication: updated in ER  Disposition: ICU  Labs   CBC: Recent Labs  Lab 03/28/19 0524 03/28/19 1209 03/28/19 1847 03/29/19 0358 03/29/19 1645 03/30/19 0611  WBC 14.8*  --   --  14.3* 16.0* 13.1*  HGB 13.1 10.0* 9.0* 7.0* 8.4* 8.1*  HCT 42.2 33.0* 29.1* 22.4* 26.7* 25.7*  MCV 92.7  --   --  93.3 93.4 91.8  PLT 319  --   --  272 278 [id number]    Basic Metabolic Panel: Recent Labs  Lab 03/28/19 0524 03/29/19 0358 03/30/19 0611  NA 133* 137 138  K 5.1 4.1 4.0  CL 95* 106 107  CO2 26 25 24   GLUCOSE 144* 112* 100*  BUN 21 35* 21  CREATININE 0.95 0.88 0.81  CALCIUM 9.1 7.4* 7.8*  MG  --  1.8 1.7  PHOS  --  3.6 3.0   GFR: Estimated Creatinine Clearance: 72.6 mL/min (by C-G formula based on SCr of 0.81 mg/dL). Recent Labs  Lab 03/28/19 0524 03/28/19 1254 03/28/19 1258 03/28/19 1426 03/28/19 2059 03/29/19 0358 03/29/19 1645 03/30/19 0611   PROCALCITON  --  0.26  --   --   --   --   --   --   WBC 14.8*  --   --   --   --  14.3* 16.0* 13.1*  LATICACIDVEN  --   --  1.9 2.2* 2.0*  --   --   --     Liver Function Tests: Recent Labs  Lab 03/28/19 0524  AST 39  ALT 27  ALKPHOS 94  BILITOT 2.0*  PROT 6.8  ALBUMIN 3.3*   Recent Labs  Lab 03/28/19 0524  LIPASE 28   No results for input(s): AMMONIA in the last 168 hours.  ABG    Component Value Date/Time   PHART 7.332 (L) 02/01/2017 1529   PCO2ART 49.4 (H) 02/01/2017 1529  PO2ART 106.0 02/01/2017 1529   HCO3 26.4 02/01/2017 1529   TCO2 28 02/01/2017 1529   O2SAT 98.0 02/01/2017 1529     Coagulation Profile: No results for input(s): INR, PROTIME in the last 168 hours.  Cardiac Enzymes: No results for input(s): CKTOTAL, CKMB, CKMBINDEX, TROPONINI in the last 168 hours.  HbA1C: Hgb A1c MFr Bld  Date/Time Value Ref Range Status  07/19/2011 11:04 AM 6.2 (H) <5.7 % Final    Comment:    (NOTE)                                                                       According to the ADA Clinical Practice Recommendations for 2011, when HbA1c is used as a screening test:  >=6.5%   Diagnostic of Diabetes Mellitus           (if abnormal result is confirmed) 5.7-6.4%   Increased risk of developing Diabetes Mellitus References:Diagnosis and Classification of Diabetes Mellitus,Diabetes Care,2011,34(Suppl 1):S62-S69 and Standards of Medical Care in         Diabetes - 2011,Diabetes [id number] (Suppl 1):S11-S61.  06/05/2008 07:00 AM   Final   5.7 (NOTE)   The ADA recommends the following therapeutic goal for glycemic   control related to Hgb A1C measurement:   Goal of Therapy:   < 7.0% Hgb A1C   Reference: American Diabetes Association: Clinical Practice   Recommendations 2008, Diabetes Care,  2008, 31:(Suppl 1).   CBG: No results for input(s): GLUCAP in the last 168 hours.   Magdalen Spatz, AGACNP-BC [place]. Pager: [phone]  After hours pager: [phone]. 03/30/2019 10:05 AM  Attending Note:  78 year old female with UGI bleed due to a gastric ulcer that was in shock and now is stabilizing with minimal pressor support.  No events overnight.  On exam, she is alert and interactive, moving all ext to commands with clear lungs and very dry mucous membranes.  I reviewed CXR myself, no acute disease noted.  Discussed with PCCM-NP.  Will attempt to get off pressors today, if stable off then will consider transfer out of the ICU and to progressive and TRH with PCCM off on 8/29.  Will recheck in the afternoon.  Surgery and GI following, plan on EGD on Sunday or Monday and change NGT to low intermittent suction.  Ice chips only to rinse her mouth.  Patient and daughter updated bedside.  The patient is critically ill with multiple organ systems failure and requires high complexity decision making for assessment and support, frequent evaluation and titration of therapies, application of advanced monitoring technologies and extensive interpretation of multiple databases.   Critical Care Time devoted to patient care services described in this note is  31  Minutes. This time reflects time of care of this signee Dr Jennet Maduro. This critical care time does not reflect procedure time, or teaching time or supervisory time of PA/NP/Med student/Med Resident etc but could involve care discussion time.  Rush Farmer, M.D. [facility] Pulmonary/Critical Care Medicine. Pager: [phone]. After hours pager: [phone].

## 2019-03-31 ENCOUNTER — Inpatient Hospital Stay (HOSPITAL_COMMUNITY): Payer: Medicare Other

## 2019-03-31 LAB — COMPREHENSIVE METABOLIC PANEL
ALT: 12 U/L (ref 0–44)
AST: 10 U/L — ABNORMAL LOW (ref 15–41)
Albumin: 1.9 g/dL — ABNORMAL LOW (ref 3.5–5.0)
Alkaline Phosphatase: 48 U/L (ref 38–126)
Anion gap: 9 (ref 5–15)
BUN: 17 mg/dL (ref 8–23)
CO2: 23 mmol/L (ref 22–32)
Calcium: 7.9 mg/dL — ABNORMAL LOW (ref 8.9–10.3)
Chloride: 106 mmol/L (ref 98–111)
Creatinine, Ser: 0.77 mg/dL (ref 0.44–1.00)
GFR calc Af Amer: >60 mL/min (ref >60)
GFR calc non Af Amer: >60 mL/min (ref >60)
Glucose, Bld: 95 mg/dL (ref 70–99)
Potassium: 3.9 mmol/L (ref 3.5–5.1)
Sodium: 138 mmol/L (ref 135–145)
Total Bilirubin: 1.1 mg/dL (ref 0.3–1.2)
Total Protein: 5.1 g/dL — ABNORMAL LOW (ref 6.5–8.1)

## 2019-03-31 LAB — CBC
HCT: 24.4 % — ABNORMAL LOW (ref 36.0–46.0)
Hemoglobin: 7.4 g/dL — ABNORMAL LOW (ref 12.0–15.0)
MCH: 29.1 pg (ref 26.0–34.0)
MCHC: 30.3 g/dL (ref 30.0–36.0)
MCV: 96.1 fL (ref 80.0–100.0)
Platelets: 322 10*3/uL (ref 150–400)
RBC: 2.54 MIL/uL — ABNORMAL LOW (ref 3.87–5.11)
RDW: 15.4 % (ref 11.5–15.5)
WBC: 9.8 10*3/uL (ref 4.0–10.5)
nRBC: 0 % (ref 0.0–0.2)

## 2019-03-31 LAB — GLUCOSE, CAPILLARY
Glucose-Capillary: 85 mg/dL (ref 70–99)
Glucose-Capillary: 88 mg/dL (ref 70–99)
Glucose-Capillary: 84 mg/dL (ref 70–99)

## 2019-03-31 LAB — TYPE AND SCREEN
ABO/RH(D): A POS
Antibody Screen: NEGATIVE

## 2019-03-31 LAB — HEMOGLOBIN AND HEMATOCRIT, BLOOD
HCT: 26.8 % — ABNORMAL LOW (ref 36.0–46.0)
Hemoglobin: 8.2 g/dL — ABNORMAL LOW (ref 12.0–15.0)

## 2019-03-31 LAB — MAGNESIUM: Magnesium: 1.9 mg/dL (ref 1.7–2.4)

## 2019-03-31 LAB — PREPARE RBC (CROSSMATCH)

## 2019-03-31 MED ORDER — SODIUM CHLORIDE 0.9% IV SOLUTION
Freq: Once | INTRAVENOUS | Status: AC
  Administered 2019-03-31: 13:00:00 via INTRAVENOUS

## 2019-03-31 NOTE — Progress Notes (Signed)
PROGRESS NOTE    [name]  [id number] DOB: 1941/05/29 DOA: 03/28/2019 PCP: Leanna Battles, MD   Brief Narrative: 78 year old with past medical history significant for hypertension, CVA perforated gastric ulcer 2018 who presented on 8/20 2:06 days of epigastric pain.  Subsequently patient developed nausea and dry heavies and progressive malaise.  In the ED is she had an episode of hematemesis.  CT scan revealed a contained perforated peptic ulcer.  Patient had progressive hypotension in the ER and she was admitted under PCCM service.  Patient was also evaluated by general surgery and GI.  Patient admitted with hypovolemic shock in the setting of GI bleed and sepsis from perforated gastric ulcer.  She was a started on IV fluids, IV Protonix drip and levo fed.    Assessment & Plan:   Principal Problem:   Perforated gastric ulcer ([place]) Active Problems:   Hypotension   Hematemesis   Acute lower UTI   Leukocytosis   Hyponatremia   Perforated peptic ulcer (HCC)   1-Hypovolemic and septic shock; related to hematemesis and perforated gastric ulcer.  Patient was a started on IV fluids levophed.  Continue with IV Zosyn. Blood transfusion as needed. Hb has decreased to 7, will proceed with another unit PRBC  BP stable.   2-Contain perforated gastric ulcer with hematemesis: Upper GI bleed.  She was started on protonix Gtt-- transition to 40 Mg IV BID tonight  NG tube in place.  Hb decreased to 7, will transfuse one unit 8/29. S/P one unit PRBC on 8/27. General surgery following, recommending UGI tomorrow.   3-HTN; continue to hold lopressor, olmersarta, Norvasc and HCTZ.   4-A fib;  Rate controlled currently.  Could use low dose Metoprolol if hr increases.   5-H/O COPD; Continue with home BD   Hx OSA;  CPAP   Estimated body mass index is 56.19 kg/m as calculated from the following:   Height as of this encounter: 5\' 1"  (1.549 m).   Weight as of this encounter: 134.9  kg.   DVT prophylaxis: SCD Code Status: Full Code Family Communication: no family at bedside Disposition Plan: remain in the hospital for treatment of perforated Gastric ulcer, not taking oral.  Consultants:   General sx  GI    Procedures:   None  Antimicrobials:  Zosyn   Subjective: She report improvement of abdominal pain.  NG tube is clamp  Objective: Vitals:   03/31/19 0342 03/31/19 0400 03/31/19 0500 03/31/19 0600  BP:  (!) 108/45 (!) 105/55 (!) 111/55  Pulse:  (!) 113 (!) 110 (!) 105  Resp:      Temp:   98.4 F (36.9 C)   TempSrc:   Oral   SpO2:  97% 96% 97%  Weight: 134.9 kg     Height:        Intake/Output Summary (Last 24 hours) at 03/31/2019 0710 Last data filed at 03/31/2019 0600 Gross per 24 hour  Intake 2998.56 ml  Output 1600 ml  Net 1398.56 ml   Filed Weights   03/29/19 0354 03/30/19 0406 03/31/19 0342  Weight: 130.9 kg 132.5 kg 134.9 kg    Examination:  General exam: Appears calm and comfortable , NG tube Respiratory system: Clear to auscultation. Respiratory effort normal. Cardiovascular system: S1 & S2 heard, RRR. No JVD, murmurs, rubs, gallops or clicks. No pedal edema. Gastrointestinal system: Abdomen is nondistended, soft and mild tender. No organomegaly or masses felt. Normal bowel sounds heard. Central nervous system: Alert and oriented. No focal neurological  deficits. Extremities: Symmetric 5 x 5 power. Skin: No rashes, lesions or ulcers   Data Reviewed: I have personally reviewed following labs and imaging studies  CBC: Recent Labs  Lab 03/28/19 0524  03/28/19 1847 03/29/19 0358 03/29/19 1645 03/30/19 0611 03/31/19 0302  WBC 14.8*  --   --  14.3* 16.0* 13.1* 9.8  HGB 13.1   < > 9.0* 7.0* 8.4* 8.1* 7.4*  HCT 42.2   < > 29.1* 22.4* 26.7* 25.7* 24.4*  MCV 92.7  --   --  93.3 93.4 91.8 96.1  PLT 319  --   --  272 278 275 322   < > = values in this interval not displayed.   Basic Metabolic Panel: Recent Labs  Lab  03/28/19 0524 03/29/19 0358 03/30/19 0611 03/31/19 0302  NA 133* 137 138 138  K 5.1 4.1 4.0 3.9  CL 95* 106 107 106  CO2 26 25 24 23   GLUCOSE 144* 112* 100* 95  BUN 21 35* 21 17  CREATININE 0.95 0.88 0.81 0.77  CALCIUM 9.1 7.4* 7.8* 7.9*  MG  --  1.8 1.7 1.9  PHOS  --  3.6 3.0  --    GFR: Estimated Creatinine Clearance: 74.4 mL/min (by C-G formula based on SCr of 0.77 mg/dL). Liver Function Tests: Recent Labs  Lab 03/28/19 0524 03/31/19 0302  AST 39 10*  ALT 27 12  ALKPHOS 94 48  BILITOT 2.0* 1.1  PROT 6.8 5.1*  ALBUMIN 3.3* 1.9*   Recent Labs  Lab 03/28/19 0524  LIPASE 28   No results for input(s): AMMONIA in the last 168 hours. Coagulation Profile: No results for input(s): INR, PROTIME in the last 168 hours. Cardiac Enzymes: No results for input(s): CKTOTAL, CKMB, CKMBINDEX, TROPONINI in the last 168 hours. BNP (last 3 results) No results for input(s): PROBNP in the last 8760 hours. HbA1C: No results for input(s): HGBA1C in the last 72 hours. CBG: No results for input(s): GLUCAP in the last 168 hours. Lipid Profile: No results for input(s): CHOL, HDL, LDLCALC, TRIG, CHOLHDL, LDLDIRECT in the last 72 hours. Thyroid Function Tests: No results for input(s): TSH, T4TOTAL, FREET4, T3FREE, THYROIDAB in the last 72 hours. Anemia Panel: No results for input(s): VITAMINB12, FOLATE, FERRITIN, TIBC, IRON, RETICCTPCT in the last 72 hours. Sepsis Labs: Recent Labs  Lab 03/28/19 1254 03/28/19 1258 03/28/19 1426 03/28/19 2059  PROCALCITON 0.26  --   --   --   LATICACIDVEN  --  1.9 2.2* 2.0*    Recent Results (from the past 240 hour(s))  SARS CORONAVIRUS 2 (TAT 6-12 HRS) Nasal Swab Aptima Multi Swab     Status: None   Collection Time: 03/28/19  8:41 AM   Specimen: Aptima Multi Swab; Nasal Swab  Result Value Ref Range Status   SARS Coronavirus 2 NEGATIVE NEGATIVE Final    Comment: (NOTE) SARS-CoV-2 target nucleic acids are NOT DETECTED. The SARS-CoV-2 RNA is  generally detectable in upper and lower respiratory specimens during the acute phase of infection. Negative results do not preclude SARS-CoV-2 infection, do not rule out co-infections with other pathogens, and should not be used as the sole basis for treatment or other patient management decisions. Negative results must be combined with clinical observations, patient history, and epidemiological information. The expected result is Negative. Fact Sheet for Patients: SugarRoll.be Fact Sheet for Healthcare Providers: https://www.woods-mathews.com/ This test is not yet approved or cleared by the [place] FDA and  has been authorized for detection and/or diagnosis of SARS-CoV-2  by FDA under an Emergency Use Authorization (EUA). This EUA will remain  in effect (meaning this test can be used) for the duration of the COVID-19 declaration under Section 56 4(b)(1) of the Act, 21 U.S.C. section 360bbb-3(b)(1), unless the authorization is terminated or revoked sooner. Performed at [facility], [place] [address], [place], [place] [address]   Urine culture     Status: Abnormal   Collection Time: 03/28/19  8:58 AM   Specimen: Urine, Random  Result Value Ref Range Status   Specimen Description URINE, RANDOM  Final   Special Requests   Final    NONE Performed at [facility], [place] [address], [place], [place] [address]    Culture >=100,000 COLONIES/mL ESCHERICHIA COLI (A)  Final   Report Status 03/30/2019 FINAL  Final   Organism ID, Bacteria ESCHERICHIA COLI (A)  Final      Susceptibility   Escherichia coli - MIC*    AMPICILLIN <=2 SENSITIVE Sensitive     CEFAZOLIN <=4 SENSITIVE Sensitive     CEFTRIAXONE <=1 SENSITIVE Sensitive     CIPROFLOXACIN <=0.25 SENSITIVE Sensitive     GENTAMICIN <=1 SENSITIVE Sensitive     IMIPENEM <=0.25 SENSITIVE Sensitive     NITROFURANTOIN 32 SENSITIVE Sensitive     TRIMETH/SULFA <=20 SENSITIVE Sensitive      AMPICILLIN/SULBACTAM <=2 SENSITIVE Sensitive     PIP/TAZO <=4 SENSITIVE Sensitive     Extended ESBL NEGATIVE Sensitive     * >=100,000 COLONIES/mL ESCHERICHIA COLI  Culture, blood (routine x 2)     Status: None (Preliminary result)   Collection Time: 03/28/19  2:40 PM   Specimen: Vein; Blood  Result Value Ref Range Status   Specimen Description BLOOD RIGHT ANTECUBITAL  Final   Special Requests   Final    BOTTLES DRAWN AEROBIC ONLY Blood Culture adequate volume   Culture   Final    NO GROWTH 2 DAYS Performed at [facility], [place] [address] [place], [place] [address]    Report Status PENDING  Incomplete  Culture, blood (routine x 2)     Status: None (Preliminary result)   Collection Time: 03/28/19  2:46 PM   Specimen: Vein; Blood  Result Value Ref Range Status   Specimen Description BLOOD RIGHT HAND  Final   Special Requests   Final    BOTTLES DRAWN AEROBIC ONLY Blood Culture adequate volume   Culture   Final    NO GROWTH 2 DAYS Performed at [facility], [place] [address] [place], [place] [address]    Report Status PENDING  Incomplete  MRSA PCR Screening     Status: None   Collection Time: 03/30/19  9:37 AM   Specimen: Nasopharyngeal  Result Value Ref Range Status   MRSA by PCR NEGATIVE NEGATIVE Final    Comment:        The GeneXpert MRSA Assay (FDA approved for NASAL specimens only), is one component of a comprehensive MRSA colonization surveillance program. It is not intended to diagnose MRSA infection nor to guide or monitor treatment for MRSA infections. Performed at [facility], [place] [address] [place], [place] [address]          Radiology Studies: Dg Abd 1 View  Result Date: 03/29/2019 CLINICAL DATA:  Perforation of stomach after ingestion of foreign material. NG tube placement. EXAM: ABDOMEN - 1 VIEW COMPARISON:  CT 03/28/2019. FINDINGS: NG tube noted coiled in the left upper quadrant most likely in the  fundus of the stomach. No gastric  distention. No bowel distention. No free air identified. Prior thoracolumbar spine fusion. Prior thoracic and lumbar vertebroplasties. IMPRESSION: NG tube noted coiled of the fundus of the stomach. No gastric distention. Electronically Signed   By: Marcello Moores  Register   On: 03/29/2019 11:46   Dg Chest Port 1 View  Result Date: 03/31/2019 CLINICAL DATA:  Respiratory failure EXAM: PORTABLE CHEST 1 VIEW COMPARISON:  02/08/2017 FINDINGS: Nasogastric tube terminates at the body of the stomach. Thoracic spine fixation. Midline trachea. Normal heart size. Small left pleural effusion, similar. No pneumothorax. Low lung volumes. Left base airspace disease. IMPRESSION: Small left pleural effusion with left base atelectasis versus infection. Electronically Signed   By: Abigail Miyamoto M.D.   On: 03/31/2019 06:47        Scheduled Meds: . Chlorhexidine Gluconate Cloth  6 each Topical Daily  . fluticasone furoate-vilanterol  1 puff Inhalation Daily  . ondansetron (ZOFRAN) IV  4 mg Intravenous Once  . pantoprazole  40 mg Intravenous Q12H  . sodium chloride flush  3 mL Intravenous Q12H  . umeclidinium bromide  1 puff Inhalation Daily   Continuous Infusions: . sodium chloride 100 mL/hr at 03/31/19 0600  . pantoprozole (PROTONIX) infusion 8 mg/hr (03/31/19 0600)  . piperacillin-tazobactam (ZOSYN)  IV 3.375 g (03/31/19 0626)     LOS: 3 days    Time spent: 35 minutes.     Elmarie Shiley, MD [facility] Pager [phone]  If 7PM-7AM, please contact night-coverage [email or web address] Password [email or web address] 03/31/2019, 7:10 AM

## 2019-03-31 NOTE — Progress Notes (Signed)
Patient unable to wear CPAP due to NG tube. Will continue to monitor patient.

## 2019-03-31 NOTE — Progress Notes (Signed)
[facility] Progress Note     Subjective: CC: contained gastric perforation Patient with some continued abdominal pain, improving daily. No nausea. Passing flatus. Hopeful to be able to start some liquids tomorrow  Objective: Vital signs in last 24 hours: Temp:  [97.7 F (36.5 C)-98.4 F (36.9 C)] 98.2 F (36.8 C) (08/29 0700) Pulse Rate:  [93-113] 105 (08/29 0600) Resp:  [18-21] 21 (08/28 1100) BP: (73-131)/(39-64) 111/55 (08/29 0600) SpO2:  [88 %-100 %] 100 % (08/29 0754) Arterial Line BP: (54-132)/(47-48) 132/47 (08/28 1100) Weight:  [134.9 kg] 134.9 kg (08/29 0342) Last BM Date: 03/29/19  Intake/Output from previous day: 08/28 0701 - 08/29 0700 In: 2998.6 [I.V.:2856; IV Piggyback:142.5] Out: 1600 [Urine:1600] Intake/Output this shift: No intake/output data recorded.  PE: [id number] but easily awakened, pleasant Card:Sinus tachycardia in low 100s Pulm: Normal effort, clear to auscultation bilaterally Abd: Soft,TTP in epigastrium only today, no guarding, no peritonitis, non-distended, +BS Skin: warm and dry, no rashes  Psych: A&Ox3   Lab Results:  Recent Labs    03/30/19 0611 03/31/19 0302  WBC 13.1* 9.8  HGB 8.1* 7.4*  HCT 25.7* 24.4*  PLT 275 322   BMET Recent Labs    03/30/19 0611 03/31/19 0302  NA 138 138  K 4.0 3.9  CL 107 106  CO2 24 23  GLUCOSE 100* 95  BUN 21 17  CREATININE 0.81 0.77  CALCIUM 7.8* 7.9*   PT/INR No results for input(s): LABPROT, INR in the last 72 hours. CMP     Component Value Date/Time   NA 138 03/31/2019 0302   K 3.9 03/31/2019 0302   CL 106 03/31/2019 0302   CO2 23 03/31/2019 0302   GLUCOSE 95 03/31/2019 0302   BUN 17 03/31/2019 0302   CREATININE 0.77 03/31/2019 0302   CALCIUM 7.9 (L) 03/31/2019 0302   PROT 5.1 (L) 03/31/2019 0302   ALBUMIN 1.9 (L) 03/31/2019 0302   AST 10 (L) 03/31/2019 0302   ALT 12 03/31/2019 0302   ALKPHOS 48 03/31/2019 0302   BILITOT 1.1 03/31/2019 0302   GFRNONAA  >60 03/31/2019 0302   GFRAA >60 03/31/2019 0302   Lipase     Component Value Date/Time   LIPASE 28 03/28/2019 0524       Studies/Results: Dg Abd 1 View  Result Date: 03/29/2019 CLINICAL DATA:  Perforation of stomach after ingestion of foreign material. NG tube placement. EXAM: ABDOMEN - 1 VIEW COMPARISON:  CT 03/28/2019. FINDINGS: NG tube noted coiled in the left upper quadrant most likely in the fundus of the stomach. No gastric distention. No bowel distention. No free air identified. Prior thoracolumbar spine fusion. Prior thoracic and lumbar vertebroplasties. IMPRESSION: NG tube noted coiled of the fundus of the stomach. No gastric distention. Electronically Signed   By: Marcello Moores  Register   On: 03/29/2019 11:46   Dg Chest Port 1 View  Result Date: 03/31/2019 CLINICAL DATA:  Respiratory failure EXAM: PORTABLE CHEST 1 VIEW COMPARISON:  02/08/2017 FINDINGS: Nasogastric tube terminates at the body of the stomach. Thoracic spine fixation. Midline trachea. Normal heart size. Small left pleural effusion, similar. No pneumothorax. Low lung volumes. Left base airspace disease. IMPRESSION: Small left pleural effusion with left base atelectasis versus infection. Electronically Signed   By: Abigail Miyamoto M.D.   On: 03/31/2019 06:47    Anti-infectives: Anti-infectives (From admission, onward)   Start     Dose/Rate Route Frequency Ordered Stop   03/28/19 1500  piperacillin-tazobactam (ZOSYN) IVPB 3.375 g     3.375  g 12.5 mL/hr over 240 Minutes Intravenous Every 8 hours 03/28/19 1004     03/28/19 1400  piperacillin-tazobactam (ZOSYN) IVPB 3.375 g  Status:  Discontinued     3.375 g 100 mL/hr over 30 Minutes Intravenous Every 8 hours 03/28/19 1001 03/28/19 1003   03/28/19 0845  piperacillin-tazobactam (ZOSYN) IVPB 3.375 g     3.375 g 100 mL/hr over 30 Minutes Intravenous  Once 03/28/19 0840 03/28/19 1002       Assessment/Plan Asthma H/O CVA H/O lymphoma HTN H/O contained gastric  perforation in 2018  Contained gastric ulcer perforation with hematemesis -patient appears to have a contained gastric ulcer perforation at the same site as previously noted in 2018. - hgb 7.4, VSS, s/p 1 unit PRBC 8/27 - continue PPI gtt - patient TTP over epigastrium but no peritonitis, no indication for emergent surgical intervention - ok to clamp NGT some for now, but still strict NPO - plan UGI tomorrow  FEN -NPO/NGT/IVFs/protonix gtt VTE -would hold given hematemesis ID -zosyn 8/26>>  LOS: 3 days    Brigid Re , [facility] 03/31/2019, 9:10 AM Pager: [phone] Consults: [phone] 7:00 AM - 4:30 PM M, W-F 7:00 AM - 11:30 AM Tues, Sat, Sun

## 2019-04-01 ENCOUNTER — Inpatient Hospital Stay (HOSPITAL_COMMUNITY): Payer: Medicare Other

## 2019-04-01 LAB — TYPE AND SCREEN
ABO/RH(D): A POS
Antibody Screen: NEGATIVE
Unit division: 0
Unit division: 0

## 2019-04-01 LAB — CBC
HCT: 26.7 % — ABNORMAL LOW (ref 36.0–46.0)
Hemoglobin: 8.3 g/dL — ABNORMAL LOW (ref 12.0–15.0)
MCH: 27.9 pg (ref 26.0–34.0)
MCHC: 31.1 g/dL (ref 30.0–36.0)
MCV: 89.9 fL (ref 80.0–100.0)
Platelets: 317 10*3/uL (ref 150–400)
RBC: 2.97 MIL/uL — ABNORMAL LOW (ref 3.87–5.11)
RDW: 18.5 % — ABNORMAL HIGH (ref 11.5–15.5)
WBC: 8.3 10*3/uL (ref 4.0–10.5)
nRBC: 0 % (ref 0.0–0.2)

## 2019-04-01 LAB — GLUCOSE, CAPILLARY: Glucose-Capillary: 85 mg/dL (ref 70–99)

## 2019-04-01 LAB — BPAM RBC
Blood Product Expiration Date: 202009182359
Blood Product Expiration Date: 202009192359
ISSUE DATE / TIME: 202008271000
ISSUE DATE / TIME: 202008291237
Unit Type and Rh: 6200
Unit Type and Rh: 6200

## 2019-04-01 LAB — BASIC METABOLIC PANEL
Anion gap: 11 (ref 5–15)
BUN: 16 mg/dL (ref 8–23)
CO2: 24 mmol/L (ref 22–32)
Calcium: 8.4 mg/dL — ABNORMAL LOW (ref 8.9–10.3)
Chloride: 103 mmol/L (ref 98–111)
Creatinine, Ser: 0.76 mg/dL (ref 0.44–1.00)
GFR calc Af Amer: >60 mL/min (ref >60)
GFR calc non Af Amer: >60 mL/min (ref >60)
Glucose, Bld: 94 mg/dL (ref 70–99)
Potassium: 3.8 mmol/L (ref 3.5–5.1)
Sodium: 138 mmol/L (ref 135–145)

## 2019-04-01 MED ORDER — POTASSIUM CHLORIDE 10 MEQ/100ML IV SOLN
10.0000 meq | INTRAVENOUS | Status: AC
  Administered 2019-04-01 (×2): 10 meq via INTRAVENOUS
  Filled 2019-04-01 (×2): qty 100

## 2019-04-01 MED ORDER — FUROSEMIDE 10 MG/ML IJ SOLN
20.0000 mg | Freq: Once | INTRAMUSCULAR | Status: AC
  Administered 2019-04-01: 20 mg via INTRAVENOUS
  Filled 2019-04-01: qty 2

## 2019-04-01 MED ORDER — LEVALBUTEROL HCL 0.63 MG/3ML IN NEBU
0.6300 mg | INHALATION_SOLUTION | Freq: Three times a day (TID) | RESPIRATORY_TRACT | Status: DC
  Administered 2019-04-01 – 2019-04-03 (×5): 0.63 mg via RESPIRATORY_TRACT
  Filled 2019-04-01 (×5): qty 3

## 2019-04-01 MED ORDER — LEVALBUTEROL HCL 0.63 MG/3ML IN NEBU
0.6300 mg | INHALATION_SOLUTION | Freq: Four times a day (QID) | RESPIRATORY_TRACT | Status: DC
  Administered 2019-04-01: 0.63 mg via RESPIRATORY_TRACT
  Filled 2019-04-01: qty 3

## 2019-04-01 NOTE — Progress Notes (Signed)
Called tele to confirm rhythm at 2242, informed patient NS with ST elevation in MCL lead.   Paged Bodenheimer at [phone] and informed that I alled tele to confirm patient has ST elevation in MCL lead with NSinus.Tele did not notify me, I called. Making you aware. Paged NP Bodenheimer at [phone]. EKG ordered.  Received call for Bodenheimer at [phone]; informed EKG complete. Informed to continue to monitor patient and notify if she has chest pain.   Called RT at [phone] inquired about night CPAP. Informed to increase O2 if necessary if change in saturation; patient resting comfortably in 90's with no distress.   Notified by tele at 0142 patient desat to 65% then returned to 90's. RN to bedside, patient sleeping but aroused with RN interaction. Patient 88% on 2L, increased to 4L. Notified RT O2 increased; patient has sleep apnea.   Notified by tele at 0152, SVT 158 one second in length; patient resting/sleeping with no distress. Notified NP Bodenheimer at 0158.

## 2019-04-01 NOTE — Progress Notes (Signed)
Patient transferred from 64M to 5W 29. Alert and oriented x 4. Daughter at bedside. On 2L [place]. Purewick in place with 400 mL's voided since arrival to unit. Will continue to monitor.  Hiram Comber, RN 04/01/2019 4:10 PM

## 2019-04-01 NOTE — Evaluation (Signed)
Occupational Therapy Evaluation Patient Details Name: [name] MRN: [id number] DOB: October 17, 1940 Today's Date: 04/01/2019    History of Present Illness 78yo female admitted 8/26 with hypotension in the setting of probable contained perforated peptic ulcer    Clinical Impression   PTA, pt was living alone and was working as a Print production planner. Pt currently requiring Min A for UB ADLs, Max A for LB ADLs, and Mod A +2 for functional transfers. Pt presenting with decreased activity tolerance, balance, and strength. Pt highly motivated to participate in therapy and return to PLOF. Pt would benefit from further acute OT to facilitate safe dc. Pending pt's progress, recommend dc to home with HHOT for further OT to optimize safety, independence with ADLs, and return to PLOF.      Follow Up Recommendations  Home health OT;Supervision/Assistance - 24 hour(Would benefit from [facility] progress if eligible) May need SNF if poor progress with mobility.    Equipment Recommendations  None recommended by OT    Recommendations for Other Services PT consult     Precautions / Restrictions Precautions Precautions: Fall Precaution Comments: NG Tube Restrictions Weight Bearing Restrictions: No      Mobility Bed Mobility               General bed mobility comments: Up in recliner upon arrival  Transfers Overall transfer level: Needs assistance Equipment used: 2 person hand held assist Transfers: Sit to/from Stand Sit to Stand: Mod assist;+2 safety/equipment         General transfer comment: Light mod assist to power up; cues for hand placement and safety; Very fatigued, and tolerated standing for approx 5-10 seconds before needing to sit back to chair    Balance Overall balance assessment: Needs assistance Sitting-balance support: No upper extremity supported;Feet supported Sitting balance-Leahy Scale: Fair       Standing balance-Leahy Scale: Poor Standing balance comment:  dependent on external support                           ADL either performed or assessed with clinical judgement   ADL Overall ADL's : Needs assistance/impaired Eating/Feeding: NPO   Grooming: Set up;Supervision/safety;Sitting   Upper Body Bathing: Minimal assistance;Sitting   Lower Body Bathing: Maximal assistance;Sit to/from stand   Upper Body Dressing : Minimal assistance;Sitting   Lower Body Dressing: Maximal assistance;Sit to/from stand               Functional mobility during ADLs: +2 for safety/equipment;+2 for physical assistance;Moderate assistance(sit<>stand only) General ADL Comments: Pt presenting with decreased strength, balance, and activity tolerance     Vision         Perception     Praxis      Pertinent Vitals/Pain Pain Assessment: Faces Faces Pain Scale: Hurts little more Pain Location: abdomen, also grimace can be related to general effort with transfers Pain Descriptors / Indicators: Grimacing Pain Intervention(s): Monitored during session;Limited activity within patient's tolerance;Repositioned     Hand Dominance Right   Extremity/Trunk Assessment Upper Extremity Assessment Upper Extremity Assessment: Generalized weakness(increased edema)   Lower Extremity Assessment Lower Extremity Assessment: Defer to PT evaluation   Cervical / Trunk Assessment Cervical / Trunk Assessment: Other exceptions Cervical / Trunk Exceptions: Increased body habitus and prior back surgeries   Communication Communication Communication: No difficulties   Cognition Arousal/Alertness: Awake/alert Behavior During Therapy: WFL for tasks assessed/performed Overall Cognitive Status: Within Functional Limits for tasks assessed  General Comments  SpO2 >93% on 2L O2. HR elevating to 137 with standing. BP 133/77    Exercises Exercises: General Upper Extremity;Other exercises General Exercises - Upper  Extremity Elbow Flexion: AROM;Both;10 reps;Seated Elbow Extension: AROM;Both;10 reps;Seated Wrist Flexion: AROM;Both;10 reps;Seated Wrist Extension: AROM;Both;10 reps;Seated Digit Composite Flexion: AROM;Both;10 reps;Seated Composite Extension: AROM;Both;10 reps;Seated Other Exercises Other Exercises: Providing education to pt and daughter on edema management Other Exercises: Retrograde massage at bilateral hands and forearms   Shoulder Instructions      Home Living Family/patient expects to be discharged to:: Private residence Living Arrangements: Alone Available Help at Discharge: Family;Available 24 hours/day(Pt has five daughters) Type of Home: House Home Access: Stairs to enter;Ramped entrance(back door)   Entrance Stairs-Rails: Can reach both Home Layout: One level     Bathroom Shower/Tub: Walk-in Hydrologist: Standard     Home Equipment: Bedside commode;Shower seat;Cane - single point;Cane - quad   Additional Comments: Family lives nearby      Prior Functioning/Environment Level of Independence: Independent with assistive device(s)        Comments: Use of cane with community mobility. Performs ALls, IADLs, and driving        OT Problem List: Decreased strength;Decreased range of motion;Decreased activity tolerance;Impaired balance (sitting and/or standing);Decreased knowledge of use of DME or AE;Decreased knowledge of precautions      OT Treatment/Interventions: Self-care/ADL training;Energy conservation;Therapeutic exercise;DME and/or AE instruction;Therapeutic activities;Patient/family education    OT Goals(Current goals can be found in the care plan section) Acute Rehab OT Goals Patient Stated Goal: Wants to get home OT Goal Formulation: With patient Time For Goal Achievement: 04/15/19 Potential to Achieve Goals: Good  OT Frequency: Min 3X/week   Barriers to D/C:            Co-evaluation PT/OT/SLP Co-Evaluation/Treatment:  Yes Reason for Co-Treatment: For patient/therapist safety PT goals addressed during session: Mobility/safety with mobility OT goals addressed during session: ADL's and self-care      AM-PAC OT "6 Clicks" Daily Activity     Outcome Measure Help from another person eating meals?: Total Help from another person taking care of personal grooming?: A Little Help from another person toileting, which includes using toliet, bedpan, or urinal?: A Lot Help from another person bathing (including washing, rinsing, drying)?: A Lot Help from another person to put on and taking off regular upper body clothing?: A Little Help from another person to put on and taking off regular lower body clothing?: A Lot 6 Click Score: 13   End of Session Equipment Utilized During Treatment: Oxygen Nurse Communication: Mobility status  Activity Tolerance: Patient tolerated treatment well Patient left: in chair;with call bell/phone within reach;with family/visitor present  OT Visit Diagnosis: Unsteadiness on feet (R26.81);Other abnormalities of gait and mobility (R26.89);Muscle weakness (generalized) (M62.81)                Time: [id number] OT Time Calculation (min): 27 min Charges:  OT General Charges $OT Visit: 1 Visit OT Evaluation $OT Eval Moderate Complexity: Diller, OTR/L Acute Rehab Pager: [phone] Office: [place] 04/01/2019, 4:40 PM

## 2019-04-01 NOTE — Progress Notes (Signed)
PROGRESS NOTE    [name]  [id number] DOB: [birth year] DOA: 03/28/2019 PCP: Leanna Battles, MD   Brief Narrative: 78 year old with past medical history significant for hypertension, CVA perforated gastric ulcer 2018 who presented on 8/20 2:06 days of epigastric pain.  Subsequently patient developed nausea and dry heavies and progressive malaise.  In the ED is she had an episode of hematemesis.  CT scan revealed a contained perforated peptic ulcer.  Patient had progressive hypotension in the ER and she was admitted under PCCM service.  Patient was also evaluated by general surgery and GI.  Patient admitted with hypovolemic shock in the setting of GI bleed and sepsis from perforated gastric ulcer.  She was a started on IV fluids, IV Protonix drip and levo fed.    Assessment & Plan:   Principal Problem:   Perforated gastric ulcer ([place]) Active Problems:   Hypotension   Hematemesis   Acute lower UTI   Leukocytosis   Hyponatremia   Perforated peptic ulcer (HCC)   1-Hypovolemic and septic shock; related to hematemesis and perforated gastric ulcer.  Patient was a started on IV fluids levophed.  Continue with IV Zosyn. Blood transfusion as needed. She has received 2 units of PRBC during this hospitalization.  BP stable.   2-Contain perforated gastric ulcer with hematemesis: Upper GI bleed.  She was started on protonix Gtt-- transition to 40 Mg IV BID tonight  NG tube in place.  S/P one unit PRBC on 8/27, 8/29. General surgery following, recommending UGI tomorrow.   3-HTN; continue to hold lopressor, olmersarta, Norvasc and HCTZ.   4-A fib;  Rate controlled currently.  Could use low dose Metoprolol if hr increases.   5-H/O COPD; Continue with home BD  Per nurse patient develops wheezing, will schedule nebulizer and check chest x ray.   Anasarca;  Will give one dose of IV lasix and albumin.  Decreased IV fluids.   Hx OSA;  CPAP    Estimated body mass index is  56.19 kg/m as calculated from the following:   Height as of this encounter: 5\' 1"  (1.549 m).   Weight as of this encounter: 134.9 kg.   DVT prophylaxis: SCD Code Status: Full Code Family Communication: family at bedside.  Disposition Plan: remain in the hospital for treatment of perforated Gastric ulcer, not taking oral.  Consultants:   General sx  GI    Procedures:   None  Antimicrobials:  Zosyn   Subjective: Denies abdominal pain. Denies dyspnea  Objective: Vitals:   04/01/19 0418 04/01/19 0500 04/01/19 0600 04/01/19 0723  BP: (!) 124/44 (!) 103/51 (!) 129/56   Pulse: 98 79 75   Resp: 19 17 (!) 24   Temp:    98 F (36.7 C)  TempSrc:    Oral  SpO2: 96% 98% 90%   Weight:      Height:        Intake/Output Summary (Last 24 hours) at 04/01/2019 0727 Last data filed at 04/01/2019 0600 Gross per 24 hour  Intake 2948.99 ml  Output 1700 ml  Net 1248.99 ml   Filed Weights   03/29/19 0354 03/30/19 0406 03/31/19 0342  Weight: 130.9 kg 132.5 kg 134.9 kg    Examination:  General exam: NAD Respiratory system: Normal resp effort, no ronchus Cardiovascular system: S 1, S 2 IRR Gastrointestinal system: BS present, soft, obese, no rigidity  Central nervous system: non focal.  Extremities: plus 2 edema Skin: No rashes, lesions or ulcers   Data Reviewed: I  have personally reviewed following labs and imaging studies  CBC: Recent Labs  Lab 03/29/19 0358 03/29/19 1645 03/30/19 0611 03/31/19 0302 03/31/19 1842 04/01/19 0438  WBC 14.3* 16.0* 13.1* 9.8  --  8.3  HGB 7.0* 8.4* 8.1* 7.4* 8.2* 8.3*  HCT 22.4* 26.7* 25.7* 24.4* 26.8* 26.7*  MCV 93.3 93.4 91.8 96.1  --  89.9  PLT 272 278 275 322  --  [id number]   Basic Metabolic Panel: Recent Labs  Lab 03/28/19 0524 03/29/19 0358 03/30/19 0611 03/31/19 0302 04/01/19 0438  NA 133* 137 138 138 138  K 5.1 4.1 4.0 3.9 3.8  CL 95* 106 107 106 103  CO2 26 25 24 23 24   GLUCOSE 144* 112* 100* 95 94  BUN 21 35* 21 17  16   CREATININE 0.95 0.88 0.81 0.77 0.76  CALCIUM 9.1 7.4* 7.8* 7.9* 8.4*  MG  --  1.8 1.7 1.9  --   PHOS  --  3.6 3.0  --   --    GFR: Estimated Creatinine Clearance: 74.4 mL/min (by C-G formula based on SCr of 0.76 mg/dL). Liver Function Tests: Recent Labs  Lab 03/28/19 0524 03/31/19 0302  AST 39 10*  ALT 27 12  ALKPHOS 94 48  BILITOT 2.0* 1.1  PROT 6.8 5.1*  ALBUMIN 3.3* 1.9*   Recent Labs  Lab 03/28/19 0524  LIPASE 28   No results for input(s): AMMONIA in the last 168 hours. Coagulation Profile: No results for input(s): INR, PROTIME in the last 168 hours. Cardiac Enzymes: No results for input(s): CKTOTAL, CKMB, CKMBINDEX, TROPONINI in the last 168 hours. BNP (last 3 results) No results for input(s): PROBNP in the last 8760 hours. HbA1C: No results for input(s): HGBA1C in the last 72 hours. CBG: Recent Labs  Lab 03/31/19 1025 03/31/19 1218 03/31/19 1553  GLUCAP 85 88 84   Lipid Profile: No results for input(s): CHOL, HDL, LDLCALC, TRIG, CHOLHDL, LDLDIRECT in the last 72 hours. Thyroid Function Tests: No results for input(s): TSH, T4TOTAL, FREET4, T3FREE, THYROIDAB in the last 72 hours. Anemia Panel: No results for input(s): VITAMINB12, FOLATE, FERRITIN, TIBC, IRON, RETICCTPCT in the last 72 hours. Sepsis Labs: Recent Labs  Lab 03/28/19 1254 03/28/19 1258 03/28/19 1426 03/28/19 2059  PROCALCITON 0.26  --   --   --   LATICACIDVEN  --  1.9 2.2* 2.0*    Recent Results (from the past 240 hour(s))  SARS CORONAVIRUS 2 (TAT 6-12 HRS) Nasal Swab Aptima Multi Swab     Status: None   Collection Time: 03/28/19  8:41 AM   Specimen: Aptima Multi Swab; Nasal Swab  Result Value Ref Range Status   SARS Coronavirus 2 NEGATIVE NEGATIVE Final    Comment: (NOTE) SARS-CoV-2 target nucleic acids are NOT DETECTED. The SARS-CoV-2 RNA is generally detectable in upper and lower respiratory specimens during the acute phase of infection. Negative results do not preclude  SARS-CoV-2 infection, do not rule out co-infections with other pathogens, and should not be used as the sole basis for treatment or other patient management decisions. Negative results must be combined with clinical observations, patient history, and epidemiological information. The expected result is Negative. Fact Sheet for Patients: SugarRoll.be Fact Sheet for Healthcare Providers: https://www.woods-mathews.com/ This test is not yet approved or cleared by the [place] FDA and  has been authorized for detection and/or diagnosis of SARS-CoV-2 by FDA under an Emergency Use Authorization (EUA). This EUA will remain  in effect (meaning this test can be used) for the duration  of the COVID-19 declaration under Section 56 4(b)(1) of the Act, 21 U.S.C. section 360bbb-3(b)(1), unless the authorization is terminated or revoked sooner. Performed at [facility], [place] [address], [place], [place] [address]   Urine culture     Status: Abnormal   Collection Time: 03/28/19  8:58 AM   Specimen: Urine, Random  Result Value Ref Range Status   Specimen Description URINE, RANDOM  Final   Special Requests   Final    NONE Performed at [facility], [place] [address], [place], [place] [address]    Culture >=100,000 COLONIES/mL ESCHERICHIA COLI (A)  Final   Report Status 03/30/2019 FINAL  Final   Organism ID, Bacteria ESCHERICHIA COLI (A)  Final      Susceptibility   Escherichia coli - MIC*    AMPICILLIN <=2 SENSITIVE Sensitive     CEFAZOLIN <=4 SENSITIVE Sensitive     CEFTRIAXONE <=1 SENSITIVE Sensitive     CIPROFLOXACIN <=0.25 SENSITIVE Sensitive     GENTAMICIN <=1 SENSITIVE Sensitive     IMIPENEM <=0.25 SENSITIVE Sensitive     NITROFURANTOIN 32 SENSITIVE Sensitive     TRIMETH/SULFA <=20 SENSITIVE Sensitive     AMPICILLIN/SULBACTAM <=2 SENSITIVE Sensitive     PIP/TAZO <=4 SENSITIVE Sensitive     Extended ESBL NEGATIVE Sensitive     *  >=100,000 COLONIES/mL ESCHERICHIA COLI  Culture, blood (routine x 2)     Status: None (Preliminary result)   Collection Time: 03/28/19  2:40 PM   Specimen: BLOOD  Result Value Ref Range Status   Specimen Description BLOOD RIGHT ANTECUBITAL  Final   Special Requests   Final    BOTTLES DRAWN AEROBIC ONLY Blood Culture adequate volume   Culture   Final    NO GROWTH 3 DAYS Performed at [facility], [address], [place], [place] [address]    Report Status PENDING  Incomplete  Culture, blood (routine x 2)     Status: None (Preliminary result)   Collection Time: 03/28/19  2:46 PM   Specimen: BLOOD RIGHT HAND  Result Value Ref Range Status   Specimen Description BLOOD RIGHT HAND  Final   Special Requests   Final    BOTTLES DRAWN AEROBIC ONLY Blood Culture adequate volume   Culture   Final    NO GROWTH 3 DAYS Performed at [facility], [place] [address], [place], [place] [address]    Report Status PENDING  Incomplete  MRSA PCR Screening     Status: None   Collection Time: 03/30/19  9:37 AM   Specimen: Nasopharyngeal  Result Value Ref Range Status   MRSA by PCR NEGATIVE NEGATIVE Final    Comment:        The GeneXpert MRSA Assay (FDA approved for NASAL specimens only), is one component of a comprehensive MRSA colonization surveillance program. It is not intended to diagnose MRSA infection nor to guide or monitor treatment for MRSA infections. Performed at [facility], [place] [address] [place], [place] [id number]          Radiology Studies: Dg Chest Port 1 View  Result Date: 03/31/2019 CLINICAL DATA:  Respiratory failure EXAM: PORTABLE CHEST 1 VIEW COMPARISON:  02/08/2017 FINDINGS: Nasogastric tube terminates at the body of the stomach. Thoracic spine fixation. Midline trachea. Normal heart size. Small left pleural effusion, similar. No pneumothorax. Low lung volumes. Left base airspace disease. IMPRESSION: Small left pleural effusion with left base  atelectasis versus infection. Electronically Signed  By: Abigail Miyamoto M.D.   On: 03/31/2019 06:47        Scheduled Meds: . Chlorhexidine Gluconate Cloth  6 each Topical Daily  . fluticasone furoate-vilanterol  1 puff Inhalation Daily  . ondansetron (ZOFRAN) IV  4 mg Intravenous Once  . pantoprazole  40 mg Intravenous Q12H  . sodium chloride flush  3 mL Intravenous Q12H  . umeclidinium bromide  1 puff Inhalation Daily   Continuous Infusions: . sodium chloride 100 mL/hr at 04/01/19 0600  . piperacillin-tazobactam (ZOSYN)  IV Stopped (04/01/19 0313)     LOS: 4 days    Time spent: 35 minutes.     Elmarie Shiley, MD [facility] Pager [phone]  If 7PM-7AM, please contact night-coverage [email or web address] Password [email or web address] 04/01/2019, 7:27 AM

## 2019-04-01 NOTE — Progress Notes (Addendum)
[facility] Progress Note     Subjective: CC: contained gastric perforation Patient with minimal abdominal pain. Denies nausea. +Flatus. Hoping she will be able to drink something today.   Objective: Vital signs in last 24 hours: Temp:  [97.9 F (36.6 C)-98.3 F (36.8 C)] 98 F (36.7 C) (08/30 0723) Pulse Rate:  [75-103] 82 (08/30 0800) Resp:  [16-24] 24 (08/30 0600) BP: (98-131)/(31-83) 123/44 (08/30 0800) SpO2:  [90 %-100 %] 99 % (08/30 0800) Last BM Date: 03/29/19  Intake/Output from previous day: 08/29 0701 - 08/30 0700 In: 3049 [I.V.:2590.1; Blood:316; IV Piggyback:142.9] Out: 1700 [Urine:1700] Intake/Output this shift: Total I/O In: 102.2 [I.V.:98.5; IV Piggyback:3.7] Out: 650 [Urine:650]  PE: [id number] but easily awakened, pleasant Card:RRR Pulm: Normal effort, clear to auscultation bilaterally Abd: Soft,TTP inepigastrium only today, no guarding, no peritonitis, non-distended, +BS Skin: warm and dry, no rashes  Psych: A&Ox3  Lab Results:  Recent Labs    03/31/19 0302 03/31/19 1842 04/01/19 0438  WBC 9.8  --  8.3  HGB 7.4* 8.2* 8.3*  HCT 24.4* 26.8* 26.7*  PLT 322  --  317   BMET Recent Labs    03/31/19 0302 04/01/19 0438  NA 138 138  K 3.9 3.8  CL 106 103  CO2 23 24  GLUCOSE 95 94  BUN 17 16  CREATININE 0.77 0.76  CALCIUM 7.9* 8.4*   PT/INR No results for input(s): LABPROT, INR in the last 72 hours. CMP     Component Value Date/Time   NA 138 04/01/2019 0438   K 3.8 04/01/2019 0438   CL 103 04/01/2019 0438   CO2 24 04/01/2019 0438   GLUCOSE 94 04/01/2019 0438   BUN 16 04/01/2019 0438   CREATININE 0.76 04/01/2019 0438   CALCIUM 8.4 (L) 04/01/2019 0438   PROT 5.1 (L) 03/31/2019 0302   ALBUMIN 1.9 (L) 03/31/2019 0302   AST 10 (L) 03/31/2019 0302   ALT 12 03/31/2019 0302   ALKPHOS 48 03/31/2019 0302   BILITOT 1.1 03/31/2019 0302   GFRNONAA >60 04/01/2019 0438   GFRAA >60 04/01/2019 0438   Lipase      Component Value Date/Time   LIPASE 28 03/28/2019 0524       Studies/Results: Dg Chest Port 1 View  Result Date: 03/31/2019 CLINICAL DATA:  Respiratory failure EXAM: PORTABLE CHEST 1 VIEW COMPARISON:  02/08/2017 FINDINGS: Nasogastric tube terminates at the body of the stomach. Thoracic spine fixation. Midline trachea. Normal heart size. Small left pleural effusion, similar. No pneumothorax. Low lung volumes. Left base airspace disease. IMPRESSION: Small left pleural effusion with left base atelectasis versus infection. Electronically Signed   By: Abigail Miyamoto M.D.   On: 03/31/2019 06:47    Anti-infectives: Anti-infectives (From admission, onward)   Start     Dose/Rate Route Frequency Ordered Stop   03/28/19 1500  piperacillin-tazobactam (ZOSYN) IVPB 3.375 g     3.375 g 12.5 mL/hr over 240 Minutes Intravenous Every 8 hours 03/28/19 1004     03/28/19 1400  piperacillin-tazobactam (ZOSYN) IVPB 3.375 g  Status:  Discontinued     3.375 g 100 mL/hr over 30 Minutes Intravenous Every 8 hours 03/28/19 1001 03/28/19 1003   03/28/19 0845  piperacillin-tazobactam (ZOSYN) IVPB 3.375 g     3.375 g 100 mL/hr over 30 Minutes Intravenous  Once 03/28/19 0840 03/28/19 1002       Assessment/Plan Asthma H/O CVA H/O lymphoma HTN H/O contained gastric perforation in 2018  Contained gastric ulcer perforation with hematemesis -patient appears to  have a contained gastric ulcer perforation at the same site as previously noted in 2018. - hgb8.3, VSS, s/p 1 unit PRBC 8/27 - continue PPI gtt - patient TTP overepigastriumbut no peritonitis, no indication for emergent surgical intervention -NGT clamped and exam is stable - plan UGI today - if negative for leak can d/c NGT and start CLD  FEN -NPO/NGT/IVFs/protonix BID VTE -would hold given hematemesis ID -zosyn 8/26>>  LOS: 4 days    Brigid Re , [facility] 04/01/2019, 8:23 AM Pager: [phone] Consults:  [phone] 7:00 AM - 4:30 PM M, W-F 7:00 AM - 11:30 AM Tues, Sat, Sun  Addendum: per radiology no non-emergent UGI on the weekends, plan UGI for tomorrow AM

## 2019-04-01 NOTE — Plan of Care (Signed)
  Problem: Coping: Goal: Level of anxiety will decrease Outcome: Progressing   Problem: Elimination: Goal: Will not experience complications related to urinary retention Outcome: Progressing Note: Pt is making more than enough urine.   Problem: Pain Managment: Goal: General experience of comfort will improve Outcome: Progressing Note: Pt does not complain of any pain this morning.   Problem: Safety: Goal: Ability to remain free from injury will improve Outcome: Progressing   Problem: Skin Integrity: Goal: Risk for impaired skin integrity will decrease Outcome: Progressing   Problem: Nutrition: Goal: Adequate nutrition will be maintained Outcome: Not Progressing Note: Pt is strict NPO due to bowel perforation.

## 2019-04-01 NOTE — Evaluation (Signed)
Physical Therapy Evaluation Patient Details Name: [name] MRN: [id number] DOB: June 10, 1941 Today's Date: 04/01/2019   History of Present Illness  78yo female admitted 8/26 with hypotension in the setting of probable contained perforated peptic ulcer   Clinical Impression   Pt admitted with above diagnosis. 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 Pt will benefit from skilled PT to increase their independence and safety with mobility to allow discharge to the venue listed below.       Follow Up Recommendations Home health PT;Supervision/Assistance - 24 hour;Other (comment)(Worth considering [facility])    Equipment Recommendations  Rolling walker with 5" wheels;3in1 (PT)    Recommendations for Other Services       Precautions / Restrictions Precautions Precautions: Fall Precaution Comments: NG Tube      Mobility  Bed Mobility               General bed mobility comments: Up in recliner upon arrival  Transfers Overall transfer level: Needs assistance Equipment used: 2 person hand held assist Transfers: Sit to/from Stand Sit to Stand: Mod assist         General transfer comment: Light mod assist to power up; cues for hand placement and safety; Very fatigued, and tolerated standing for approx 5-10 seconds before needing to sit back to chair  Ambulation/Gait             General Gait Details: Deferred today due to fatigue  Stairs            Wheelchair Mobility    Modified Rankin  (Stroke Patients Only)       Balance Overall balance assessment: Needs assistance           Standing balance-Leahy Scale: Poor Standing balance comment: dependent on external support                             Pertinent Vitals/Pain Pain Assessment: Faces Faces Pain Scale: Hurts little more Pain Location: abdomen, also grimace can be related to general effort with transfers Pain Descriptors / Indicators: Grimacing Pain Intervention(s): Monitored during session    Home Living Family/patient expects to be discharged to:: Private residence Living Arrangements: Alone Available Help at Discharge: Family;Available 24 hours/day(Pt has five daughters) Type of Home: House Home Access: Stairs to enter;Ramped entrance(back door) Entrance Stairs-Rails: Can reach both   Home Layout: One level Home Equipment: Bedside commode;Shower seat;Cane - single point;Cane - quad Additional Comments: Family lives nearby    Prior Function Level of Independence: Independent with assistive device(s)         Comments: Use of cane with community mobility. Performs ALls, IADLs, and driving     Hand Dominance   Dominant Hand: Right    Extremity/Trunk Assessment   Upper Extremity Assessment Upper Extremity Assessment: Defer to OT evaluation;Overall [facility] for tasks assessed(with noted edema bil hands L worse than jR)    Lower Extremity Assessment Lower Extremity Assessment: Generalized weakness    Cervical / Trunk Assessment Cervical / Trunk Assessment: Other  exceptions Cervical / Trunk Exceptions: Increased body habitus  Communication   Communication: No difficulties  Cognition Arousal/Alertness: Awake/alert Behavior During Therapy: WFL for tasks assessed/performed Overall Cognitive Status: Within Functional Limits for tasks assessed                                        General Comments General comments (skin integrity, edema, etc.): Session conducted on  supplemental O2; O2 sts remained greater than or equal to 93%; HR varied throughout session, reaching 137 bpm at her biggest effort with mobility; recovered relatively quickly; BP sitting 133/77    Exercises     Assessment/Plan    PT Assessment Patient needs continued PT services  PT Problem List Decreased strength;Decreased range of motion;Decreased activity tolerance;Decreased balance;Decreased mobility;Decreased coordination;Decreased knowledge of use of DME;Decreased knowledge of precautions;Cardiopulmonary status limiting activity;Obesity       PT Treatment Interventions DME instruction;Gait training;Functional mobility training;Therapeutic activities;Therapeutic exercise;Balance training;Neuromuscular re-education;Patient/family education    PT Goals (Current goals can be found in the Care Plan section)  Acute Rehab PT Goals Patient Stated Goal: Wants to get home PT Goal Formulation: With patient Time For Goal Achievement: 04/15/19 Potential to Achieve Goals: Good    Frequency Min 3X/week   Barriers to discharge        Co-evaluation PT/OT/SLP Co-Evaluation/Treatment: Yes Reason for Co-Treatment: For patient/therapist safety PT goals addressed during session: Mobility/safety with mobility         AM-PAC PT "6 Clicks" Mobility  Outcome Measure Help needed turning from your back to your side while in a flat bed without using bedrails?: A Little Help needed moving from lying on your back to sitting on the side of a flat bed without using bedrails?: A Lot Help needed moving to and from a bed to a chair (including a wheelchair)?: A Lot Help needed standing up from a chair using your arms (e.g., wheelchair or bedside chair)?: A Lot Help needed to walk in hospital room?: A Lot Help needed climbing 3-5 steps with a railing? : A Lot 6 Click Score: 13    End of Session   Activity Tolerance: Patient limited by fatigue Patient left: in chair;with call bell/phone within  reach;with family/visitor present Nurse Communication: Mobility status PT Visit Diagnosis: Unsteadiness on feet (R26.81);Other abnormalities of gait and mobility (R26.89);Muscle weakness (generalized) (M62.81)    Time: [id number] PT Time Calculation (min) (ACUTE ONLY): 27 min   Charges:   PT Evaluation $PT Eval Moderate Complexity: 1 Mod          Roney Marion, PT  Acute Rehabilitation Services Pager [phone] Office [phone]   [place]ta [place] 04/01/2019, 4:11 PM

## 2019-04-01 NOTE — Progress Notes (Signed)
Pt still unable to wear CPAP due to NG tube,

## 2019-04-01 NOTE — Progress Notes (Signed)
CCS made aware that Radiology only does emergent cases on the weekend. They will schedule her tomorrow for the Upper GI.

## 2019-04-01 NOTE — Progress Notes (Signed)
Per CCS note, pt is to continue Protonix gtt. This med was discontinued and pt was started on 40mg  Protonix injection BID. Claiborne Billings, [place] said that BID Protonix will be fine.

## 2019-04-02 ENCOUNTER — Inpatient Hospital Stay (HOSPITAL_COMMUNITY): Payer: Medicare Other

## 2019-04-02 LAB — CBC
HCT: 26.5 % — ABNORMAL LOW (ref 36.0–46.0)
Hemoglobin: 8.3 g/dL — ABNORMAL LOW (ref 12.0–15.0)
MCH: 28 pg (ref 26.0–34.0)
MCHC: 31.3 g/dL (ref 30.0–36.0)
MCV: 89.5 fL (ref 80.0–100.0)
Platelets: 356 10*3/uL (ref 150–400)
RBC: 2.96 MIL/uL — ABNORMAL LOW (ref 3.87–5.11)
RDW: 17.7 % — ABNORMAL HIGH (ref 11.5–15.5)
WBC: 8.3 10*3/uL (ref 4.0–10.5)
nRBC: 0 % (ref 0.0–0.2)

## 2019-04-02 LAB — GLUCOSE, CAPILLARY
Glucose-Capillary: 91 mg/dL (ref 70–99)
Glucose-Capillary: 89 mg/dL (ref 70–99)
Glucose-Capillary: 133 mg/dL — ABNORMAL HIGH (ref 70–99)
Glucose-Capillary: 98 mg/dL (ref 70–99)
Glucose-Capillary: 130 mg/dL — ABNORMAL HIGH (ref 70–99)

## 2019-04-02 LAB — CULTURE, BLOOD (ROUTINE X 2)
Culture: NO GROWTH
Culture: NO GROWTH
Special Requests: ADEQUATE
Special Requests: ADEQUATE

## 2019-04-02 LAB — BASIC METABOLIC PANEL
Anion gap: 11 (ref 5–15)
BUN: 13 mg/dL (ref 8–23)
CO2: 27 mmol/L (ref 22–32)
Calcium: 8.3 mg/dL — ABNORMAL LOW (ref 8.9–10.3)
Chloride: 101 mmol/L (ref 98–111)
Creatinine, Ser: 0.82 mg/dL (ref 0.44–1.00)
GFR calc Af Amer: >60 mL/min (ref >60)
GFR calc non Af Amer: >60 mL/min (ref >60)
Glucose, Bld: 89 mg/dL (ref 70–99)
Potassium: 3.1 mmol/L — ABNORMAL LOW (ref 3.5–5.1)
Sodium: 139 mmol/L (ref 135–145)

## 2019-04-02 LAB — MAGNESIUM: Magnesium: 1.5 mg/dL — ABNORMAL LOW (ref 1.7–2.4)

## 2019-04-02 MED ORDER — METOPROLOL TARTRATE 25 MG PO TABS
25.0000 mg | ORAL_TABLET | Freq: Two times a day (BID) | ORAL | Status: DC
  Administered 2019-04-02 – 2019-04-04 (×5): 25 mg via ORAL
  Filled 2019-04-02 (×5): qty 1

## 2019-04-02 MED ORDER — MAGNESIUM SULFATE 2 GM/50ML IV SOLN
2.0000 g | Freq: Once | INTRAVENOUS | Status: AC
  Administered 2019-04-02: 2 g via INTRAVENOUS
  Filled 2019-04-02: qty 50

## 2019-04-02 MED ORDER — IOHEXOL 300 MG/ML  SOLN
150.0000 mL | Freq: Once | INTRAMUSCULAR | Status: AC | PRN
  Administered 2019-04-02: 150 mL via ORAL

## 2019-04-02 MED ORDER — POTASSIUM CHLORIDE 10 MEQ/100ML IV SOLN
10.0000 meq | INTRAVENOUS | Status: AC
  Administered 2019-04-02 (×4): 10 meq via INTRAVENOUS
  Filled 2019-04-02 (×4): qty 100

## 2019-04-02 MED ORDER — FUROSEMIDE 10 MG/ML IJ SOLN
20.0000 mg | Freq: Once | INTRAMUSCULAR | Status: AC
  Administered 2019-04-02: 20 mg via INTRAVENOUS
  Filled 2019-04-02: qty 2

## 2019-04-02 NOTE — Consult Note (Signed)
   [facility] Inpatient Consult   04/02/2019  [name] 14-Feb-1941 [id number]     Patient is in the ACO of the [place]. Electronic medical record reveals patient is in the Medicare ACO.  Patient's primary care provider is Dr. Leanna Battles, [facility].   Chart reviewed and notes from MD narrative as follows:  79 year old with past medical history significant for hypertension, CVA perforated gastric ulcer 2018 who presented on 8/20 2:06 days of epigastric pain.  Subsequently patient developed nausea and dry heavies and progressive malaise.  In the ED is she had an episode of hematemesis.  CT scan revealed a contained perforated peptic ulcer.  Patient had progressive hypotension in the ER. Patient is eligible for [facility] Care Management programs and services. Plan: [facility] First that patient is eligible.   For questions,  please contact:   Natividad Brood, RN BSN [facility] Liaison  [phone] business mobile phone Toll free office [phone]  Fax number: [phone] [email or web address]

## 2019-04-02 NOTE — Progress Notes (Signed)
Physical Therapy Treatment Patient Details Name: [name] MRN: [id number] DOB: 05-22-1941 Today's Date: 04/02/2019    History of Present Illness 78yo female admitted 8/26 with hypotension in the setting of probable contained perforated peptic ulcer     PT Comments    Patient reluctantly agrees to PT with encouragement. Patient very weak, lethargic, but requires only min +2 assist for all mobility. Performed supine to sit, rolling, sit to stand with min +2 assist. Able to take a few side steps up to head of bed this visit. Requires increased time and effort to perform all activities. She will continue to benefit from skilled PT acutely to improve functional independence, improve strength, and activity tolerance for return home with family at discharge.      Follow Up Recommendations  Home health PT;Supervision for mobility/OOB;Supervision/Assistance - 24 hour     Equipment Recommendations  Rolling walker with 5" wheels;3in1 (PT)    Recommendations for Other Services       Precautions / Restrictions Precautions Precautions: Fall Restrictions Weight Bearing Restrictions: No    Mobility  Bed Mobility Overal bed mobility: Needs Assistance Bed Mobility: Rolling;Supine to Sit;Sit to Supine Rolling: Min assist   Supine to sit: Min assist;+2 for physical assistance Sit to supine: +2 for physical assistance;Min assist   General bed mobility comments: increased effort to scoot towards [facility]  Transfers Overall transfer level: Needs assistance Equipment used: Rolling walker (2 wheeled) Transfers: Sit to/from Stand Sit to Stand: Min assist;+2 safety/equipment         General transfer comment: Patient stood x 2. Fatigued with standing.  Ambulation/Gait Ambulation/Gait assistance: Min assist;+2 physical assistance Gait Distance (Feet): 3 Feet Assistive device: Rolling walker (2 wheeled)   Gait velocity: decreased   General Gait Details: able to take a few side steps to get  up toward head of bed.   Stairs             Wheelchair Mobility    Modified Rankin (Stroke Patients Only)       Balance Overall balance assessment: Needs assistance Sitting-balance support: Feet supported Sitting balance-Leahy Scale: Fair     Standing balance support: Bilateral upper extremity supported Standing balance-Leahy Scale: Poor Standing balance comment: reliant on RW                            Cognition Arousal/Alertness: Awake/alert Behavior During Therapy: WFL for tasks assessed/performed Overall Cognitive Status: Within Functional Limits for tasks assessed                                        Exercises      General Comments        Pertinent Vitals/Pain Pain Assessment: Faces Faces Pain Scale: Hurts little more Pain Location: with supine to sitting up on edge of the bed. No specific location Pain Descriptors / Indicators: Moaning Pain Intervention(s): Monitored during session;Repositioned    Home Living Family/patient expects to be discharged to:: Private residence Living Arrangements: Alone Available Help at Discharge: Family;Available 24 hours/day Type of Home: House              Prior Function            PT Goals (current goals can now be found in the care plan section) Acute Rehab PT Goals Patient Stated Goal: Wants to get home PT Goal Formulation:  With patient Time For Goal Achievement: 04/15/19 Potential to Achieve Goals: Good Progress towards PT goals: Progressing toward goals    Frequency    Min 3X/week      PT Plan Current plan remains appropriate    Co-evaluation PT/OT/SLP Co-Evaluation/Treatment: Yes Reason for Co-Treatment: For patient/therapist safety;To address functional/ADL transfers PT goals addressed during session: Mobility/safety with mobility;Balance;Strengthening/ROM OT goals addressed during session: Strengthening/ROM      AM-PAC PT "6 Clicks" Mobility   Outcome  Measure  Help needed turning from your back to your side while in a flat bed without using bedrails?: A Little Help needed moving from lying on your back to sitting on the side of a flat bed without using bedrails?: A Lot Help needed moving to and from a bed to a chair (including a wheelchair)?: A Lot Help needed standing up from a chair using your arms (e.g., wheelchair or bedside chair)?: A Lot Help needed to walk in hospital room?: A Lot Help needed climbing 3-5 steps with a railing? : Total 6 Click Score: 12    End of Session Equipment Utilized During Treatment: Gait belt;Oxygen Activity Tolerance: Patient limited by lethargy;Patient limited by fatigue Patient left: in bed;with bed alarm set;with call bell/phone within reach;with family/visitor present Nurse Communication: Mobility status PT Visit Diagnosis: Unsteadiness on feet (R26.81);Muscle weakness (generalized) (M62.81);Difficulty in walking, not elsewhere classified (R26.2);Other abnormalities of gait and mobility (R26.89)     Time: [id number] PT Time Calculation (min) (ACUTE ONLY): 27 min  Charges:  $Therapeutic Activity: 8-22 mins                     Elester Apodaca, PT, GCS 04/02/19,3:31 PM

## 2019-04-02 NOTE — TOC Initial Note (Signed)
Transition of Care [facility]) - Initial/Assessment Note    Patient Details  Name: [name] MRN: [id number] Date of Birth: 09/02/1940  [facility] of [facility]) CM/SW Contact:    Carles Collet, RN Phone Number: 04/02/2019, 1:08 PM  Clinical Narrative:                Damaris Schooner w patient at bedside. She states that she lives at home alone, She has 5 daughters, the furthest one is 5 minutes away. She has great home support. She would like to go home at DC. We discussed [facility] home first, she was very interested in the exta help provided by this program and would like a referral placed. Done, and accepted. Patient states that she has RW at home, and that she has a walk in shower and shower seat at home already. No DME needs appreciated at this time.  CM will continue to follow  Expected Discharge Plan and Services Expected Discharge Plan: [place]   Discharge Planning Services: CM Consult                               HH Arranged: RN, PT, Nurse's Aide [facility] Agency: [place] Date [facility] Agency Contacted: 04/02/19 Time [place]: [phone] Representative spoke with at [place]: Tommi Rumps    Expected Discharge Plan: [place] Barriers to Discharge: Continued Medical Work up   Patient Goals and CMS Choice Patient states their goals for this hospitalization and ongoing recovery are:: to go home CMS Medicare.gov Compare Post Acute Care list provided to:: Patient Choice offered to / list presented to : Patient  Expected Discharge Plan and Services Expected Discharge Plan: [place]   Discharge Planning Services: CM Consult                               HH Arranged: RN, PT, Nurse's Aide [facility] Agency: [place] Date [facility] Agency Contacted: 04/02/19 Time HH Agency Contacted: 42 Representative spoke with at [place]: Tommi Rumps  Prior Living Arrangements/Services     Patient language and need for interpreter  reviewed:: Yes Do you feel safe going back to the place where you live?: Yes            Criminal Activity/Legal Involvement Pertinent to Current Situation/Hospitalization: No - Comment as needed  Activities of Daily Living Home Assistive Devices/Equipment: Nebulizer, Wheelchair, Environmental consultant (specify type) ADL Screening (condition at time of admission) Patient's cognitive ability adequate to safely complete daily activities?: Yes Is the patient deaf or have difficulty hearing?: No Does the patient have difficulty seeing, even when wearing glasses/contacts?: No Does the patient have difficulty concentrating, remembering, or making decisions?: No Patient able to express need for assistance with ADLs?: Yes Does the patient have difficulty dressing or bathing?: No Independently performs ADLs?: Yes (appropriate for developmental age) Does the patient have difficulty walking or climbing stairs?: Yes Weakness of Legs: Both Weakness of Arms/Hands: None  Permission Sought/Granted                  Emotional Assessment              Admission diagnosis:  Perforated peptic ulcer ([place]) [K27.5] Patient Active Problem List   Diagnosis Date Noted  . Perforated gastric ulcer ([place]) 03/28/2019  . Hypotension 03/28/2019  . Hematemesis  03/28/2019  . Acute lower UTI 03/28/2019  . Leukocytosis 03/28/2019  . Hyponatremia 03/28/2019  . Perforated peptic ulcer ([place]) 03/28/2019  . Severe hypoxemia 08/11/2018  . 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 Moderate persistent asthma 06/14/2018  . 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 Sleeps in sitting position due to orthopnea 05/23/2018  . Right pontine stroke ([place]) 05/23/2018  . Peptic ulcer with perforation ([place]) 01/31/2017  . Stroke ([place]) 07/20/2011    Class: Acute  . Nausea and vomiting 07/18/2011  . Lateral nystagmus 07/18/2011    Class: Acute  . Lymphoma in remission  ([place]) 07/18/2011    Class: History of  . Hypertension 07/18/2011    Class: History of  . Obesity 07/18/2011    Class: Chronic  . SVT (supraventricular tachycardia) ([place]) 07/18/2011    Class: History of  . Depression 07/18/2011    Class: History of  . History of hypokalemia 07/18/2011    Class: History of  . Back pain, chronic 07/18/2011    Class: Chronic  . 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    Class: History of  . DISORDER OF BONE AND CARTILAGE UNSPECIFIED 04/29/2008   PCP:  Leanna Battles, MD Pharmacy:   [facility] [id number], [place] [address] [place] [address] Phone: [phone] Fax: [phone]     Social Determinants of Health (SDOH) Interventions    Readmission Risk Interventions No flowsheet data found.

## 2019-04-02 NOTE — Progress Notes (Signed)
PROGRESS NOTE    [name]  [id number] DOB: January 25, 1941 DOA: 03/28/2019 PCP: Leanna Battles, MD   Brief Narrative: 78 year old with past medical history significant for hypertension, CVA perforated gastric ulcer 2018 who presented on 8/20 2:06 days of epigastric pain.  Subsequently patient developed nausea and dry heavies and progressive malaise.  In the ED is she had an episode of hematemesis.  CT scan revealed a contained perforated peptic ulcer.  Patient had progressive hypotension in the ER and she was admitted under PCCM service.  Patient was also evaluated by general surgery and GI.  Patient admitted with hypovolemic shock in the setting of GI bleed and sepsis from perforated gastric ulcer.  She was a started on IV fluids, IV Protonix drip and levo fed.    Assessment & Plan:   Principal Problem:   Perforated gastric ulcer ([place]) Active Problems:   Hypotension   Hematemesis   Acute lower UTI   Leukocytosis   Hyponatremia   Perforated peptic ulcer (HCC)   1-Hypovolemic and septic shock; related to hematemesis and perforated gastric ulcer.  Patient was a started on IV fluids levophed.  Continue with IV Zosyn. Blood transfusion as needed. She has received 2 units of PRBC during this hospitalization.  BP stable.   2-Contain perforated gastric ulcer with hematemesis: Upper GI bleed.  -She was started on protonix Gtt-- transition to 40 Mg IV BID tonight  --S/P one unit PRBC on 8/27, 8/29. -General surgery following, recommending UGI perform 8/31, showed ulceration along the superior and inferior margins of the gastric antrum with associated fold thickening.  No leak of water soluble contrast medium from the stomach was demonstrated on today's exam. I used gentle compression with the balloon paddle..  -Plan to remove NG tube and start patient on clear diet.   3-HTN; continue to hold  olmersarta, Norvasc and HCTZ.  Resume metoprolol.   4-A fib;  Rate controlled  currently.  Will resume metoprolol.   5-H/O COPD; Continue with home BD  Mild sporadic wheezing, schedule nebulizer, incentive spirometry.  Incentive spirometry   Anasarca;  Received one dose of lasix 8/30, repeat lasix today.  Stop IV fluids.   Hx OSA;  CPAP   Hypokalemia; replete IV.   Estimated body mass index is 58.15 kg/m as calculated from the following:   Height as of this encounter: 5\' 1"  (1.549 m).   Weight as of this encounter: 139.6 kg.   DVT prophylaxis: SCD Code Status: Full Code Family Communication: family at bedside.  Disposition Plan: remain in the hospital for treatment of perforated Gastric ulcer, not taking oral.  Consultants:   General sx  GI    Procedures:   None  Antimicrobials:  Zosyn   Subjective: She report mild dyspnea. She is happy to hear she will be able to drink fluids.    Objective: Vitals:   04/02/19 0145 04/02/19 0409 04/02/19 0500 04/02/19 0848  BP:  (!) 126/54    Pulse: 91 83    Resp: 16 (!) 21    Temp:  98.3 F (36.8 C)    TempSrc:  Oral    SpO2: 98% 98%  99%  Weight:   (!) 139.6 kg   Height:        Intake/Output Summary (Last 24 hours) at 04/02/2019 0958 Last data filed at 04/02/2019 0045 Gross per 24 hour  Intake 582.75 ml  Output 2650 ml  Net -2067.25 ml   Filed Weights   03/30/19 0406 03/31/19 0342 04/02/19 0500  Weight: 132.5 kg 134.9 kg (!) 139.6 kg    Examination:  General exam: NAD Respiratory system: Sporadic wheezing.  Cardiovascular system: S 1, S 2 IRR Gastrointestinal system: BS present, soft, nt Central nervous system: non focal.  Extremities; plus 2 edema Skin: no rashes.    Data Reviewed: I have personally reviewed following labs and imaging studies  CBC: Recent Labs  Lab 03/29/19 1645 03/30/19 0611 03/31/19 0302 03/31/19 1842 04/01/19 0438 04/02/19 0434  WBC 16.0* 13.1* 9.8  --  8.3 8.3  HGB 8.4* 8.1* 7.4* 8.2* 8.3* 8.3*  HCT 26.7* 25.7* 24.4* 26.8* 26.7* 26.5*  MCV  93.4 91.8 96.1  --  89.9 89.5  PLT 278 275 322  --  317 [id number]   Basic Metabolic Panel: Recent Labs  Lab 03/29/19 0358 03/30/19 0611 03/31/19 0302 04/01/19 0438 04/02/19 0434  NA 137 138 138 138 139  K 4.1 4.0 3.9 3.8 3.1*  CL 106 107 106 103 101  CO2 25 24 23 24 27   GLUCOSE 112* 100* 95 94 89  BUN 35* 21 17 16 13   CREATININE 0.88 0.81 0.77 0.76 0.82  CALCIUM 7.4* 7.8* 7.9* 8.4* 8.3*  MG 1.8 1.7 1.9  --   --   PHOS 3.6 3.0  --   --   --    GFR: Estimated Creatinine Clearance: 74.2 mL/min (by C-G formula based on SCr of 0.82 mg/dL). Liver Function Tests: Recent Labs  Lab 03/28/19 0524 03/31/19 0302  AST 39 10*  ALT 27 12  ALKPHOS 94 48  BILITOT 2.0* 1.1  PROT 6.8 5.1*  ALBUMIN 3.3* 1.9*   Recent Labs  Lab 03/28/19 0524  LIPASE 28   No results for input(s): AMMONIA in the last 168 hours. Coagulation Profile: No results for input(s): INR, PROTIME in the last 168 hours. Cardiac Enzymes: No results for input(s): CKTOTAL, CKMB, CKMBINDEX, TROPONINI in the last 168 hours. BNP (last 3 results) No results for input(s): PROBNP in the last 8760 hours. HbA1C: No results for input(s): HGBA1C in the last 72 hours. CBG: Recent Labs  Lab 03/31/19 1218 03/31/19 1553 04/01/19 2311 04/02/19 0459 04/02/19 0755  GLUCAP 88 84 85 91 89   Lipid Profile: No results for input(s): CHOL, HDL, LDLCALC, TRIG, CHOLHDL, LDLDIRECT in the last 72 hours. Thyroid Function Tests: No results for input(s): TSH, T4TOTAL, FREET4, T3FREE, THYROIDAB in the last 72 hours. Anemia Panel: No results for input(s): VITAMINB12, FOLATE, FERRITIN, TIBC, IRON, RETICCTPCT in the last 72 hours. Sepsis Labs: Recent Labs  Lab 03/28/19 1254 03/28/19 1258 03/28/19 1426 03/28/19 2059  PROCALCITON 0.26  --   --   --   LATICACIDVEN  --  1.9 2.2* 2.0*    Recent Results (from the past 240 hour(s))  SARS CORONAVIRUS 2 (TAT 6-12 HRS) Nasal Swab Aptima Multi Swab     Status: None   Collection Time: 03/28/19   8:41 AM   Specimen: Aptima Multi Swab; Nasal Swab  Result Value Ref Range Status   SARS Coronavirus 2 NEGATIVE NEGATIVE Final    Comment: (NOTE) SARS-CoV-2 target nucleic acids are NOT DETECTED. The SARS-CoV-2 RNA is generally detectable in upper and lower respiratory specimens during the acute phase of infection. Negative results do not preclude SARS-CoV-2 infection, do not rule out co-infections with other pathogens, and should not be used as the sole basis for treatment or other patient management decisions. Negative results must be combined with clinical observations, patient history, and epidemiological information. The expected result is Negative.  Fact Sheet for Patients: SugarRoll.be Fact Sheet for Healthcare Providers: https://www.woods-mathews.com/ This test is not yet approved or cleared by the [place] FDA and  has been authorized for detection and/or diagnosis of SARS-CoV-2 by FDA under an Emergency Use Authorization (EUA). This EUA will remain  in effect (meaning this test can be used) for the duration of the COVID-19 declaration under Section 56 4(b)(1) of the Act, 21 U.S.C. section 360bbb-3(b)(1), unless the authorization is terminated or revoked sooner. Performed at [facility], [place] [address] [place], [place] [address]   Urine culture     Status: Abnormal   Collection Time: 03/28/19  8:58 AM   Specimen: Urine, Random  Result Value Ref Range Status   Specimen Description URINE, RANDOM  Final   Special Requests   Final    NONE Performed at [facility], [place] [address] [place], [place] [address]    Culture >=100,000 COLONIES/mL ESCHERICHIA COLI (A)  Final   Report Status 03/30/2019 FINAL  Final   Organism ID, Bacteria ESCHERICHIA COLI (A)  Final      Susceptibility   Escherichia coli - MIC*    AMPICILLIN <=2 SENSITIVE Sensitive     CEFAZOLIN <=4 SENSITIVE Sensitive     CEFTRIAXONE <=1 SENSITIVE  Sensitive     CIPROFLOXACIN <=0.25 SENSITIVE Sensitive     GENTAMICIN <=1 SENSITIVE Sensitive     IMIPENEM <=0.25 SENSITIVE Sensitive     NITROFURANTOIN 32 SENSITIVE Sensitive     TRIMETH/SULFA <=20 SENSITIVE Sensitive     AMPICILLIN/SULBACTAM <=2 SENSITIVE Sensitive     PIP/TAZO <=4 SENSITIVE Sensitive     Extended ESBL NEGATIVE Sensitive     * >=100,000 COLONIES/mL ESCHERICHIA COLI  Culture, blood (routine x 2)     Status: None   Collection Time: 03/28/19  2:40 PM   Specimen: BLOOD  Result Value Ref Range Status   Specimen Description BLOOD RIGHT ANTECUBITAL  Final   Special Requests   Final    BOTTLES DRAWN AEROBIC ONLY Blood Culture adequate volume   Culture   Final    NO GROWTH 5 DAYS Performed at [facility], [address], [place], [place] [address]    Report Status 04/02/2019 FINAL  Final  Culture, blood (routine x 2)     Status: None   Collection Time: 03/28/19  2:46 PM   Specimen: BLOOD RIGHT HAND  Result Value Ref Range Status   Specimen Description BLOOD RIGHT HAND  Final   Special Requests   Final    BOTTLES DRAWN AEROBIC ONLY Blood Culture adequate volume   Culture   Final    NO GROWTH 5 DAYS Performed at [facility], [place] [address] [place], [place] [address]    Report Status 04/02/2019 FINAL  Final  MRSA PCR Screening     Status: None   Collection Time: 03/30/19  9:37 AM   Specimen: Nasopharyngeal  Result Value Ref Range Status   MRSA by PCR NEGATIVE NEGATIVE Final    Comment:        The GeneXpert MRSA Assay (FDA approved for NASAL specimens only), is one component of a comprehensive MRSA colonization surveillance program. It is not intended to diagnose MRSA infection nor to guide or monitor treatment for MRSA infections. Performed at [facility], [place] [address], [place],  [address]          Radiology Studies: Dg Chest Port 1 View  Result Date: 04/01/2019 CLINICAL DATA:  Shortness of breath EXAM: PORTABLE CHEST 1  VIEW COMPARISON:  03/31/2019 FINDINGS: An NG tube is noted entering the stomach with tip off field of view. Slightly improved LEFT basilar aeration with continued atelectasis/airspace disease. No pneumothorax or definite pleural effusion. Spinal surgical hardware and evidence of thoracic vertebral augmentation again noted. IMPRESSION: Improved LEFT basilar aeration without other significant change. Electronically Signed   By: Margarette Canada M.D.   On: 04/01/2019 14:17   Dg Duanne Limerick W Single Cm (sol Or Thin Ba)  Result Date: 04/02/2019 CLINICAL DATA:  Perforated gastric ulcer. EXAM: UPPER GI SERIES WITH KUB TECHNIQUE: After obtaining a scout radiograph a routine upper GI series was performed using 150 cc Omnipaque 3 Hunt FLUOROSCOPY TIME:  Fluoroscopy Time:  2 minutes, 18 seconds Radiation Exposure Index (if provided by the fluoroscopic device): 14.8 mGy Number of Acquired Spot Images: 0 COMPARISON:  CT scan 03/28/2019 FINDINGS: Initial KUB demonstrates gallstones in the right upper quadrant along with thoracolumbar spinal fixation hardware, prior lower thoracic vertebral augmentations, mild degenerative arthropathy of the hips, left greater than right. Unremarkable bowel gas pattern. Nasogastric tube is present in the stomach. The contrast medium was injected by nasogastric tube. The patient has limited mobility, but we turned tear from the supine into the right posterior oblique position in order to cause the contrast flow into the gastric antrum and duodenum. A balloon paddle was used for compression in order to better delineate the structures. There is abnormal fold thickening in the gastric antrum both along the superior and inferior margin as shown for example on image 7/10, with suspected ulcerations both superiorly and inferiorly. We did not demonstrate compelling evidence of extension of contrast from the stomach lumen. Normal duodenal morphology. The esophagus was not investigated. The pharyngeal phase of  swallowing was not assessed. IMPRESSION: 1. Ulceration along the superior and inferior margins of the gastric antrum with associated fold thickening. 2. No leak of water soluble contrast medium from the stomach was demonstrated on today's exam. I used gentle compression with the balloon paddle. 3. Contrast was injected through the NG tube. The esophagus and pharyngeal phase of swallowing were not assessed. 4. Cholelithiasis. Electronically Signed   By: Van Clines M.D.   On: 04/02/2019 08:49        Scheduled Meds:  Chlorhexidine Gluconate Cloth  6 each Topical Daily   fluticasone furoate-vilanterol  1 puff Inhalation Daily   furosemide  20 mg Intravenous Once   levalbuterol  0.63 mg Nebulization TID   ondansetron (ZOFRAN) IV  4 mg Intravenous Once   pantoprazole  40 mg Intravenous Q12H   sodium chloride flush  3 mL Intravenous Q12H   umeclidinium bromide  1 puff Inhalation Daily   Continuous Infusions:  sodium chloride 20 mL/hr at 04/01/19 1428   piperacillin-tazobactam (ZOSYN)  IV 3.375 g (04/02/19 0921)   potassium chloride 10 mEq (04/02/19 0926)     LOS: 5 days    Time spent: 35 minutes.     Elmarie Shiley, MD [facility] Pager [phone]  If 7PM-7AM, please contact night-coverage [email or web address] Password [facility] 04/02/2019, 9:58 AM

## 2019-04-02 NOTE — Progress Notes (Signed)
IMPRESSION and PLAN:    #1.  Contained gastric ulcer perforation. Neg UGI series today.  Clinically doing well.  No active bleeding.  Plan: -Clear liquid diet.  Advance as tolerated. -Continue IV Protonix.  Once able to tolerate p.o., change to 40 mg p.o. twice daily. -No plans to do EGD.  It could be more detrimental.  Would recommend EGD in 8 to 12 weeks as an outpatient. -Avoid nonsteroidals. -Trend CBC.  Transfuse if Hb <8. -Ambulate -We will sign off for now.           [name] is a 78 y.o. female  Doing much better today. Upper GI series negative for any perforation.  Did show gastric ulcers. No hematemesis. NG tube has been removed.   Past Medical History:  Diagnosis Date   Anxiety    Arthritis    Asthma    hx of   Back pain    Chronic constipation    Depression    hx of   Dyspnea    Family history of adverse reaction to anesthesia    DAD bp ELEVATED    GERD (gastroesophageal reflux disease)    High cholesterol    Hypertension    Lymphoma ([facility])    Obesity    Sleep apnea    uses a CPAP   Stroke ([facility])    2010   SVT (supraventricular tachycardia) ([facility])     Current Facility-Administered Medications  Medication Dose Route Frequency Provider Last Rate Last Dose   0.9 %  sodium chloride infusion   Intravenous Continuous Regalado, Belkys A, MD 20 mL/hr at 04/01/19 1428     acetaminophen (TYLENOL) tablet 650 mg  650 mg Oral Q6H PRN Fuller Plan A, MD       Or   acetaminophen (TYLENOL) suppository 650 mg  650 mg Rectal Q6H PRN Fuller Plan A, MD   650 mg at 03/28/19 2214   albuterol (PROVENTIL) (2.5 MG/3ML) 0.083% nebulizer solution 2.5 mg  2.5 mg Nebulization Q3H PRN Marijean Heath, NP       Chlorhexidine Gluconate Cloth 2 % PADS 6 each  6 each Topical Daily Audria Nine, DO   6 each at 04/02/19 0934   fluticasone furoate-vilanterol (BREO ELLIPTA) 100-25 MCG/INH 1 puff  1 puff Inhalation Daily Audria Nine, DO   1 puff at 04/02/19 0850   furosemide (LASIX) injection 20 mg  20 mg Intravenous Once Regalado, Belkys A, MD       levalbuterol (XOPENEX) nebulizer solution 0.63 mg  0.63 mg Nebulization TID Regalado, Belkys A, MD   0.63 mg at 04/02/19 0848   morphine 2 MG/ML injection 2 mg  2 mg Intravenous Q1H PRN Rush Farmer, MD   2 mg at 03/30/19 1926   ondansetron (ZOFRAN) injection 4 mg  4 mg Intravenous Once Fuller Plan A, MD   Stopped at 03/28/19 0547   ondansetron (ZOFRAN) tablet 4 mg  4 mg Oral Q6H PRN Fuller Plan A, MD       Or   ondansetron (ZOFRAN) injection 4 mg  4 mg Intravenous Q6H PRN Fuller Plan A, MD   4 mg at 03/29/19 0740   pantoprazole (PROTONIX) injection 40 mg  40 mg Intravenous Q12H Smith, Rondell A, MD   40 mg at 04/02/19 0917   piperacillin-tazobactam (ZOSYN) IVPB 3.375 g  3.375 g Intravenous Q8H Rumbarger, Rachel L, RPH 12.5 mL/hr at 04/02/19 0921 3.375 g at 04/02/19 0921   potassium  chloride 10 mEq in 100 mL IVPB  10 mEq Intravenous Q1 Hr x 4 Regalado, Belkys A, MD 100 mL/hr at 04/02/19 0926 10 mEq at 04/02/19 [id number]   sodium chloride flush (NS) 0.9 % injection 10-40 mL  10-40 mL Intracatheter PRN Rush Farmer, MD       sodium chloride flush (NS) 0.9 % injection 3 mL  3 mL Intravenous Q12H Smith, Rondell A, MD   3 mL at 04/01/19 0914   umeclidinium bromide (INCRUSE ELLIPTA) 62.5 MCG/INH 1 puff  1 puff Inhalation Daily Audria Nine, DO   1 puff at 04/02/19 [id number]    Past Surgical History:  Procedure Laterality Date   BACK SURGERY  2009   for lymphoma   BONE BIOPSY     CESAREAN SECTION     ESOPHAGOGASTRODUODENOSCOPY     ESOPHAGOGASTRODUODENOSCOPY (EGD) WITH PROPOFOL N/A 08/05/2017   Procedure: ESOPHAGOGASTRODUODENOSCOPY (EGD) WITH PROPOFOL;  Surgeon: Doran Stabler, MD;  Location: [facility];  Service: Gastroenterology;  Laterality: N/A;   EYE SURGERY     cataracts   LAPAROSCOPY N/A 02/01/2017   Procedure: LAPAROSCOPY  DIAGNOSTIC;  Surgeon: Erroll Luna, MD;  Location: [facility];  Service: General;  Laterality: N/A;   REPLACEMENT TOTAL KNEE     right   TOTAL ABDOMINAL HYSTERECTOMY  1973    Family History  Problem Relation Age of Onset   Lymphoma Mother    Breast cancer Mother    Coronary artery disease Father 67       died   Stroke Father    Colon cancer Maternal Uncle     Social History   Tobacco Use   Smoking status: Never Smoker   Smokeless tobacco: Never Used  Substance Use Topics   Alcohol use: No   Drug use: No    No Known Allergies   Review of Systems: All systems reviewed and negative except where noted in HPI.    Physical Exam:     BP (!) 126/54    Pulse 83    Temp 98.3 F (36.8 C) (Oral)    Resp (!) 21    Ht 5\' 1"  (1.549 m)    Wt (!) 139.6 kg    SpO2 99%    BMI 58.15 kg/m  Wt Readings from Last 3 Encounters:  04/02/19 (!) 139.6 kg  04/27/18 124.3 kg  08/05/17 122.5 kg   GENERAL:  Alert, oriented, cooperative, not in acute distress. PSYCH: :Pleasant, normal mood and affect. HEENT:  conjunctiva pink, mucous membranes moist, neck supple without masses. No jaundice. CARDIAC:  S1 S2 normal. No murmers. PULM: Normal respiratory effort, lungs CTA bilaterally, no wheezing. ABDOMEN: Soft minimal epigastric tenderness bowel sounds are present. Rectal exam: Deferred SKIN:  turgor, no lesions seen. Musculoskeletal:  Normal muscle tone, normal strength. NEURO: Alert and oriented x 3, no focal neurologic deficits.   Data Reviewed: I have personally reviewed following labs and imaging studies  CBC: Recent Labs  Lab 03/31/19 0302 03/31/19 1842 04/01/19 0438 04/02/19 0434  WBC 9.8  --  8.3 8.3  HGB 7.4* 8.2* 8.3* 8.3*  HCT 24.4* 26.8* 26.7* 26.5*  MCV 96.1  --  89.9 89.5  PLT 322  --  317 [id number]   Basic Metabolic Panel: Recent Labs  Lab 03/29/19 0358 03/30/19 0611 03/31/19 0302 04/01/19 0438 04/02/19 0434  NA 137 138 138 138 139  K 4.1 4.0 3.9 3.8 3.1*   CL 106 107 106 103 101  CO2 25 24 23 24  27  GLUCOSE 112* 100* 95 94 89  BUN 35* 21 17 16 13   CREATININE 0.88 0.81 0.77 0.76 0.82  CALCIUM 7.4* 7.8* 7.9* 8.4* 8.3*  MG 1.8 1.7 1.9  --   --   PHOS 3.6 3.0  --   --   --    GFR: Estimated Creatinine Clearance: 74.2 mL/min (by C-G formula based on SCr of 0.82 mg/dL). Liver Function Tests: Recent Labs  Lab 03/28/19 0524 03/31/19 0302  AST 39 10*  ALT 27 12  ALKPHOS 94 48  BILITOT 2.0* 1.1  PROT 6.8 5.1*  ALBUMIN 3.3* 1.9*   Recent Labs  Lab 03/28/19 0524  LIPASE 28   No results for input(s): AMMONIA in the last 168 hours. Coagulation Profile: No results for input(s): INR, PROTIME in the last 168 hours. HbA1C: No results for input(s): HGBA1C in the last 72 hours. Lipid Profile: No results for input(s): CHOL, HDL, LDLCALC, TRIG, CHOLHDL, LDLDIRECT in the last 72 hours. Thyroid Function Tests: No results for input(s): TSH, T4TOTAL, FREET4, T3FREE, THYROIDAB in the last 72 hours. Anemia Panel: No results for input(s): VITAMINB12, FOLATE, FERRITIN, TIBC, IRON, RETICCTPCT in the last 72 hours.  Recent Results (from the past 240 hour(s))  SARS CORONAVIRUS 2 (TAT 6-12 HRS) Nasal Swab Aptima Multi Swab     Status: None   Collection Time: 03/28/19  8:41 AM   Specimen: Aptima Multi Swab; Nasal Swab  Result Value Ref Range Status   SARS Coronavirus 2 NEGATIVE NEGATIVE Final    Comment: (NOTE) SARS-CoV-2 target nucleic acids are NOT DETECTED. The SARS-CoV-2 RNA is generally detectable in upper and lower respiratory specimens during the acute phase of infection. Negative results do not preclude SARS-CoV-2 infection, do not rule out co-infections with other pathogens, and should not be used as the sole basis for treatment or other patient management decisions. Negative results must be combined with clinical observations, patient history, and epidemiological information. The expected result is Negative. Fact Sheet for  Patients: SugarRoll.be Fact Sheet for Healthcare Providers: https://www.woods-mathews.com/ This test is not yet approved or cleared by the [place] FDA and  has been authorized for detection and/or diagnosis of SARS-CoV-2 by FDA under an Emergency Use Authorization (EUA). This EUA will remain  in effect (meaning this test can be used) for the duration of the COVID-19 declaration under Section 56 4(b)(1) of the Act, 21 U.S.C. section 360bbb-3(b)(1), unless the authorization is terminated or revoked sooner. Performed at [facility], [place] [address] [place], [place] [address]   Urine culture     Status: Abnormal   Collection Time: 03/28/19  8:58 AM   Specimen: Urine, Random  Result Value Ref Range Status   Specimen Description URINE, RANDOM  Final   Special Requests   Final    NONE Performed at [facility], [place] [address], [place], [place] [address]    Culture >=100,000 COLONIES/mL ESCHERICHIA COLI (A)  Final   Report Status 03/30/2019 FINAL  Final   Organism ID, Bacteria ESCHERICHIA COLI (A)  Final      Susceptibility   Escherichia coli - MIC*    AMPICILLIN <=2 SENSITIVE Sensitive     CEFAZOLIN <=4 SENSITIVE Sensitive     CEFTRIAXONE <=1 SENSITIVE Sensitive     CIPROFLOXACIN <=0.25 SENSITIVE Sensitive     GENTAMICIN <=1 SENSITIVE Sensitive     IMIPENEM <=0.25 SENSITIVE Sensitive     NITROFURANTOIN 32 SENSITIVE Sensitive     TRIMETH/SULFA <=20 SENSITIVE Sensitive  AMPICILLIN/SULBACTAM <=2 SENSITIVE Sensitive     PIP/TAZO <=4 SENSITIVE Sensitive     Extended ESBL NEGATIVE Sensitive     * >=100,000 COLONIES/mL ESCHERICHIA COLI  Culture, blood (routine x 2)     Status: None   Collection Time: 03/28/19  2:40 PM   Specimen: BLOOD  Result Value Ref Range Status   Specimen Description BLOOD RIGHT ANTECUBITAL  Final   Special Requests   Final    BOTTLES DRAWN AEROBIC ONLY Blood Culture adequate volume   Culture   Final     NO GROWTH 5 DAYS Performed at [facility], [address] [place], [place] [address]    Report Status 04/02/2019 FINAL  Final  Culture, blood (routine x 2)     Status: None   Collection Time: 03/28/19  2:46 PM   Specimen: BLOOD RIGHT HAND  Result Value Ref Range Status   Specimen Description BLOOD RIGHT HAND  Final   Special Requests   Final    BOTTLES DRAWN AEROBIC ONLY Blood Culture adequate volume   Culture   Final    NO GROWTH 5 DAYS Performed at [facility], [place] [address], [place], [place] [address]    Report Status 04/02/2019 FINAL  Final  MRSA PCR Screening     Status: None   Collection Time: 03/30/19  9:37 AM   Specimen: Nasopharyngeal  Result Value Ref Range Status   MRSA by PCR NEGATIVE NEGATIVE Final    Comment:        The GeneXpert MRSA Assay (FDA approved for NASAL specimens only), is one component of a comprehensive MRSA colonization surveillance program. It is not intended to diagnose MRSA infection nor to guide or monitor treatment for MRSA infections. Performed at [facility], [place] [address], [place], [place] [address]       Radiology Studies: Dg Abd 1 View  Result Date: 03/29/2019 CLINICAL DATA:  Perforation of stomach after ingestion of foreign material. NG tube placement. EXAM: ABDOMEN - 1 VIEW COMPARISON:  CT 03/28/2019. FINDINGS: NG tube noted coiled in the left upper quadrant most likely in the fundus of the stomach. No gastric distention. No bowel distention. No free air identified. Prior thoracolumbar spine fusion. Prior thoracic and lumbar vertebroplasties. IMPRESSION: NG tube noted coiled of the fundus of the stomach. No gastric distention. Electronically Signed   By: Marcello Moores  Register   On: 03/29/2019 11:46   Ct Abdomen Pelvis W Contrast  Addendum Date: 03/28/2019   ADDENDUM REPORT: 03/28/2019 08:32 ADDENDUM: Study discussed by telephone with Dr. Wilson Singer in the ED on 03/28/2019 at 0822 hours. Electronically Signed   By: Genevie Ann M.D.   On: 03/28/2019 08:32   Result Date: 03/28/2019 CLINICAL DATA:  78 year old female with abdominal pain, nausea and vomiting since last night. EXAM: CT ABDOMEN AND PELVIS WITH CONTRAST TECHNIQUE: Multidetector CT imaging of the abdomen and pelvis was performed using the standard protocol following bolus administration of intravenous contrast. CONTRAST:  137mL ISOVUE-300 IOPAMIDOL (ISOVUE-300) INJECTION 61% COMPARISON:  CT Abdomen and Pelvis 01/31/2017. FINDINGS: Lower chest: Lung bases have not significantly changed with chronic lower lobe atelectasis or scarring. There is superimposed mosaic attenuation today, probably gas trapping. Stable mild cardiomegaly. No pericardial or pleural effusion. Hepatobiliary: 19 millimeter gallstones. No pericholecystic inflammation. Normal liver enhancement. No bile duct enlargement. Pancreas: Negative. Spleen: Negative. Adrenals/Urinary Tract: Negative adrenal glands. Stable kidneys. Bilateral renal enhancement and contrast excretion is symmetric and normal. Small left renal parapelvic cysts. Unremarkable  ureter. Stomach/Bowel: Negative rectum aside from retained stool. Small chronic calcified perirectal lymph node is unchanged. Mild diverticulosis in the sigmoid colon, no active inflammation. Negative descending colon. Redundant but otherwise negative transverse colon. Negative right colon. Diminutive appendix redemonstrated. Negative terminal ileum. No dilated small bowel. There is confluent inflammatory stranding along the distal stomach with trace pneumoperitoneum in the adjacent mesentery on series 4, image 40. This is a similar appearance to that in 2018. The proximal duodenum is relatively spared. There is no discrete gastric lesion. No other free air. Small phlegmon like nodularity in the adjacent mesentery up to 2 centimeters. Retained fluid in the proximal stomach. No free fluid. Vascular/Lymphatic: Aortoiliac calcified atherosclerosis. Major arterial  structures are patent. Portal venous system is patent. Reproductive: Surgically absent uterus. Diminutive or absent ovaries. Other: No pelvic free fluid. Musculoskeletal: Widespread thoracolumbar compression fractures and multilevel prior vertebral augmentation. Chronic posterior spinal rods. Osteopenia. Degenerative changes also at the hips and pubic symphysis. No acute osseous abnormality identified. IMPRESSION: 1. Inflammation and trace pneumoperitoneum along the distal stomach, similar to the abnormal CT in 2018, compatible with contained Perforated Peptic Ulcer. Today the duodenum appears relatively spared. No discrete gastric lesion. No free fluid. 2. No other acute findings. Chronic findings include: Lung base scarring, cholelithiasis, osteopenia with spinal compression fractures, aortic Atherosclerosis (ICD10-I70.0). Electronically Signed: By: Genevie Ann M.D. On: 03/28/2019 08:14   Dg Chest Port 1 View  Result Date: 04/01/2019 CLINICAL DATA:  Shortness of breath EXAM: PORTABLE CHEST 1 VIEW COMPARISON:  03/31/2019 FINDINGS: An NG tube is noted entering the stomach with tip off field of view. Slightly improved LEFT basilar aeration with continued atelectasis/airspace disease. No pneumothorax or definite pleural effusion. Spinal surgical hardware and evidence of thoracic vertebral augmentation again noted. IMPRESSION: Improved LEFT basilar aeration without other significant change. Electronically Signed   By: Margarette Canada M.D.   On: 04/01/2019 14:17   Dg Chest Port 1 View  Result Date: 03/31/2019 CLINICAL DATA:  Respiratory failure EXAM: PORTABLE CHEST 1 VIEW COMPARISON:  02/08/2017 FINDINGS: Nasogastric tube terminates at the body of the stomach. Thoracic spine fixation. Midline trachea. Normal heart size. Small left pleural effusion, similar. No pneumothorax. Low lung volumes. Left base airspace disease. IMPRESSION: Small left pleural effusion with left base atelectasis versus infection. Electronically  Signed   By: Abigail Miyamoto M.D.   On: 03/31/2019 06:47   Dg Duanne Limerick W Single Cm (sol Or Thin Ba)  Result Date: 04/02/2019 CLINICAL DATA:  Perforated gastric ulcer. EXAM: UPPER GI SERIES WITH KUB TECHNIQUE: After obtaining a scout radiograph a routine upper GI series was performed using 150 cc Omnipaque 3 Hunt FLUOROSCOPY TIME:  Fluoroscopy Time:  2 minutes, 18 seconds Radiation Exposure Index (if provided by the fluoroscopic device): 14.8 mGy Number of Acquired Spot Images: 0 COMPARISON:  CT scan 03/28/2019 FINDINGS: Initial KUB demonstrates gallstones in the right upper quadrant along with thoracolumbar spinal fixation hardware, prior lower thoracic vertebral augmentations, mild degenerative arthropathy of the hips, left greater than right. Unremarkable bowel gas pattern. Nasogastric tube is present in the stomach. The contrast medium was injected by nasogastric tube. The patient has limited mobility, but we turned tear from the supine into the right posterior oblique position in order to cause the contrast flow into the gastric antrum and duodenum. A balloon paddle was used for compression in order to better delineate the structures. There is abnormal fold thickening in the gastric antrum both along the superior and inferior margin as shown for  example on image 7/10, with suspected ulcerations both superiorly and inferiorly. We did not demonstrate compelling evidence of extension of contrast from the stomach lumen. Normal duodenal morphology. The esophagus was not investigated. The pharyngeal phase of swallowing was not assessed. IMPRESSION: 1. Ulceration along the superior and inferior margins of the gastric antrum with associated fold thickening. 2. No leak of water soluble contrast medium from the stomach was demonstrated on today's exam. I used gentle compression with the balloon paddle. 3. Contrast was injected through the NG tube. The esophagus and pharyngeal phase of swallowing were not assessed. 4.  Cholelithiasis. Electronically Signed   By: Van Clines M.D.   On: 04/02/2019 08:49      Evanthia Maund,MD 04/02/2019, 10:56 AM   CC No ref. provider found

## 2019-04-02 NOTE — Progress Notes (Signed)
Subjective/Chief Complaint: Comfortable Just returned from UGI   Objective: Vital signs in last 24 hours: Temp:  [98.2 F (36.8 C)-98.5 F (36.9 C)] 98.3 F (36.8 C) (08/31 0409) Pulse Rate:  [83-94] 83 (08/31 0409) Resp:  [15-27] 21 (08/31 0409) BP: (107-141)/(43-92) 126/54 (08/31 0409) SpO2:  [96 %-99 %] 99 % (08/31 0848) Weight:  [139.6 kg] 139.6 kg (08/31 0500) Last BM Date: 03/29/19  Intake/Output from previous day: 08/30 0701 - 08/31 0700 In: 797.5 [I.V.:547.7; IV Piggyback:249.8] Out: [id number] [Urine:3550] Intake/Output this shift: No intake/output data recorded.  UO:[id number] but easily awakened, pleasant Card:RRR Pulm: Normal effort, clear to auscultation bilaterally Abd: Soft,TTP inepigastrium only today, no guarding, no peritonitis, non-distended, +BS Skin: warm and dry, no rashes  Psych: A&Ox3  Lab Results:  Recent Labs    04/01/19 0438 04/02/19 0434  WBC 8.3 8.3  HGB 8.3* 8.3*  HCT 26.7* 26.5*  PLT 317 356   BMET Recent Labs    04/01/19 0438 04/02/19 0434  NA 138 139  K 3.8 3.1*  CL 103 101  CO2 24 27  GLUCOSE 94 89  BUN 16 13  CREATININE 0.76 0.82  CALCIUM 8.4* 8.3*   PT/INR No results for input(s): LABPROT, INR in the last 72 hours. ABG No results for input(s): PHART, HCO3 in the last 72 hours.  Invalid input(s): PCO2, PO2  Studies/Results: Dg Chest Port 1 View  Result Date: 04/01/2019 CLINICAL DATA:  Shortness of breath EXAM: PORTABLE CHEST 1 VIEW COMPARISON:  03/31/2019 FINDINGS: An NG tube is noted entering the stomach with tip off field of view. Slightly improved LEFT basilar aeration with continued atelectasis/airspace disease. No pneumothorax or definite pleural effusion. Spinal surgical hardware and evidence of thoracic vertebral augmentation again noted. IMPRESSION: Improved LEFT basilar aeration without other significant change. Electronically Signed   By: Margarette Canada M.D.   On: 04/01/2019 14:17   Dg Duanne Limerick W Single Cm  (sol Or Thin Ba)  Result Date: 04/02/2019 CLINICAL DATA:  Perforated gastric ulcer. EXAM: UPPER GI SERIES WITH KUB TECHNIQUE: After obtaining a scout radiograph a routine upper GI series was performed using 150 cc Omnipaque 3 Hunt FLUOROSCOPY TIME:  Fluoroscopy Time:  2 minutes, 18 seconds Radiation Exposure Index (if provided by the fluoroscopic device): 14.8 mGy Number of Acquired Spot Images: 0 COMPARISON:  CT scan 03/28/2019 FINDINGS: Initial KUB demonstrates gallstones in the right upper quadrant along with thoracolumbar spinal fixation hardware, prior lower thoracic vertebral augmentations, mild degenerative arthropathy of the hips, left greater than right. Unremarkable bowel gas pattern. Nasogastric tube is present in the stomach. The contrast medium was injected by nasogastric tube. The patient has limited mobility, but we turned tear from the supine into the right posterior oblique position in order to cause the contrast flow into the gastric antrum and duodenum. A balloon paddle was used for compression in order to better delineate the structures. There is abnormal fold thickening in the gastric antrum both along the superior and inferior margin as shown for example on image 7/10, with suspected ulcerations both superiorly and inferiorly. We did not demonstrate compelling evidence of extension of contrast from the stomach lumen. Normal duodenal morphology. The esophagus was not investigated. The pharyngeal phase of swallowing was not assessed. IMPRESSION: 1. Ulceration along the superior and inferior margins of the gastric antrum with associated fold thickening. 2. No leak of water soluble contrast medium from the stomach was demonstrated on today's exam. I used gentle compression with the balloon paddle. 3. Contrast  was injected through the NG tube. The esophagus and pharyngeal phase of swallowing were not assessed. 4. Cholelithiasis. Electronically Signed   By: Van Clines M.D.   On: 04/02/2019  08:49    Anti-infectives: Anti-infectives (From admission, onward)   Start     Dose/Rate Route Frequency Ordered Stop   03/28/19 1500  piperacillin-tazobactam (ZOSYN) IVPB 3.375 g     3.375 g 12.5 mL/hr over 240 Minutes Intravenous Every 8 hours 03/28/19 1004     03/28/19 1400  piperacillin-tazobactam (ZOSYN) IVPB 3.375 g  Status:  Discontinued     3.375 g 100 mL/hr over 30 Minutes Intravenous Every 8 hours 03/28/19 1001 03/28/19 1003   03/28/19 0845  piperacillin-tazobactam (ZOSYN) IVPB 3.375 g     3.375 g 100 mL/hr over 30 Minutes Intravenous  Once 03/28/19 0840 03/28/19 1002      Assessment/Plan: Asthma H/O CVA H/O lymphoma HTN H/O contained gastric perforation in 2018  Contained gastric ulcer perforation with hematemesis -patient appears to have a contained gastric ulcer perforation at the same site as previously noted in 2018. - hgb8.3, VSS, s/p 1 unit PRBC 8/27 - continue PPI gtt - patient TTP overepigastriumbut no peritonitis, no indication for emergent surgical intervention -NGT clamped and exam is stable -  D/C NG tube  FEN -Clear liquids today since UGI negative/ IVFs/protonix BID VTE -would hold given hematemesis ID -zosyn 8/26>>   LOS: 5 days    Maia Petties 04/02/2019

## 2019-04-02 NOTE — Progress Notes (Signed)
NG tube removed. Pt tolerated well. Ordered meal tray. Will continue to monitor.

## 2019-04-02 NOTE — Progress Notes (Signed)
Occupational Therapy Treatment Patient Details Name: SAMIRRA MOLINAR MRN: [id number] DOB: 04/06/1941 Today's Date: 04/02/2019    History of present illness 78yo female admitted 8/26 with hypotension in the setting of probable contained perforated peptic ulcer    OT comments  This 78 yo female admitted with above presents to acute OT tolerating getting up to EOB, sitting EOB, sit>stand x2 at EOB. She will continue to benefit from acute OT with follow up HHOT and 24 hour S/A prn (especially when up on feet)   Follow Up Recommendations  Home health OT;Supervision/Assistance - 24 hour    Equipment Recommendations  None recommended by OT       Precautions / Restrictions Precautions Precautions: Fall Restrictions Weight Bearing Restrictions: No       Mobility Bed Mobility Overal bed mobility: Needs Assistance Bed Mobility: Rolling Rolling: (S to left, min A to right)   Supine to sit: Min assist     General bed mobility comments: increased effort to scoot towards HOB  Transfers Overall transfer level: Needs assistance Equipment used: Rolling walker (2 wheeled) Transfers: Sit to/from Stand Sit to Stand: Min assist;+2 safety/equipment         General transfer comment: Pt able to stand long enough to get back peri area cleaned due to smearing on pad    Balance Overall balance assessment: Needs assistance Sitting-balance support: No upper extremity supported;Feet supported Sitting balance-Leahy Scale: Fair     Standing balance support: Bilateral upper extremity supported Standing balance-Leahy Scale: Poor Standing balance comment: reliant on RW                           ADL either performed or assessed with clinical judgement   ADL Overall ADL's : Needs assistance/impaired                         Toilet Transfer: Minimal assistance;+2 for physical assistance Toilet Transfer Details (indicate cue type and reason): lateral step up towards  [place] and Hygiene: Total assistance Toileting - Clothing Manipulation Details (indicate cue type and reason): peri-care             Vision Patient Visual Report: No change from baseline            Cognition Arousal/Alertness: Awake/alert Behavior During Therapy: WFL for tasks assessed/performed Overall Cognitive Status: Within Functional Limits for tasks assessed                                                     Pertinent Vitals/ Pain       Pain Assessment: Faces Faces Pain Scale: Hurts little more Pain Location: related to general effort with transfers Pain Descriptors / Indicators: Moaning(as scooted towards HOB) Pain Intervention(s): Limited activity within patient's tolerance;Monitored during session;Repositioned         Frequency  Min 3X/week        Progress Toward Goals  OT Goals(current goals can now be found in the care plan section)  Progress towards OT goals: Progressing toward goals     Plan Discharge plan remains appropriate    Co-evaluation    PT/OT/SLP Co-Evaluation/Treatment: Yes Reason for Co-Treatment: For patient/therapist safety PT goals addressed during session: Mobility/safety with mobility;Strengthening/ROM OT goals addressed during session: Strengthening/ROM  AM-PAC OT "6 Clicks" Daily Activity     Outcome Measure   Help from another person eating meals?: A Little Help from another person taking care of personal grooming?: A Little Help from another person toileting, which includes using toliet, bedpan, or urinal?: A Lot Help from another person bathing (including washing, rinsing, drying)?: A Lot Help from another person to put on and taking off regular upper body clothing?: A Little Help from another person to put on and taking off regular lower body clothing?: A Lot 6 Click Score: 15    End of Session Equipment Utilized During Treatment: Oxygen;Gait belt;Rolling  walker(3 liters)  OT Visit Diagnosis: Unsteadiness on feet (R26.81);Other abnormalities of gait and mobility (R26.89);Muscle weakness (generalized) (M62.81)   Activity Tolerance Patient tolerated treatment well   Patient Left in bed;with call bell/phone within reach;with bed alarm set;with family/visitor present   Nurse Communication          Time: [id number] OT Time Calculation (min): 27 min  Charges: OT General Charges $OT Visit: 1 Visit OT Treatments $Self Care/Home Management : 8-22 mins  [place], OTR/L Acute [employer] Pager [phone] Office [phone]      Almon Register 04/02/2019, 3:13 PM

## 2019-04-03 LAB — BASIC METABOLIC PANEL
Anion gap: 12 (ref 5–15)
BUN: 6 mg/dL — ABNORMAL LOW (ref 8–23)
CO2: 30 mmol/L (ref 22–32)
Calcium: 8.5 mg/dL — ABNORMAL LOW (ref 8.9–10.3)
Chloride: 95 mmol/L — ABNORMAL LOW (ref 98–111)
Creatinine, Ser: 0.85 mg/dL (ref 0.44–1.00)
GFR calc Af Amer: >60 mL/min (ref >60)
GFR calc non Af Amer: >60 mL/min (ref >60)
Glucose, Bld: 167 mg/dL — ABNORMAL HIGH (ref 70–99)
Potassium: 2.9 mmol/L — ABNORMAL LOW (ref 3.5–5.1)
Sodium: 137 mmol/L (ref 135–145)

## 2019-04-03 LAB — CBC
HCT: 31 % — ABNORMAL LOW (ref 36.0–46.0)
Hemoglobin: 9.4 g/dL — ABNORMAL LOW (ref 12.0–15.0)
MCH: 27.5 pg (ref 26.0–34.0)
MCHC: 30.3 g/dL (ref 30.0–36.0)
MCV: 90.6 fL (ref 80.0–100.0)
Platelets: 463 10*3/uL — ABNORMAL HIGH (ref 150–400)
RBC: 3.42 MIL/uL — ABNORMAL LOW (ref 3.87–5.11)
RDW: 17.5 % — ABNORMAL HIGH (ref 11.5–15.5)
WBC: 8.5 10*3/uL (ref 4.0–10.5)
nRBC: 0 % (ref 0.0–0.2)

## 2019-04-03 LAB — GLUCOSE, CAPILLARY
Glucose-Capillary: 126 mg/dL — ABNORMAL HIGH (ref 70–99)
Glucose-Capillary: 123 mg/dL — ABNORMAL HIGH (ref 70–99)
Glucose-Capillary: 113 mg/dL — ABNORMAL HIGH (ref 70–99)
Glucose-Capillary: 144 mg/dL — ABNORMAL HIGH (ref 70–99)
Glucose-Capillary: 117 mg/dL — ABNORMAL HIGH (ref 70–99)
Glucose-Capillary: 125 mg/dL — ABNORMAL HIGH (ref 70–99)

## 2019-04-03 LAB — MAGNESIUM: Magnesium: 1.7 mg/dL (ref 1.7–2.4)

## 2019-04-03 MED ORDER — POTASSIUM CHLORIDE CRYS ER 20 MEQ PO TBCR
40.0000 meq | EXTENDED_RELEASE_TABLET | Freq: Two times a day (BID) | ORAL | Status: AC
  Administered 2019-04-03 (×2): 40 meq via ORAL
  Filled 2019-04-03 (×2): qty 2

## 2019-04-03 MED ORDER — LEVALBUTEROL HCL 0.63 MG/3ML IN NEBU
0.6300 mg | INHALATION_SOLUTION | Freq: Four times a day (QID) | RESPIRATORY_TRACT | Status: DC
  Administered 2019-04-03 (×2): 0.63 mg via RESPIRATORY_TRACT
  Filled 2019-04-03 (×2): qty 3

## 2019-04-03 MED ORDER — MAGNESIUM SULFATE 2 GM/50ML IV SOLN
2.0000 g | Freq: Once | INTRAVENOUS | Status: AC
  Administered 2019-04-03: 2 g via INTRAVENOUS
  Filled 2019-04-03: qty 50

## 2019-04-03 MED ORDER — AMOXICILLIN-POT CLAVULANATE 875-125 MG PO TABS
1.0000 | ORAL_TABLET | Freq: Two times a day (BID) | ORAL | Status: DC
  Administered 2019-04-03 – 2019-04-04 (×3): 1 via ORAL
  Filled 2019-04-03 (×3): qty 1

## 2019-04-03 MED ORDER — FUROSEMIDE 20 MG PO TABS
20.0000 mg | ORAL_TABLET | Freq: Once | ORAL | Status: AC
  Administered 2019-04-03: 20 mg via ORAL
  Filled 2019-04-03: qty 1

## 2019-04-03 MED ORDER — LEVALBUTEROL HCL 0.63 MG/3ML IN NEBU: 0.6300 mg | INHALATION_SOLUTION | Freq: Two times a day (BID) | RESPIRATORY_TRACT | Status: DC

## 2019-04-03 NOTE — Progress Notes (Signed)
Subjective/Chief Complaint: No complaints except some diarrhea yesterday. No abdominal pain - tolerating clear liquids   Objective: Vital signs in last 24 hours: Temp:  [97.8 F (36.6 C)-99.3 F (37.4 C)] 97.8 F (36.6 C) (09/01 0747) Pulse Rate:  [75-92] 75 (09/01 0314) Resp:  [18-27] 18 (09/01 0314) BP: (116-144)/(41-68) 121/63 (09/01 0314) SpO2:  [90 %-99 %] 97 % (09/01 0314) Weight:  [131.6 kg] 131.6 kg (09/01 0314) Last BM Date: 04/02/19  Intake/Output from previous day: 08/31 0701 - 09/01 0700 In: 320.2 [I.V.:50; IV Piggyback:270.2] Out: 5100 [Urine:5100] Intake/Output this shift: No intake/output data recorded.  WDWN in NAD Wearing CPAP - just woke up Abd - soft, non-tender; no peritonitis  Lab Results:  Recent Labs    04/01/19 0438 04/02/19 0434  WBC 8.3 8.3  HGB 8.3* 8.3*  HCT 26.7* 26.5*  PLT 317 356   BMET Recent Labs    04/01/19 0438 04/02/19 0434  NA 138 139  K 3.8 3.1*  CL 103 101  CO2 24 27  GLUCOSE 94 89  BUN 16 13  CREATININE 0.76 0.82  CALCIUM 8.4* 8.3*   PT/INR No results for input(s): LABPROT, INR in the last 72 hours. ABG No results for input(s): PHART, HCO3 in the last 72 hours.  Invalid input(s): PCO2, PO2  Studies/Results: Dg Chest Port 1 View  Result Date: 04/01/2019 CLINICAL DATA:  Shortness of breath EXAM: PORTABLE CHEST 1 VIEW COMPARISON:  03/31/2019 FINDINGS: An NG tube is noted entering the stomach with tip off field of view. Slightly improved LEFT basilar aeration with continued atelectasis/airspace disease. No pneumothorax or definite pleural effusion. Spinal surgical hardware and evidence of thoracic vertebral augmentation again noted. IMPRESSION: Improved LEFT basilar aeration without other significant change. Electronically Signed   By: Margarette Canada M.D.   On: 04/01/2019 14:17   Dg Duanne [place] W Single Cm (sol Or Thin Ba)  Result Date: 04/02/2019 CLINICAL DATA:  Perforated gastric ulcer. EXAM: UPPER GI SERIES WITH KUB  TECHNIQUE: After obtaining a scout radiograph a routine upper GI series was performed using 150 cc Omnipaque 3 Hunt FLUOROSCOPY TIME:  Fluoroscopy Time:  2 minutes, 18 seconds Radiation Exposure Index (if provided by the fluoroscopic device): 14.8 mGy Number of Acquired Spot Images: 0 COMPARISON:  CT scan 03/28/2019 FINDINGS: Initial KUB demonstrates gallstones in the right upper quadrant along with thoracolumbar spinal fixation hardware, prior lower thoracic vertebral augmentations, mild degenerative arthropathy of the hips, left greater than right. Unremarkable bowel gas pattern. Nasogastric tube is present in the stomach. The contrast medium was injected by nasogastric tube. The patient has limited mobility, but we turned tear from the supine into the right posterior oblique position in order to cause the contrast flow into the gastric antrum and duodenum. A balloon paddle was used for compression in order to better delineate the structures. There is abnormal fold thickening in the gastric antrum both along the superior and inferior margin as shown for example on image 7/10, with suspected ulcerations both superiorly and inferiorly. We did not demonstrate compelling evidence of extension of contrast from the stomach lumen. Normal duodenal morphology. The esophagus was not investigated. The pharyngeal phase of swallowing was not assessed. IMPRESSION: 1. Ulceration along the superior and inferior margins of the gastric antrum with associated fold thickening. 2. No leak of water soluble contrast medium from the stomach was demonstrated on today's exam. I used gentle compression with the balloon paddle. 3. Contrast was injected through the NG tube. The esophagus and pharyngeal  phase of swallowing were not assessed. 4. Cholelithiasis. Electronically Signed   By: Van Clines M.D.   On: 04/02/2019 08:49    Anti-infectives: Anti-infectives (From admission, onward)   Start     Dose/Rate Route Frequency Ordered  Stop   04/03/19 1000  amoxicillin-clavulanate (AUGMENTIN) 875-125 MG per tablet 1 tablet     1 tablet Oral Every 12 hours 04/03/19 0754     03/28/19 1500  piperacillin-tazobactam (ZOSYN) IVPB 3.375 g  Status:  Discontinued     3.375 g 12.5 mL/hr over 240 Minutes Intravenous Every 8 hours 03/28/19 1004 04/03/19 0754   03/28/19 1400  piperacillin-tazobactam (ZOSYN) IVPB 3.375 g  Status:  Discontinued     3.375 g 100 mL/hr over 30 Minutes Intravenous Every 8 hours 03/28/19 1001 03/28/19 1003   03/28/19 0845  piperacillin-tazobactam (ZOSYN) IVPB 3.375 g     3.375 g 100 mL/hr over 30 Minutes Intravenous  Once 03/28/19 0840 03/28/19 1002      Assessment/Plan: Asthma H/O CVA H/O lymphoma HTN H/O contained gastric perforation in 2018  Contained gastric ulcer perforation with hematemesis -patient appears to have a contained gastric ulcer perforation at the same site as previously noted in 2018. - hgb8.3, VSS, s/p 1 unit PRBC 8/27 - continue PPI - patient non-tender on exam   FEN -Full liquids, advance as tolerated VTE -may restart Lovenox ID -zosyn 8/26>> switch to PO Augmentin Possible discharge later today or tomorrow   LOS: 6 days    Maia Petties 04/03/2019

## 2019-04-03 NOTE — Care Management Important Message (Signed)
Important Message  Patient Details  Name: [name] MRN: [id number] Date of Birth: June 05, 1941   Medicare Important Message Given:  Yes     Orbie Pyo 04/03/2019, 2:43 PM

## 2019-04-03 NOTE — Progress Notes (Signed)
Physical Therapy Treatment Patient Details Name: [name] MRN: [id number] DOB: Dec 29, 1940 Today's Date: 04/03/2019    History of Present Illness 78yo female admitted 8/26 with hypotension in the setting of probable contained perforated peptic ulcer     PT Comments    Patient received in bed, reports she is feeling better today. Agrees to PT session. Patient requested to walk to bathroom initially, but upon standing patient having active diarrhea. BSC brought to patient at bedside. After assisting with wash up, patient able to ambulate 12 feet with RW and min guard assist to recliner. Patient fatigued with this. Throughout session patient on room air with sats maintained in the mid to high 90%s. Patient will benefit from continued skilled PT to address her weakness, decreased activity tolerance and decreased independence.       Follow Up Recommendations  Home health PT;Supervision for mobility/OOB;Supervision/Assistance - 24 hour     Equipment Recommendations  Rolling walker with 5" wheels;3in1 (PT)    Recommendations for Other Services       Precautions / Restrictions Precautions Precautions: Fall Restrictions Weight Bearing Restrictions: No    Mobility  Bed Mobility Overal bed mobility: Needs Assistance Bed Mobility: Supine to Sit     Supine to sit: Min assist     General bed mobility comments: use of bed rails, increased effort  Transfers Overall transfer level: Needs assistance Equipment used: Rolling walker (2 wheeled) Transfers: Sit to/from Stand Sit to Stand: Min assist         General transfer comment: Patient stood to try to go into bathroom, was actively having diarrhea. BSC brought to patient.  Ambulation/Gait Ambulation/Gait assistance: Min guard Gait Distance (Feet): 12 Feet Assistive device: Rolling walker (2 wheeled) Gait Pattern/deviations: Step-through pattern;Decreased stride length;Shuffle;Wide base of support Gait velocity: decreased    General Gait Details: fatigued with this distance but also got washed up prior to ambulating.   Stairs             Wheelchair Mobility    Modified Rankin (Stroke Patients Only)       Balance Overall balance assessment: Needs assistance Sitting-balance support: Feet supported Sitting balance-Leahy Scale: Good     Standing balance support: Bilateral upper extremity supported Standing balance-Leahy Scale: Fair Standing balance comment: reliant on RW                            Cognition Arousal/Alertness: Awake/alert Behavior During Therapy: WFL for tasks assessed/performed Overall Cognitive Status: Within Functional Limits for tasks assessed                                        Exercises      General Comments General comments (skin integrity, edema, etc.): O2 sats remained at 90% or higher throughout session on room air.      Pertinent Vitals/Pain Pain Assessment: No/denies pain    Home Living                      Prior Function            PT Goals (current goals can now be found in the care plan section) Acute Rehab PT Goals Patient Stated Goal: Wants to get home PT Goal Formulation: With patient Time For Goal Achievement: 04/15/19 Potential to Achieve Goals: Good Progress towards PT goals: Progressing toward goals  Frequency    Min 3X/week      PT Plan Current plan remains appropriate    Co-evaluation              AM-PAC PT "6 Clicks" Mobility   Outcome Measure  Help needed turning from your back to your side while in a flat bed without using bedrails?: A Little Help needed moving from lying on your back to sitting on the side of a flat bed without using bedrails?: A Little Help needed moving to and from a bed to a chair (including a wheelchair)?: A Little Help needed standing up from a chair using your arms (e.g., wheelchair or bedside chair)?: A Little Help needed to walk in hospital room?:  A Little Help needed climbing 3-5 steps with a railing? : A Lot 6 Click Score: 17    End of Session Equipment Utilized During Treatment: Gait belt Activity Tolerance: Patient tolerated treatment well;Patient limited by fatigue Patient left: in chair;with call bell/phone within reach Nurse Communication: Mobility status PT Visit Diagnosis: Muscle weakness (generalized) (M62.81);Difficulty in walking, not elsewhere classified (R26.2);Other abnormalities of gait and mobility (R26.89)     Time: [id number] PT Time Calculation (min) (ACUTE ONLY): 36 min  Charges:  $Gait Training: 8-22 mins $Therapeutic Activity: 8-22 mins                     Mitzy Naron, PT, GCS 04/03/19,11:29 AM

## 2019-04-03 NOTE — Progress Notes (Signed)
RT set up patient home unit CPAP at beside. Patient is able to place CPAP mask on when she is ready.

## 2019-04-03 NOTE — Progress Notes (Signed)
PROGRESS NOTE    [name]  [id number] DOB: 1941/05/12 DOA: 03/28/2019 PCP: Leanna Battles, MD   Brief Narrative: 78 year old with past medical history significant for hypertension, CVA perforated gastric ulcer 2018 who presented on 8/20 2:06 days of epigastric pain.  Subsequently patient developed nausea and dry heavies and progressive malaise.  In the ED is she had an episode of hematemesis.  CT scan revealed a contained perforated peptic ulcer.  Patient had progressive hypotension in the ER and she was admitted under PCCM service.  Patient was also evaluated by general surgery and GI.  Patient admitted with hypovolemic shock in the setting of GI bleed and sepsis from perforated gastric ulcer.  She was a started on IV fluids, IV Protonix drip and levo fed.  She was able to be weaned off of IV pressors.  Subsequently her care was transferred to try around 8/3 1. Patient underwent upper GI series which showed, showed ulceration along the superior and inferior margins of the gastric antrum with associated fold thickening.  No leak of water soluble contrast medium from the stomach was demonstrated on today's exam.  NG tube was removed, patient was a started on clear diet.  She has been able to tolerate full liquid diet.  In regard to her respiratory status, she has been having some shortness of breath on and off, bilateral wheezing.  Chest x-ray: Left lower atelectasis.  She will receive a dose of oral Lasix yesterday.     Assessment & Plan:   Principal Problem:   Perforated gastric ulcer ([place]) Active Problems:   Hypotension   Hematemesis   Acute lower UTI   Leukocytosis   Hyponatremia   Perforated peptic ulcer (HCC)   1-Hypovolemic and septic shock; related to hematemesis and perforated gastric ulcer.  Patient was a started on IV fluids levophed.  Continue with IV Zosyn. Blood transfusion as needed. She has received 2 units of PRBC during this hospitalization.  BP stable.    IMA globin has remained stable at 8.  2-Contain perforated gastric ulcer with hematemesis: Upper GI bleed.  -She was started on protonix Gtt-- transition to 40 Mg IV BID tonight  --S/P one unit PRBC on 8/27, 8/29. -General surgery following, recommending UGI perform 8/31, showed ulceration along the superior and inferior margins of the gastric antrum with associated fold thickening.  No leak of water soluble contrast medium from the stomach was demonstrated on today's exam. I used gentle compression with the balloon paddle..  -NG tube was removed on 8/30 first.  She was a started on clear diet. -Patient has been able to tolerate clear diet plan to advance to full liquid.   3-HTN; continue to hold  olmersarta, Norvasc and HCTZ.  Resume metoprolol.   4-A fib;  Rate controlled currently.  Continue with metoprolol.   5-H/O COPD; mild flare Continue with home BD  Continue with incentive spirometry. Will schedule Xopenex. We will hold on starting steroids, in the setting of perforated gastric ulcer  Anasarca;  Received one dose of lasix 8/30, 8/31. Stop IV fluids.  Will give one-time dose of oral Lasix today.  Hx OSA;  CPAP  Hypomagnesemia: Replete IV.  Hypokalemia; replete orally.  Estimated body mass index is 54.82 kg/m as calculated from the following:   Height as of this encounter: 5\' 1"  (1.549 m).   Weight as of this encounter: 131.6 kg.   DVT prophylaxis: SCD Code Status: Full Code Family Communication: family at bedside.  Disposition Plan: remain in  the hospital for treatment of perforated Gastric ulcer, not taking oral.  Consultants:   General sx  GI    Procedures:   None  Antimicrobials:  Zosyn   Subjective: She was short of breath this morning, she was off of oxygen.  She denies worsening abdominal pain.  She is currently having bilateral wheezing.  Objective: Vitals:   04/03/19 0747 04/03/19 0816 04/03/19 1121 04/03/19 1156  BP:      Pulse:       Resp:      Temp: 97.8 F (36.6 C)   98.3 F (36.8 C)  TempSrc: Oral   Oral  SpO2:  93% 96%   Weight:      Height:        Intake/Output Summary (Last 24 hours) at 04/03/2019 1408 Last data filed at 04/03/2019 0900 Gross per 24 hour  Intake 800.21 ml  Output 3000 ml  Net -2199.79 ml   Filed Weights   03/31/19 0342 04/02/19 0500 04/03/19 0314  Weight: 134.9 kg (!) 139.6 kg 131.6 kg    Examination:  General exam: No acute distress Respiratory system: Bilateral wheezing Cardiovascular system: S1, S2 regular rhythm and rate Gastrointestinal system: Sounds present, soft, obese, mild tender, no rigidity. Central nervous system: Nonfocal Extremities; trace edema Skin: No rashes   Data Reviewed: I have personally reviewed following labs and imaging studies  CBC: Recent Labs  Lab 03/30/19 0611 03/31/19 0302 03/31/19 1842 04/01/19 0438 04/02/19 0434 04/03/19 1137  WBC 13.1* 9.8  --  8.3 8.3 8.5  HGB 8.1* 7.4* 8.2* 8.3* 8.3* 9.4*  HCT 25.7* 24.4* 26.8* 26.7* 26.5* 31.0*  MCV 91.8 96.1  --  89.9 89.5 90.6  PLT 275 322  --  317 356 [id number]*   Basic Metabolic Panel: Recent Labs  Lab 03/29/19 0358 03/30/19 0611 03/31/19 0302 04/01/19 0438 04/02/19 0434 04/02/19 1120 04/03/19 1137  NA 137 138 138 138 139  --  137  K 4.1 4.0 3.9 3.8 3.1*  --  2.9*  CL 106 107 106 103 101  --  95*  CO2 25 24 23 24 27   --  30  GLUCOSE 112* 100* 95 94 89  --  167*  BUN 35* 21 17 16 13   --  6*  CREATININE 0.88 0.81 0.77 0.76 0.82  --  0.85  CALCIUM 7.4* 7.8* 7.9* 8.4* 8.3*  --  8.5*  MG 1.8 1.7 1.9  --   --  1.5* 1.7  PHOS 3.6 3.0  --   --   --   --   --    GFR: Estimated Creatinine Clearance: 68.9 mL/min (by C-G formula based on SCr of 0.85 mg/dL). Liver Function Tests: Recent Labs  Lab 03/28/19 0524 03/31/19 0302  AST 39 10*  ALT 27 12  ALKPHOS 94 48  BILITOT 2.0* 1.1  PROT 6.8 5.1*  ALBUMIN 3.3* 1.9*   Recent Labs  Lab 03/28/19 0524  LIPASE 28   No results for  input(s): AMMONIA in the last 168 hours. Coagulation Profile: No results for input(s): INR, PROTIME in the last 168 hours. Cardiac Enzymes: No results for input(s): CKTOTAL, CKMB, CKMBINDEX, TROPONINI in the last 168 hours. BNP (last 3 results) No results for input(s): PROBNP in the last 8760 hours. HbA1C: No results for input(s): HGBA1C in the last 72 hours. CBG: Recent Labs  Lab 04/02/19 2006 04/02/19 2300 04/03/19 0316 04/03/19 0747 04/03/19 1308  GLUCAP 130* 126* 123* 113* 144*   Lipid Profile: No results for  input(s): CHOL, HDL, LDLCALC, TRIG, CHOLHDL, LDLDIRECT in the last 72 hours. Thyroid Function Tests: No results for input(s): TSH, T4TOTAL, FREET4, T3FREE, THYROIDAB in the last 72 hours. Anemia Panel: No results for input(s): VITAMINB12, FOLATE, FERRITIN, TIBC, IRON, RETICCTPCT in the last 72 hours. Sepsis Labs: Recent Labs  Lab 03/28/19 1254 03/28/19 1258 03/28/19 1426 03/28/19 2059  PROCALCITON 0.26  --   --   --   LATICACIDVEN  --  1.9 2.2* 2.0*    Recent Results (from the past 240 hour(s))  SARS CORONAVIRUS 2 (TAT 6-12 HRS) Nasal Swab Aptima Multi Swab     Status: None   Collection Time: 03/28/19  8:41 AM   Specimen: Aptima Multi Swab; Nasal Swab  Result Value Ref Range Status   SARS Coronavirus 2 NEGATIVE NEGATIVE Final    Comment: (NOTE) SARS-CoV-2 target nucleic acids are NOT DETECTED. The SARS-CoV-2 RNA is generally detectable in upper and lower respiratory specimens during the acute phase of infection. Negative results do not preclude SARS-CoV-2 infection, do not rule out co-infections with other pathogens, and should not be used as the sole basis for treatment or other patient management decisions. Negative results must be combined with clinical observations, patient history, and epidemiological information. The expected result is Negative. Fact Sheet for Patients: SugarRoll.be Fact Sheet for Healthcare  Providers: https://www.woods-mathews.com/ This test is not yet approved or cleared by the [place] FDA and  has been authorized for detection and/or diagnosis of SARS-CoV-2 by FDA under an Emergency Use Authorization (EUA). This EUA will remain  in effect (meaning this test can be used) for the duration of the COVID-19 declaration under Section 56 4(b)(1) of the Act, 21 U.S.C. section 360bbb-3(b)(1), unless the authorization is terminated or revoked sooner. Performed at [facility], [place] [address], [place], [place] [address]   Urine culture     Status: Abnormal   Collection Time: 03/28/19  8:58 AM   Specimen: Urine, Random  Result Value Ref Range Status   Specimen Description URINE, RANDOM  Final   Special Requests   Final    NONE Performed at [facility], [place] [address] [place], [place] [address]    Culture >=100,000 COLONIES/mL ESCHERICHIA COLI (A)  Final   Report Status 03/30/2019 FINAL  Final   Organism ID, Bacteria ESCHERICHIA COLI (A)  Final      Susceptibility   Escherichia coli - MIC*    AMPICILLIN <=2 SENSITIVE Sensitive     CEFAZOLIN <=4 SENSITIVE Sensitive     CEFTRIAXONE <=1 SENSITIVE Sensitive     CIPROFLOXACIN <=0.25 SENSITIVE Sensitive     GENTAMICIN <=1 SENSITIVE Sensitive     IMIPENEM <=0.25 SENSITIVE Sensitive     NITROFURANTOIN 32 SENSITIVE Sensitive     TRIMETH/SULFA <=20 SENSITIVE Sensitive     AMPICILLIN/SULBACTAM <=2 SENSITIVE Sensitive     PIP/TAZO <=4 SENSITIVE Sensitive     Extended ESBL NEGATIVE Sensitive     * >=100,000 COLONIES/mL ESCHERICHIA COLI  Culture, blood (routine x 2)     Status: None   Collection Time: 03/28/19  2:40 PM   Specimen: BLOOD  Result Value Ref Range Status   Specimen Description BLOOD RIGHT ANTECUBITAL  Final   Special Requests   Final    BOTTLES DRAWN AEROBIC ONLY Blood Culture adequate volume   Culture   Final    NO GROWTH 5 DAYS Performed at [facility], [address]  [place],  [address]    Report Status  04/02/2019 FINAL  Final  Culture, blood (routine x 2)     Status: None   Collection Time: 03/28/19  2:46 PM   Specimen: BLOOD RIGHT HAND  Result Value Ref Range Status   Specimen Description BLOOD RIGHT HAND  Final   Special Requests   Final    BOTTLES DRAWN AEROBIC ONLY Blood Culture adequate volume   Culture   Final    NO GROWTH 5 DAYS Performed at [facility], [address] [place], [place] [address]    Report Status 04/02/2019 FINAL  Final  MRSA PCR Screening     Status: None   Collection Time: 03/30/19  9:37 AM   Specimen: Nasopharyngeal  Result Value Ref Range Status   MRSA by PCR NEGATIVE NEGATIVE Final    Comment:        The GeneXpert MRSA Assay (FDA approved for NASAL specimens only), is one component of a comprehensive MRSA colonization surveillance program. It is not intended to diagnose MRSA infection nor to guide or monitor treatment for MRSA infections. Performed at [facility], [place] [address], [place], [place] [address]          Radiology Studies: Dg Paulene Floor Single Cm (sol Or Thin Ba)  Result Date: 04/02/2019 CLINICAL DATA:  Perforated gastric ulcer. EXAM: UPPER GI SERIES WITH KUB TECHNIQUE: After obtaining a scout radiograph a routine upper GI series was performed using 150 cc Omnipaque 3 Hunt FLUOROSCOPY TIME:  Fluoroscopy Time:  2 minutes, 18 seconds Radiation Exposure Index (if provided by the fluoroscopic device): 14.8 mGy Number of Acquired Spot Images: 0 COMPARISON:  CT scan 03/28/2019 FINDINGS: Initial KUB demonstrates gallstones in the right upper quadrant along with thoracolumbar spinal fixation hardware, prior lower thoracic vertebral augmentations, mild degenerative arthropathy of the hips, left greater than right. Unremarkable bowel gas pattern. Nasogastric tube is present in the stomach. The contrast medium was injected by nasogastric tube. The patient has limited mobility, but we turned tear  from the supine into the right posterior oblique position in order to cause the contrast flow into the gastric antrum and duodenum. A balloon paddle was used for compression in order to better delineate the structures. There is abnormal fold thickening in the gastric antrum both along the superior and inferior margin as shown for example on image 7/10, with suspected ulcerations both superiorly and inferiorly. We did not demonstrate compelling evidence of extension of contrast from the stomach lumen. Normal duodenal morphology. The esophagus was not investigated. The pharyngeal phase of swallowing was not assessed. IMPRESSION: 1. Ulceration along the superior and inferior margins of the gastric antrum with associated fold thickening. 2. No leak of water soluble contrast medium from the stomach was demonstrated on today's exam. I used gentle compression with the balloon paddle. 3. Contrast was injected through the NG tube. The esophagus and pharyngeal phase of swallowing were not assessed. 4. Cholelithiasis. Electronically Signed   By: Van Clines M.D.   On: 04/02/2019 08:49        Scheduled Meds:  amoxicillin-clavulanate  1 tablet Oral Q12H   Chlorhexidine Gluconate Cloth  6 each Topical Daily   fluticasone furoate-vilanterol  1 puff Inhalation Daily   levalbuterol  0.63 mg Nebulization BID   metoprolol tartrate  25 mg Oral BID   ondansetron (ZOFRAN) IV  4 mg Intravenous Once   pantoprazole  40 mg Intravenous Q12H   sodium chloride flush  3 mL Intravenous Q12H   umeclidinium bromide  1 puff  Inhalation Daily   Continuous Infusions:  sodium chloride 20 mL/hr at 04/01/19 1428     LOS: 6 days    Time spent: 35 minutes.     Elmarie Shiley, MD [facility] Pager [phone]  If 7PM-7AM, please contact night-coverage [email or web address] Password [email or web address] 04/03/2019, 2:08 PM

## 2019-04-03 NOTE — Progress Notes (Signed)
Patient has home CPAP at bedside with 3L bled in.  RT assistance not needed at this time.

## 2019-04-04 LAB — BASIC METABOLIC PANEL
Anion gap: 12 (ref 5–15)
BUN: <5 mg/dL — ABNORMAL LOW (ref 8–23)
CO2: 27 mmol/L (ref 22–32)
Calcium: 8.6 mg/dL — ABNORMAL LOW (ref 8.9–10.3)
Chloride: 101 mmol/L (ref 98–111)
Creatinine, Ser: 0.71 mg/dL (ref 0.44–1.00)
GFR calc Af Amer: >60 mL/min (ref >60)
GFR calc non Af Amer: >60 mL/min (ref >60)
Glucose, Bld: 168 mg/dL — ABNORMAL HIGH (ref 70–99)
Potassium: 3.7 mmol/L (ref 3.5–5.1)
Sodium: 140 mmol/L (ref 135–145)

## 2019-04-04 LAB — GLUCOSE, CAPILLARY
Glucose-Capillary: 116 mg/dL — ABNORMAL HIGH (ref 70–99)
Glucose-Capillary: 117 mg/dL — ABNORMAL HIGH (ref 70–99)
Glucose-Capillary: 126 mg/dL — ABNORMAL HIGH (ref 70–99)
Glucose-Capillary: 132 mg/dL — ABNORMAL HIGH (ref 70–99)

## 2019-04-04 MED ORDER — AMOXICILLIN-POT CLAVULANATE 875-125 MG PO TABS: 1.0000 | ORAL_TABLET | Freq: Two times a day (BID) | ORAL | 0 refills | Status: AC

## 2019-04-04 MED ORDER — PANTOPRAZOLE SODIUM 40 MG PO TBEC: 40.0000 mg | DELAYED_RELEASE_TABLET | Freq: Two times a day (BID) | ORAL | 0 refills | Status: DC

## 2019-04-04 MED ORDER — METOPROLOL TARTRATE 25 MG PO TABS: 50.0000 mg | ORAL_TABLET | Freq: Two times a day (BID) | ORAL | 0 refills | Status: DC

## 2019-04-04 MED ORDER — PANTOPRAZOLE SODIUM 40 MG PO TBEC: 40.0000 mg | DELAYED_RELEASE_TABLET | Freq: Two times a day (BID) | ORAL | Status: DC

## 2019-04-04 MED ORDER — LEVALBUTEROL HCL 0.63 MG/3ML IN NEBU
0.6300 mg | INHALATION_SOLUTION | Freq: Two times a day (BID) | RESPIRATORY_TRACT | Status: DC
  Administered 2019-04-04: 0.63 mg via RESPIRATORY_TRACT
  Filled 2019-04-04: qty 3

## 2019-04-04 NOTE — Progress Notes (Signed)
Occupational Therapy Treatment Patient Details Name: [name] MRN: [id number] DOB: 11-13-1940 Today's Date: 04/04/2019    History of present illness 78yo female admitted 8/26 with hypotension in the setting of probable contained perforated peptic ulcer    OT comments  Pt seated in recliner chair with daughter present in room and ready to discharge this afternoon. Pt declined mobility secondary to concerns of energy needed to return home. OT also providing education on energy conservation with self care and home management tasks when returning home in order to decreased risk of fall and increase safety/indpendence. OT educated and demonstrated use of B UE strengthening exercises with level 1 resistive theraband. Pt needing min cuing for proper technique.   Follow Up Recommendations  Home health OT;Supervision/Assistance - 24 hour    Equipment Recommendations  None recommended by OT       Precautions / Restrictions Precautions Precautions: Fall Precaution Comments: NG Tube Restrictions Weight Bearing Restrictions: No       Mobility Bed Mobility    General bed mobility comments: seated in recliner chair upon entering the room  Transfers       General transfer comment: declined secondary to energy conservation with pt leaving today    Balance Overall balance assessment: Needs assistance Sitting-balance support: Feet supported Sitting balance-Leahy Scale: Good        ADL either performed or assessed with clinical judgement                  Cognition Arousal/Alertness: Awake/alert Behavior During Therapy: WFL for tasks assessed/performed Overall Cognitive Status: Within Functional Limits for tasks assessed                        Pertinent Vitals/ Pain       Pain Assessment: Faces Faces Pain Scale: No hurt         Frequency  Min 3X/week        Progress Toward Goals  OT Goals(current goals can now be found in the care plan section)  Progress  towards OT goals: Progressing toward goals  Acute Rehab OT Goals Patient Stated Goal: going home today OT Goal Formulation: With patient Time For Goal Achievement: 04/18/19 Potential to Achieve Goals: Good  Plan Discharge plan remains appropriate       AM-PAC OT "6 Clicks" Daily Activity     Outcome Measure   Help from another person eating meals?: A Little Help from another person taking care of personal grooming?: A Little Help from another person toileting, which includes using toliet, bedpan, or urinal?: A Lot Help from another person bathing (including washing, rinsing, drying)?: A Lot Help from another person to put on and taking off regular upper body clothing?: A Little Help from another person to put on and taking off regular lower body clothing?: A Lot 6 Click Score: 15    End of Session Equipment Utilized During Treatment: Oxygen  OT Visit Diagnosis: Unsteadiness on feet (R26.81);Other abnormalities of gait and mobility (R26.89);Muscle weakness (generalized) (M62.81)   Activity Tolerance Patient tolerated treatment well   Patient Left in bed;with call bell/phone within reach;with family/visitor present;in chair;with chair alarm set   Nurse Communication Mobility status        Time: [id number] OT Time Calculation (min): 15 min  Charges: OT General Charges $OT Visit: 1 Visit OT Treatments $Therapeutic Activity: 8-22 mins   Akita Maxim P, MS, OTR/L 04/04/2019, 2:02 PM

## 2019-04-04 NOTE — Discharge Summary (Signed)
Physician Discharge Summary  [name] [id number] DOB: [birth year] DOA: 03/28/2019  PCP: Leanna Battles, MD  Admit date: 03/28/2019 Discharge date: 04/04/2019  Admitted From: home Disposition:  Home w [facility]  Recommendations for Outpatient Follow-up:  1. Follow up with PCP in 1-2 weeks 2. Please obtain BMP/CBC in one week 3. Please follow up on the following pending results:  Home Health: Yes Equipment/Devices: none  Discharge Condition: Stable CODE STATUS: FULL Diet recommendation: Soft diet   Brief/Interim Summary:  78 year old with past medical history significant for hypertension, CVA perforated gastric ulcer 2018 who presented on 8/20 2:06 days of epigastric pain.  Subsequently patient developed nausea and dry heavies and progressive malaise.  In the ED is she had an episode of hematemesis.  CT scan revealed a contained perforated peptic ulcer.  Patient had progressive hypotension in the ER and she was admitted under PCCM service.  Patient was also evaluated by general surgery and GI.  Patient was admitted with hypovolemic shock in the setting of GI bleed and sepsis from perforated gastric ulcer.  She was a started on IV fluids, IV Protonix drip and levophed.  She was able to be weaned off of IV pressors.  Subsequently her care was transferred to [facility] around 8/31.Patient underwent upper GI series which showed, showed ulceration along the superior and inferior margins of the gastric antrum with associated fold thickening.  No leak of water soluble contrast medium from the stomach was demonstrated.NG tube was removed, patient was a started on clear diet.  She has been able to tolerate full liquid diet-and subsequently changed to soft diet. In regard to her respiratory status, she has been having some shortness of breath on and off, bilateral wheezing.  Chest x-ray: Left lower atelectasis s/p  oral Lasix. At this time she has clinically improved.  Doing well on room air. Seen by  surgery diet advanced to soft diet and cleared for discharge on soft diet.  Discharge Diagnoses:  Principal Problem:   Perforated gastric ulcer ([place]) Active Problems:   Hypotension   Hematemesis   Acute lower UTI   Leukocytosis   Hyponatremia   Perforated peptic ulcer (HCC)  Hypovolemic and septic shock; related to hematemesis and perforated gastric ulcer.  Patient was treated with IV fluids/levophed. Status post 2 UNITS blood transfusion. At this time hemodynamically stable, hb stable at 9.4 g.  Contain perforated gastric ulcer with hematemesis: Upper GI bleed.  Managed conservatively, now on soft diet tolerating, to continue Protonix twice daily and follow-up with GI for EGD in 6 to 8 weeks.  Patient was treated with Zosyn, now on Augmentin General surgery- UGI performed 8/31, showed ulceration along the superior and inferior margins of the gastric antrum with associated fold thickening.  No leak of water soluble contrast medium from the stomach was demonstrated.NG tube was removed on 8/31- placed on diet  HTN; Blood pressure remains a stable, will continue to hold  Her olmersarta, Norvasc and HCTZ. Back on metoprolol.   A fib; Rate controlled continue with metoprolol.   H/O COPD; mild flare: Continue with home BD , IS  Anasarca; Received one dose of lasix 8/30, 8/31., off ivf. Also got oral Lasix 9/1 Hx OSA; cont CPAP  Hypomagnesemia: Repleted Hypokalemia;  improved.   Morbid  Obesity with BMI 54.82 kg/m :advise weight loss and healthy lifestyle.    DVT prophylaxis: SCD Code Status: Full Code Family Communication: family at bedside.  Disposition Plan:  seen by [id number] able to ambulate 12 feet  with RW and min guard assist to recliner. Patient fatigued with this.Throughout session patient on room air with sats maintained in the mid to high 90%s.  DISCUSSED w patient and daughter and they plan to go home if tolerating soft diet. No more diarrhea today- had some after  initiating food 04/03/19.   Consultants:   General sx  GI   Discharge Instructions  Discharge Instructions    Diet - low sodium heart healthy   Complete by: As directed    Discharge instructions   Complete by: As directed    Please call call MD or return to ER for similar or worsening recurring problem that brought you to hospital or if any fever,nausea/vomiting,abdominal pain, uncontrolled pain, chest pain,  shortness of breath or any other alarming symptoms.  Please follow-up with your family doctor to check a CBC BMP on Monday.  Please follow-up with your primary gastroenterologist for endoscopy in 6 to 8 weeks.  Please follow-up your doctor as instructed in a week time and call the office for appointment.  Please avoid alcohol, smoking, or any other illicit substance and maintain healthy habits including taking your regular medications as prescribed.  You were cared for by a hospitalist during your hospital stay. If you have any questions about your discharge medications or the care you received while you were in the hospital after you are discharged, you can call the unit and ask to speak with the hospitalist on call if the hospitalist that took care of you is not available.  Once you are discharged, your primary care physician will handle any further medical issues. Please note that NO REFILLS for any discharge medications will be authorized once you are discharged, as it is imperative that you return to your primary care physician (or establish a relationship with a primary care physician if you do not have one) for your aftercare needs so that they can reassess your need for medications and monitor your lab values   Increase activity slowly   Complete by: As directed      Allergies as of 04/04/2019   No Known Allergies     Medication List    STOP taking these medications   aspirin EC 81 MG tablet   Tribenzor 40-5-12.5 MG Tabs Generic drug: Olmesartan-amLODIPine-HCTZ      TAKE these medications   acetaminophen 500 MG tablet Commonly known as: TYLENOL Take 1,000 mg by mouth 2 (two) times daily.   amoxicillin-clavulanate 875-125 MG tablet Commonly known as: AUGMENTIN Take 1 tablet by mouth every 12 (twelve) hours for 3 days.   ASPERCREME LIDOCAINE EX Apply 1 application topically 2 (two) times daily as needed (knee pain).   Biofreeze 4 % Gel Generic drug: Menthol (Topical Analgesic) Apply 1 application topically 2 (two) times daily as needed (knee pain).   DULoxetine 60 MG capsule Commonly known as: CYMBALTA Take 60 mg by mouth daily.   furosemide 40 MG tablet Commonly known as: LASIX Take 40 mg by mouth daily as needed (for fluid retention.).   lubiprostone 24 MCG capsule Commonly known as: AMITIZA Take 24 mcg by mouth daily.   metoprolol tartrate 25 MG tablet Commonly known as: LOPRESSOR Take 2 tablets (50 mg total) by mouth 2 (two) times daily. What changed: medication strength   montelukast 10 MG tablet Commonly known as: SINGULAIR Take 10 mg by mouth daily.   multivitamin with minerals Tabs tablet Take 1 tablet by mouth daily.   ondansetron 4 MG disintegrating tablet Commonly known as:  ZOFRAN-ODT Take 4 mg by mouth every 8 (eight) hours as needed for nausea or vomiting.   pantoprazole 40 MG tablet Commonly known as: PROTONIX Take 1 tablet (40 mg total) by mouth 2 (two) times daily.   potassium chloride SA 20 MEQ tablet Commonly known as: K-DUR Take 20 mEq by mouth at bedtime.   pravastatin 20 MG tablet Commonly known as: PRAVACHOL Take 20 mg by mouth daily.   PRESCRIPTION MEDICATION Inhale into the lungs at bedtime. CPAP   albuterol (2.5 MG/3ML) 0.083% nebulizer solution Commonly known as: PROVENTIL Take 2.5 mg by nebulization every 6 (six) hours as needed for wheezing or shortness of breath.   ProAir HFA 108 (90 Base) MCG/ACT inhaler Generic drug: albuterol Inhale 2 puffs into the lungs every 6 (six) hours as  needed for wheezing or shortness of breath.   promethazine 25 MG tablet Commonly known as: PHENERGAN Take 25 mg by mouth every 8 (eight) hours as needed for nausea or vomiting.   senna 8.6 MG Tabs tablet Commonly known as: SENOKOT Take 4 tablets by mouth daily as needed for mild constipation.   traMADol 50 MG tablet Commonly known as: ULTRAM Take 50 mg by mouth 3 (three) times daily as needed for moderate pain.   Trelegy Ellipta 100-62.5-25 MCG/INH Aepb Generic drug: Fluticasone-Umeclidin-Vilant Inhale 1 puff into the lungs daily.   Vitamin D (Ergocalciferol) 1.25 MG (50000 UT) Caps capsule Commonly known as: DRISDOL Take 50,000 Units by mouth every Monday. In the evening.      Follow-up Information    Care, [facility] Follow up.   Specialty: [place] Why: HHRN/PT/OT/aide arranged with [facility]- they will contact you to arrange services Contact information: [address] [place] [address] [phone]        Leanna Battles, MD Follow up in 1 week(s).   Specialty: Internal Medicine Why: need cbc/bmp coming monday Contact information: [place] [address] [phone]        [name], MD Follow up in 6 week(s).   Specialty: Gastroenterology Contact information: [address] [place] [address] [phone]          No Known Allergies  Consultations:  Surgery   Procedures/Studies: Dg Abd 1 View  Result Date: 03/29/2019 CLINICAL DATA:  Perforation of stomach after ingestion of foreign material. NG tube placement. EXAM: ABDOMEN - 1 VIEW COMPARISON:  CT 03/28/2019. FINDINGS: NG tube noted coiled in the left upper quadrant most likely in the fundus of the stomach. No gastric distention. No bowel distention. No free air identified. Prior thoracolumbar spine fusion. Prior thoracic and lumbar vertebroplasties. IMPRESSION: NG tube noted coiled of the fundus of the stomach. No  gastric distention. Electronically Signed   By: Marcello Moores  Register   On: 03/29/2019 11:46   Ct Abdomen Pelvis W Contrast  Addendum Date: 03/28/2019   ADDENDUM REPORT: 03/28/2019 08:32 ADDENDUM: Study discussed by telephone with Dr. Wilson Singer in the ED on 03/28/2019 at 0822 hours. Electronically Signed   By: Genevie Ann M.D.   On: 03/28/2019 08:32   Result Date: 03/28/2019 CLINICAL DATA:  78 year old female with abdominal pain, nausea and vomiting since last night. EXAM: CT ABDOMEN AND PELVIS WITH CONTRAST TECHNIQUE: Multidetector CT imaging of the abdomen and pelvis was performed using the standard protocol following bolus administration of intravenous contrast. CONTRAST:  169mL ISOVUE-300 IOPAMIDOL (ISOVUE-300) INJECTION 61% COMPARISON:  CT Abdomen and Pelvis 01/31/2017. FINDINGS: Lower chest: Lung bases  have not significantly changed with chronic lower lobe atelectasis or scarring. There is superimposed mosaic attenuation today, probably gas trapping. Stable mild cardiomegaly. No pericardial or pleural effusion. Hepatobiliary: 19 millimeter gallstones. No pericholecystic inflammation. Normal liver enhancement. No bile duct enlargement. Pancreas: Negative. Spleen: Negative. Adrenals/Urinary Tract: Negative adrenal glands. Stable kidneys. Bilateral renal enhancement and contrast excretion is symmetric and normal. Small left renal parapelvic cysts. Unremarkable ureter. Stomach/Bowel: Negative rectum aside from retained stool. Small chronic calcified perirectal lymph node is unchanged. Mild diverticulosis in the sigmoid colon, no active inflammation. Negative descending colon. Redundant but otherwise negative transverse colon. Negative right colon. Diminutive appendix redemonstrated. Negative terminal ileum. No dilated small bowel. There is confluent inflammatory stranding along the distal stomach with trace pneumoperitoneum in the adjacent mesentery on series 4, image 40. This is a similar appearance to that in 2018. The  proximal duodenum is relatively spared. There is no discrete gastric lesion. No other free air. Small phlegmon like nodularity in the adjacent mesentery up to 2 centimeters. Retained fluid in the proximal stomach. No free fluid. Vascular/Lymphatic: Aortoiliac calcified atherosclerosis. Major arterial structures are patent. Portal venous system is patent. Reproductive: Surgically absent uterus. Diminutive or absent ovaries. Other: No pelvic free fluid. Musculoskeletal: Widespread thoracolumbar compression fractures and multilevel prior vertebral augmentation. Chronic posterior spinal rods. Osteopenia. Degenerative changes also at the hips and pubic symphysis. No acute osseous abnormality identified. IMPRESSION: 1. Inflammation and trace pneumoperitoneum along the distal stomach, similar to the abnormal CT in 2018, compatible with contained Perforated Peptic Ulcer. Today the duodenum appears relatively spared. No discrete gastric lesion. No free fluid. 2. No other acute findings. Chronic findings include: Lung base scarring, cholelithiasis, osteopenia with spinal compression fractures, aortic Atherosclerosis (ICD10-I70.0). Electronically Signed: By: Genevie Ann M.D. On: 03/28/2019 08:14   Dg Chest Port 1 View  Result Date: 04/01/2019 CLINICAL DATA:  Shortness of breath EXAM: PORTABLE CHEST 1 VIEW COMPARISON:  03/31/2019 FINDINGS: An NG tube is noted entering the stomach with tip off field of view. Slightly improved LEFT basilar aeration with continued atelectasis/airspace disease. No pneumothorax or definite pleural effusion. Spinal surgical hardware and evidence of thoracic vertebral augmentation again noted. IMPRESSION: Improved LEFT basilar aeration without other significant change. Electronically Signed   By: Margarette Canada M.D.   On: 04/01/2019 14:17   Dg Chest Port 1 View  Result Date: 03/31/2019 CLINICAL DATA:  Respiratory failure EXAM: PORTABLE CHEST 1 VIEW COMPARISON:  02/08/2017 FINDINGS: Nasogastric tube  terminates at the body of the stomach. Thoracic spine fixation. Midline trachea. Normal heart size. Small left pleural effusion, similar. No pneumothorax. Low lung volumes. Left base airspace disease. IMPRESSION: Small left pleural effusion with left base atelectasis versus infection. Electronically Signed   By: Abigail Miyamoto M.D.   On: 03/31/2019 06:47   Dg Duanne Limerick W Single Cm (sol Or Thin Ba)  Result Date: 04/02/2019 CLINICAL DATA:  Perforated gastric ulcer. EXAM: UPPER GI SERIES WITH KUB TECHNIQUE: After obtaining a scout radiograph a routine upper GI series was performed using 150 cc Omnipaque 3 Hunt FLUOROSCOPY TIME:  Fluoroscopy Time:  2 minutes, 18 seconds Radiation Exposure Index (if provided by the fluoroscopic device): 14.8 mGy Number of Acquired Spot Images: 0 COMPARISON:  CT scan 03/28/2019 FINDINGS: Initial KUB demonstrates gallstones in the right upper quadrant along with thoracolumbar spinal fixation hardware, prior lower thoracic vertebral augmentations, mild degenerative arthropathy of the hips, left greater than right. Unremarkable bowel gas pattern. Nasogastric tube is present in the stomach. The contrast medium  was injected by nasogastric tube. The patient has limited mobility, but we turned tear from the supine into the right posterior oblique position in order to cause the contrast flow into the gastric antrum and duodenum. A balloon paddle was used for compression in order to better delineate the structures. There is abnormal fold thickening in the gastric antrum both along the superior and inferior margin as shown for example on image 7/10, with suspected ulcerations both superiorly and inferiorly. We did not demonstrate compelling evidence of extension of contrast from the stomach lumen. Normal duodenal morphology. The esophagus was not investigated. The pharyngeal phase of swallowing was not assessed. IMPRESSION: 1. Ulceration along the superior and inferior margins of the gastric antrum  with associated fold thickening. 2. No leak of water soluble contrast medium from the stomach was demonstrated on today's exam. I used gentle compression with the balloon paddle. 3. Contrast was injected through the NG tube. The esophagus and pharyngeal phase of swallowing were not assessed. 4. Cholelithiasis. Electronically Signed   By: Van Clines M.D.   On: 04/02/2019 08:49    Subjective: Resting well, no new complaints. Tolerating diet.   Discharge Exam: Vitals:   04/04/19 0805 04/04/19 1214  BP:    Pulse:    Resp:    Temp:  98.8 F (37.1 C)  SpO2: 99%    Vitals:   04/04/19 0757 04/04/19 0800 04/04/19 0805 04/04/19 1214  BP:  (!) 116/49    Pulse:  79    Resp:  (!) 23    Temp: 98.3 F (36.8 C)   98.8 F (37.1 C)  TempSrc: Oral   Oral  SpO2:  97% 99%   Weight:      Height:       General: Pt is alert, awake, not in acute distress. Cardiovascular: RRR, S1/S2 +, no rubs, no gallops. Respiratory: CTA bilaterally, no wheezing, no rhonchi. Abdominal: Soft, NT, ND, bowel sounds +. Extremities: no edema, no cyanosis.  The results of significant diagnostics from this hospitalization (including imaging, microbiology, ancillary and laboratory) are listed below for reference.     Microbiology: Recent Results (from the past 240 hour(s))  SARS CORONAVIRUS 2 (TAT 6-12 HRS) Nasal Swab Aptima Multi Swab     Status: None   Collection Time: 03/28/19  8:41 AM   Specimen: Aptima Multi Swab; Nasal Swab  Result Value Ref Range Status   SARS Coronavirus 2 NEGATIVE NEGATIVE Final    Comment: (NOTE) SARS-CoV-2 target nucleic acids are NOT DETECTED. The SARS-CoV-2 RNA is generally detectable in upper and lower respiratory specimens during the acute phase of infection. Negative results do not preclude SARS-CoV-2 infection, do not rule out co-infections with other pathogens, and should not be used as the sole basis for treatment or other patient management decisions. Negative results  must be combined with clinical observations, patient history, and epidemiological information. The expected result is Negative. Fact Sheet for Patients: SugarRoll.be Fact Sheet for Healthcare Providers: https://www.woods-mathews.com/ This test is not yet approved or cleared by the [place] FDA and  has been authorized for detection and/or diagnosis of SARS-CoV-2 by FDA under an Emergency Use Authorization (EUA). This EUA will remain  in effect (meaning this test can be used) for the duration of the COVID-19 declaration under Section 56 4(b)(1) of the Act, 21 U.S.C. section 360bbb-3(b)(1), unless the authorization is terminated or revoked sooner. Performed at [facility], [place] [address], [place], [place] [address]   Urine culture  Status: Abnormal   Collection Time: 03/28/19  8:58 AM   Specimen: Urine, Random  Result Value Ref Range Status   Specimen Description URINE, RANDOM  Final   Special Requests   Final    NONE Performed at [facility], [place] [address], [place], [place] [address]    Culture >=100,000 COLONIES/mL ESCHERICHIA COLI (A)  Final   Report Status 03/30/2019 FINAL  Final   Organism ID, Bacteria ESCHERICHIA COLI (A)  Final      Susceptibility   Escherichia coli - MIC*    AMPICILLIN <=2 SENSITIVE Sensitive     CEFAZOLIN <=4 SENSITIVE Sensitive     CEFTRIAXONE <=1 SENSITIVE Sensitive     CIPROFLOXACIN <=0.25 SENSITIVE Sensitive     GENTAMICIN <=1 SENSITIVE Sensitive     IMIPENEM <=0.25 SENSITIVE Sensitive     NITROFURANTOIN 32 SENSITIVE Sensitive     TRIMETH/SULFA <=20 SENSITIVE Sensitive     AMPICILLIN/SULBACTAM <=2 SENSITIVE Sensitive     PIP/TAZO <=4 SENSITIVE Sensitive     Extended ESBL NEGATIVE Sensitive     * >=100,000 COLONIES/mL ESCHERICHIA COLI  Culture, blood (routine x 2)     Status: None   Collection Time: 03/28/19  2:40 PM   Specimen: BLOOD  Result Value Ref Range Status   Specimen  Description BLOOD RIGHT ANTECUBITAL  Final   Special Requests   Final    BOTTLES DRAWN AEROBIC ONLY Blood Culture adequate volume   Culture   Final    NO GROWTH 5 DAYS Performed at [facility], [address], [place], [place] [address]    Report Status 04/02/2019 FINAL  Final  Culture, blood (routine x 2)     Status: None   Collection Time: 03/28/19  2:46 PM   Specimen: BLOOD RIGHT HAND  Result Value Ref Range Status   Specimen Description BLOOD RIGHT HAND  Final   Special Requests   Final    BOTTLES DRAWN AEROBIC ONLY Blood Culture adequate volume   Culture   Final    NO GROWTH 5 DAYS Performed at [facility], [place] [address] [place], [place] [address]    Report Status 04/02/2019 FINAL  Final  MRSA PCR Screening     Status: None   Collection Time: 03/30/19  9:37 AM   Specimen: Nasopharyngeal  Result Value Ref Range Status   MRSA by PCR NEGATIVE NEGATIVE Final    Comment:        The GeneXpert MRSA Assay (FDA approved for NASAL specimens only), is one component of a comprehensive MRSA colonization surveillance program. It is not intended to diagnose MRSA infection nor to guide or monitor treatment for MRSA infections. Performed at [facility], [place] [address], [place], [place] [address]      Labs: BNP (last 3 results) No results for input(s): BNP in the last 8760 hours. Basic Metabolic Panel: Recent Labs  Lab 03/29/19 0358 03/30/19 0611 03/31/19 0302 04/01/19 0438 04/02/19 0434 04/02/19 1120 04/03/19 1137 04/04/19 0945  NA 137 138 138 138 139  --  137 140  K 4.1 4.0 3.9 3.8 3.1*  --  2.9* 3.7  CL 106 107 106 103 101  --  95* 101  CO2 25 24 23 24 27   --  30 27  GLUCOSE 112* 100* 95 94 89  --  167* 168*  BUN 35* 21 17 16 13   --  6* <5*  CREATININE 0.88 0.81 0.77 0.76 0.82  --  0.85  0.71  CALCIUM 7.4* 7.8* 7.9* 8.4* 8.3*  --  8.5* 8.6*  MG 1.8 1.7 1.9  --   --  1.5* 1.7  --   PHOS 3.6 3.0  --   --   --   --   --   --    Liver Function  Tests: Recent Labs  Lab 03/31/19 0302  AST 10*  ALT 12  ALKPHOS 48  BILITOT 1.1  PROT 5.1*  ALBUMIN 1.9*   No results for input(s): LIPASE, AMYLASE in the last 168 hours. No results for input(s): AMMONIA in the last 168 hours. CBC: Recent Labs  Lab 03/30/19 0611 03/31/19 0302 03/31/19 1842 04/01/19 0438 04/02/19 0434 04/03/19 1137  WBC 13.1* 9.8  --  8.3 8.3 8.5  HGB 8.1* 7.4* 8.2* 8.3* 8.3* 9.4*  HCT 25.7* 24.4* 26.8* 26.7* 26.5* 31.0*  MCV 91.8 96.1  --  89.9 89.5 90.6  PLT 275 322  --  317 356 463*   Cardiac Enzymes: No results for input(s): CKTOTAL, CKMB, CKMBINDEX, TROPONINI in the last 168 hours. BNP: Invalid input(s): POCBNP CBG: Recent Labs  Lab 04/03/19 2031 04/04/19 0011 04/04/19 0413 04/04/19 0755 04/04/19 1211  GLUCAP 125* 126* 117* 132* 116*   D-Dimer No results for input(s): DDIMER in the last 72 hours. Hgb A1c No results for input(s): HGBA1C in the last 72 hours. Lipid Profile No results for input(s): CHOL, HDL, LDLCALC, TRIG, CHOLHDL, LDLDIRECT in the last 72 hours. Thyroid function studies No results for input(s): TSH, T4TOTAL, T3FREE, THYROIDAB in the last 72 hours.  Invalid input(s): FREET3 Anemia work up No results for input(s): VITAMINB12, FOLATE, FERRITIN, TIBC, IRON, RETICCTPCT in the last 72 hours. Urinalysis    Component Value Date/Time   COLORURINE YELLOW 03/28/2019 0809   APPEARANCEUR CLEAR 03/28/2019 0809   LABSPEC 1.035 (H) 03/28/2019 0809   PHURINE 5.0 03/28/2019 0809   GLUCOSEU NEGATIVE 03/28/2019 0809   HGBUR NEGATIVE 03/28/2019 0809   BILIRUBINUR NEGATIVE 03/28/2019 0809   KETONESUR NEGATIVE 03/28/2019 0809   PROTEINUR NEGATIVE 03/28/2019 0809   UROBILINOGEN 1.0 07/19/2011 0624   NITRITE POSITIVE (A) 03/28/2019 0809   LEUKOCYTESUR NEGATIVE 03/28/2019 0809   Sepsis Labs Invalid input(s): PROCALCITONIN,  WBC,  LACTICIDVEN Microbiology Recent Results (from the past 240 hour(s))  SARS CORONAVIRUS 2 (TAT 6-12 HRS)  Nasal Swab Aptima Multi Swab     Status: None   Collection Time: 03/28/19  8:41 AM   Specimen: Aptima Multi Swab; Nasal Swab  Result Value Ref Range Status   SARS Coronavirus 2 NEGATIVE NEGATIVE Final    Comment: (NOTE) SARS-CoV-2 target nucleic acids are NOT DETECTED. The SARS-CoV-2 RNA is generally detectable in upper and lower respiratory specimens during the acute phase of infection. Negative results do not preclude SARS-CoV-2 infection, do not rule out co-infections with other pathogens, and should not be used as the sole basis for treatment or other patient management decisions. Negative results must be combined with clinical observations, patient history, and epidemiological information. The expected result is Negative. Fact Sheet for Patients: SugarRoll.be Fact Sheet for Healthcare Providers: https://www.woods-mathews.com/ This test is not yet approved or cleared by the [place] FDA and  has been authorized for detection and/or diagnosis of SARS-CoV-2 by FDA under an Emergency Use Authorization (EUA). This EUA will remain  in effect (meaning this test can be used) for the duration of the COVID-19 declaration under Section 56 4(b)(1) of the Act, 21 U.S.C. section 360bbb-3(b)(1), unless the authorization is terminated or revoked sooner.  Performed at [facility], [place] [address] [place], [place] [address]   Urine culture     Status: Abnormal   Collection Time: 03/28/19  8:58 AM   Specimen: Urine, Random  Result Value Ref Range Status   Specimen Description URINE, RANDOM  Final   Special Requests   Final    NONE Performed at [facility], [place] [address], [place], [place] [address]    Culture >=100,000 COLONIES/mL ESCHERICHIA COLI (A)  Final   Report Status 03/30/2019 FINAL  Final   Organism ID, Bacteria ESCHERICHIA COLI (A)  Final      Susceptibility   Escherichia coli - MIC*    AMPICILLIN <=2 SENSITIVE  Sensitive     CEFAZOLIN <=4 SENSITIVE Sensitive     CEFTRIAXONE <=1 SENSITIVE Sensitive     CIPROFLOXACIN <=0.25 SENSITIVE Sensitive     GENTAMICIN <=1 SENSITIVE Sensitive     IMIPENEM <=0.25 SENSITIVE Sensitive     NITROFURANTOIN 32 SENSITIVE Sensitive     TRIMETH/SULFA <=20 SENSITIVE Sensitive     AMPICILLIN/SULBACTAM <=2 SENSITIVE Sensitive     PIP/TAZO <=4 SENSITIVE Sensitive     Extended ESBL NEGATIVE Sensitive     * >=100,000 COLONIES/mL ESCHERICHIA COLI  Culture, blood (routine x 2)     Status: None   Collection Time: 03/28/19  2:40 PM   Specimen: BLOOD  Result Value Ref Range Status   Specimen Description BLOOD RIGHT ANTECUBITAL  Final   Special Requests   Final    BOTTLES DRAWN AEROBIC ONLY Blood Culture adequate volume   Culture   Final    NO GROWTH 5 DAYS Performed at [facility], [address] [place], [place] [address]    Report Status 04/02/2019 FINAL  Final  Culture, blood (routine x 2)     Status: None   Collection Time: 03/28/19  2:46 PM   Specimen: BLOOD RIGHT HAND  Result Value Ref Range Status   Specimen Description BLOOD RIGHT HAND  Final   Special Requests   Final    BOTTLES DRAWN AEROBIC ONLY Blood Culture adequate volume   Culture   Final    NO GROWTH 5 DAYS Performed at [facility], [place] [address], [place], [place] [address]    Report Status 04/02/2019 FINAL  Final  MRSA PCR Screening     Status: None   Collection Time: 03/30/19  9:37 AM   Specimen: Nasopharyngeal  Result Value Ref Range Status   MRSA by PCR NEGATIVE NEGATIVE Final    Comment:        The GeneXpert MRSA Assay (FDA approved for NASAL specimens only), is one component of a comprehensive MRSA colonization surveillance program. It is not intended to diagnose MRSA infection nor to guide or monitor treatment for MRSA infections. Performed at [facility], [place] [address], [place], [place] [address]      Time coordinating discharge: 35  minutes  SIGNED:   Antonieta Pert, MD  [facility] 04/04/2019, 1:42 PM  If 7PM-7AM, please contact night-coverage [email or web address]

## 2019-04-04 NOTE — TOC Transition Note (Signed)
Transition of Care [facility]) - CM/SW Discharge Note Marvetta Gibbons RN,BSN Transitions of Care Unit 5W cross coverage - RN Case Manager [phone]   Patient Details  Name: [name] MRN: [id number] Date of Birth: [birth year]  Transition [facility]) CM/SW Contact:  Dawayne Patricia, RN Phone Number: 04/04/2019, 2:50 PM   Clinical Narrative:    Pt stable for transition home today, HH has been set up with [facility]- spoke with Tommi Rumps at [facility] on 9/1 and they are ready for transition home today. No DME needs noted. [facility]N to follow post discharge   Final next level of care: [place] Barriers to Discharge: No Barriers Identified, Barriers Resolved   Patient Goals and CMS Choice Patient states their goals for this hospitalization and ongoing recovery are:: to go home CMS Medicare.gov Compare Post Acute Care list provided to:: Patient Choice offered to / list presented to : Patient  Discharge Placement   Home with [facility]                     Discharge Plan and Services   Discharge Planning Services: CM Consult Post Acute Care Choice: Home Health          DME Arranged: N/A DME Agency: NA       HH Arranged: OT HH Agency: [place] Date [facility] Agency Contacted: 04/02/19 Time [place]: [id number] Representative spoke with at [place]: [place] Interventions     Readmission Risk Interventions Readmission Risk Prevention Plan 04/04/2019  Transportation Screening Complete  PCP or Specialist Appt within 5-7 Days Complete  Home Care Screening Complete  Medication Review (RN CM) Complete  Some recent data might be hidden

## 2019-04-04 NOTE — Progress Notes (Signed)
Nsg Discharge Note   Admit Date:  03/28/2019 Discharge date: 04/04/2019   [name] to be D/C'd Home per MD order.  AVS completed.  Copy for chart, and copy for patient signed, and dated. Patient/caregiver able to verbalize understanding.  Discharge Medication: Allergies as of 04/04/2019   No Known Allergies     Medication List    STOP taking these medications   aspirin EC 81 MG tablet   Tribenzor 40-5-12.5 MG Tabs Generic drug: Olmesartan-amLODIPine-HCTZ     TAKE these medications   acetaminophen 500 MG tablet Commonly known as: TYLENOL Take 1,000 mg by mouth 2 (two) times daily.   amoxicillin-clavulanate 875-125 MG tablet Commonly known as: AUGMENTIN Take 1 tablet by mouth every 12 (twelve) hours for 3 days.   ASPERCREME LIDOCAINE EX Apply 1 application topically 2 (two) times daily as needed (knee pain).   Biofreeze 4 % Gel Generic drug: Menthol (Topical Analgesic) Apply 1 application topically 2 (two) times daily as needed (knee pain).   DULoxetine 60 MG capsule Commonly known as: CYMBALTA Take 60 mg by mouth daily.   furosemide 40 MG tablet Commonly known as: LASIX Take 40 mg by mouth daily as needed (for fluid retention.).   lubiprostone 24 MCG capsule Commonly known as: AMITIZA Take 24 mcg by mouth daily.   metoprolol tartrate 25 MG tablet Commonly known as: LOPRESSOR Take 2 tablets (50 mg total) by mouth 2 (two) times daily. What changed: medication strength   montelukast 10 MG tablet Commonly known as: SINGULAIR Take 10 mg by mouth daily.   multivitamin with minerals Tabs tablet Take 1 tablet by mouth daily.   ondansetron 4 MG disintegrating tablet Commonly known as: ZOFRAN-ODT Take 4 mg by mouth every 8 (eight) hours as needed for nausea or vomiting.   pantoprazole 40 MG tablet Commonly known as: PROTONIX Take 1 tablet (40 mg total) by mouth 2 (two) times daily.   potassium chloride SA 20 MEQ tablet Commonly known as: K-DUR Take 20 mEq  by mouth at bedtime.   pravastatin 20 MG tablet Commonly known as: PRAVACHOL Take 20 mg by mouth daily.   PRESCRIPTION MEDICATION Inhale into the lungs at bedtime. CPAP   albuterol (2.5 MG/3ML) 0.083% nebulizer solution Commonly known as: PROVENTIL Take 2.5 mg by nebulization every 6 (six) hours as needed for wheezing or shortness of breath.   ProAir HFA 108 (90 Base) MCG/ACT inhaler Generic drug: albuterol Inhale 2 puffs into the lungs every 6 (six) hours as needed for wheezing or shortness of breath.   promethazine 25 MG tablet Commonly known as: PHENERGAN Take 25 mg by mouth every 8 (eight) hours as needed for nausea or vomiting.   senna 8.6 MG Tabs tablet Commonly known as: SENOKOT Take 4 tablets by mouth daily as needed for mild constipation.   traMADol 50 MG tablet Commonly known as: ULTRAM Take 50 mg by mouth 3 (three) times daily as needed for moderate pain.   Trelegy Ellipta 100-62.5-25 MCG/INH Aepb Generic drug: Fluticasone-Umeclidin-Vilant Inhale 1 puff into the lungs daily.   Vitamin D (Ergocalciferol) 1.25 MG (50000 UT) Caps capsule Commonly known as: DRISDOL Take 50,000 Units by mouth every Monday. In the evening.       Discharge Assessment: Vitals:   04/04/19 0805 04/04/19 1214  BP:    Pulse:    Resp:    Temp:  98.8 F (37.1 C)  SpO2: 99%    Skin clean, dry and intact without evidence of skin break down, no  evidence of skin tears noted. IV catheter discontinued intact. Site without signs and symptoms of complications - no redness or edema noted at insertion site, patient denies c/o pain - only slight tenderness at site.  Dressing with slight pressure applied.  D/c Instructions-Education: Discharge instructions given to patient/family with verbalized understanding. D/c education completed with patient/family including follow up instructions, medication list, d/c activities limitations if indicated, with other d/c instructions as indicated by MD -  patient able to verbalize understanding, all questions fully answered. Patient instructed to return to ED, call 911, or call MD for any changes in condition.  Patient escorted via [place], and D/C home via private auto.  Erasmo Leventhal, RN 04/04/2019 2:04 PM

## 2019-04-04 NOTE — Final Consult Note (Signed)
Consultant Final Sign-Off Note    Assessment/Final recommendations  [name] is a 78 y.o. female followed by me for contained perforated gastric ulcer. Patient has improved without surgical intervention. Discussed plans for GI follow up for EGD in 6-8 weeks and that she should remain on medication for the remainder of her life.    Wound care (if applicable): None   Diet at discharge: soft, avoid foods that aggravate peptic ulcer disease   Activity at discharge: per primary team   Follow-up appointment:  GI in 6-8 weeks   Pending results:  Unresulted Labs (From admission, onward)   None       Medication recommendations: continue PPI on discharge BID    Other recommendations: None     Thank you for allowing [place] to participate in the care of your patient!  Please consult [place] again if you have further needs for your patient.  [name] 04/04/2019 9:18 AM    Subjective   Patient denies abdominal pain or nausea. Tolerating FLD. Having bowel function.   Objective  Vital signs in last 24 hours: Temp:  [98.3 F (36.8 C)-98.4 F (36.9 C)] 98.3 F (36.8 C) (09/02 0757) Pulse Rate:  [79-93] 79 (09/02 0800) Resp:  [18-25] 23 (09/02 0800) BP: (95-116)/(46-58) 116/49 (09/02 0800) SpO2:  [94 %-100 %] 99 % (09/02 0805)  PE: [id number] but easily awakened, pleasant Card:RRR Pulm: Normal effort, clear to auscultation bilaterally Abd: Soft,non-tender, no guarding, no peritonitis, non-distended, +BS Skin: warm and dry, no rashes  Psych: A&Ox3   Pertinent labs and Studies: Recent Labs    04/02/19 0434 04/03/19 1137  WBC 8.3 8.5  HGB 8.3* 9.4*  HCT 26.5* 31.0*   BMET Recent Labs    04/02/19 0434 04/03/19 1137  NA 139 137  K 3.1* 2.9*  CL 101 95*  CO2 27 30  GLUCOSE 89 167*  BUN 13 6*  CREATININE 0.82 0.85  CALCIUM 8.3* 8.5*   No results for input(s): LABURIN in the last 72 hours. Results for orders placed or performed during the hospital  encounter of 03/28/19  SARS CORONAVIRUS 2 (TAT 6-12 HRS) Nasal Swab Aptima Multi Swab     Status: None   Collection Time: 03/28/19  8:41 AM   Specimen: Aptima Multi Swab; Nasal Swab  Result Value Ref Range Status   SARS Coronavirus 2 NEGATIVE NEGATIVE Final    Comment: (NOTE) SARS-CoV-2 target nucleic acids are NOT DETECTED. The SARS-CoV-2 RNA is generally detectable in upper and lower respiratory specimens during the acute phase of infection. Negative results do not preclude SARS-CoV-2 infection, do not rule out co-infections with other pathogens, and should not be used as the sole basis for treatment or other patient management decisions. Negative results must be combined with clinical observations, patient history, and epidemiological information. The expected result is Negative. Fact Sheet for Patients: SugarRoll.be Fact Sheet for Healthcare Providers: https://www.woods-mathews.com/ This test is not yet approved or cleared by the [place] FDA and  has been authorized for detection and/or diagnosis of SARS-CoV-2 by FDA under an Emergency Use Authorization (EUA). This EUA will remain  in effect (meaning this test can be used) for the duration of the COVID-19 declaration under Section 56 4(b)(1) of the Act, 21 U.S.C. section 360bbb-3(b)(1), unless the authorization is terminated or revoked sooner. Performed at [facility], [place] [address] [place], [place] [address]   Urine culture     Status: Abnormal   Collection Time: 03/28/19  8:58 AM  Specimen: Urine, Random  Result Value Ref Range Status   Specimen Description URINE, RANDOM  Final   Special Requests   Final    NONE Performed at [facility], [address] [place], [place] [address]    Culture >=100,000 COLONIES/mL ESCHERICHIA COLI (A)  Final   Report Status 03/30/2019 FINAL  Final   Organism ID, Bacteria ESCHERICHIA COLI (A)  Final      Susceptibility    Escherichia coli - MIC*    AMPICILLIN <=2 SENSITIVE Sensitive     CEFAZOLIN <=4 SENSITIVE Sensitive     CEFTRIAXONE <=1 SENSITIVE Sensitive     CIPROFLOXACIN <=0.25 SENSITIVE Sensitive     GENTAMICIN <=1 SENSITIVE Sensitive     IMIPENEM <=0.25 SENSITIVE Sensitive     NITROFURANTOIN 32 SENSITIVE Sensitive     TRIMETH/SULFA <=20 SENSITIVE Sensitive     AMPICILLIN/SULBACTAM <=2 SENSITIVE Sensitive     PIP/TAZO <=4 SENSITIVE Sensitive     Extended ESBL NEGATIVE Sensitive     * >=100,000 COLONIES/mL ESCHERICHIA COLI  Culture, blood (routine x 2)     Status: None   Collection Time: 03/28/19  2:40 PM   Specimen: BLOOD  Result Value Ref Range Status   Specimen Description BLOOD RIGHT ANTECUBITAL  Final   Special Requests   Final    BOTTLES DRAWN AEROBIC ONLY Blood Culture adequate volume   Culture   Final    NO GROWTH 5 DAYS Performed at [facility], [address] [place], [place] [address]    Report Status 04/02/2019 FINAL  Final  Culture, blood (routine x 2)     Status: None   Collection Time: 03/28/19  2:46 PM   Specimen: BLOOD RIGHT HAND  Result Value Ref Range Status   Specimen Description BLOOD RIGHT HAND  Final   Special Requests   Final    BOTTLES DRAWN AEROBIC ONLY Blood Culture adequate volume   Culture   Final    NO GROWTH 5 DAYS Performed at [facility], [place] [address] [place], [place] [address]    Report Status 04/02/2019 FINAL  Final  MRSA PCR Screening     Status: None   Collection Time: 03/30/19  9:37 AM   Specimen: Nasopharyngeal  Result Value Ref Range Status   MRSA by PCR NEGATIVE NEGATIVE Final    Comment:        The GeneXpert MRSA Assay (FDA approved for NASAL specimens only), is one component of a comprehensive MRSA colonization surveillance program. It is not intended to diagnose MRSA infection nor to guide or monitor treatment for MRSA infections. Performed at [facility], [place] [address] [place], [place] [address]      Imaging: No results found.

## 2019-04-06 DIAGNOSIS — J449 Chronic obstructive pulmonary disease, unspecified: Secondary | ICD-10-CM | POA: Diagnosis not present

## 2019-04-06 DIAGNOSIS — M160 Bilateral primary osteoarthritis of hip: Secondary | ICD-10-CM | POA: Diagnosis not present

## 2019-04-06 DIAGNOSIS — K8020 Calculus of gallbladder without cholecystitis without obstruction: Secondary | ICD-10-CM | POA: Diagnosis not present

## 2019-04-06 DIAGNOSIS — K256 Chronic or unspecified gastric ulcer with both hemorrhage and perforation: Secondary | ICD-10-CM | POA: Diagnosis not present

## 2019-04-06 DIAGNOSIS — M4855XD Collapsed vertebra, not elsewhere classified, thoracolumbar region, subsequent encounter for fracture with routine healing: Secondary | ICD-10-CM | POA: Diagnosis not present

## 2019-04-06 DIAGNOSIS — K5730 Diverticulosis of large intestine without perforation or abscess without bleeding: Secondary | ICD-10-CM | POA: Diagnosis not present

## 2019-04-06 DIAGNOSIS — E871 Hypo-osmolality and hyponatremia: Secondary | ICD-10-CM | POA: Diagnosis not present

## 2019-04-06 DIAGNOSIS — H5509 Other forms of nystagmus: Secondary | ICD-10-CM | POA: Diagnosis not present

## 2019-04-06 DIAGNOSIS — I959 Hypotension, unspecified: Secondary | ICD-10-CM | POA: Diagnosis not present

## 2019-04-06 DIAGNOSIS — G4733 Obstructive sleep apnea (adult) (pediatric): Secondary | ICD-10-CM | POA: Diagnosis not present

## 2019-04-06 DIAGNOSIS — M899 Disorder of bone, unspecified: Secondary | ICD-10-CM | POA: Diagnosis not present

## 2019-04-06 DIAGNOSIS — I110 Hypertensive heart disease with heart failure: Secondary | ICD-10-CM | POA: Diagnosis not present

## 2019-04-06 DIAGNOSIS — I50814 Right heart failure due to left heart failure: Secondary | ICD-10-CM | POA: Diagnosis not present

## 2019-04-06 DIAGNOSIS — G4731 Primary central sleep apnea: Secondary | ICD-10-CM | POA: Diagnosis not present

## 2019-04-06 DIAGNOSIS — I700 Atherosclerosis of aorta: Secondary | ICD-10-CM | POA: Diagnosis not present

## 2019-04-06 DIAGNOSIS — N390 Urinary tract infection, site not specified: Secondary | ICD-10-CM | POA: Diagnosis not present

## 2019-04-06 DIAGNOSIS — C8590 Non-Hodgkin lymphoma, unspecified, unspecified site: Secondary | ICD-10-CM | POA: Diagnosis not present

## 2019-04-06 DIAGNOSIS — N281 Cyst of kidney, acquired: Secondary | ICD-10-CM | POA: Diagnosis not present

## 2019-04-06 DIAGNOSIS — M810 Age-related osteoporosis without current pathological fracture: Secondary | ICD-10-CM | POA: Diagnosis not present

## 2019-04-06 DIAGNOSIS — J4540 Moderate persistent asthma, uncomplicated: Secondary | ICD-10-CM | POA: Diagnosis not present

## 2019-04-06 DIAGNOSIS — I471 Supraventricular tachycardia: Secondary | ICD-10-CM | POA: Diagnosis not present

## 2019-04-06 DIAGNOSIS — G8929 Other chronic pain: Secondary | ICD-10-CM | POA: Diagnosis not present

## 2019-04-06 DIAGNOSIS — T182XXD Foreign body in stomach, subsequent encounter: Secondary | ICD-10-CM | POA: Diagnosis not present

## 2019-04-06 DIAGNOSIS — M8588 Other specified disorders of bone density and structure, other site: Secondary | ICD-10-CM | POA: Diagnosis not present

## 2019-04-06 DIAGNOSIS — F329 Major depressive disorder, single episode, unspecified: Secondary | ICD-10-CM | POA: Diagnosis not present

## 2019-04-07 DIAGNOSIS — J449 Chronic obstructive pulmonary disease, unspecified: Secondary | ICD-10-CM | POA: Diagnosis not present

## 2019-04-07 DIAGNOSIS — T182XXD Foreign body in stomach, subsequent encounter: Secondary | ICD-10-CM | POA: Diagnosis not present

## 2019-04-07 DIAGNOSIS — I959 Hypotension, unspecified: Secondary | ICD-10-CM | POA: Diagnosis not present

## 2019-04-07 DIAGNOSIS — K256 Chronic or unspecified gastric ulcer with both hemorrhage and perforation: Secondary | ICD-10-CM | POA: Diagnosis not present

## 2019-04-07 DIAGNOSIS — E871 Hypo-osmolality and hyponatremia: Secondary | ICD-10-CM | POA: Diagnosis not present

## 2019-04-07 DIAGNOSIS — N390 Urinary tract infection, site not specified: Secondary | ICD-10-CM | POA: Diagnosis not present

## 2019-04-11 DIAGNOSIS — R571 Hypovolemic shock: Secondary | ICD-10-CM | POA: Diagnosis not present

## 2019-04-11 DIAGNOSIS — J449 Chronic obstructive pulmonary disease, unspecified: Secondary | ICD-10-CM | POA: Diagnosis not present

## 2019-04-11 DIAGNOSIS — I959 Hypotension, unspecified: Secondary | ICD-10-CM | POA: Diagnosis not present

## 2019-04-11 DIAGNOSIS — I10 Essential (primary) hypertension: Secondary | ICD-10-CM | POA: Diagnosis not present

## 2019-04-11 DIAGNOSIS — K922 Gastrointestinal hemorrhage, unspecified: Secondary | ICD-10-CM | POA: Diagnosis not present

## 2019-04-11 DIAGNOSIS — G4733 Obstructive sleep apnea (adult) (pediatric): Secondary | ICD-10-CM | POA: Diagnosis not present

## 2019-04-11 DIAGNOSIS — K256 Chronic or unspecified gastric ulcer with both hemorrhage and perforation: Secondary | ICD-10-CM | POA: Diagnosis not present

## 2019-04-11 DIAGNOSIS — I4891 Unspecified atrial fibrillation: Secondary | ICD-10-CM | POA: Diagnosis not present

## 2019-04-11 DIAGNOSIS — E876 Hypokalemia: Secondary | ICD-10-CM | POA: Diagnosis not present

## 2019-04-11 DIAGNOSIS — T182XXD Foreign body in stomach, subsequent encounter: Secondary | ICD-10-CM | POA: Diagnosis not present

## 2019-04-11 DIAGNOSIS — R6521 Severe sepsis with septic shock: Secondary | ICD-10-CM | POA: Diagnosis not present

## 2019-04-11 DIAGNOSIS — N390 Urinary tract infection, site not specified: Secondary | ICD-10-CM | POA: Diagnosis not present

## 2019-04-11 DIAGNOSIS — R601 Generalized edema: Secondary | ICD-10-CM | POA: Diagnosis not present

## 2019-04-11 DIAGNOSIS — E871 Hypo-osmolality and hyponatremia: Secondary | ICD-10-CM | POA: Diagnosis not present

## 2019-04-11 DIAGNOSIS — K255 Chronic or unspecified gastric ulcer with perforation: Secondary | ICD-10-CM | POA: Diagnosis not present

## 2019-04-12 ENCOUNTER — Telehealth: Payer: Self-pay | Admitting: Gastroenterology

## 2019-04-12 DIAGNOSIS — E876 Hypokalemia: Secondary | ICD-10-CM | POA: Diagnosis not present

## 2019-04-12 DIAGNOSIS — N390 Urinary tract infection, site not specified: Secondary | ICD-10-CM | POA: Diagnosis not present

## 2019-04-12 DIAGNOSIS — J449 Chronic obstructive pulmonary disease, unspecified: Secondary | ICD-10-CM | POA: Diagnosis not present

## 2019-04-12 DIAGNOSIS — I959 Hypotension, unspecified: Secondary | ICD-10-CM | POA: Diagnosis not present

## 2019-04-12 DIAGNOSIS — T182XXD Foreign body in stomach, subsequent encounter: Secondary | ICD-10-CM | POA: Diagnosis not present

## 2019-04-12 DIAGNOSIS — E871 Hypo-osmolality and hyponatremia: Secondary | ICD-10-CM | POA: Diagnosis not present

## 2019-04-12 DIAGNOSIS — Z23 Encounter for immunization: Secondary | ICD-10-CM | POA: Diagnosis not present

## 2019-04-12 DIAGNOSIS — K256 Chronic or unspecified gastric ulcer with both hemorrhage and perforation: Secondary | ICD-10-CM | POA: Diagnosis not present

## 2019-04-12 NOTE — Telephone Encounter (Signed)
This patient was admitted on 8/26 for a GI bleed/perforation, was told to have a f/u EGD in 6-8 weeks. Has not been seen in the office since 04/2017. The daughter did not want her mother to have an OV, she only wanted then repeat EGD. The daughter was told that since the patient has not been seen in the office in 2 years, Dr. Loletha Carrow would more than likely want to see her in the office. She asked if it was possible if she could have a virtual visit. The patient's daughter was told we more than likely would not be able to have a virtual visit since Dr. Loletha Carrow may want to see the patient in person. She wanted to ask Dr. Loletha Carrow if the visit could be done virtuall. Patient has been tentatively scheduled for an OV with Dr. Loletha Carrow on 10/14 at 3:40 pm. Please advise.

## 2019-04-16 DIAGNOSIS — J449 Chronic obstructive pulmonary disease, unspecified: Secondary | ICD-10-CM | POA: Diagnosis not present

## 2019-04-16 DIAGNOSIS — N390 Urinary tract infection, site not specified: Secondary | ICD-10-CM | POA: Diagnosis not present

## 2019-04-16 DIAGNOSIS — E871 Hypo-osmolality and hyponatremia: Secondary | ICD-10-CM | POA: Diagnosis not present

## 2019-04-16 DIAGNOSIS — T182XXD Foreign body in stomach, subsequent encounter: Secondary | ICD-10-CM | POA: Diagnosis not present

## 2019-04-16 DIAGNOSIS — K256 Chronic or unspecified gastric ulcer with both hemorrhage and perforation: Secondary | ICD-10-CM | POA: Diagnosis not present

## 2019-04-16 DIAGNOSIS — I959 Hypotension, unspecified: Secondary | ICD-10-CM | POA: Diagnosis not present

## 2019-04-16 NOTE — Telephone Encounter (Signed)
Spoke to daughter who is aware that this appointment will be an in office visit.

## 2019-04-16 NOTE — Telephone Encounter (Signed)
I reviewed records of the recent hospital stay.    She has multiple chronic medical conditions and a complex recent hospitalization.  In-person office visit needed for comprehensive evaluation prior to planning repeat EGD.  That date and time are fine.

## 2019-04-18 DIAGNOSIS — E871 Hypo-osmolality and hyponatremia: Secondary | ICD-10-CM | POA: Diagnosis not present

## 2019-04-18 DIAGNOSIS — J449 Chronic obstructive pulmonary disease, unspecified: Secondary | ICD-10-CM | POA: Diagnosis not present

## 2019-04-18 DIAGNOSIS — N390 Urinary tract infection, site not specified: Secondary | ICD-10-CM | POA: Diagnosis not present

## 2019-04-18 DIAGNOSIS — I959 Hypotension, unspecified: Secondary | ICD-10-CM | POA: Diagnosis not present

## 2019-04-18 DIAGNOSIS — T182XXD Foreign body in stomach, subsequent encounter: Secondary | ICD-10-CM | POA: Diagnosis not present

## 2019-04-18 DIAGNOSIS — K256 Chronic or unspecified gastric ulcer with both hemorrhage and perforation: Secondary | ICD-10-CM | POA: Diagnosis not present

## 2019-04-20 DIAGNOSIS — I959 Hypotension, unspecified: Secondary | ICD-10-CM | POA: Diagnosis not present

## 2019-04-20 DIAGNOSIS — E871 Hypo-osmolality and hyponatremia: Secondary | ICD-10-CM | POA: Diagnosis not present

## 2019-04-20 DIAGNOSIS — J449 Chronic obstructive pulmonary disease, unspecified: Secondary | ICD-10-CM | POA: Diagnosis not present

## 2019-04-20 DIAGNOSIS — T182XXD Foreign body in stomach, subsequent encounter: Secondary | ICD-10-CM | POA: Diagnosis not present

## 2019-04-20 DIAGNOSIS — N390 Urinary tract infection, site not specified: Secondary | ICD-10-CM | POA: Diagnosis not present

## 2019-04-20 DIAGNOSIS — K256 Chronic or unspecified gastric ulcer with both hemorrhage and perforation: Secondary | ICD-10-CM | POA: Diagnosis not present

## 2019-04-23 DIAGNOSIS — I959 Hypotension, unspecified: Secondary | ICD-10-CM | POA: Diagnosis not present

## 2019-04-23 DIAGNOSIS — N390 Urinary tract infection, site not specified: Secondary | ICD-10-CM | POA: Diagnosis not present

## 2019-04-23 DIAGNOSIS — E871 Hypo-osmolality and hyponatremia: Secondary | ICD-10-CM | POA: Diagnosis not present

## 2019-04-23 DIAGNOSIS — T182XXD Foreign body in stomach, subsequent encounter: Secondary | ICD-10-CM | POA: Diagnosis not present

## 2019-04-23 DIAGNOSIS — J449 Chronic obstructive pulmonary disease, unspecified: Secondary | ICD-10-CM | POA: Diagnosis not present

## 2019-04-23 DIAGNOSIS — K256 Chronic or unspecified gastric ulcer with both hemorrhage and perforation: Secondary | ICD-10-CM | POA: Diagnosis not present

## 2019-04-26 DIAGNOSIS — I959 Hypotension, unspecified: Secondary | ICD-10-CM | POA: Diagnosis not present

## 2019-04-26 DIAGNOSIS — T182XXD Foreign body in stomach, subsequent encounter: Secondary | ICD-10-CM | POA: Diagnosis not present

## 2019-04-26 DIAGNOSIS — K256 Chronic or unspecified gastric ulcer with both hemorrhage and perforation: Secondary | ICD-10-CM | POA: Diagnosis not present

## 2019-04-26 DIAGNOSIS — E871 Hypo-osmolality and hyponatremia: Secondary | ICD-10-CM | POA: Diagnosis not present

## 2019-04-26 DIAGNOSIS — N390 Urinary tract infection, site not specified: Secondary | ICD-10-CM | POA: Diagnosis not present

## 2019-04-26 DIAGNOSIS — J449 Chronic obstructive pulmonary disease, unspecified: Secondary | ICD-10-CM | POA: Diagnosis not present

## 2019-05-02 DIAGNOSIS — E871 Hypo-osmolality and hyponatremia: Secondary | ICD-10-CM | POA: Diagnosis not present

## 2019-05-02 DIAGNOSIS — T182XXD Foreign body in stomach, subsequent encounter: Secondary | ICD-10-CM | POA: Diagnosis not present

## 2019-05-02 DIAGNOSIS — N390 Urinary tract infection, site not specified: Secondary | ICD-10-CM | POA: Diagnosis not present

## 2019-05-02 DIAGNOSIS — K256 Chronic or unspecified gastric ulcer with both hemorrhage and perforation: Secondary | ICD-10-CM | POA: Diagnosis not present

## 2019-05-02 DIAGNOSIS — J449 Chronic obstructive pulmonary disease, unspecified: Secondary | ICD-10-CM | POA: Diagnosis not present

## 2019-05-02 DIAGNOSIS — I959 Hypotension, unspecified: Secondary | ICD-10-CM | POA: Diagnosis not present

## 2019-05-06 DIAGNOSIS — H5509 Other forms of nystagmus: Secondary | ICD-10-CM | POA: Diagnosis not present

## 2019-05-06 DIAGNOSIS — E871 Hypo-osmolality and hyponatremia: Secondary | ICD-10-CM | POA: Diagnosis not present

## 2019-05-06 DIAGNOSIS — N281 Cyst of kidney, acquired: Secondary | ICD-10-CM | POA: Diagnosis not present

## 2019-05-06 DIAGNOSIS — I959 Hypotension, unspecified: Secondary | ICD-10-CM | POA: Diagnosis not present

## 2019-05-06 DIAGNOSIS — M8588 Other specified disorders of bone density and structure, other site: Secondary | ICD-10-CM | POA: Diagnosis not present

## 2019-05-06 DIAGNOSIS — M810 Age-related osteoporosis without current pathological fracture: Secondary | ICD-10-CM | POA: Diagnosis not present

## 2019-05-06 DIAGNOSIS — K256 Chronic or unspecified gastric ulcer with both hemorrhage and perforation: Secondary | ICD-10-CM | POA: Diagnosis not present

## 2019-05-06 DIAGNOSIS — I700 Atherosclerosis of aorta: Secondary | ICD-10-CM | POA: Diagnosis not present

## 2019-05-06 DIAGNOSIS — K5730 Diverticulosis of large intestine without perforation or abscess without bleeding: Secondary | ICD-10-CM | POA: Diagnosis not present

## 2019-05-06 DIAGNOSIS — J4540 Moderate persistent asthma, uncomplicated: Secondary | ICD-10-CM | POA: Diagnosis not present

## 2019-05-06 DIAGNOSIS — I110 Hypertensive heart disease with heart failure: Secondary | ICD-10-CM | POA: Diagnosis not present

## 2019-05-06 DIAGNOSIS — I50814 Right heart failure due to left heart failure: Secondary | ICD-10-CM | POA: Diagnosis not present

## 2019-05-06 DIAGNOSIS — I471 Supraventricular tachycardia: Secondary | ICD-10-CM | POA: Diagnosis not present

## 2019-05-06 DIAGNOSIS — M899 Disorder of bone, unspecified: Secondary | ICD-10-CM | POA: Diagnosis not present

## 2019-05-06 DIAGNOSIS — M160 Bilateral primary osteoarthritis of hip: Secondary | ICD-10-CM | POA: Diagnosis not present

## 2019-05-06 DIAGNOSIS — G4733 Obstructive sleep apnea (adult) (pediatric): Secondary | ICD-10-CM | POA: Diagnosis not present

## 2019-05-06 DIAGNOSIS — M4855XD Collapsed vertebra, not elsewhere classified, thoracolumbar region, subsequent encounter for fracture with routine healing: Secondary | ICD-10-CM | POA: Diagnosis not present

## 2019-05-06 DIAGNOSIS — F329 Major depressive disorder, single episode, unspecified: Secondary | ICD-10-CM | POA: Diagnosis not present

## 2019-05-06 DIAGNOSIS — G8929 Other chronic pain: Secondary | ICD-10-CM | POA: Diagnosis not present

## 2019-05-06 DIAGNOSIS — C8590 Non-Hodgkin lymphoma, unspecified, unspecified site: Secondary | ICD-10-CM | POA: Diagnosis not present

## 2019-05-06 DIAGNOSIS — K8020 Calculus of gallbladder without cholecystitis without obstruction: Secondary | ICD-10-CM | POA: Diagnosis not present

## 2019-05-06 DIAGNOSIS — T182XXD Foreign body in stomach, subsequent encounter: Secondary | ICD-10-CM | POA: Diagnosis not present

## 2019-05-06 DIAGNOSIS — J449 Chronic obstructive pulmonary disease, unspecified: Secondary | ICD-10-CM | POA: Diagnosis not present

## 2019-05-06 DIAGNOSIS — G4731 Primary central sleep apnea: Secondary | ICD-10-CM | POA: Diagnosis not present

## 2019-05-06 DIAGNOSIS — N390 Urinary tract infection, site not specified: Secondary | ICD-10-CM | POA: Diagnosis not present

## 2019-05-08 DIAGNOSIS — K256 Chronic or unspecified gastric ulcer with both hemorrhage and perforation: Secondary | ICD-10-CM | POA: Diagnosis not present

## 2019-05-08 DIAGNOSIS — E871 Hypo-osmolality and hyponatremia: Secondary | ICD-10-CM | POA: Diagnosis not present

## 2019-05-08 DIAGNOSIS — N390 Urinary tract infection, site not specified: Secondary | ICD-10-CM | POA: Diagnosis not present

## 2019-05-08 DIAGNOSIS — I959 Hypotension, unspecified: Secondary | ICD-10-CM | POA: Diagnosis not present

## 2019-05-08 DIAGNOSIS — J449 Chronic obstructive pulmonary disease, unspecified: Secondary | ICD-10-CM | POA: Diagnosis not present

## 2019-05-08 DIAGNOSIS — T182XXD Foreign body in stomach, subsequent encounter: Secondary | ICD-10-CM | POA: Diagnosis not present

## 2019-05-16 ENCOUNTER — Other Ambulatory Visit: Payer: Self-pay

## 2019-05-16 ENCOUNTER — Ambulatory Visit (INDEPENDENT_AMBULATORY_CARE_PROVIDER_SITE_OTHER): Payer: Medicare Other | Admitting: Gastroenterology

## 2019-05-16 ENCOUNTER — Encounter: Payer: Self-pay | Admitting: Gastroenterology

## 2019-05-16 VITALS — BP 116/84 | HR 76 | Temp 97.8°F | Ht 60.0 in | Wt 278.8 lb

## 2019-05-16 DIAGNOSIS — K275 Chronic or unspecified peptic ulcer, site unspecified, with perforation: Secondary | ICD-10-CM | POA: Diagnosis not present

## 2019-05-16 DIAGNOSIS — D62 Acute posthemorrhagic anemia: Secondary | ICD-10-CM | POA: Diagnosis not present

## 2019-05-16 NOTE — Progress Notes (Signed)
[facility] Progress Note  Chief Complaint: Gastric ulcer  Subjective  History: Seen in office 2018 after hospitalization for perforation of gastroduodenal ulcer treated surgically with a patch and drainage.  It was believed to have been from chronic use of 81 mg aspirin.  A subsequent negative H. pylori IgG antibody, and upper endoscopy January 2019 showed healed ulcer.  It was recommended the patient remain off aspirin.  She was admitted to our hospital on August 26 with upper abdominal pain hematemesis, shock, and was discovered to have a contained perforation of either gastric or duodenal ulcer.  She was managed in the ICU, received NG tube drainage, antibiotics bowel rest and Protonix.  Surgical and GI consultations were obtained, fortunately she did not need surgery.  She was discharged home with a request to follow-up with [place] and pursue repeat upper endoscopy to confirm ulcer healing. Patient had been on aspirin at the time of admission.  [name] is here with her daughter today.  She seems to have completely recovered from the hospitalization.  There was residual epigastric pain for about a week afterwards that finally resolved.  Her appetite is returned to normal, she denies nausea, vomiting, hematemesis or melena.  ROS: Cardiovascular:  no chest pain Respiratory: Chronic dyspnea with exertion Difficulty with ambulation due to arthritis in her knees.  She is unable to get on the exam table today as a result Mood is stable Remainder of systems negative except as above The patient's Past Medical, Family and Social History were reviewed and are on file in the EMR. Past Medical History:  Diagnosis Date  . Anxiety   . Arthritis   . Asthma    hx of  . Back pain   . Chronic constipation   . Depression    hx of  . Dyspnea   . Family history of adverse reaction to anesthesia    DAD bp ELEVATED   . GERD (gastroesophageal reflux disease)   . High cholesterol   . Hypertension    . Lymphoma ([place])   . Obesity   . Sleep apnea    uses a CPAP  . Stroke ([place])    2010  . SVT (supraventricular tachycardia) ([facility])     Objective:  Med list reviewed  Current Outpatient Medications:  .  acetaminophen (TYLENOL) 500 MG tablet, Take 1,000 mg by mouth 2 (two) times daily. , Disp: , Rfl:  .  albuterol (PROAIR HFA) 108 (90 Base) MCG/ACT inhaler, Inhale 2 puffs into the lungs every 6 (six) hours as needed for wheezing or shortness of breath. , Disp: , Rfl:  .  albuterol (PROVENTIL) (2.5 MG/3ML) 0.083% nebulizer solution, Take 2.5 mg by nebulization every 6 (six) hours as needed for wheezing or shortness of breath., Disp: , Rfl:  .  ASPERCREME LIDOCAINE EX, Apply 1 application topically 2 (two) times daily as needed (knee pain). , Disp: , Rfl:  .  DULoxetine (CYMBALTA) 60 MG capsule, Take 60 mg by mouth daily., Disp: , Rfl:  .  Fluticasone-Umeclidin-Vilant (TRELEGY ELLIPTA) 100-62.5-25 MCG/INH AEPB, Inhale 1 puff into the lungs daily., Disp: , Rfl:  .  furosemide (LASIX) 40 MG tablet, Take 40 mg by mouth daily as needed (for fluid retention.). , Disp: , Rfl:  .  lubiprostone (AMITIZA) 24 MCG capsule, Take 24 mcg by mouth daily. , Disp: , Rfl:  .  Menthol, Topical Analgesic, (BIOFREEZE) 4 % GEL, Apply 1 application topically 2 (two) times daily as needed (knee pain). ,  Disp: , Rfl:  .  montelukast (SINGULAIR) 10 MG tablet, Take 10 mg by mouth daily. , Disp: , Rfl:  .  Multiple Vitamin (MULTIVITAMIN WITH MINERALS) TABS tablet, Take 1 tablet by mouth daily., Disp: , Rfl:  .  ondansetron (ZOFRAN-ODT) 4 MG disintegrating tablet, Take 4 mg by mouth every 8 (eight) hours as needed for nausea or vomiting., Disp: , Rfl:  .  potassium chloride SA (K-DUR,KLOR-CON) 20 MEQ tablet, Take 20 mEq by mouth at bedtime. , Disp: , Rfl:  .  pravastatin (PRAVACHOL) 20 MG tablet, Take 20 mg by mouth daily.  , Disp: , Rfl:  .  PRESCRIPTION MEDICATION, Inhale into the lungs at bedtime. CPAP, Disp: , Rfl:   .  promethazine (PHENERGAN) 25 MG tablet, Take 25 mg by mouth every 8 (eight) hours as needed for nausea or vomiting., Disp: , Rfl:  .  senna (SENOKOT) 8.6 MG TABS tablet, Take 4 tablets by mouth daily as needed for mild constipation. , Disp: , Rfl:  .  traMADol (ULTRAM) 50 MG tablet, Take 50 mg by mouth 3 (three) times daily as needed for moderate pain. , Disp: , Rfl:  .  Vitamin D, Ergocalciferol, (DRISDOL) 50000 UNITS CAPS, Take 50,000 Units by mouth every Monday. In the evening., Disp: , Rfl:  .  metoprolol tartrate (LOPRESSOR) 25 MG tablet, Take 2 tablets (50 mg total) by mouth 2 (two) times daily., Disp: 120 tablet, Rfl: 0 .  pantoprazole (PROTONIX) 40 MG tablet, Take 1 tablet (40 mg total) by mouth 2 (two) times daily., Disp: 60 tablet, Rfl: 0   Vital signs in last 24 hrs: Vitals:   05/16/19 1549  BP: 116/84  Pulse: 76  Temp: 97.8 F (36.6 C)    Physical Exam  Alert, pleasant, conversational, not acutely ill-appearing  HEENT: sclera anicteric, oral mucosa moist without lesions  Neck: supple, no thyromegaly, JVD or lymphadenopathy  Cardiac: RRR with soft systolic murmur, +  peripheral edema  Pulm: clear to auscultation bilaterally, normal RR and effort noted  Abdomen: soft, morbidly obese, no tenderness, with active bowel sounds. No guarding or palpable hepatosplenomegaly (exam somewhat limited while in chair)  Skin; warm and dry, no jaundice or rash  Recent Labs:  CBC Latest Ref Rng & Units 04/03/2019 04/02/2019 04/01/2019  WBC 4.0 - 10.5 K/uL 8.5 8.3 8.3  Hemoglobin 12.0 - 15.0 g/dL 9.4(L) 8.3(L) 8.3(L)  Hematocrit 36.0 - 46.0 % 31.0(L) 26.5(L) 26.7(L)  Platelets 150 - 400 K/uL 463(H) 356 317   CMP Latest Ref Rng & Units 04/04/2019 04/03/2019 04/02/2019  Glucose 70 - 99 mg/dL 168(H) 167(H) 89  BUN 8 - 23 mg/dL <5(L) 6(L) 13  Creatinine 0.44 - 1.00 mg/dL 0.71 0.85 0.82  Sodium 135 - 145 mmol/L 140 137 139  Potassium 3.5 - 5.1 mmol/L 3.7 2.9(L) 3.1(L)  Chloride 98 - 111  mmol/L 101 95(L) 101  CO2 22 - 32 mmol/L 27 30 27   Calcium 8.9 - 10.3 mg/dL 8.6(L) 8.5(L) 8.3(L)  Total Protein 6.5 - 8.1 g/dL - - -  Total Bilirubin 0.3 - 1.2 mg/dL - - -  Alkaline Phos 38 - 126 U/L - - -  AST 15 - 41 U/L - - -  ALT 0 - 44 U/L - - -    Radiologic studies:  CLINICAL DATA:  78 year old female with abdominal pain, nausea and vomiting since last night.   EXAM: CT ABDOMEN AND PELVIS WITH CONTRAST   TECHNIQUE: Multidetector CT imaging of the abdomen and pelvis  was performed using the standard protocol following bolus administration of intravenous contrast.   CONTRAST:  153mL ISOVUE-300 IOPAMIDOL (ISOVUE-300) INJECTION 61%   COMPARISON:  CT Abdomen and Pelvis 01/31/2017.   FINDINGS: Lower chest: Lung bases have not significantly changed with chronic lower lobe atelectasis or scarring. There is superimposed mosaic attenuation today, probably gas trapping. Stable mild cardiomegaly. No pericardial or pleural effusion.   Hepatobiliary: 19 millimeter gallstones. No pericholecystic inflammation. Normal liver enhancement. No bile duct enlargement.   Pancreas: Negative.   Spleen: Negative.   Adrenals/Urinary Tract: Negative adrenal glands. Stable kidneys. Bilateral renal enhancement and contrast excretion is symmetric and normal. Small left renal parapelvic cysts. Unremarkable ureter.   Stomach/Bowel: Negative rectum aside from retained stool. Small chronic calcified perirectal lymph node is unchanged. Mild diverticulosis in the sigmoid colon, no active inflammation. Negative descending colon. Redundant but otherwise negative transverse colon. Negative right colon. Diminutive appendix redemonstrated. Negative terminal ileum. No dilated small bowel.   There is confluent inflammatory stranding along the distal stomach with trace pneumoperitoneum in the adjacent mesentery on series 4, image 40. This is a similar appearance to that in 2018. The proximal duodenum  is relatively spared. There is no discrete gastric lesion. No other free air. Small phlegmon like nodularity in the adjacent mesentery up to 2 centimeters. Retained fluid in the proximal stomach. No free fluid.   Vascular/Lymphatic: Aortoiliac calcified atherosclerosis. Major arterial structures are patent. Portal venous system is patent.   Reproductive: Surgically absent uterus. Diminutive or absent ovaries.   Other: No pelvic free fluid.   Musculoskeletal: Widespread thoracolumbar compression fractures and multilevel prior vertebral augmentation. Chronic posterior spinal rods. Osteopenia. Degenerative changes also at the hips and pubic symphysis. No acute osseous abnormality identified.   IMPRESSION: 1. Inflammation and trace pneumoperitoneum along the distal stomach, similar to the abnormal CT in 2018, compatible with contained Perforated Peptic Ulcer. Today the duodenum appears relatively spared. No discrete gastric lesion. No free fluid. 2. No other acute findings. Chronic findings include: Lung base scarring, cholelithiasis, osteopenia with spinal compression fractures, aortic Atherosclerosis (ICD10-I70.0).   Electronically Signed: By: Genevie Ann M.D. On: 03/28/2019 08:14   @ASSESSMENTPLANBEGIN @ Assessment: Encounter Diagnoses  Name Primary?  . Peptic ulcer with perforation ([place]) Yes  . Morbid obesity ([place])   . 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  She should never go back on aspirin or any NSAIDs.   Plan: Upper endoscopy to confirm ulcer healing.  She was agreeable after discussion of procedure and risks.  The benefits and risks of the planned procedure were described in detail with the patient or (when appropriate) their health care proxy.  Risks were outlined as including, but not limited to, bleeding, infection, perforation, adverse medication reaction leading to cardiac or pulmonary decompensation,  pancreatitis (if ERCP).  The limitation of incomplete mucosal visualization was also discussed.  No guarantees or warranties were given.  Must be done in hospital endoscopy lab due to increased risks from her medical issues and BMI.  Continue PPI at least until procedure  [name]

## 2019-05-16 NOTE — Patient Instructions (Addendum)
If you are age 79 or older, your body mass index should be between 23-30. Your Body mass index is 54.45 kg/m. If this is out of the aforementioned range listed, please consider follow up with your Primary Care Provider.  If you are age 66 or younger, your body mass index should be between 19-25. Your Body mass index is 54.45 kg/m. If this is out of the aformentioned range listed, please consider follow up with your Primary Care Provider.   You have been scheduled for an endoscopy. Please follow written instructions given to you at your visit today. If you use inhalers (even only as needed), please bring them with you on the day of your procedure.  Due to recent COVID-19 restrictions implemented by our local and state authorities and in an effort to keep both patients and staff as safe as possible, our hospital system now requires COVID-19 testing prior to any scheduled hospital procedure. Please go to our [facility] location drive thru testing site ([address] [place], [place] [address]) on 05-21-2019 at  1040am. There will be multiple testing areas, the first checkpoint being for pre-procedure/surgery testing. Get into the right (yellow) lane that leads to the PAT testing team. You will not be billed at the time of testing but may receive a bill later depending on your insurance. The approximate cost of the test is $100. You must agree to quarantine from the time of your testing until the procedure date on 05-24-2019 . This should include staying at home with ONLY the people you live with. Avoid take-out, grocery store shopping or leaving the house for any non-emergent reason. Failure to have your COVID-19 test done on the date and time you have been scheduled will result in cancellation of procedure. Please call our office at [phone] if you have any questions.    It was a pleasure to see you today!  Dr. Loletha Carrow

## 2019-05-17 DIAGNOSIS — K256 Chronic or unspecified gastric ulcer with both hemorrhage and perforation: Secondary | ICD-10-CM | POA: Diagnosis not present

## 2019-05-17 DIAGNOSIS — I959 Hypotension, unspecified: Secondary | ICD-10-CM | POA: Diagnosis not present

## 2019-05-17 DIAGNOSIS — N390 Urinary tract infection, site not specified: Secondary | ICD-10-CM | POA: Diagnosis not present

## 2019-05-17 DIAGNOSIS — E871 Hypo-osmolality and hyponatremia: Secondary | ICD-10-CM | POA: Diagnosis not present

## 2019-05-17 DIAGNOSIS — J449 Chronic obstructive pulmonary disease, unspecified: Secondary | ICD-10-CM | POA: Diagnosis not present

## 2019-05-17 DIAGNOSIS — T182XXD Foreign body in stomach, subsequent encounter: Secondary | ICD-10-CM | POA: Diagnosis not present

## 2019-05-18 DIAGNOSIS — G4733 Obstructive sleep apnea (adult) (pediatric): Secondary | ICD-10-CM | POA: Diagnosis not present

## 2019-05-18 DIAGNOSIS — J449 Chronic obstructive pulmonary disease, unspecified: Secondary | ICD-10-CM | POA: Diagnosis not present

## 2019-05-18 DIAGNOSIS — E871 Hypo-osmolality and hyponatremia: Secondary | ICD-10-CM | POA: Diagnosis not present

## 2019-05-18 DIAGNOSIS — T182XXD Foreign body in stomach, subsequent encounter: Secondary | ICD-10-CM | POA: Diagnosis not present

## 2019-05-18 DIAGNOSIS — M25562 Pain in left knee: Secondary | ICD-10-CM | POA: Diagnosis not present

## 2019-05-18 DIAGNOSIS — I959 Hypotension, unspecified: Secondary | ICD-10-CM | POA: Diagnosis not present

## 2019-05-18 DIAGNOSIS — K256 Chronic or unspecified gastric ulcer with both hemorrhage and perforation: Secondary | ICD-10-CM | POA: Diagnosis not present

## 2019-05-18 DIAGNOSIS — N390 Urinary tract infection, site not specified: Secondary | ICD-10-CM | POA: Diagnosis not present

## 2019-05-21 ENCOUNTER — Other Ambulatory Visit (HOSPITAL_COMMUNITY)
Admission: RE | Admit: 2019-05-21 | Discharge: 2019-05-21 | Disposition: A | Discharge status: Home / Self Care | Payer: Medicare Other | Source: Ambulatory Visit | Attending: Gastroenterology | Admitting: Gastroenterology

## 2019-05-21 DIAGNOSIS — Z01812 Encounter for preprocedural laboratory examination: Secondary | ICD-10-CM | POA: Insufficient documentation

## 2019-05-21 DIAGNOSIS — Z20828 Contact with and (suspected) exposure to other viral communicable diseases: Secondary | ICD-10-CM | POA: Diagnosis not present

## 2019-05-22 ENCOUNTER — Other Ambulatory Visit: Payer: Self-pay

## 2019-05-22 ENCOUNTER — Encounter (HOSPITAL_COMMUNITY): Payer: Self-pay | Admitting: *Deleted

## 2019-05-22 LAB — NOVEL CORONAVIRUS, NAA (HOSP ORDER, SEND-OUT TO REF LAB; TAT 18-24 HRS): SARS-CoV-2, NAA: NOT DETECTED

## 2019-05-23 NOTE — Progress Notes (Signed)
Pre op call complete. Patient states they have remained quarantined since COVID test and have not felt sick nor been around any one sick. Does state she has a minor cough but thinks it's related to asthma. All questions addressed.

## 2019-05-24 ENCOUNTER — Other Ambulatory Visit: Payer: Self-pay

## 2019-05-24 ENCOUNTER — Ambulatory Visit (HOSPITAL_COMMUNITY): Payer: Medicare Other | Admitting: Certified Registered"

## 2019-05-24 ENCOUNTER — Encounter (HOSPITAL_COMMUNITY)
Admission: RE | Disposition: A | Discharge status: Home / Self Care | Payer: Self-pay | Source: Home / Self Care | Attending: Gastroenterology

## 2019-05-24 ENCOUNTER — Ambulatory Visit (HOSPITAL_COMMUNITY)
Admission: RE | Admit: 2019-05-24 | Discharge: 2019-05-24 | Disposition: A | Discharge status: Home / Self Care | Payer: Medicare Other | Attending: Gastroenterology | Admitting: Gastroenterology

## 2019-05-24 ENCOUNTER — Encounter (HOSPITAL_COMMUNITY): Payer: Self-pay

## 2019-05-24 DIAGNOSIS — K255 Chronic or unspecified gastric ulcer with perforation: Secondary | ICD-10-CM

## 2019-05-24 DIAGNOSIS — K2950 Unspecified chronic gastritis without bleeding: Secondary | ICD-10-CM | POA: Insufficient documentation

## 2019-05-24 DIAGNOSIS — M1990 Unspecified osteoarthritis, unspecified site: Secondary | ICD-10-CM | POA: Diagnosis not present

## 2019-05-24 DIAGNOSIS — Z6841 Body Mass Index (BMI) 40.0 and over, adult: Secondary | ICD-10-CM | POA: Diagnosis not present

## 2019-05-24 DIAGNOSIS — Z8673 Personal history of transient ischemic attack (TIA), and cerebral infarction without residual deficits: Secondary | ICD-10-CM | POA: Diagnosis not present

## 2019-05-24 DIAGNOSIS — Z09 Encounter for follow-up examination after completed treatment for conditions other than malignant neoplasm: Secondary | ICD-10-CM | POA: Diagnosis not present

## 2019-05-24 DIAGNOSIS — Z8711 Personal history of peptic ulcer disease: Secondary | ICD-10-CM | POA: Diagnosis not present

## 2019-05-24 DIAGNOSIS — G4730 Sleep apnea, unspecified: Secondary | ICD-10-CM | POA: Insufficient documentation

## 2019-05-24 DIAGNOSIS — I10 Essential (primary) hypertension: Secondary | ICD-10-CM | POA: Insufficient documentation

## 2019-05-24 DIAGNOSIS — K219 Gastro-esophageal reflux disease without esophagitis: Secondary | ICD-10-CM | POA: Diagnosis not present

## 2019-05-24 DIAGNOSIS — J45909 Unspecified asthma, uncomplicated: Secondary | ICD-10-CM | POA: Diagnosis not present

## 2019-05-24 DIAGNOSIS — I639 Cerebral infarction, unspecified: Secondary | ICD-10-CM | POA: Diagnosis not present

## 2019-05-24 DIAGNOSIS — K275 Chronic or unspecified peptic ulcer, site unspecified, with perforation: Secondary | ICD-10-CM

## 2019-05-24 DIAGNOSIS — K3189 Other diseases of stomach and duodenum: Secondary | ICD-10-CM | POA: Insufficient documentation

## 2019-05-24 DIAGNOSIS — K259 Gastric ulcer, unspecified as acute or chronic, without hemorrhage or perforation: Secondary | ICD-10-CM | POA: Diagnosis not present

## 2019-05-24 HISTORY — PX: BIOPSY: SHX5522

## 2019-05-24 HISTORY — PX: ESOPHAGOGASTRODUODENOSCOPY (EGD) WITH PROPOFOL: SHX5813

## 2019-05-24 SURGERY — ESOPHAGOGASTRODUODENOSCOPY (EGD) WITH PROPOFOL
Anesthesia: Monitor Anesthesia Care

## 2019-05-24 MED ORDER — PROPOFOL 500 MG/50ML IV EMUL
INTRAVENOUS | Status: DC | PRN
  Administered 2019-05-24: 20 mg via INTRAVENOUS
  Administered 2019-05-24: 40 mg via INTRAVENOUS
  Administered 2019-05-24: 20 mg via INTRAVENOUS

## 2019-05-24 MED ORDER — SODIUM CHLORIDE 0.9 % IV SOLN: INTRAVENOUS | Status: DC

## 2019-05-24 MED ORDER — PROPOFOL 500 MG/50ML IV EMUL
INTRAVENOUS | Status: DC | PRN
  Administered 2019-05-24: 100 ug/kg/min via INTRAVENOUS

## 2019-05-24 MED ORDER — LIDOCAINE HCL (CARDIAC) PF 100 MG/5ML IV SOSY
PREFILLED_SYRINGE | INTRAVENOUS | Status: DC | PRN
  Administered 2019-05-24: 50 mg via INTRATRACHEAL

## 2019-05-24 MED ORDER — LACTATED RINGERS IV SOLN
INTRAVENOUS | Status: DC
  Administered 2019-05-24: 08:00:00 1000 mL via INTRAVENOUS

## 2019-05-24 SURGICAL SUPPLY — 14 items

## 2019-05-24 NOTE — Op Note (Signed)
[facility] Patient Name: [name] Procedure Date: 05/24/2019 MRN: [id number] Attending MD: Estill Cotta. 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 2nd episode perforation 2 months ago,                            also while on aspirin. Has been on pantoprazole                            since then. Providers:                Estill Cotta. Loletha Carrow, MD, Josie Dixon, RN, Cherylynn Ridges, Technician Referring MD:             Leanna Battles, MD Medicines:                Monitored Anesthesia Care Complications:            No immediate complications. Estimated Blood Loss:     Estimated blood loss was minimal. Procedure:                Pre-Anesthesia Assessment:                           - Prior to the procedure, a History and Physical                            was performed, and patient medications and                            allergies were reviewed. The patient's tolerance of                            previous anesthesia was also reviewed. The risks                            and benefits of the procedure and the sedation                            options and risks were discussed with the patient.  All questions were answered, and informed consent                            was obtained. Prior Anticoagulants: The patient has                            taken no previous anticoagulant or antiplatelet                            agents. ASA Grade Assessment: III - A patient with                            severe systemic disease. After reviewing the risks                            and  benefits, the patient was deemed in                            satisfactory condition to undergo the procedure.                           After obtaining informed consent, the endoscope was                            passed under direct vision. Throughout the                            procedure, the patient's blood pressure, pulse, and                            oxygen saturations were monitored continuously. The                            [id number] Olympus gastroscope was                            introduced through the mouth, and advanced to the                            second part of duodenum. The upper GI endoscopy was                            accomplished without difficulty. The patient                            tolerated the procedure well. Scope In: Scope Out: Findings:      The esophagus was normal.      A small healed ulcer was found at the pylorus. The scar tissue was       healthy in appearance. Biopsies were taken with a cold forceps for       histology to rule out H. pylori. ([place] protocol).      The exam of the stomach was otherwise normal.      The examined duodenum was normal. Impression:               -  Normal esophagus.                           - Scar in the pylorus. Biopsied.                           - Normal examined duodenum. Moderate Sedation:      Not Applicable - Patient had care per Anesthesia. Recommendation:           - Patient has a contact number available for                            emergencies. The signs and symptoms of potential                            delayed complications were discussed with the                            patient. Return to normal activities tomorrow.                            Written discharge instructions were provided to the                            patient.                           - Resume previous diet.                           - Discontinue PPI (pantoprazole) today.                           -  Await pathology results.                           - NO ASPIRIN OR NSAIDS FOR THIS PATIENT Procedure Code(s):        --- Professional ---                           [phone], Esophagogastroduodenoscopy, flexible,                            transoral; with biopsy, single or multiple Diagnosis Code(s):        --- Professional ---                           K31.89, Other diseases of stomach and duodenum                           K25.5, Chronic or unspecified gastric ulcer with                            perforation CPT copyright 2019 American Medical Association. All rights reserved. The codes documented in this report are preliminary and upon coder review may  be revised to meet current compliance requirements. Henry L. Loletha Carrow, MD  05/24/2019 8:50:04 AM This report has been signed electronically. Number of Addenda: 0

## 2019-05-24 NOTE — Interval H&P Note (Signed)
History and Physical Interval Note:  05/24/2019 8:15 AM  [name]  has presented today for surgery, with the diagnosis of GASTRIC ULCER FOLLOW UP TT.  The various methods of treatment have been discussed with the patient and family. After consideration of risks, benefits and other options for treatment, the patient has consented to  Procedure(s): ESOPHAGOGASTRODUODENOSCOPY (EGD) WITH PROPOFOL (N/A) as a surgical intervention.  The patient's history has been reviewed, patient examined, no change in status, stable for surgery.  I have reviewed the patient's chart and labs.  Questions were answered to the patient's satisfaction.     [name]

## 2019-05-24 NOTE — Discharge Instructions (Signed)
YOU HAD AN ENDOSCOPIC PROCEDURE TODAY: Refer to the procedure report and other information in the discharge instructions given to you for any specific questions about what was found during the examination. If this information does not answer your questions, please call [place] office at [phone] to clarify.  ° °YOU SHOULD EXPECT: Some feelings of bloating in the abdomen. Passage of more gas than usual. Walking can help get rid of the air that was put into your GI tract during the procedure and reduce the bloating. If you had a lower endoscopy (such as a colonoscopy or flexible sigmoidoscopy) you may notice spotting of blood in your stool or on the toilet paper. Some abdominal soreness may be present for a day or two, also. ° °DIET: Your first meal following the procedure should be a light meal and then it is ok to progress to your normal diet. A half-sandwich or bowl of soup is an example of a good first meal. Heavy or fried foods are harder to digest and may make you feel nauseous or bloated. Drink plenty of fluids but you should avoid alcoholic beverages for 24 hours. If you had a esophageal dilation, please see attached instructions for diet.   ° °ACTIVITY: Your care partner should take you home directly after the procedure. You should plan to take it easy, moving slowly for the rest of the day. You can resume normal activity the day after the procedure however YOU SHOULD NOT DRIVE, use power tools, machinery or perform tasks that involve climbing or major physical exertion for 24 hours (because of the sedation medicines used during the test).  ° °SYMPTOMS TO REPORT IMMEDIATELY: °A gastroenterologist can be reached at any hour. Please call [phone]  for any of the following symptoms:  °• Following lower endoscopy (colonoscopy, flexible sigmoidoscopy) °Excessive amounts of blood in the stool  °Significant tenderness, worsening of abdominal pains  °Swelling of the abdomen that is new, acute  °Fever of 100°  or higher  °• Following upper endoscopy (EGD, EUS, ERCP, esophageal dilation) °Vomiting of blood or coffee ground material  °New, significant abdominal pain  °New, significant chest pain or pain under the shoulder blades  °Painful or persistently difficult swallowing  °New shortness of breath  °Black, tarry-looking or red, bloody stools ° °FOLLOW UP:  °If any biopsies were taken you will be contacted by phone or by letter within the next 1-3 weeks. Call [phone]  if you have not heard about the biopsies in 3 weeks.  °Please also call with any specific questions about appointments or follow up tests.YOU HAD AN ENDOSCOPIC PROCEDURE TODAY: Refer to the procedure report and other information in the discharge instructions given to you for any specific questions about what was found during the examination. If this information does not answer your questions, please call [place] office at [phone] to clarify.  ° °YOU SHOULD EXPECT: Some feelings of bloating in the abdomen. Passage of more gas than usual. Walking can help get rid of the air that was put into your GI tract during the procedure and reduce the bloating. If you had a lower endoscopy (such as a colonoscopy or flexible sigmoidoscopy) you may notice spotting of blood in your stool or on the toilet paper. Some abdominal soreness may be present for a day or two, also. ° °DIET: Your first meal following the procedure should be a light meal and then it is ok to progress to your normal diet. A half-sandwich or bowl of soup is an example   of a good first meal. Heavy or fried foods are harder to digest and may make you feel nauseous or bloated. Drink plenty of fluids but you should avoid alcoholic beverages for 24 hours. If you had a esophageal dilation, please see attached instructions for diet.   ° °ACTIVITY: Your care partner should take you home directly after the procedure. You should plan to take it easy, moving slowly for the rest of the day. You can resume  normal activity the day after the procedure however YOU SHOULD NOT DRIVE, use power tools, machinery or perform tasks that involve climbing or major physical exertion for 24 hours (because of the sedation medicines used during the test).  ° °SYMPTOMS TO REPORT IMMEDIATELY: °A gastroenterologist can be reached at any hour. Please call [phone]  for any of the following symptoms:  °• Following upper endoscopy (EGD, EUS, ERCP, esophageal dilation) °Vomiting of blood or coffee ground material  °New, significant abdominal pain  °New, significant chest pain or pain under the shoulder blades  °Painful or persistently difficult swallowing  °New shortness of breath  °Black, tarry-looking or red, bloody stools ° °FOLLOW UP:  °If any biopsies were taken you will be contacted by phone or by letter within the next 1-3 weeks. Call [phone]  if you have not heard about the biopsies in 3 weeks.  °Please also call with any specific questions about appointments or follow up tests. °

## 2019-05-24 NOTE — H&P (Signed)
History:  This patient presents for endoscopic testing for follow up gastric ulcer See office note last week for details.  [name] Referring physician: Leanna Battles, MD  Past Medical History: Past Medical History:  Diagnosis Date  . Anxiety   . Arthritis   . Asthma    hx of  . Back pain   . Chronic constipation   . Depression    hx of  . Dyspnea   . Family history of adverse reaction to anesthesia    DAD bp ELEVATED   . GERD (gastroesophageal reflux disease)   . High cholesterol   . Hypertension   . Lymphoma ([place])   . Obesity   . Sleep apnea    uses a CPAP  . Stroke ([place])    2010  . SVT (supraventricular tachycardia) ([facility])      Past Surgical History: Past Surgical History:  Procedure Laterality Date  . BACK SURGERY  2009   for lymphoma  . BONE BIOPSY    . CESAREAN SECTION    . ESOPHAGOGASTRODUODENOSCOPY    . ESOPHAGOGASTRODUODENOSCOPY (EGD) WITH PROPOFOL N/A 08/05/2017   Procedure: ESOPHAGOGASTRODUODENOSCOPY (EGD) WITH PROPOFOL;  Surgeon: Doran Stabler, MD;  Location: [facility];  Service: Gastroenterology;  Laterality: N/A;  . EYE SURGERY     cataracts  . LAPAROSCOPY N/A 02/01/2017   Procedure: LAPAROSCOPY DIAGNOSTIC;  Surgeon: Erroll Luna, MD;  Location: [facility];  Service: General;  Laterality: N/A;  . REPLACEMENT TOTAL KNEE     right  . TOTAL ABDOMINAL HYSTERECTOMY  1973    Allergies: No Known Allergies  Outpatient Meds: Current Facility-Administered Medications  Medication Dose Route Frequency Provider Last Rate Last Dose  . lactated ringers infusion   Intravenous Continuous [name], MD 20 mL/hr at 05/24/19 0805 1,000 mL at 05/24/19 0805      ___________________________________________________________________ Objective   Exam:  BP (!) 158/72   Pulse 82   Temp 98.2 F (36.8 C) (Oral)   Resp 18   Ht 5\' 1"  (1.549 m)   Wt 121.1 kg   SpO2 98%   BMI 50.45 kg/m    CV: RRR without murmur, S1/S2, no JVD, no  peripheral edema  Resp: clear to auscultation bilaterally, normal RR and effort noted  GI: soft, no tenderness, with active bowel sounds. No guarding or palpable organomegaly noted.  Neuro: awake, alert and oriented x 3. Normal gross motor function and fluent speech   Assessment:  Gastric ulcer  Plan:  EGD   [name]

## 2019-05-24 NOTE — Anesthesia Preprocedure Evaluation (Signed)
Anesthesia Evaluation  Patient identified by MRN, date of birth, ID band Patient awake    Reviewed: Allergy & Precautions, NPO status , Patient's Chart, lab work & pertinent test results  Airway Mallampati: III  TM Distance: >3 FB     Dental  (+) Partial Upper, Partial Lower   Pulmonary shortness of breath, asthma , sleep apnea ,    breath sounds clear to auscultation       Cardiovascular hypertension, Pt. on medications  Rhythm:Regular Rate:Normal     Neuro/Psych PSYCHIATRIC DISORDERS Anxiety Depression CVA    GI/Hepatic Neg liver ROS, PUD, GERD  Medicated and Controlled,Pt with history of perforated Ulcer   Endo/Other  Morbid obesity  Renal/GU negative Renal ROS     Musculoskeletal  (+) Arthritis ,   Abdominal (+) + obese,   Peds  Hematology negative hematology ROS (+)   Anesthesia Other Findings   Reproductive/Obstetrics                             Anesthesia Physical  Anesthesia Plan  ASA: III  Anesthesia Plan: MAC   Post-op Pain Management:    Induction:   PONV Risk Score and Plan: 2 and Treatment may vary due to age or medical condition  Airway Management Planned: Mask and Natural Airway  Additional Equipment:   Intra-op Plan:   Post-operative Plan:   Informed Consent: I have reviewed the patients History and Physical, chart, labs and discussed the procedure including the risks, benefits and alternatives for the proposed anesthesia with the patient or authorized representative who has indicated his/her understanding and acceptance.     Dental advisory given  Plan Discussed with: CRNA  Anesthesia Plan Comments:         Anesthesia Quick Evaluation

## 2019-05-24 NOTE — Transfer of Care (Signed)
Immediate Anesthesia Transfer of Care Note  Patient: [name]  Procedure(s) Performed: ESOPHAGOGASTRODUODENOSCOPY (EGD) WITH PROPOFOL (N/A ) BIOPSY  Patient Location: Endoscopy Unit  Anesthesia Type:MAC  Level of Consciousness: awake, oriented and patient cooperative  Airway & Oxygen Therapy: Patient Spontanous Breathing and Patient connected to face mask oxygen  Post-op Assessment: Report given to RN and Post -op Vital signs reviewed and stable  Post vital signs: Reviewed and stable  Last Vitals:  Vitals Value Taken Time  BP 102/45 05/24/19 0850  Temp 36.9 C 05/24/19 0847  Pulse 78 05/24/19 0851  Resp 16 05/24/19 0851  SpO2 96 % 05/24/19 0851  Vitals shown include unvalidated device data.  Last Pain:  Vitals:   05/24/19 0847  TempSrc: Temporal  PainSc: 0-No pain         Complications: No apparent anesthesia complications

## 2019-05-24 NOTE — Anesthesia Postprocedure Evaluation (Signed)
Anesthesia Post Note  Patient: [name]  Procedure(s) Performed: ESOPHAGOGASTRODUODENOSCOPY (EGD) WITH PROPOFOL (N/A ) BIOPSY     Patient location during evaluation: Endoscopy Anesthesia Type: MAC Level of consciousness: awake Pain management: pain level controlled Vital Signs Assessment: post-procedure vital signs reviewed and stable Respiratory status: spontaneous breathing Cardiovascular status: stable Postop Assessment: no apparent nausea or vomiting Anesthetic complications: no    Last Vitals:  Vitals:   05/24/19 0850 05/24/19 0900  BP: (!) 102/45 98/85  Pulse: 84 71  Resp: (!) 25 17  Temp:    SpO2: 100% 91%    Last Pain:  Vitals:   05/24/19 0847  TempSrc: Temporal  PainSc: 0-No pain   Pain Goal:                   Huston Foley

## 2019-05-25 ENCOUNTER — Encounter (HOSPITAL_COMMUNITY): Payer: Self-pay | Admitting: Gastroenterology

## 2019-05-25 ENCOUNTER — Encounter: Payer: Self-pay | Admitting: Gastroenterology

## 2019-05-25 DIAGNOSIS — T182XXD Foreign body in stomach, subsequent encounter: Secondary | ICD-10-CM | POA: Diagnosis not present

## 2019-05-25 DIAGNOSIS — N390 Urinary tract infection, site not specified: Secondary | ICD-10-CM | POA: Diagnosis not present

## 2019-05-25 DIAGNOSIS — K256 Chronic or unspecified gastric ulcer with both hemorrhage and perforation: Secondary | ICD-10-CM | POA: Diagnosis not present

## 2019-05-25 DIAGNOSIS — I959 Hypotension, unspecified: Secondary | ICD-10-CM | POA: Diagnosis not present

## 2019-05-25 DIAGNOSIS — E871 Hypo-osmolality and hyponatremia: Secondary | ICD-10-CM | POA: Diagnosis not present

## 2019-05-25 DIAGNOSIS — J449 Chronic obstructive pulmonary disease, unspecified: Secondary | ICD-10-CM | POA: Diagnosis not present

## 2019-05-25 LAB — SURGICAL PATHOLOGY

## 2019-05-28 DIAGNOSIS — T182XXD Foreign body in stomach, subsequent encounter: Secondary | ICD-10-CM | POA: Diagnosis not present

## 2019-05-28 DIAGNOSIS — I959 Hypotension, unspecified: Secondary | ICD-10-CM | POA: Diagnosis not present

## 2019-05-28 DIAGNOSIS — E871 Hypo-osmolality and hyponatremia: Secondary | ICD-10-CM | POA: Diagnosis not present

## 2019-05-28 DIAGNOSIS — N390 Urinary tract infection, site not specified: Secondary | ICD-10-CM | POA: Diagnosis not present

## 2019-05-28 DIAGNOSIS — K256 Chronic or unspecified gastric ulcer with both hemorrhage and perforation: Secondary | ICD-10-CM | POA: Diagnosis not present

## 2019-05-28 DIAGNOSIS — J449 Chronic obstructive pulmonary disease, unspecified: Secondary | ICD-10-CM | POA: Diagnosis not present

## 2019-05-30 DIAGNOSIS — K256 Chronic or unspecified gastric ulcer with both hemorrhage and perforation: Secondary | ICD-10-CM | POA: Diagnosis not present

## 2019-05-30 DIAGNOSIS — N390 Urinary tract infection, site not specified: Secondary | ICD-10-CM | POA: Diagnosis not present

## 2019-05-30 DIAGNOSIS — J449 Chronic obstructive pulmonary disease, unspecified: Secondary | ICD-10-CM | POA: Diagnosis not present

## 2019-05-30 DIAGNOSIS — T182XXD Foreign body in stomach, subsequent encounter: Secondary | ICD-10-CM | POA: Diagnosis not present

## 2019-05-30 DIAGNOSIS — E871 Hypo-osmolality and hyponatremia: Secondary | ICD-10-CM | POA: Diagnosis not present

## 2019-05-30 DIAGNOSIS — I959 Hypotension, unspecified: Secondary | ICD-10-CM | POA: Diagnosis not present

## 2019-06-06 DIAGNOSIS — R0602 Shortness of breath: Secondary | ICD-10-CM | POA: Diagnosis not present

## 2019-06-06 DIAGNOSIS — G4733 Obstructive sleep apnea (adult) (pediatric): Secondary | ICD-10-CM | POA: Diagnosis not present

## 2019-06-06 DIAGNOSIS — M25562 Pain in left knee: Secondary | ICD-10-CM | POA: Diagnosis not present

## 2019-06-06 DIAGNOSIS — I10 Essential (primary) hypertension: Secondary | ICD-10-CM | POA: Diagnosis not present

## 2019-06-06 DIAGNOSIS — R7301 Impaired fasting glucose: Secondary | ICD-10-CM | POA: Diagnosis not present

## 2019-07-20 ENCOUNTER — Encounter (HOSPITAL_COMMUNITY): Payer: Self-pay | Admitting: Emergency Medicine

## 2019-07-20 ENCOUNTER — Emergency Department (HOSPITAL_COMMUNITY): Payer: Medicare Other

## 2019-07-20 ENCOUNTER — Emergency Department (HOSPITAL_COMMUNITY)
Admission: EM | Admit: 2019-07-20 | Discharge: 2019-07-20 | Disposition: A | Discharge status: Home / Self Care | Payer: Medicare Other | Attending: Emergency Medicine | Admitting: Emergency Medicine

## 2019-07-20 ENCOUNTER — Other Ambulatory Visit: Payer: Self-pay

## 2019-07-20 DIAGNOSIS — M791 Myalgia, unspecified site: Secondary | ICD-10-CM | POA: Diagnosis present

## 2019-07-20 DIAGNOSIS — J45909 Unspecified asthma, uncomplicated: Secondary | ICD-10-CM | POA: Diagnosis not present

## 2019-07-20 DIAGNOSIS — I110 Hypertensive heart disease with heart failure: Secondary | ICD-10-CM | POA: Insufficient documentation

## 2019-07-20 DIAGNOSIS — Z79899 Other long term (current) drug therapy: Secondary | ICD-10-CM | POA: Diagnosis not present

## 2019-07-20 DIAGNOSIS — I501 Left ventricular failure: Secondary | ICD-10-CM | POA: Diagnosis not present

## 2019-07-20 DIAGNOSIS — Z96651 Presence of right artificial knee joint: Secondary | ICD-10-CM | POA: Diagnosis not present

## 2019-07-20 DIAGNOSIS — Z8572 Personal history of non-Hodgkin lymphomas: Secondary | ICD-10-CM | POA: Diagnosis not present

## 2019-07-20 DIAGNOSIS — U071 COVID-19: Secondary | ICD-10-CM | POA: Diagnosis not present

## 2019-07-20 LAB — CBC WITH DIFFERENTIAL/PLATELET
Abs Immature Granulocytes: 0.04 10*3/uL (ref 0.00–0.07)
Basophils Absolute: 0.1 10*3/uL (ref 0.0–0.1)
Basophils Relative: 1 %
Eosinophils Absolute: 0.1 10*3/uL (ref 0.0–0.5)
Eosinophils Relative: 1 %
HCT: 34.8 % — ABNORMAL LOW (ref 36.0–46.0)
Hemoglobin: 10.1 g/dL — ABNORMAL LOW (ref 12.0–15.0)
Immature Granulocytes: 1 %
Lymphocytes Relative: 22 %
Lymphs Abs: 1.5 10*3/uL (ref 0.7–4.0)
MCH: 24.3 pg — ABNORMAL LOW (ref 26.0–34.0)
MCHC: 29 g/dL — ABNORMAL LOW (ref 30.0–36.0)
MCV: 83.7 fL (ref 80.0–100.0)
Monocytes Absolute: 1.1 10*3/uL — ABNORMAL HIGH (ref 0.1–1.0)
Monocytes Relative: 16 %
Neutro Abs: 4.2 10*3/uL (ref 1.7–7.7)
Neutrophils Relative %: 59 %
Platelets: 354 10*3/uL (ref 150–400)
RBC: 4.16 MIL/uL (ref 3.87–5.11)
RDW: 16.4 % — ABNORMAL HIGH (ref 11.5–15.5)
WBC: 6.9 10*3/uL (ref 4.0–10.5)
nRBC: 0 % (ref 0.0–0.2)

## 2019-07-20 LAB — BASIC METABOLIC PANEL
Anion gap: 8 (ref 5–15)
BUN: 15 mg/dL (ref 8–23)
CO2: 30 mmol/L (ref 22–32)
Calcium: 8.8 mg/dL — ABNORMAL LOW (ref 8.9–10.3)
Chloride: 98 mmol/L (ref 98–111)
Creatinine, Ser: 0.93 mg/dL (ref 0.44–1.00)
GFR calc Af Amer: >60 mL/min (ref >60)
GFR calc non Af Amer: 58 mL/min — ABNORMAL LOW (ref >60)
Glucose, Bld: 108 mg/dL — ABNORMAL HIGH (ref 70–99)
Potassium: 3.7 mmol/L (ref 3.5–5.1)
Sodium: 136 mmol/L (ref 135–145)

## 2019-07-20 LAB — TROPONIN I (HIGH SENSITIVITY)
Troponin I (High Sensitivity): 7 ng/L (ref <18)
Troponin I (High Sensitivity): 6 ng/L (ref <18)

## 2019-07-20 MED ORDER — DEXAMETHASONE SODIUM PHOSPHATE 10 MG/ML IJ SOLN
10.0000 mg | Freq: Once | INTRAMUSCULAR | Status: AC
  Administered 2019-07-20: 10 mg via INTRAVENOUS
  Filled 2019-07-20: qty 1

## 2019-07-20 MED ORDER — ALBUTEROL SULFATE HFA 108 (90 BASE) MCG/ACT IN AERS
4.0000 | INHALATION_SPRAY | RESPIRATORY_TRACT | Status: DC | PRN
  Administered 2019-07-20: 16:00:00 4 via RESPIRATORY_TRACT
  Filled 2019-07-20: qty 6.7

## 2019-07-20 MED ORDER — BENZONATATE 100 MG PO CAPS: 100.0000 mg | ORAL_CAPSULE | Freq: Three times a day (TID) | ORAL | 0 refills | Status: DC

## 2019-07-20 MED ORDER — ONDANSETRON HCL 4 MG PO TABS: 4.0000 mg | ORAL_TABLET | Freq: Three times a day (TID) | ORAL | 0 refills | Status: AC | PRN

## 2019-07-20 MED ORDER — ONDANSETRON 4 MG PO TBDP
4.0000 mg | ORAL_TABLET | Freq: Once | ORAL | Status: AC
  Administered 2019-07-20: 4 mg via ORAL
  Filled 2019-07-20: qty 1

## 2019-07-20 NOTE — ED Triage Notes (Signed)
Pt reports SOB with exertion, nausea, productive cough.

## 2019-07-20 NOTE — ED Notes (Signed)
Daughter of the patient [name] would like an update on her mother, would also like to give her insurance and contact information to registration. [phone]. The daughter would like to come stay with the mother also.

## 2019-07-20 NOTE — ED Notes (Signed)
Per PCP, states patient tested positive for Covid today-increased symptoms-history of Afib, COPD-daughter tested positive 1 week ago, patient has been in contact with daughter

## 2019-07-20 NOTE — ED Provider Notes (Signed)
[facility] DEPT Provider Note   CSN: [id number] Arrival date & time: 07/20/19  1532     History Chief Complaint  Patient presents with  . covid+  . Fatigue  . Emesis  . Cough  . Shortness of Breath    [name] is a 78 y.o. female.  The history is provided by the patient. No language interpreter was used.  Emesis Associated symptoms: cough   Cough Associated symptoms: shortness of breath   Shortness of Breath Associated symptoms: cough and vomiting        78 year old female with history, anxiety, hypertension, prior stroke, recently diagnosed with COVID-19 presenting with cold symptoms.  Patient reports since yesterday she developed a mild headache, body aches, fatigue, increased wheezing, decrease in appetite, mild nausea and productive cough.  She was seen by PCP office today and had a positive Covid test.  She was encouraged by her PCP to come to the ER for further valuation.  She does not complain of any exertional chest pain no dysuria no hemoptysis.  Denies any loss of taste or smell.  Does have history of asthma and he denies any increase using her inhaler.  She has a CPAP at home for her obstructive sleep apnea.  She goes home herself.  Unable to recall any specific sick contact.  Past Medical History:  Diagnosis Date  . Anxiety   . Arthritis   . Asthma    hx of  . Back pain   . Chronic constipation   . Depression    hx of  . Dyspnea   . Family history of adverse reaction to anesthesia    DAD bp ELEVATED   . GERD (gastroesophageal reflux disease)   . High cholesterol   . Hypertension   . Lymphoma ([place])   . Obesity   . Sleep apnea    uses a CPAP  . Stroke ([place])    2010  . SVT (supraventricular tachycardia) [facility])     Patient Active Problem List   Diagnosis Date Noted  . Perforated gastric ulcer ([place]) 03/28/2019  . Hypotension 03/28/2019  . Hematemesis 03/28/2019  . Acute lower UTI 03/28/2019  . Leukocytosis  03/28/2019  . Hyponatremia 03/28/2019  . Perforated peptic ulcer ([place]) 03/28/2019  . Severe hypoxemia 08/11/2018  . 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 Moderate persistent asthma 06/14/2018  . 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 Sleeps in sitting position due to orthopnea 05/23/2018  . 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 Peptic ulcer with perforation ([place]) 01/31/2017  . Stroke ([place]) 07/20/2011    Class: Acute  . Nausea and vomiting 07/18/2011  . Lateral nystagmus 07/18/2011    Class: Acute  . 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 Hypertension 07/18/2011    Class: History of  . Obesity 07/18/2011    Class: Chronic  . SVT (supraventricular tachycardia) ([place]) 07/18/2011    Class: History of  . Depression 07/18/2011    Class: History of  . History of hypokalemia 07/18/2011    Class: History of  . Back pain, chronic 07/18/2011    Class: Chronic  . 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    Class: History of  . DISORDER OF BONE AND CARTILAGE UNSPECIFIED 04/29/2008    Past Surgical History:  Procedure Laterality Date  . BACK SURGERY  2009   for lymphoma  . BIOPSY  05/24/2019   Procedure:  BIOPSY;  Surgeon: Doran Stabler, MD;  Location: [facility];  Service: Gastroenterology;;  . BONE BIOPSY    . CESAREAN SECTION    . ESOPHAGOGASTRODUODENOSCOPY    . ESOPHAGOGASTRODUODENOSCOPY (EGD) WITH PROPOFOL N/A 08/05/2017   Procedure: ESOPHAGOGASTRODUODENOSCOPY (EGD) WITH PROPOFOL;  Surgeon: Doran Stabler, MD;  [facility];  Service: Gastroenterology;  Laterality: N/A;  . ESOPHAGOGASTRODUODENOSCOPY (EGD) WITH PROPOFOL N/A 05/24/2019   Procedure: ESOPHAGOGASTRODUODENOSCOPY (EGD) WITH PROPOFOL;  Surgeon: Doran Stabler, MD;  [facility];  Service: Gastroenterology;  Laterality: N/A;  . EYE SURGERY     cataracts  . LAPAROSCOPY N/A 02/01/2017     Procedure: LAPAROSCOPY DIAGNOSTIC;  Surgeon: Erroll Luna, MD;  Location: [facility];  Service: General;  Laterality: N/A;  . REPLACEMENT TOTAL KNEE     right  . TOTAL ABDOMINAL HYSTERECTOMY  1973     OB History   No obstetric history on file.     Family History  Problem Relation Age of Onset  . Lymphoma Mother   . Breast cancer Mother   . Coronary artery disease Father 60       died  . Stroke Father   . Colon cancer Maternal Uncle     Social History   Tobacco Use  . Smoking status: Never Smoker  . Smokeless tobacco: Never Used  Substance Use Topics  . Alcohol use: No  . Drug use: No    Home Medications Prior to Admission medications   Medication Sig Start Date End Date Taking? Authorizing Provider  acetaminophen (TYLENOL) 500 MG tablet Take 1,000 mg by mouth 2 (two) times daily.     [provider]  albuterol (PROAIR HFA) 108 (90 Base) MCG/ACT inhaler Inhale 2 puffs into the lungs every 6 (six) hours as needed for wheezing or shortness of breath.     [provider]  ASPERCREME LIDOCAINE EX Apply 1 application topically 2 (two) times daily as needed (knee pain).     [provider]  DULoxetine (CYMBALTA) 60 MG capsule Take 60 mg by mouth daily.    [provider]  Fluticasone-Umeclidin-Vilant (TRELEGY ELLIPTA) 100-62.5-25 MCG/INH AEPB Inhale 1 puff into the lungs daily.    [provider]  furosemide (LASIX) 40 MG tablet Take 40 mg by mouth daily as needed (for fluid retention.).     [provider]  ipratropium (ATROVENT) 0.02 % nebulizer solution Take 0.5 mg by nebulization every 4 (four) hours as needed for wheezing or shortness of breath.    [provider]  lubiprostone (AMITIZA) 24 MCG capsule Take 24 mcg by mouth daily.     [provider]  Menthol, Topical Analgesic, (BIOFREEZE) 4 % GEL Apply 1 application topically 2 (two) times daily as needed (knee pain).     [provider]   metoprolol tartrate (LOPRESSOR) 25 MG tablet Take 2 tablets (50 mg total) by mouth 2 (two) times daily. 04/04/19 05/24/19  Antonieta Pert, MD  montelukast (SINGULAIR) 10 MG tablet Take 10 mg by mouth daily.  12/29/12   [provider]  Multiple Vitamin (MULTIVITAMIN WITH MINERALS) TABS tablet Take 1 tablet by mouth daily.    [provider]  Olmesartan-amLODIPine-HCTZ 40-5-12.5 MG TABS Take 1 tablet by mouth daily at 12 noon.    [provider]  potassium chloride SA (K-DUR,KLOR-CON) 20 MEQ tablet Take 20 mEq by mouth as needed (With lasex).  12/29/16   [provider]  pravastatin (PRAVACHOL) 20 MG tablet Take 20  mg by mouth daily.      [provider]  PRESCRIPTION MEDICATION Inhale into the lungs at bedtime. CPAP    [provider]  Vitamin D, Ergocalciferol, (DRISDOL) 50000 UNITS CAPS Take 50,000 Units by mouth every Monday.     [provider]    Allergies    Patient has no known allergies.  Review of Systems   Review of Systems  Respiratory: Positive for cough and shortness of breath.   Gastrointestinal: Positive for vomiting.  All other systems reviewed and are negative.   Physical Exam Updated Vital Signs BP 134/74   Pulse 78   Temp 98.6 F (37 C) (Oral)   Resp (!) 21   SpO2 95%   Physical Exam Vitals and nursing note reviewed.  Constitutional:      General: She is not in acute distress.    Appearance: She is well-developed. She is obese.  HENT:     Head: Atraumatic.  Eyes:     Conjunctiva/sclera: Conjunctivae normal.  Cardiovascular:     Rate and Rhythm: Normal rate and regular rhythm.  Pulmonary:     Effort: Pulmonary effort is normal.     Breath sounds: Examination of the left-lower field reveals rales. Wheezing and rales present. No decreased breath sounds or rhonchi.  Chest:     Chest wall: No tenderness.  Musculoskeletal:     Cervical back: Neck supple.  Skin:    Findings: No rash.  Neurological:      General: No focal deficit present.     Mental Status: She is alert.  Psychiatric:        Mood and Affect: Mood normal.     ED Results / Procedures / Treatments   Labs (all labs ordered are listed, but only abnormal results are displayed) Labs Reviewed  BASIC METABOLIC PANEL - Abnormal; Notable for the following components:      Result Value   Glucose, Bld 108 (*)    Calcium 8.8 (*)    GFR calc non Af Amer 58 (*)    All other components within normal limits  CBC WITH DIFFERENTIAL/PLATELET - Abnormal; Notable for the following components:   Hemoglobin 10.1 (*)    HCT 34.8 (*)    MCH 24.3 (*)    MCHC 29.0 (*)    RDW 16.4 (*)    Monocytes Absolute 1.1 (*)    All other components within normal limits  TROPONIN I (HIGH SENSITIVITY)  TROPONIN I (HIGH SENSITIVITY)    EKG EKG Interpretation  Date/Time:  Friday July 20 2019 15:59:53 EST Ventricular Rate:  71 PR Interval:    QRS Duration: 147 QT Interval:  446 QTC Calculation: 485 R Axis:   -13 Text Interpretation: Sinus rhythm Left bundle branch block No significant change was found Confirmed by Ezequiel Essex [phone]) on 07/20/2019 4:06:56 PM   Radiology DG Chest Port 1 View  Result Date: 07/20/2019 CLINICAL DATA:  Shortness of breath, COVID positive EXAM: PORTABLE CHEST 1 VIEW COMPARISON:  04/01/2019 FINDINGS: Minimal left basilar density, likely atelectasis, improved since prior study. Heart is borderline in size. Right lung clear. No effusions, edema or acute bony abnormality. IMPRESSION: Minimal left base opacity, likely atelectasis, improved since prior study. Borderline heart size. Electronically Signed   By: Rolm Baptise M.D.   On: 07/20/2019 16:18    Procedures Procedures (including critical care time)  Medications Ordered in ED Medications  albuterol (VENTOLIN HFA) 108 (90 Base) MCG/ACT inhaler 4 puff (4 puffs Inhalation Given 07/20/19  1623)  dexamethasone (DECADRON) injection 10 mg (has no  administration in time range)    ED Course  I have reviewed the triage vital signs and the nursing notes.  Pertinent labs & imaging results that were available during my care of the patient were reviewed by me and considered in my medical decision making (see chart for details).    MDM Rules/Calculators/A&P                      BP 134/74   Pulse 78   Temp 98.6 F (37 C) (Oral)   Resp (!) 21   SpO2 95%   Final Clinical Impression(s) / ED Diagnoses Final diagnoses:  COVID-19 virus infection    Rx / DC Orders ED Discharge Orders         Ordered    benzonatate (TESSALON) 100 MG capsule  Every 8 hours     07/20/19 1946         3:52 PM Patient developed cold symptoms since yesterday, test positive for COVID-19 at her PCPs office and was recommended to come here for evaluation.  She endorsed mild shortness of breath, History of asthma.  She is otherwise well-appearing, sats at 99% on room air.  Patient likely would not require hospital admission at this time.  Will perform screening labs and chest x-ray.  7:41 PM EKG reassuring, chest x-ray shows minimal left base opacity improved from prior study.  Delta troponin unremarkable, labs are at baseline.  When ambulation O2 sats at 98% on room air.  I have reached out and discussed with patient's daughter over the phone who agrees to pick her up.  She is stable to be discharged.  Return precaution given.  Care discussed with Dr. Wyvonnia Dusky.   [name] was evaluated in Emergency Department on 07/20/2019 for the symptoms described in the history of present illness. She was evaluated in the context of the global COVID-19 pandemic, which necessitated consideration that the patient might be at risk for infection with the SARS-CoV-2 virus that causes COVID-19. Institutional protocols and algorithms that pertain to the evaluation of patients at risk for COVID-19 are in a state of rapid change based on information released by regulatory bodies  including the CDC and federal and state organizations. These policies and algorithms were followed during the patient's care in the ED.    Domenic Moras, PA-C 07/20/19 1947    Ezequiel Essex, MD 07/21/19 [phone]

## 2019-07-20 NOTE — ED Notes (Signed)
Pt ambulated in room and saturations were maintained around 98% on room air. No complaints noted.

## 2019-07-20 NOTE — ED Notes (Signed)
Pt began dry heaving when moving from bed to wheelchair. ED PA made aware, Zofran ordered.

## 2019-07-20 NOTE — ED Notes (Signed)
Spoke with Daughter Daphane regarding pt status.

## 2019-07-20 NOTE — Discharge Instructions (Signed)
You have been evaluated for your condition.  You have been diagnosed with Covid-19.  Take cough medication as needed, take over the counter tylenol for pain. Return to the ER if your symptoms worsen especially if you have increase shortness of breath.

## 2019-07-23 ENCOUNTER — Telehealth: Payer: Self-pay | Admitting: Nurse Practitioner

## 2019-07-23 NOTE — Telephone Encounter (Signed)
Called to Discuss with patient about Covid symptoms and the use of bamlanivimab, a monoclonal antibody infusion for those with mild to moderate Covid symptoms and at a high risk of hospitalization.     Pt is qualified for this infusion at the [facility] infusion center due to co-morbid conditions and/or a member of an at-risk group.     Patient Active Problem List   Diagnosis Date Noted  . Perforated gastric ulcer ([place]) 03/28/2019  . Hypotension 03/28/2019  . Hematemesis 03/28/2019  . Acute lower UTI 03/28/2019  . Leukocytosis 03/28/2019  . Hyponatremia 03/28/2019  . Perforated peptic ulcer ([place]) 03/28/2019  . Severe hypoxemia 08/11/2018  . 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 Moderate persistent asthma 06/14/2018  . 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 Sleeps in sitting position due to orthopnea 05/23/2018  . 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 Peptic ulcer with perforation ([place]) 01/31/2017  . Stroke ([place]) 07/20/2011    Class: Acute  . Nausea and vomiting 07/18/2011  . Lateral nystagmus 07/18/2011    Class: Acute  . 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 Hypertension 07/18/2011    Class: History of  . Obesity 07/18/2011    Class: Chronic  . SVT (supraventricular tachycardia) ([place]) 07/18/2011    Class: History of  . Depression 07/18/2011    Class: History of  . History of hypokalemia 07/18/2011    Class: History of  . Back pain, chronic 07/18/2011    Class: Chronic  . 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    Class: History of  . DISORDER OF BONE AND CARTILAGE UNSPECIFIED 04/29/2008    Patient declines infusion at this time. Symptoms tier reviewed as well as criteria for ending isolation. Preventative practices reviewed. Patient verbalized understanding.    Patient advised to call back if he decides that he does want to get infusion. Callback number to  the infusion center given. Patient advised to go to Urgent care or ED with severe symptoms.   Note: Symptoms started on 07/19/19

## 2019-07-24 ENCOUNTER — Other Ambulatory Visit: Payer: Self-pay | Admitting: Critical Care Medicine

## 2019-07-24 DIAGNOSIS — Z6841 Body Mass Index (BMI) 40.0 and over, adult: Secondary | ICD-10-CM

## 2019-07-24 DIAGNOSIS — U071 COVID-19: Secondary | ICD-10-CM

## 2019-07-24 DIAGNOSIS — I6350 Cerebral infarction due to unspecified occlusion or stenosis of unspecified cerebral artery: Secondary | ICD-10-CM

## 2019-07-24 DIAGNOSIS — I10 Essential (primary) hypertension: Secondary | ICD-10-CM

## 2019-07-24 NOTE — Progress Notes (Signed)
  I connected by phone with [name] on 07/24/2019 at 12:11 PM to discuss the potential use of an new treatment for mild to moderate COVID-19 viral infection in non-hospitalized patients.  This patient is a 78 y.o. female that meets the FDA criteria for Emergency Use Authorization of bamlanivimab or casirivimab\imdevimab.  Has a (+) direct SARS-CoV-2 viral test result  Has mild or moderate COVID-19   Is ? 78 years of age and weighs ? 40 kg  Is NOT hospitalized due to COVID-19  Is NOT requiring oxygen therapy or requiring an increase in baseline oxygen flow rate due to COVID-19  Is within 10 days of symptom onset  Has at least one of the high risk factor(s) for progression to severe COVID-19 and/or hospitalization as defined in EUA. Specific high risk criteria : >/= 79 yo, age >55 HTN, Stroke hx, BMI 50  I have spoken and communicated the following to the patient or parent/caregiver:  1. FDA has authorized the emergency use of bamlanivimab and casirivimab\imdevimab for the treatment of mild to moderate COVID-19 in adults and pediatric patients with positive results of direct SARS-CoV-2 viral testing who are 52 years of age and older weighing at least 40 kg, and who are at high risk for progressing to severe COVID-19 and/or hospitalization.  2. The significant known and potential risks and benefits of bamlanivimab and casirivimab\imdevimab, and the extent to which such potential risks and benefits are unknown.  3. Information on available alternative treatments and the risks and benefits of those alternatives, including clinical trials.  4. Patients treated with bamlanivimab and casirivimab\imdevimab should continue to self-isolate and use infection control measures (e.g., wear mask, isolate, social distance, avoid sharing personal items, clean and disinfect "high touch" surfaces, and frequent handwashing) according to CDC guidelines.   5. The patient or parent/caregiver has the option  to accept or refuse bamlanivimab or casirivimab\imdevimab .  After reviewing this information with the patient, The patient agreed to proceed with receiving the bamlanimivab infusion and will be provided a copy of the Fact sheet prior to receiving the infusion.[name] 07/24/2019 12:11 PM

## 2019-07-26 ENCOUNTER — Ambulatory Visit (HOSPITAL_COMMUNITY): Payer: Medicare Other | Attending: Pulmonary Disease

## 2019-09-26 DIAGNOSIS — Z1231 Encounter for screening mammogram for malignant neoplasm of breast: Secondary | ICD-10-CM | POA: Diagnosis not present

## 2019-10-15 DIAGNOSIS — H26493 Other secondary cataract, bilateral: Secondary | ICD-10-CM | POA: Diagnosis not present

## 2019-10-15 DIAGNOSIS — D23111 Other benign neoplasm of skin of right upper eyelid, including canthus: Secondary | ICD-10-CM | POA: Diagnosis not present

## 2019-10-15 DIAGNOSIS — H35371 Puckering of macula, right eye: Secondary | ICD-10-CM | POA: Diagnosis not present

## 2019-10-15 DIAGNOSIS — H40023 Open angle with borderline findings, high risk, bilateral: Secondary | ICD-10-CM | POA: Diagnosis not present

## 2019-11-15 DIAGNOSIS — J45909 Unspecified asthma, uncomplicated: Secondary | ICD-10-CM | POA: Diagnosis not present

## 2019-11-15 DIAGNOSIS — J45901 Unspecified asthma with (acute) exacerbation: Secondary | ICD-10-CM | POA: Diagnosis not present

## 2019-11-15 DIAGNOSIS — D649 Anemia, unspecified: Secondary | ICD-10-CM | POA: Diagnosis not present

## 2019-11-15 DIAGNOSIS — K275 Chronic or unspecified peptic ulcer, site unspecified, with perforation: Secondary | ICD-10-CM | POA: Diagnosis not present

## 2019-11-15 DIAGNOSIS — Z8711 Personal history of peptic ulcer disease: Secondary | ICD-10-CM | POA: Diagnosis not present

## 2019-11-15 DIAGNOSIS — C8338 Diffuse large B-cell lymphoma, lymph nodes of multiple sites: Secondary | ICD-10-CM | POA: Diagnosis not present

## 2019-11-16 DIAGNOSIS — I10 Essential (primary) hypertension: Secondary | ICD-10-CM | POA: Diagnosis not present

## 2019-11-16 DIAGNOSIS — J189 Pneumonia, unspecified organism: Secondary | ICD-10-CM | POA: Diagnosis not present

## 2019-11-16 DIAGNOSIS — R0600 Dyspnea, unspecified: Secondary | ICD-10-CM | POA: Diagnosis not present

## 2019-11-16 DIAGNOSIS — J449 Chronic obstructive pulmonary disease, unspecified: Secondary | ICD-10-CM | POA: Diagnosis not present

## 2019-12-17 DIAGNOSIS — I4891 Unspecified atrial fibrillation: Secondary | ICD-10-CM | POA: Diagnosis not present

## 2019-12-17 DIAGNOSIS — M25562 Pain in left knee: Secondary | ICD-10-CM | POA: Diagnosis not present

## 2019-12-17 DIAGNOSIS — J189 Pneumonia, unspecified organism: Secondary | ICD-10-CM | POA: Diagnosis not present

## 2019-12-17 DIAGNOSIS — J449 Chronic obstructive pulmonary disease, unspecified: Secondary | ICD-10-CM | POA: Diagnosis not present

## 2020-02-22 DIAGNOSIS — E7849 Other hyperlipidemia: Secondary | ICD-10-CM | POA: Diagnosis not present

## 2020-02-22 DIAGNOSIS — M810 Age-related osteoporosis without current pathological fracture: Secondary | ICD-10-CM | POA: Diagnosis not present

## 2020-02-22 DIAGNOSIS — R7301 Impaired fasting glucose: Secondary | ICD-10-CM | POA: Diagnosis not present

## 2020-02-28 DIAGNOSIS — R7301 Impaired fasting glucose: Secondary | ICD-10-CM | POA: Diagnosis not present

## 2020-02-28 DIAGNOSIS — J449 Chronic obstructive pulmonary disease, unspecified: Secondary | ICD-10-CM | POA: Diagnosis not present

## 2020-02-28 DIAGNOSIS — R0600 Dyspnea, unspecified: Secondary | ICD-10-CM | POA: Diagnosis not present

## 2020-02-28 DIAGNOSIS — E785 Hyperlipidemia, unspecified: Secondary | ICD-10-CM | POA: Diagnosis not present

## 2020-02-28 DIAGNOSIS — Z1331 Encounter for screening for depression: Secondary | ICD-10-CM | POA: Diagnosis not present

## 2020-02-28 DIAGNOSIS — R600 Localized edema: Secondary | ICD-10-CM | POA: Diagnosis not present

## 2020-02-28 DIAGNOSIS — R82998 Other abnormal findings in urine: Secondary | ICD-10-CM | POA: Diagnosis not present

## 2020-02-28 DIAGNOSIS — Z0000 Encounter for general adult medical examination without abnormal findings: Secondary | ICD-10-CM | POA: Diagnosis not present

## 2020-02-28 DIAGNOSIS — M25562 Pain in left knee: Secondary | ICD-10-CM | POA: Diagnosis not present

## 2020-02-28 DIAGNOSIS — D509 Iron deficiency anemia, unspecified: Secondary | ICD-10-CM | POA: Diagnosis not present

## 2020-02-28 DIAGNOSIS — G4733 Obstructive sleep apnea (adult) (pediatric): Secondary | ICD-10-CM | POA: Diagnosis not present

## 2020-02-28 DIAGNOSIS — M810 Age-related osteoporosis without current pathological fracture: Secondary | ICD-10-CM | POA: Diagnosis not present

## 2020-02-28 DIAGNOSIS — I10 Essential (primary) hypertension: Secondary | ICD-10-CM | POA: Diagnosis not present

## 2020-03-05 ENCOUNTER — Other Ambulatory Visit: Payer: Self-pay | Admitting: Internal Medicine

## 2020-03-05 ENCOUNTER — Other Ambulatory Visit (HOSPITAL_COMMUNITY): Payer: Self-pay | Admitting: Internal Medicine

## 2020-03-05 DIAGNOSIS — G8929 Other chronic pain: Secondary | ICD-10-CM

## 2020-03-13 ENCOUNTER — Ambulatory Visit (HOSPITAL_COMMUNITY)
Admission: RE | Admit: 2020-03-13 | Discharge: 2020-03-13 | Disposition: A | Discharge status: Home / Self Care | Payer: Medicare Other | Source: Ambulatory Visit | Attending: Internal Medicine | Admitting: Internal Medicine

## 2020-03-13 ENCOUNTER — Other Ambulatory Visit: Payer: Self-pay

## 2020-03-13 DIAGNOSIS — G8929 Other chronic pain: Secondary | ICD-10-CM

## 2020-03-13 DIAGNOSIS — M1712 Unilateral primary osteoarthritis, left knee: Secondary | ICD-10-CM | POA: Insufficient documentation

## 2020-03-13 DIAGNOSIS — M25562 Pain in left knee: Secondary | ICD-10-CM | POA: Diagnosis not present

## 2020-03-13 MED ORDER — LIDOCAINE HCL (PF) 1 % IJ SOLN
5.0000 mL | Freq: Once | INTRAMUSCULAR | Status: AC
  Administered 2020-03-13: 5 mL via INTRADERMAL

## 2020-03-13 MED ORDER — BUPIVACAINE HCL (PF) 0.25 % IJ SOLN
5.0000 mL | Freq: Once | INTRAMUSCULAR | Status: AC
  Administered 2020-03-13: 5 mL via INTRA_ARTICULAR

## 2020-03-13 MED ORDER — BUPIVACAINE HCL (PF) 0.25 % IJ SOLN
INTRAMUSCULAR | Status: AC
  Filled 2020-03-13: qty 30

## 2020-03-13 MED ORDER — METHYLPREDNISOLONE ACETATE 80 MG/ML IJ SUSP
INTRAMUSCULAR | Status: AC
  Filled 2020-03-13: qty 1

## 2020-03-13 MED ORDER — BUPIVACAINE HCL (PF) 0.5 % IJ SOLN
INTRAMUSCULAR | Status: AC
  Filled 2020-03-13: qty 30

## 2020-03-13 MED ORDER — IOHEXOL 180 MG/ML  SOLN
20.0000 mL | Freq: Once | INTRAMUSCULAR | Status: AC | PRN
  Administered 2020-03-13: 3 mL via INTRA_ARTICULAR

## 2020-03-13 MED ORDER — METHYLPREDNISOLONE ACETATE 40 MG/ML INJ SUSP (RADIOLOG
80.0000 mg | Freq: Once | INTRAMUSCULAR | Status: AC
  Administered 2020-03-13: 80 mg via INTRA_ARTICULAR

## 2020-03-13 NOTE — Procedures (Signed)
CLINICAL DATA: Left knee pain  EXAM: LEFT KNEE INJECTION UNDER FLUOROSCOPY  FLUOROSCOPY TIME: Fluoroscopy Time:  0 MINUTES, 6 SECONDS  Radiation Exposure Index (if provided by the fluoroscopic device):  0.8 mGy  Number of Acquired Spot Images: 0  PROCEDURE:  I discussed the risks (including hemorrhage and infection, among others), benefits, and alternatives to fluoroscopically guided therapeutic left knee injection with the patient.  We specifically discussed the high technical likelihood of success of the procedure. The patient understood and elected to undergo the procedure.    Standard time-out was employed.  The laterality was confirmed and skin site marked.  Following sterile skin prep and local anesthetic administration consisting of 1 percent lidocaine, a 21 gauge spinal needle was advanced without difficulty into the knee joint from a lateral retropatellar approach.  Positioning in the joint was confirmed by injection of 3 cc of Omnipaque 180 contrast.   A well-mixed mixture of 80mg  Depo-Medrol and 5 ml Sensorcaine 0.5% were then administered.   The needle was subsequently removed and the skin cleansed and bandaged.  No immediate complications were observed.    Note, there is a calcification projecting above the patella on the frontal projection, and intra-articular free osteochondral fragment in the vicinity of the suprapatellar bursa is not excluded.   IMPRESSION: Technically successful left knee therapeutic injection with fluoroscopic guidance.

## 2020-03-21 ENCOUNTER — Ambulatory Visit (HOSPITAL_COMMUNITY)
Admission: RE | Admit: 2020-03-21 | Discharge: 2020-03-21 | Disposition: A | Discharge status: Home / Self Care | Payer: Medicare Other | Source: Ambulatory Visit | Attending: Internal Medicine | Admitting: Internal Medicine

## 2020-04-17 DIAGNOSIS — H40023 Open angle with borderline findings, high risk, bilateral: Secondary | ICD-10-CM | POA: Diagnosis not present

## 2020-04-17 DIAGNOSIS — H26493 Other secondary cataract, bilateral: Secondary | ICD-10-CM | POA: Diagnosis not present

## 2020-04-17 DIAGNOSIS — H1045 Other chronic allergic conjunctivitis: Secondary | ICD-10-CM | POA: Diagnosis not present

## 2020-07-05 IMAGING — DX ABDOMEN - 1 VIEW
1 series · 1 of 1 positions shown · non-contrast
Comparison: CT 03/28/2019.

CLINICAL DATA: Perforation of stomach after ingestion of foreign
material. NG tube placement.

EXAM:
ABDOMEN - 1 VIEW

[abdomen kub]
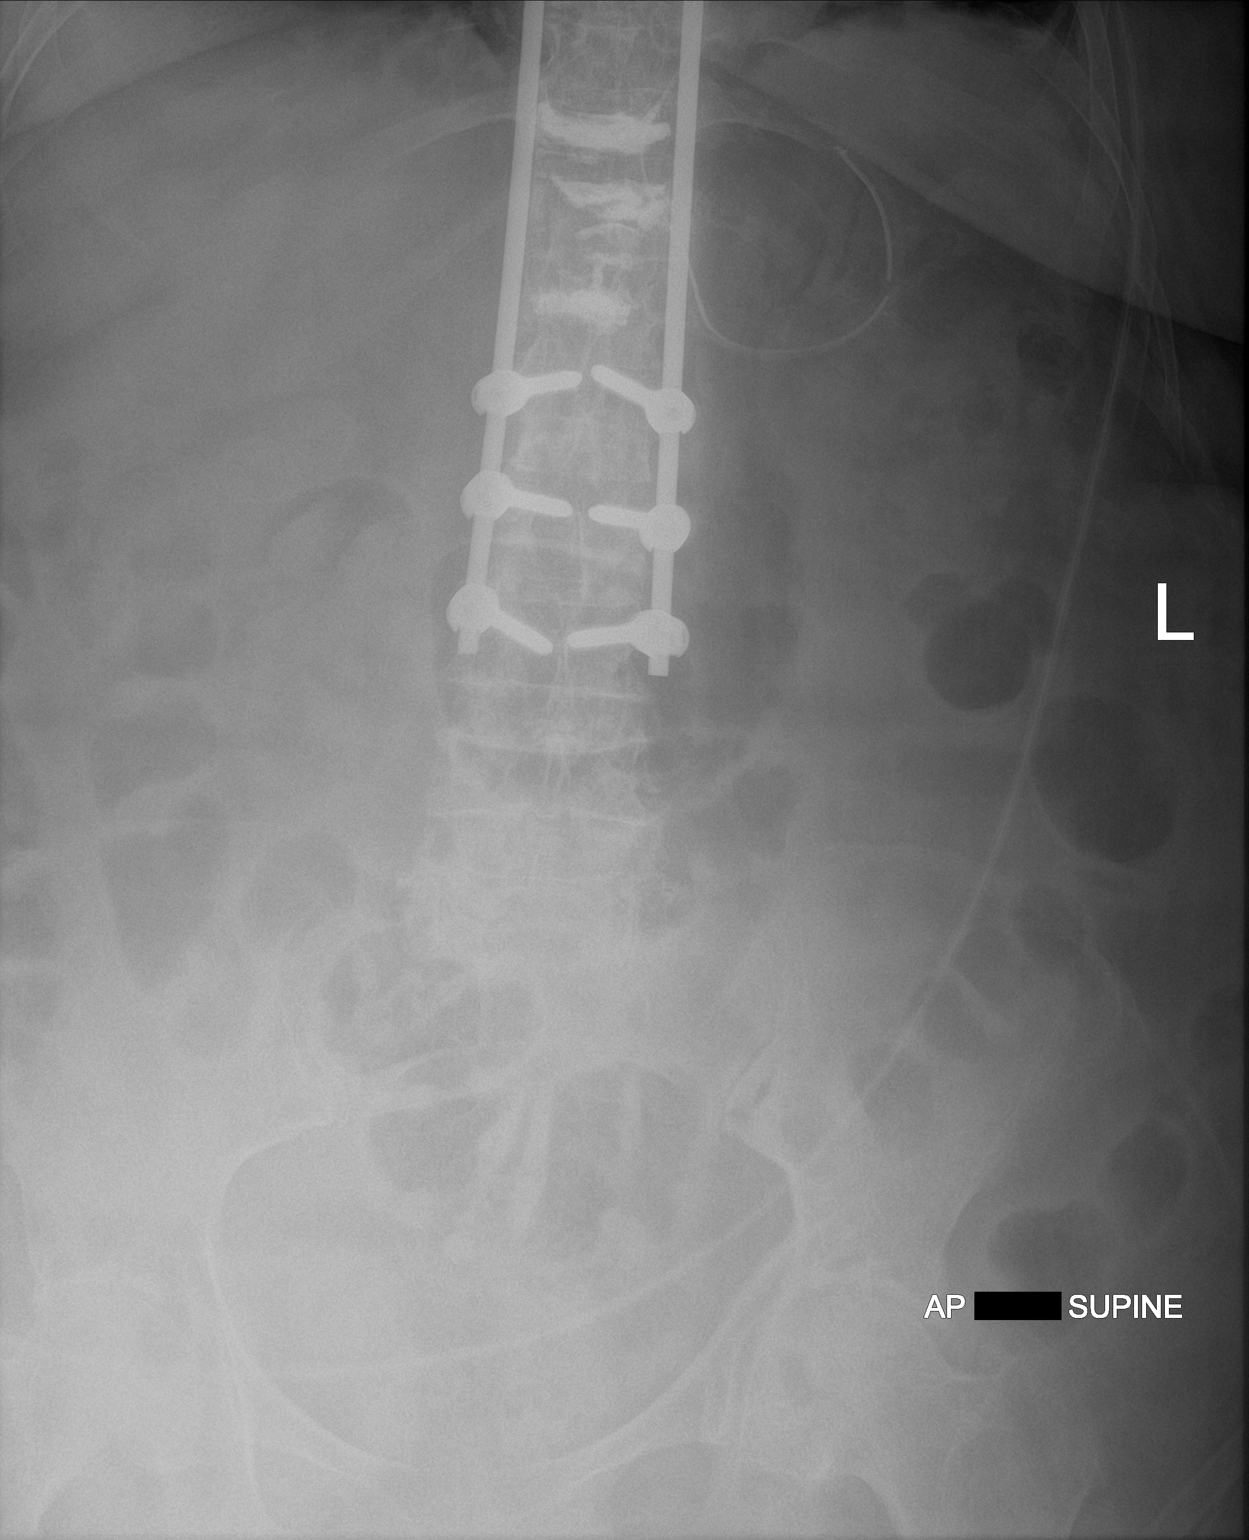

[1 of 1 positions shown; findings below may reference images not displayed]

FINDINGS: NG tube noted coiled in the left upper quadrant most likely in the
fundus of the stomach. No gastric distention. No bowel distention.
No free air identified. Prior thoracolumbar spine fusion. Prior
thoracic and lumbar vertebroplasties.
IMPRESSION: NG tube noted coiled of the fundus of the stomach. No gastric
distention.

## 2020-07-08 IMAGING — DX PORTABLE CHEST - 1 VIEW
1 series · 1 of 1 positions shown · non-contrast
Comparison: 03/31/2019

CLINICAL DATA: Shortness of breath

EXAM:
PORTABLE CHEST 1 VIEW

[chest ap]
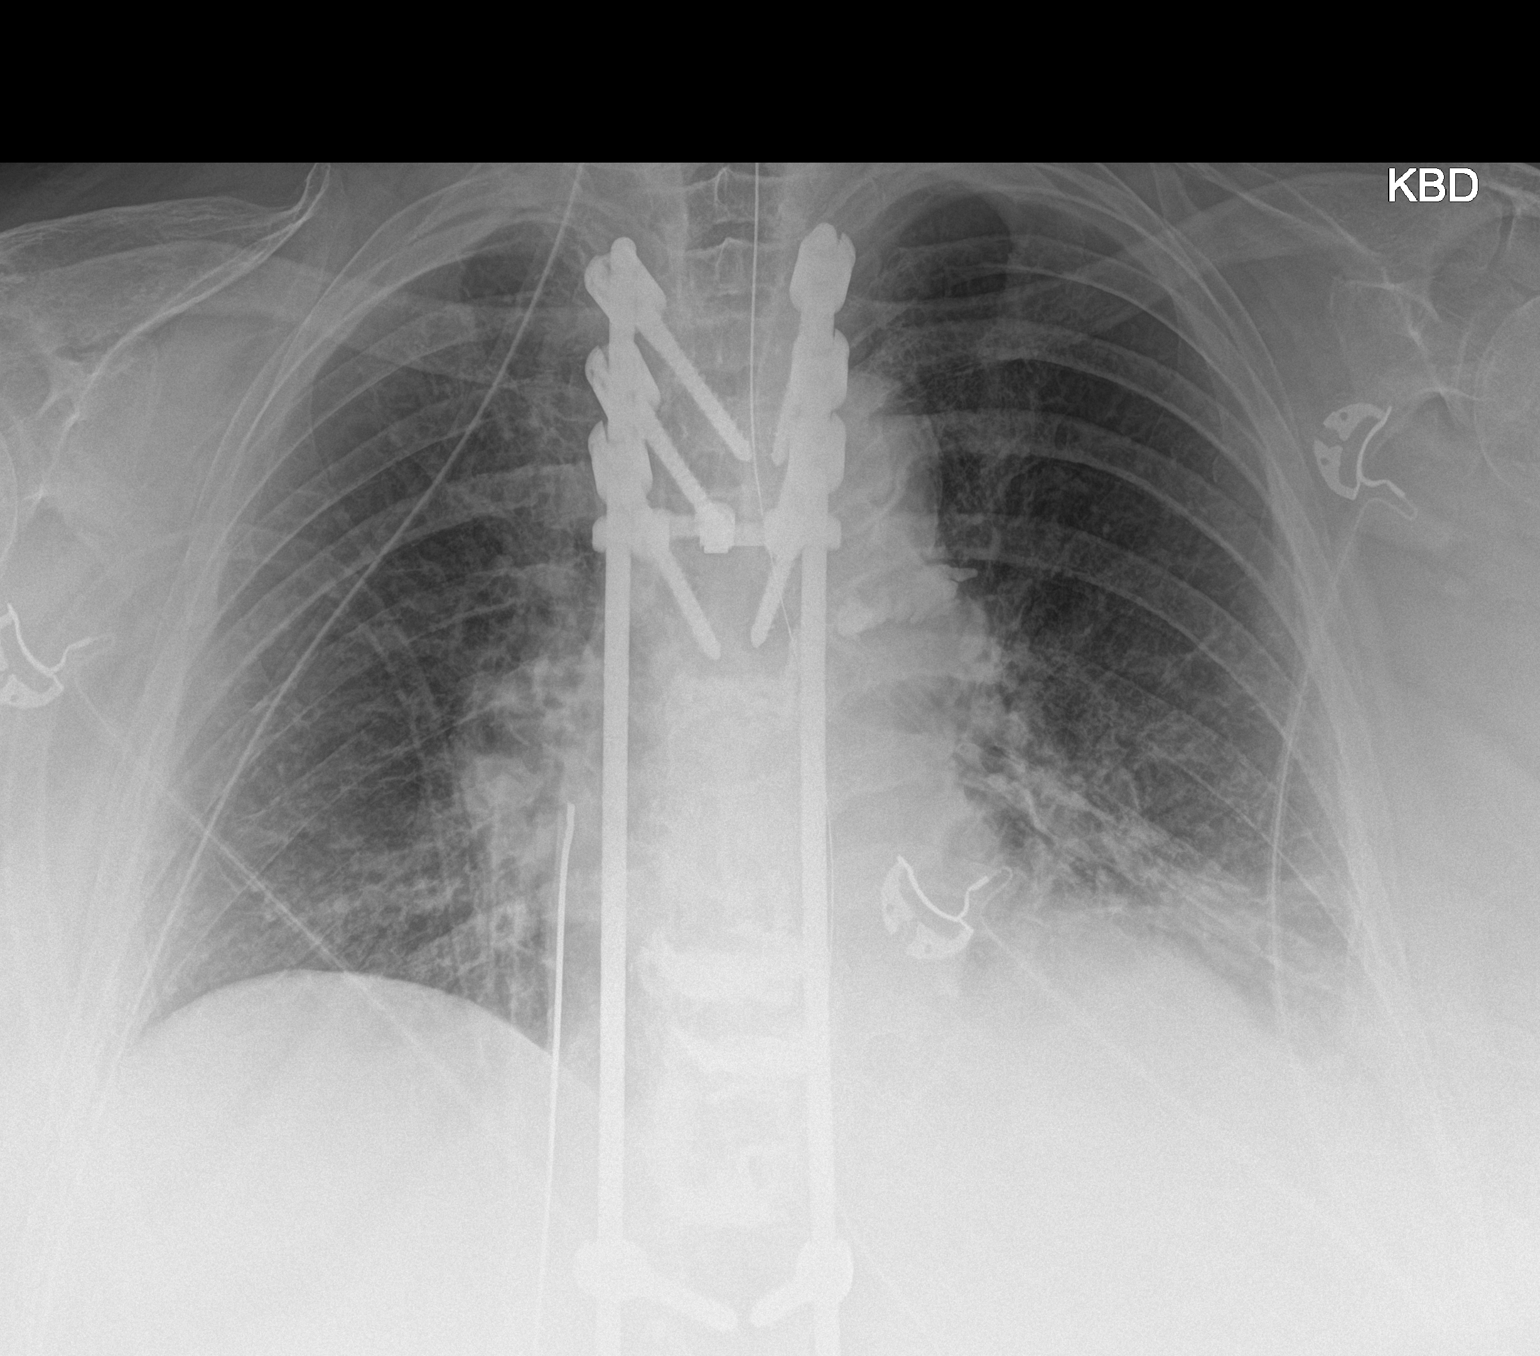

[1 of 1 positions shown; findings below may reference images not displayed]

FINDINGS: An NG tube is noted entering the stomach with tip off field of view.

Slightly improved LEFT basilar aeration with continued
atelectasis/airspace disease.

No pneumothorax or definite pleural effusion.

Spinal surgical hardware and evidence of thoracic vertebral
augmentation again noted.
IMPRESSION: Improved LEFT basilar aeration without other significant change.

## 2020-09-26 DIAGNOSIS — Z1231 Encounter for screening mammogram for malignant neoplasm of breast: Secondary | ICD-10-CM | POA: Diagnosis not present

## 2020-10-21 DIAGNOSIS — J189 Pneumonia, unspecified organism: Secondary | ICD-10-CM | POA: Diagnosis not present

## 2020-10-21 DIAGNOSIS — N1831 Chronic kidney disease, stage 3a: Secondary | ICD-10-CM | POA: Diagnosis not present

## 2020-10-21 DIAGNOSIS — J449 Chronic obstructive pulmonary disease, unspecified: Secondary | ICD-10-CM | POA: Diagnosis not present

## 2020-10-21 DIAGNOSIS — Z1152 Encounter for screening for COVID-19: Secondary | ICD-10-CM | POA: Diagnosis not present

## 2020-10-21 DIAGNOSIS — I129 Hypertensive chronic kidney disease with stage 1 through stage 4 chronic kidney disease, or unspecified chronic kidney disease: Secondary | ICD-10-CM | POA: Diagnosis not present

## 2020-10-21 DIAGNOSIS — R059 Cough, unspecified: Secondary | ICD-10-CM | POA: Diagnosis not present

## 2020-10-22 ENCOUNTER — Ambulatory Visit (INDEPENDENT_AMBULATORY_CARE_PROVIDER_SITE_OTHER): Payer: Medicare Other | Admitting: Pulmonary Disease

## 2020-10-22 ENCOUNTER — Encounter: Payer: Self-pay | Admitting: Pulmonary Disease

## 2020-10-22 ENCOUNTER — Other Ambulatory Visit: Payer: Self-pay

## 2020-10-22 VITALS — BP 134/68 | HR 81 | Temp 97.3°F | Ht 61.0 in | Wt 264.6 lb

## 2020-10-22 DIAGNOSIS — R0609 Other forms of dyspnea: Secondary | ICD-10-CM

## 2020-10-22 DIAGNOSIS — R0600 Dyspnea, unspecified: Secondary | ICD-10-CM

## 2020-10-22 DIAGNOSIS — J4541 Moderate persistent asthma with (acute) exacerbation: Secondary | ICD-10-CM | POA: Diagnosis not present

## 2020-10-22 DIAGNOSIS — M7989 Other specified soft tissue disorders: Secondary | ICD-10-CM | POA: Diagnosis not present

## 2020-10-22 MED ORDER — BUDESONIDE 0.5 MG/2ML IN SUSP: 0.5000 mg | Freq: Two times a day (BID) | RESPIRATORY_TRACT | 11 refills | Status: DC

## 2020-10-22 MED ORDER — FORMOTEROL FUMARATE 20 MCG/2ML IN NEBU: 20.0000 ug | INHALATION_SOLUTION | Freq: Two times a day (BID) | RESPIRATORY_TRACT | 11 refills | Status: DC

## 2020-10-22 NOTE — Patient Instructions (Addendum)
Nice to meet you  I am sorry the breathing is getting such a hard time.  I do worry that the asthma is poorly controlled.  I am glad the prednisone works, but we need to find a solution to keep you off prednisone to keep your bones healthy, your sugar down, your weight down.    Stop using Trelegy.  Use budesonide and formoterol nebulized twice a day.  You can mix these 2 medicines.  The reason for switching to this is to guarantee that the medicines to treat inflammation and wheezing get in your lungs.  I worry sometimes that with using inhalers the medicine does not get where it needs to get.  I will order the heart ultrasound to evaluate other causes or mimics of wheezing to make sure were not missing something else.   We will also get ultrasound of both of your legs to make sure is no blood clot.  Return to clinic in 6 weeks.  At that time if you have been off prednisone, we will get blood work to see if he would benefit from additional medicines for asthma.

## 2020-10-24 ENCOUNTER — Ambulatory Visit (HOSPITAL_COMMUNITY)
Admission: RE | Admit: 2020-10-24 | Discharge: 2020-10-24 | Disposition: A | Discharge status: Home / Self Care | Payer: Medicare Other | Source: Ambulatory Visit | Attending: Pulmonary Disease | Admitting: Pulmonary Disease

## 2020-10-24 ENCOUNTER — Other Ambulatory Visit: Payer: Self-pay

## 2020-10-24 DIAGNOSIS — M7989 Other specified soft tissue disorders: Secondary | ICD-10-CM | POA: Insufficient documentation

## 2020-10-24 NOTE — Progress Notes (Signed)
Bilateral lower extremity venous study completed.      Please see CV Proc for preliminary results.   Anapaola Kinsel, RVT  

## 2020-10-25 NOTE — Progress Notes (Signed)
@Patient  ID: [name], female    DOB: Apr 09, 1941, 80 y.o.   MRN: [id number]  Chief Complaint  Patient presents with  . Consult    Shortness of breath at rest, worse with activity. Productive cough with clear to yellowish phlegm, started in January.     Referring provider: Leanna Battles, MD  HPI:   80 year old whom we are seeing for evaluation of dyspnea on exertion, cough.  Note from PCP reviewed.  Notes from Covid clinic reviewed.  Patient contracted Covid 07/2019.  She does stay at home.  She got monoclonal antibody infusion.  Since then, she has had about every month exacerbation of her underlying with presumably asthma.  Wheeze, worsening dyspnea exertion, cough.  She has gotten a course of prednisone and antibiotics.  Been told she has pneumonia.  Presently based on chest x-rays that I cannot observe or view.    Cough is productive.  Green to yellowish phlegm.  Seems may be worse in the mornings and evenings.  No environmental or seasonal changes.  No other alleviating or exacerbating factors.  Dyspnea on exertion obviously worse when she exerts her self,, better with rest.  Has developed shortness of breath at rest as well.  No timing.  They were things are better or worse.  No alleviating or exacerbating factors.  She has been using Trelegy and does not think it helps.  Most recent chest x-ray 07/2019 reviewed which reveals clear lungs, streaky opacity left base, heart size appears enlarged on my interpretation.   PMH: Asthma, sleep apnea, back pain Surgical history: C-section, knee replacement, hysterectomy, Family history: Mother with breast cancer, father with CAD, CVA   Questionaires / Pulmonary Flowsheets:   ACT:  Asthma Control Test ACT Total Score  10/22/2020 7    MMRC: No flowsheet data found.  Epworth:  No flowsheet data found.  Tests:   FENO:  No results found for: NITRICOXIDE  PFT: No flowsheet data found.  WALK:  No flowsheet data  found.  Imaging: Personally reviewed and as per EMR and discussion of this note VAS [place] LOWER EXTREMITY VENOUS (DVT)  Result Date: 10/24/2020  Lower Venous DVT Study Indications: Swelling.  Risk Factors: None identified. Limitations: Body habitus. Comparison Study: 12/17 Negative Performing Technologist: Vonzell Schlatter RVT  Examination Guidelines: A complete evaluation includes B-mode imaging, spectral Doppler, color Doppler, and power Doppler as needed of all accessible portions of each vessel. Bilateral testing is considered an integral part of a complete examination. Limited examinations for reoccurring indications may be performed as noted. The reflux portion of the exam is performed with the patient in reverse Trendelenburg.  +---------+---------------+---------+-----------+----------+-------------------+ RIGHT    CompressibilityPhasicitySpontaneityPropertiesThrombus Aging      +---------+---------------+---------+-----------+----------+-------------------+ CFV      Full           Yes      Yes                                      +---------+---------------+---------+-----------+----------+-------------------+ SFJ      Full                                                             +---------+---------------+---------+-----------+----------+-------------------+ FV Prox  Full                                                             +---------+---------------+---------+-----------+----------+-------------------+  FV Mid   Full                                                             +---------+---------------+---------+-----------+----------+-------------------+ FV DistalFull                                                             +---------+---------------+---------+-----------+----------+-------------------+ PFV      Full                                                              +---------+---------------+---------+-----------+----------+-------------------+ POP      Full           Yes      Yes                                      +---------+---------------+---------+-----------+----------+-------------------+ PTV      Full                                                             +---------+---------------+---------+-----------+----------+-------------------+ PERO                                                  Not well visualized +---------+---------------+---------+-----------+----------+-------------------+  Right Technical Findings: Not visualized segments include Peroneals.  +---------+---------------+---------+-----------+----------+-------------------+ LEFT     CompressibilityPhasicitySpontaneityPropertiesThrombus Aging      +---------+---------------+---------+-----------+----------+-------------------+ CFV      Full           Yes      Yes                                      +---------+---------------+---------+-----------+----------+-------------------+ SFJ      Full                                                             +---------+---------------+---------+-----------+----------+-------------------+ FV Prox  Full                                                             +---------+---------------+---------+-----------+----------+-------------------+  FV Mid   Full                                                             +---------+---------------+---------+-----------+----------+-------------------+ FV DistalFull                                                             +---------+---------------+---------+-----------+----------+-------------------+ PFV      Full                                                             +---------+---------------+---------+-----------+----------+-------------------+ POP      Full           Yes      Yes                                       +---------+---------------+---------+-----------+----------+-------------------+ PTV      Full                                                             +---------+---------------+---------+-----------+----------+-------------------+ PERO                                                  Not well visualized +---------+---------------+---------+-----------+----------+-------------------+  Left Technical Findings: Not visualized segments include Peroneals.  Summary: RIGHT: - There is no evidence of deep vein thrombosis in the lower extremity. However, portions of this examination were limited- see technologist comments above. - There is no evidence of superficial venous thrombosis.  - No cystic structure found in the popliteal fossa.  LEFT: - There is no evidence of deep vein thrombosis in the lower extremity. However, portions of this examination were limited- see technologist comments above. - There is no evidence of superficial venous thrombosis.  - No cystic structure found in the popliteal fossa.  *See table(s) above for measurements and observations. Electronically signed by Jamelle Haring on 10/24/2020 at 6:58:13 PM.    Final     Lab Results: Personally reviewed CBC    Component Value Date/Time   WBC 6.9 07/20/2019 1621   RBC 4.16 07/20/2019 1621   HGB 10.1 (L) 07/20/2019 1621   HGB 11.2 (L) 11/18/2008 1132   HCT 34.8 (L) 07/20/2019 1621   HCT 33.6 (L) 11/18/2008 1132   PLT 354 07/20/2019 1621   PLT 235 11/18/2008 1132   MCV 83.7 07/20/2019 1621   MCV 91.2 11/18/2008 1132   MCH 24.3 (L) 07/20/2019 1621   MCHC 29.0 (L) 07/20/2019 1621   RDW  16.4 (H) 07/20/2019 1621   RDW 14.7 (H) 11/18/2008 1132   LYMPHSABS 1.5 07/20/2019 1621   LYMPHSABS 0.5 (L) 11/18/2008 1132   MONOABS 1.1 (H) 07/20/2019 1621   MONOABS 0.0 (L) 11/18/2008 1132   EOSABS 0.1 07/20/2019 1621   EOSABS 0.5 11/18/2008 1132   BASOSABS 0.1 07/20/2019 1621   BASOSABS 0.0 11/18/2008 1132    BMET     Component Value Date/Time   NA 136 07/20/2019 1621   K 3.7 07/20/2019 1621   CL 98 07/20/2019 1621   CO2 30 07/20/2019 1621   GLUCOSE 108 (H) 07/20/2019 1621   BUN 15 07/20/2019 1621   CREATININE 0.93 07/20/2019 1621   CALCIUM 8.8 (L) 07/20/2019 1621   GFRNONAA 58 (L) 07/20/2019 1621   GFRAA >60 07/20/2019 1621    BNP    Component Value Date/Time   BNP 74.2 01/31/2017 1655    ProBNP    Component Value Date/Time   PROBNP 240.0 (H) 06/18/2008 1657    Specialty Problems      Pulmonary Problems   Complex sleep apnea syndrome   Sleeps in sitting position due to orthopnea   Moderate persistent asthma      No Known Allergies  Immunization History  Administered Date(s) Administered  . Influenza Split 05/13/2011, 05/25/2012  . Influenza,inj,quad, With Preservative 06/14/2013  . Influenza-Unspecified 05/02/2017  . Pneumococcal Polysaccharide-23 07/20/2011    Past Medical History:  Diagnosis Date  . Anxiety   . Arthritis   . Asthma    hx of  . Back pain   . Chronic constipation   . Depression    hx of  . Dyspnea   . Family history of adverse reaction to anesthesia    DAD bp ELEVATED   . GERD (gastroesophageal reflux disease)   . High cholesterol   . Hypertension   . Lymphoma ([place])   . Obesity   . Sleep apnea    uses a CPAP  . Stroke ([place])    2010  . SVT (supraventricular tachycardia) ([facility])     Tobacco History: Social History   Tobacco Use  Smoking Status Never Smoker  Smokeless Tobacco Never Used   Counseling given: Not Answered   Continue to not smoke  Outpatient Encounter Medications as of 10/22/2020  Medication Sig  . acetaminophen (TYLENOL) 500 MG tablet Take 1,000 mg by mouth 2 (two) times daily.  Marland Kitchen albuterol (VENTOLIN HFA) 108 (90 Base) MCG/ACT inhaler Inhale 2 puffs into the lungs every 6 (six) hours as needed for wheezing or shortness of breath.   . ASPERCREME LIDOCAINE EX Apply 1 application topically 2 (two) times daily as needed  (knee pain).   . benzonatate (TESSALON) 100 MG capsule Take 1 capsule (100 mg total) by mouth every 8 (eight) hours.  . budesonide (PULMICORT) 0.5 MG/2ML nebulizer solution Take 2 mLs (0.5 mg total) by nebulization in the morning and at bedtime.  . DULoxetine (CYMBALTA) 60 MG capsule Take 60 mg by mouth daily.  . formoterol (PERFOROMIST) 20 MCG/2ML nebulizer solution Take 2 mLs (20 mcg total) by nebulization 2 (two) times daily. J45. 909 Run Part B  . furosemide (LASIX) 40 MG tablet Take 40 mg by mouth daily as needed (for fluid retention.).   Marland Kitchen guaiFENesin (MUCINEX) 600 MG 12 hr tablet Take by mouth 2 (two) times daily as needed.  Marland Kitchen ipratropium (ATROVENT) 0.02 % nebulizer solution Take 0.5 mg by nebulization every 4 (four) hours as needed for wheezing or shortness of breath.  Marland Kitchen  lubiprostone (AMITIZA) 24 MCG capsule Take 24 mcg by mouth daily.  . Menthol, Topical Analgesic, 4 % GEL Apply 1 application topically 2 (two) times daily as needed (knee pain).   . metoprolol tartrate (LOPRESSOR) 50 MG tablet Take 50 mg by mouth 2 (two) times daily.  . Multiple Vitamin (MULTIVITAMIN WITH MINERALS) TABS tablet Take 1 tablet by mouth daily.  . Olmesartan-amLODIPine-HCTZ 40-5-12.5 MG TABS Take 1 tablet by mouth daily at 12 noon.  . ondansetron (ZOFRAN) 4 MG tablet Take 1 tablet (4 mg total) by mouth every 8 (eight) hours as needed for nausea or vomiting.  . potassium chloride SA (K-DUR,KLOR-CON) 20 MEQ tablet Take 20 mEq by mouth daily as needed (With lasix).   . pravastatin (PRAVACHOL) 20 MG tablet Take 20 mg by mouth daily.  . predniSONE (DELTASONE) 10 MG tablet Take 10 mg by mouth daily as needed (asthma).   Marland Kitchen PRESCRIPTION MEDICATION Inhale into the lungs at bedtime. CPAP  . Vitamin D, Ergocalciferol, (DRISDOL) 50000 UNITS CAPS Take 50,000 Units by mouth every Monday.   . [DISCONTINUED] Fluticasone-Umeclidin-Vilant 100-62.5-25 MCG/INH AEPB Inhale 1 puff into the lungs daily.  . [DISCONTINUED]  levofloxacin (LEVAQUIN) 500 MG tablet Take 500 mg by mouth daily.  . metoprolol tartrate (LOPRESSOR) 25 MG tablet Take 2 tablets (50 mg total) by mouth 2 (two) times daily.  . montelukast (SINGULAIR) 10 MG tablet Take 10 mg by mouth daily.  (Patient not taking: Reported on 10/22/2020)   No facility-administered encounter medications on file as of 10/22/2020.     Review of Systems  Review of Systems   Physical Exam  BP 134/68 (BP Location: Right Wrist, Cuff Size: Normal)   Pulse 81   Temp (!) 97.3 F (36.3 C) (Temporal)   Ht 5\' 1"  (1.549 m)   Wt 264 lb 9.6 oz (120 kg)   SpO2 94%   BMI 50.00 kg/m   Wt Readings from Last 5 Encounters:  10/22/20 264 lb 9.6 oz (120 kg)  05/22/19 267 lb (121.1 kg)  05/16/19 278 lb 12.8 oz (126.5 kg)  04/03/19 290 lb 2 oz (131.6 kg)  04/27/18 274 lb (124.3 kg)    BMI Readings from Last 5 Encounters:  10/22/20 50.00 kg/m  05/22/19 50.45 kg/m  05/16/19 54.45 kg/m  04/03/19 54.82 kg/m  04/27/18 47.03 kg/m     Physical Exam General: Well-appearing, no acute distress Eyes: EOMI, no icterus Neck: Supple, no JVP Pulmonary: Diffuse end expiratory wheeze bilaterally, normal work of breathing Cardiovascular: Regular rate and rhythm, no murmurs Abdomen: Nondistended, bowel sounds present MSK: No synovitis, joint effusion Neuro: Seated in wheelchair, no focal weakness, sensation intact Psych: Normal mood, full affect   Assessment & Plan:   Dyspnea exertion: Likely multifactorial related to deconditioning, obesity, concern for uncontrolled asthma given multiple prednisone courses with mild improvement each time, possible other cardiac etiologies.  Echocardiogram ordered.  Please see below for asthma.  Asthma: Worsening dyspnea on exertion, shortness of breath, cough.  Has had multiple at least 3 prednisone courses over the last 3 months with mild improvement each time with resumption of uncontrolled symptoms shortly thereafter.  On Trelegy.   Worried DPI is not being deposited in lungs due to not using correctly or lack of inspiratory force needed. Start neb ICS/LABA. Would benefit from phenotyping but will be inaccurate as is on prednisone.   LE swelling. Worry about cardiac dysfunction. TTE as above. Bilateral dopplers to eval VTE,   Return in about 6 weeks (around 12/03/2020).  Lanier Clam, MD 10/25/2020   This appointment required 85 minutes of patient care (this includes precharting, chart review, review of results, face-to-face care, etc.).

## 2020-10-28 ENCOUNTER — Telehealth: Payer: Self-pay | Admitting: Pulmonary Disease

## 2020-10-28 NOTE — Telephone Encounter (Signed)
Left message for patient to call back  

## 2020-10-29 ENCOUNTER — Encounter: Payer: Self-pay | Admitting: Acute Care

## 2020-10-29 ENCOUNTER — Ambulatory Visit (INDEPENDENT_AMBULATORY_CARE_PROVIDER_SITE_OTHER): Payer: Medicare Other | Admitting: Acute Care

## 2020-10-29 ENCOUNTER — Other Ambulatory Visit: Payer: Self-pay

## 2020-10-29 VITALS — BP 122/84 | HR 83 | Temp 98.0°F | Ht 61.0 in | Wt 260.1 lb

## 2020-10-29 DIAGNOSIS — J4541 Moderate persistent asthma with (acute) exacerbation: Secondary | ICD-10-CM | POA: Diagnosis not present

## 2020-10-29 DIAGNOSIS — G4733 Obstructive sleep apnea (adult) (pediatric): Secondary | ICD-10-CM | POA: Diagnosis not present

## 2020-10-29 DIAGNOSIS — I2721 Secondary pulmonary arterial hypertension: Secondary | ICD-10-CM | POA: Diagnosis not present

## 2020-10-29 DIAGNOSIS — R0609 Other forms of dyspnea: Secondary | ICD-10-CM

## 2020-10-29 DIAGNOSIS — Z9989 Dependence on other enabling machines and devices: Secondary | ICD-10-CM | POA: Diagnosis not present

## 2020-10-29 DIAGNOSIS — R0600 Dyspnea, unspecified: Secondary | ICD-10-CM | POA: Diagnosis not present

## 2020-10-29 NOTE — Telephone Encounter (Signed)
PA request was received from (pharmacy): [facility] in [place] Phone: [phone] Fax:  Medication name and strength: Pulmicort 0.5mg  Ordering Provider: [place]  Was PA started with CMM?: Yes If yes, please enter KEY: [id number] Medication tried and failed: Breo 128mcg, Trelegy 135mcg, Incruse, Xopenex, Atrovent and Symbicort 160. Covered Alternatives:  PA sent to plan, time frame for approval / denial:  Routing to myself for follow-up

## 2020-10-29 NOTE — Patient Instructions (Addendum)
We will restart Trelegy until you get your nebs Dr. Silas Flood ordered.  1 Puff once daily Rinse mouth after use.  Continue your albuterol as rescue for breakthrough shortness of breath or wheezing.  Your Doppler studies were negative for clot. This is good news Echo is scheduled for 11/03/2020. Check your oxygen levels while you are walking to see if your oxygen saturations drop below 88%. We will schedule you for a walk after you have been on your maintenance medication for a few days.  As soon as you get the nebulizer medications , start using them and stop the Trelegy. Follow up with Dr. Silas Flood in 5 weeks per Dr. Daryl Eastern note. Please contact office for sooner follow up if symptoms do not improve or worsen or seek emergency care

## 2020-10-29 NOTE — Progress Notes (Signed)
History of Present Illness [name] is a 80 y.o. female never smoker with history of asthma, Covid 19 07/2019, and dyspnea on exertion, and productive cough. She is followed by Dr. Silas Flood.  Synopsis  80 y.o. female never smoker with history of asthma and Covid 19 infection. 07/2019. She received  monoclonal antibody infusion at the time. Since then, she has had about every month exacerbation of her underlying with presumably asthma.  Wheeze, worsening dyspnea exertion, cough.  She has received multiple  courses of prednisone and antibiotics. After Dr. Kavin Leech evaluation 10/22/2020, he felt her dyspnea was multifactorial  related to deconditioning, obesity, concern for uncontrolled underlying asthma given multiple prednisone courses with mild improvement each time,and  possible other cardiac etiologies.  She  has had  at least 3 prednisone courses over the last 3 months with mild improvement each time with resumption of uncontrolled symptoms shortly thereafter. She is on Trelegy as maintenance . Dr. Silas Flood was concerned DPI mode of treatment is not being deposited in lungs due to not using correctly or lack of inspiratory force needed. Plan was to transition for DPI to maintenance  ICS/LABA nebs., repeat an echo, and consider phenotyping once she is off prednisone. Pt. Had LE swelling, Dopplers are negative for VTE.Echo is pending. Previous echo ( 01/2017) shows PA peak pressure of 48 mm Hg, which could also be contributing to her dyspnea. She has OSA/ OHS and is compliant with her CPAP therapy with oxygen every night.    10/29/2020   Pt. Presents for worsening  wheezing since seeing Dr. Silas Flood 3/23. She states she stopped taking her maintenance Trelegy inhaler 3/24 , before  getting the maintenance nebulizer treatments Dr. Silas Flood ordered . There has been a delay in getting maintenance nebs due to issues with  insurance coverage . She has been off maintenance therapy x 1 week.  She  states she has wheezing , cough and  dyspnea, and this is much worse off Trelegy. Cough is productive for clear secretions. We have discussed resuming her Trelegy until she has her nebulizer treatments in hand. We discussed using maintenance medication every day regardless of if you feel good or bad, as it will help maintain airways. Often times good days are due to the fact the maintenance medications are working well. This week off maintenance therapy confirms the need for her to continue it every day without fail.   I have asked her to call the office to let us know if she continues to have issues with getting her  maintenance  ICS/LABA nebs.  She denies fever, chest pain, or hemoptysis. She endorses drops in her saturations to mid 80's  with walking to her car. She will need to be walked to see if she qualifies for oxygen once this flair is controlled.   Test Results: ACT:  Asthma Control Test ACT Total Score  10/22/2020 7   Echo 01/2017 EF 34-19% grade 1 diastolic dysfunction MV Calcified annulus, mild regurgitation Trace TR with PA peak Pressure 48 mm Hg  CBC Latest Ref Rng & Units 07/20/2019 04/03/2019 04/02/2019  WBC 4.0 - 10.5 K/uL 6.9 8.5 8.3  Hemoglobin 12.0 - 15.0 g/dL 10.1(L) 9.4(L) 8.3(L)  Hematocrit 36.0 - 46.0 % 34.8(L) 31.0(L) 26.5(L)  Platelets 150 - 400 K/uL 354 463(H) 356    BMP Latest Ref Rng & Units 07/20/2019 04/04/2019 04/03/2019  Glucose 70 - 99 mg/dL 108(H) 168(H) 167(H)  BUN 8 - 23 mg/dL 15 <5(L) 6(L)  Creatinine 0.44 -  1.00 mg/dL 0.93 0.71 0.85  Sodium 135 - 145 mmol/L 136 140 137  Potassium 3.5 - 5.1 mmol/L 3.7 3.7 2.9(L)  Chloride 98 - 111 mmol/L 98 101 95(L)  CO2 22 - 32 mmol/L 30 27 30   Calcium 8.9 - 10.3 mg/dL 8.8(L) 8.6(L) 8.5(L)    BNP    Component Value Date/Time   BNP 74.2 01/31/2017 1655    ProBNP    Component Value Date/Time   PROBNP 240.0 (H) 06/18/2008 1657    PFT No results found for: FEV1PRE, FEV1POST, FVCPRE, FVCPOST, TLC, DLCOUNC,  PREFEV1FVCRT, PSTFEV1FVCRT  VAS [place] LOWER EXTREMITY VENOUS (DVT)  Result Date: 10/24/2020  Lower Venous DVT Study Indications: Swelling.  Risk Factors: None identified. Limitations: Body habitus. Comparison Study: 12/17 Negative Performing Technologist: Vonzell Schlatter RVT  Examination Guidelines: A complete evaluation includes B-mode imaging, spectral Doppler, color Doppler, and power Doppler as needed of all accessible portions of each vessel. Bilateral testing is considered an integral part of a complete examination. Limited examinations for reoccurring indications may be performed as noted. The reflux portion of the exam is performed with the patient in reverse Trendelenburg.  +---------+---------------+---------+-----------+----------+-------------------+ RIGHT    CompressibilityPhasicitySpontaneityPropertiesThrombus Aging      +---------+---------------+---------+-----------+----------+-------------------+ CFV      Full           Yes      Yes                                      +---------+---------------+---------+-----------+----------+-------------------+ SFJ      Full                                                             +---------+---------------+---------+-----------+----------+-------------------+ FV Prox  Full                                                             +---------+---------------+---------+-----------+----------+-------------------+ FV Mid   Full                                                             +---------+---------------+---------+-----------+----------+-------------------+ FV DistalFull                                                             +---------+---------------+---------+-----------+----------+-------------------+ PFV      Full                                                             +---------+---------------+---------+-----------+----------+-------------------+  POP      Full           Yes      Yes                                       +---------+---------------+---------+-----------+----------+-------------------+ PTV      Full                                                             +---------+---------------+---------+-----------+----------+-------------------+ PERO                                                  Not well visualized +---------+---------------+---------+-----------+----------+-------------------+  Right Technical Findings: Not visualized segments include Peroneals.  +---------+---------------+---------+-----------+----------+-------------------+ LEFT     CompressibilityPhasicitySpontaneityPropertiesThrombus Aging      +---------+---------------+---------+-----------+----------+-------------------+ CFV      Full           Yes      Yes                                      +---------+---------------+---------+-----------+----------+-------------------+ SFJ      Full                                                             +---------+---------------+---------+-----------+----------+-------------------+ FV Prox  Full                                                             +---------+---------------+---------+-----------+----------+-------------------+ FV Mid   Full                                                             +---------+---------------+---------+-----------+----------+-------------------+ FV DistalFull                                                             +---------+---------------+---------+-----------+----------+-------------------+ PFV      Full                                                             +---------+---------------+---------+-----------+----------+-------------------+  POP      Full           Yes      Yes                                      +---------+---------------+---------+-----------+----------+-------------------+ PTV      Full                                                              +---------+---------------+---------+-----------+----------+-------------------+ PERO                                                  Not well visualized +---------+---------------+---------+-----------+----------+-------------------+  Left Technical Findings: Not visualized segments include Peroneals.  Summary: RIGHT: - There is no evidence of deep vein thrombosis in the lower extremity. However, portions of this examination were limited- see technologist comments above. - There is no evidence of superficial venous thrombosis.  - No cystic structure found in the popliteal fossa.  LEFT: - There is no evidence of deep vein thrombosis in the lower extremity. However, portions of this examination were limited- see technologist comments above. - There is no evidence of superficial venous thrombosis.  - No cystic structure found in the popliteal fossa.  *See table(s) above for measurements and observations. Electronically signed by Jamelle Haring on 10/24/2020 at 59:58:13 PM.    Final      Past medical hx Past Medical History:  Diagnosis Date  . Anxiety   . Arthritis   . Asthma    hx of  . Back pain   . Chronic constipation   . Depression    hx of  . Dyspnea   . Family history of adverse reaction to anesthesia    DAD bp ELEVATED   . GERD (gastroesophageal reflux disease)   . High cholesterol   . Hypertension   . Lymphoma ([place])   . Obesity   . Sleep apnea    uses a CPAP  . Stroke ([place])    2010  . SVT (supraventricular tachycardia) ([facility])      Social History   Tobacco Use  . Smoking status: Never Smoker  . Smokeless tobacco: Never Used  Vaping Use  . Vaping Use: Never used  Substance Use Topics  . Alcohol use: No  . Drug use: No    Ms.Helling reports that she has never smoked. She has never used smokeless tobacco. She reports that she does not drink alcohol and does not use drugs.  Tobacco Cessation: Never smoker  Past surgical hx, Family hx, Social hx all  reviewed.  Current Outpatient Medications on File Prior to Visit  Medication Sig  . acetaminophen (TYLENOL) 500 MG tablet Take 1,000 mg by mouth 2 (two) times daily.  Marland Kitchen albuterol (VENTOLIN HFA) 108 (90 Base) MCG/ACT inhaler Inhale 2 puffs into the lungs every 6 (six) hours as needed for wheezing or shortness of breath.   . ASPERCREME LIDOCAINE EX Apply 1 application topically 2 (two) times daily as needed (knee pain).   . benzonatate (TESSALON) 100 MG capsule Take 1 capsule (  100 mg total) by mouth every 8 (eight) hours.  . budesonide (PULMICORT) 0.5 MG/2ML nebulizer solution Take 2 mLs (0.5 mg total) by nebulization in the morning and at bedtime.  . DULoxetine (CYMBALTA) 60 MG capsule Take 60 mg by mouth daily.  . formoterol (PERFOROMIST) 20 MCG/2ML nebulizer solution Take 2 mLs (20 mcg total) by nebulization 2 (two) times daily. J45. [address] Run Part B  . furosemide (LASIX) 40 MG tablet Take 40 mg by mouth daily as needed (for fluid retention.).   Marland Kitchen guaiFENesin (MUCINEX) 600 MG 12 hr tablet Take by mouth 2 (two) times daily as needed.  Marland Kitchen ipratropium (ATROVENT) 0.02 % nebulizer solution Take 0.5 mg by nebulization every 4 (four) hours as needed for wheezing or shortness of breath.  . lubiprostone (AMITIZA) 24 MCG capsule Take 24 mcg by mouth daily.  . Menthol, Topical Analgesic, 4 % GEL Apply 1 application topically 2 (two) times daily as needed (knee pain).   . metoprolol tartrate (LOPRESSOR) 50 MG tablet Take 50 mg by mouth 2 (two) times daily.  . montelukast (SINGULAIR) 10 MG tablet Take 10 mg by mouth daily.  . Multiple Vitamin (MULTIVITAMIN WITH MINERALS) TABS tablet Take 1 tablet by mouth daily.  . Olmesartan-amLODIPine-HCTZ 40-5-12.5 MG TABS Take 1 tablet by mouth daily at 12 noon.  . ondansetron (ZOFRAN) 4 MG tablet Take 1 tablet (4 mg total) by mouth every 8 (eight) hours as needed for nausea or vomiting.  . potassium chloride SA (K-DUR,KLOR-CON) 20 MEQ tablet Take 20 mEq by mouth daily as  needed (With lasix).   . pravastatin (PRAVACHOL) 20 MG tablet Take 20 mg by mouth daily.  . predniSONE (DELTASONE) 10 MG tablet Take 10 mg by mouth daily as needed (asthma).   Marland Kitchen PRESCRIPTION MEDICATION Inhale into the lungs at bedtime. CPAP  . Vitamin D, Ergocalciferol, (DRISDOL) 50000 UNITS CAPS Take 50,000 Units by mouth every Monday.   . metoprolol tartrate (LOPRESSOR) 25 MG tablet Take 2 tablets (50 mg total) by mouth 2 (two) times daily.   No current facility-administered medications on file prior to visit.     No Known Allergies  Review Of Systems:  Constitutional:   No  weight loss, night sweats,  Fevers, chills, fatigue, or  lassitude.  HEENT:   No headaches,  Difficulty swallowing,  Tooth/dental problems, or  Sore throat,                No sneezing, itching, ear ache, nasal congestion, post nasal drip,   CV:  No chest pain,  Orthopnea, PND, + swelling in lower extremities, No anasarca, dizziness, palpitations, syncope.   GI  No heartburn, indigestion, abdominal pain, nausea, vomiting, diarrhea, change in bowel habits, loss of appetite, bloody stools.   Resp: + shortness of breath with exertion or at rest.  + excess mucus, + productive cough,  No non-productive cough,  No coughing up of blood.  No change in color of mucus.  ++ wheezing.  No chest wall deformity  Skin: no rash or lesions.  GU: no dysuria, change in color of urine, no urgency or frequency.  No flank pain, no hematuria   MS:  No joint pain or swelling.  + decreased range of motion.  No back pain.  Psych:  No change in mood or affect. No depression or anxiety.  No memory loss.   Vital Signs BP 122/84 (BP Location: Left Arm, Cuff Size: Normal)   Pulse 83   Temp 98 F (36.7 C) (Oral)  Ht 5\' 1"  (1.549 m)   Wt 260 lb 1.6 oz (118 kg)   SpO2 99%   BMI 49.15 kg/m    Physical Exam:  General- No distress,  A&Ox3, pleasant deconditioned elderly female  ENT: No sinus tenderness, TM clear, pale nasal mucosa,  no oral exudate,no post nasal drip, no LAN Cardiac: S1, S2, regular rate and rhythm, no murmur Chest: + wheeze/ No rales/ dullness; no accessory muscle use, no nasal flaring, no sternal retractions, very diminished breath sounds Abd.: Soft Non-tender, ND, BS + , Body mass index is 49.15 kg/m. Ext: No clubbing cyanosis, + 1+ LE edema, brisk refill Neuro: deconditioned at baseline, MAE x 4, A&O x 3, HOH Skin: No rashes, O lesions, warm and dry Psych: normal mood and behavior   Assessment/Plan  Asthma Flare after coming off maintenance Trelegy x 1 week Awaiting maintenance  ICS/LABA nebs ordered 3/23, insurance delay, patient has been waiting over a week.  Plan We will restart Trelegy until you get your nebs Dr. Silas Flood ordered.  1 Puff once daily Use every day without fail Rinse mouth after use.  Continue your albuterol as rescue for breakthrough shortness of breath or wheezing.  Call if you do not get better in the next few days so we can add a predisone taper. As soon as you get the nebulizer medications , start using them and stop the Trelegy. Follow up with Dr. Silas Flood in 5 weeks based on his original consult date for follow up. Call if you need [place] sooner, or seek emergency care  Dyspnea with exertion Multifactorial with obesity, deconditioning , and uncontrolled underlying asthma Post Covid 07/2019 ( Received Monoclonal antibody) Dopplers were negative for VTE 10/24/2020 Plan Will need to be walked once flare has resolved to see if she qualifies for home oxygen.  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  I am concerned she may have worsening PAH/ heart failure which is contributing to her dyspnea and hypoxemia.   OSA / OSH on CPAP Managed  through neurology Plan Continue CPAP nightly without fail.   This appointment was over 60 minutes long with over 50% of the time being direct face to face patient care, assessment , plan of care and follow up,    Theresa Spatz, NP 10/29/2020  4:23 PM

## 2020-10-30 ENCOUNTER — Encounter: Payer: Self-pay | Admitting: Acute Care

## 2020-10-31 NOTE — Telephone Encounter (Signed)
PA for Pulmicort was denied according to [email or web address]. A fax was sent our office with the denial reasoning. I checked MH's folder as well as the PA folder and did not see anything for this patient.   Called [facility] and spoke with Bell Arthur. She stated that she would send the fax to our office. Will await fax since this will be needed for the denial.

## 2020-11-03 ENCOUNTER — Ambulatory Visit (HOSPITAL_COMMUNITY)
Admission: RE | Admit: 2020-11-03 | Discharge: 2020-11-03 | Disposition: A | Discharge status: Home / Self Care | Payer: Medicare Other | Source: Ambulatory Visit | Attending: Pulmonary Disease | Admitting: Pulmonary Disease

## 2020-11-03 ENCOUNTER — Other Ambulatory Visit: Payer: Self-pay

## 2020-11-03 DIAGNOSIS — K219 Gastro-esophageal reflux disease without esophagitis: Secondary | ICD-10-CM | POA: Insufficient documentation

## 2020-11-03 DIAGNOSIS — J4541 Moderate persistent asthma with (acute) exacerbation: Secondary | ICD-10-CM | POA: Insufficient documentation

## 2020-11-03 DIAGNOSIS — I10 Essential (primary) hypertension: Secondary | ICD-10-CM | POA: Diagnosis not present

## 2020-11-03 DIAGNOSIS — Z8616 Personal history of COVID-19: Secondary | ICD-10-CM | POA: Diagnosis not present

## 2020-11-03 DIAGNOSIS — E785 Hyperlipidemia, unspecified: Secondary | ICD-10-CM | POA: Diagnosis not present

## 2020-11-03 DIAGNOSIS — R0600 Dyspnea, unspecified: Secondary | ICD-10-CM

## 2020-11-03 DIAGNOSIS — R0609 Other forms of dyspnea: Secondary | ICD-10-CM

## 2020-11-03 LAB — ECHOCARDIOGRAM COMPLETE
Area-P 1/2: 3.31 cm2
S' Lateral: 2.7 cm

## 2020-11-03 NOTE — Progress Notes (Signed)
Echocardiogram 2D Echocardiogram with 3D and strain has been performed.  Darlina Sicilian M 11/03/2020, 9:46 AM

## 2020-11-06 NOTE — Telephone Encounter (Signed)
Called and spoke with [facility]. She verbalized understanding of results. Advised her that I will go instead and fill out the forms for [place]. She wishes to have her number listed as the contact number as she works at home.   Form has been signed by [facility]. Faxed to [employer]. Will hold onto fax for 2 weeks before sending to scan.

## 2020-11-06 NOTE — Telephone Encounter (Signed)
Will forward to triage to see if fax has come through.

## 2020-11-06 NOTE — Telephone Encounter (Signed)
Looked at [name] on CoverMyMeds since we never received a fax. It stated that the Perforomist and Pulmicort PAs were denied because they are not covered under Part D since the patient is not receiving the medication in a nursing facility. Part A and B will cover the medication. [facility] will not file for Parts A and B so the RX will need to be sent to a DME company.   I called and spoke with patient's daughter [name]. I explained the situation to her, she verbalized understanding. She stated that her mother is not established with a DME company from her knowledge. She is ok with [place] sending an order to a DME company.   While on the phone, she wanted to know if MH had looked at the echo results from Monday. Advised her I would send a message over to [facility] for her, she verbalized understanding.   MH, please advise if you are ok with [place] sending the Pulmicort and Perforomist to a DME or [facility] (I will fill out the referral form, this might be the easier option)? And please advise about her echo results. Thanks!

## 2020-11-06 NOTE — Telephone Encounter (Signed)
Yes please send to DME.  The echo looks good, very reassuring. Squeeze is good, valves look ok.

## 2020-11-12 DIAGNOSIS — C44329 Squamous cell carcinoma of skin of other parts of face: Secondary | ICD-10-CM | POA: Diagnosis not present

## 2020-11-12 DIAGNOSIS — L821 Other seborrheic keratosis: Secondary | ICD-10-CM | POA: Diagnosis not present

## 2020-11-12 DIAGNOSIS — D485 Neoplasm of uncertain behavior of skin: Secondary | ICD-10-CM | POA: Diagnosis not present

## 2020-11-12 DIAGNOSIS — L720 Epidermal cyst: Secondary | ICD-10-CM | POA: Diagnosis not present

## 2020-11-13 DIAGNOSIS — D649 Anemia, unspecified: Secondary | ICD-10-CM | POA: Diagnosis not present

## 2020-11-13 DIAGNOSIS — D509 Iron deficiency anemia, unspecified: Secondary | ICD-10-CM | POA: Diagnosis not present

## 2020-11-13 DIAGNOSIS — J45909 Unspecified asthma, uncomplicated: Secondary | ICD-10-CM | POA: Diagnosis not present

## 2020-11-13 DIAGNOSIS — C8330 Diffuse large B-cell lymphoma, unspecified site: Secondary | ICD-10-CM | POA: Diagnosis not present

## 2020-11-21 DIAGNOSIS — D508 Other iron deficiency anemias: Secondary | ICD-10-CM | POA: Diagnosis not present

## 2020-11-28 DIAGNOSIS — D509 Iron deficiency anemia, unspecified: Secondary | ICD-10-CM | POA: Diagnosis not present

## 2020-11-28 DIAGNOSIS — I129 Hypertensive chronic kidney disease with stage 1 through stage 4 chronic kidney disease, or unspecified chronic kidney disease: Secondary | ICD-10-CM | POA: Insufficient documentation

## 2020-11-28 DIAGNOSIS — Z8711 Personal history of peptic ulcer disease: Secondary | ICD-10-CM | POA: Diagnosis not present

## 2020-11-28 DIAGNOSIS — K5909 Other constipation: Secondary | ICD-10-CM | POA: Diagnosis not present

## 2020-11-28 DIAGNOSIS — Z8719 Personal history of other diseases of the digestive system: Secondary | ICD-10-CM | POA: Diagnosis not present

## 2020-11-28 DIAGNOSIS — K5904 Chronic idiopathic constipation: Secondary | ICD-10-CM | POA: Diagnosis not present

## 2020-11-28 DIAGNOSIS — Z79899 Other long term (current) drug therapy: Secondary | ICD-10-CM | POA: Diagnosis not present

## 2020-12-16 ENCOUNTER — Telehealth: Payer: Self-pay | Admitting: Pulmonary Disease

## 2020-12-16 NOTE — Telephone Encounter (Signed)
Called and spoke with pt who states she began to have symptoms two days ago including increased SOB, coughing, and wheezing.  Pt said her symptoms were worse overnight 5/17 to the point that she woke up having problems breathing and had to take a neb treatment.  Pt said within the last two days, she has had to do at least 3 neb treatments using her albuterol sol and has had to use her rescue inhaler 3 times yesterday 5/16 and once so far today 5/17.  Pt is using her scheduled budesonide and perforomist neb sol twice daily as scheduled. Pt also stated that she is taking mucinex twice daily.  Pt wants to know if there is anything that could be recommended to help with her symptoms.  Dr. Elsworth Soho who was provider of the day had already left so tried to call his cell and could not reach him. I left Dr. Elsworth Soho a detailed message for him to call my phone to further discuss recs.  Called pt back to see if she would be okay coming in tomorrow 5/18 at 3:30 for an acute visit and she said she would. Scheduled pt an acute visit with Dr. Elsworth Soho. Nothing further needed.

## 2020-12-17 ENCOUNTER — Ambulatory Visit: Payer: Medicare Other | Admitting: Pulmonary Disease

## 2020-12-17 ENCOUNTER — Encounter: Payer: Self-pay | Admitting: Pulmonary Disease

## 2020-12-17 MED ORDER — PREDNISONE 20 MG PO TABS: 40.0000 mg | ORAL_TABLET | Freq: Every day | ORAL | 0 refills | Status: DC

## 2020-12-17 NOTE — Addendum Note (Signed)
Addended by: Lorretta Harp on: 12/17/2020 09:45 AM   Modules accepted: Orders

## 2020-12-17 NOTE — Telephone Encounter (Signed)
Spoke with Dr. Silas Flood about pt to see if he would be okay with [place] sending meds in for pt instead of having her come in for an appt due to pt having a f/u scheduled with Dr. Silas Flood 5/23.   Message from Dr. Silas Flood: can try pred 40 mg daily x 5 days - if not better next 24 hours be seen in office vs urgent care/ED    Called and spoke with pt letting her know this and she verbalized understanding. Stated to her that I was going to cancel her acute visit that I had originally scheduled for this afternoon and send meds in for her. Stated to her to f/u with Dr. Silas Flood as scheduled and she verbalized  understanding. meds have been sent in to preferred pharmacy. Nothing further needed.

## 2020-12-22 ENCOUNTER — Other Ambulatory Visit: Payer: Self-pay

## 2020-12-22 ENCOUNTER — Ambulatory Visit (INDEPENDENT_AMBULATORY_CARE_PROVIDER_SITE_OTHER): Payer: Medicare Other | Admitting: Pulmonary Disease

## 2020-12-22 ENCOUNTER — Encounter: Payer: Self-pay | Admitting: Pulmonary Disease

## 2020-12-22 VITALS — BP 132/76 | HR 71 | Temp 98.0°F

## 2020-12-22 DIAGNOSIS — J4540 Moderate persistent asthma, uncomplicated: Secondary | ICD-10-CM

## 2020-12-22 MED ORDER — PREDNISONE 10 MG PO TABS
ORAL_TABLET | ORAL | 0 refills | Status: AC
Start: 2020-12-22 — End: 2021-01-01

## 2020-12-22 NOTE — Patient Instructions (Signed)
Nice to see you again  I am sorry not feeling well, I am glad the prednisone has helped some.  I sent additional prednisone to the pharmacy-20 mg for 5 days then 10 mg for 5 days then stop  We will fill out paperwork today for a medication called Nucala.  This is to treat eosinophils which can cause difficult to treat asthma symptoms.  You are required to have an EpiPen with this.  We will prescribe this once the Nucala is approved.  If at any point in time you wish to stop the process for no longer want to use Nucala, that is totally fine.  Please let me know.  Follow-up with Dr. Silas Flood in 3 months or sooner as needed

## 2020-12-23 ENCOUNTER — Encounter: Payer: Self-pay | Admitting: Pulmonary Disease

## 2020-12-23 NOTE — Progress Notes (Signed)
@Patient  ID: [name], female    DOB: 02-04-1941, 80 y.o.   MRN: [id number]  Chief Complaint  Patient presents with  . Follow-up    Finished prednisone, not wheezing but still coughing up yellow mucus    Referring provider: Leanna Battles, MD  HPI:   80 year old whom we are seeing for evaluation of dyspnea on exertion, cough.  Note from Eric Form NP 09/2020 reviewed. Recent telephone encounter reviewed.  Patient comes to clinic today.  Called last week with increased wheezing, shortness of breath.  Was placed on prednisone.  Has improved somewhat.  Still with wheezing.  Does not exertion not at baseline.  Finished last dose of 40 mg prednisone daily yesterday.  Using nebulized ICS/LABA as prescribed last time seeing me.  Notes that breathing has been a bit worse, worsening symptoms with the pollen.  Avoids going outdoors.  HPI at initial visit:  Patient contracted Covid 07/2019.  She does stay at home.  She got monoclonal antibody infusion.  Since then, she has had about every month exacerbation of her underlying with presumably asthma.  Wheeze, worsening dyspnea exertion, cough.  She has gotten a course of prednisone and antibiotics.  Been told she has pneumonia.  Presently based on chest x-rays that I cannot observe or view.    Cough is productive.  Green to yellowish phlegm.  Seems may be worse in the mornings and evenings.  No environmental or seasonal changes.  No other alleviating or exacerbating factors.  Dyspnea on exertion obviously worse when she exerts her self,, better with rest.  Has developed shortness of breath at rest as well.  No timing.  They were things are better or worse.  No alleviating or exacerbating factors.  She has been using Trelegy and does not think it helps.  Most recent chest x-ray 07/2019 reviewed which reveals clear lungs, streaky opacity left base, heart size appears enlarged on my interpretation.   PMH: Asthma, sleep apnea, back pain Surgical  history: C-section, knee replacement, hysterectomy, Family history: Mother with breast cancer, father with CAD, CVA   Questionaires / Pulmonary Flowsheets:   ACT:  Asthma Control Test ACT Total Score  12/22/2020 10  10/22/2020 7    MMRC: No flowsheet data found.  Epworth:  No flowsheet data found.  Tests:   FENO:  No results found for: NITRICOXIDE  PFT: No flowsheet data found.  WALK:  No flowsheet data found.  Imaging: Personally reviewed and as per EMR and discussion of this note No results found.  Lab Results: Personally reviewed CBC    Component Value Date/Time   WBC 6.9 07/20/2019 1621   RBC 4.16 07/20/2019 1621   HGB 10.1 (L) 07/20/2019 1621   HGB 11.2 (L) 11/18/2008 1132   HCT 34.8 (L) 07/20/2019 1621   HCT 33.6 (L) 11/18/2008 1132   PLT 354 07/20/2019 1621   PLT 235 11/18/2008 1132   MCV 83.7 07/20/2019 1621   MCV 91.2 11/18/2008 1132   MCH 24.3 (L) 07/20/2019 1621   MCHC 29.0 (L) 07/20/2019 1621   RDW 16.4 (H) 07/20/2019 1621   RDW 14.7 (H) 11/18/2008 1132   LYMPHSABS 1.5 07/20/2019 1621   LYMPHSABS 0.5 (L) 11/18/2008 1132   MONOABS 1.1 (H) 07/20/2019 1621   MONOABS 0.0 (L) 11/18/2008 1132   EOSABS 0.1 07/20/2019 1621   EOSABS 0.5 11/18/2008 1132   BASOSABS 0.1 07/20/2019 1621   BASOSABS 0.0 11/18/2008 1132    BMET    Component Value Date/Time  NA 136 07/20/2019 1621   K 3.7 07/20/2019 1621   CL 98 07/20/2019 1621   CO2 30 07/20/2019 1621   GLUCOSE 108 (H) 07/20/2019 1621   BUN 15 07/20/2019 1621   CREATININE 0.93 07/20/2019 1621   CALCIUM 8.8 (L) 07/20/2019 1621   GFRNONAA 58 (L) 07/20/2019 1621   GFRAA >60 07/20/2019 1621    BNP    Component Value Date/Time   BNP 74.2 01/31/2017 1655    ProBNP    Component Value Date/Time   PROBNP 240.0 (H) 06/18/2008 1657    Specialty Problems      Pulmonary Problems   Complex sleep apnea syndrome   Sleeps in sitting position due to orthopnea   Moderate persistent asthma       No Known Allergies  Immunization History  Administered Date(s) Administered  . Influenza Split 05/13/2011, 05/25/2012  . Influenza,inj,quad, With Preservative 06/14/2013  . Influenza-Unspecified 05/02/2017  . Pneumococcal Polysaccharide-23 07/20/2011    Past Medical History:  Diagnosis Date  . Anxiety   . Arthritis   . Asthma    hx of  . Back pain   . Chronic constipation   . Depression    hx of  . Dyspnea   . Family history of adverse reaction to anesthesia    DAD bp ELEVATED   . GERD (gastroesophageal reflux disease)   . High cholesterol   . Hypertension   . Lymphoma ([place])   . Obesity   . Sleep apnea    uses a CPAP  . Stroke ([place])    2010  . SVT (supraventricular tachycardia) ([facility])     Tobacco History: Social History   Tobacco Use  Smoking Status Never Smoker  Smokeless Tobacco Never Used   Counseling given: Not Answered   Continue to not smoke  Outpatient Encounter Medications as of 12/22/2020  Medication Sig  . acetaminophen (TYLENOL) 500 MG tablet Take 1,000 mg by mouth 2 (two) times daily.  Marland Kitchen albuterol (VENTOLIN HFA) 108 (90 Base) MCG/ACT inhaler Inhale 2 puffs into the lungs every 6 (six) hours as needed for wheezing or shortness of breath.   . ASPERCREME LIDOCAINE EX Apply 1 application topically 2 (two) times daily as needed (knee pain).   . benzonatate (TESSALON) 100 MG capsule Take 1 capsule (100 mg total) by mouth every 8 (eight) hours.  . budesonide (PULMICORT) 0.5 MG/2ML nebulizer solution Take 2 mLs (0.5 mg total) by nebulization in the morning and at bedtime.  . DULoxetine (CYMBALTA) 60 MG capsule Take 60 mg by mouth daily.  . formoterol (PERFOROMIST) 20 MCG/2ML nebulizer solution Take 2 mLs (20 mcg total) by nebulization 2 (two) times daily. J45. [address] Part B  . furosemide (LASIX) 40 MG tablet Take 40 mg by mouth daily as needed (for fluid retention.).   Marland Kitchen guaiFENesin (MUCINEX) 600 MG 12 hr tablet Take by mouth 2 (two) times daily as  needed.  Marland Kitchen ipratropium (ATROVENT) 0.02 % nebulizer solution Take 0.5 mg by nebulization every 4 (four) hours as needed for wheezing or shortness of breath.  . lubiprostone (AMITIZA) 24 MCG capsule Take 24 mcg by mouth daily.  . Menthol, Topical Analgesic, 4 % GEL Apply 1 application topically 2 (two) times daily as needed (knee pain).   . metoprolol tartrate (LOPRESSOR) 50 MG tablet Take 50 mg by mouth 2 (two) times daily.  . montelukast (SINGULAIR) 10 MG tablet Take 10 mg by mouth daily.  . Multiple Vitamin (MULTIVITAMIN WITH MINERALS) TABS tablet Take 1 tablet  by mouth daily.  . Olmesartan-amLODIPine-HCTZ 40-5-12.5 MG TABS Take 1 tablet by mouth daily at 12 noon.  . ondansetron (ZOFRAN) 4 MG tablet Take 1 tablet (4 mg total) by mouth every 8 (eight) hours as needed for nausea or vomiting.  . potassium chloride SA (K-DUR,KLOR-CON) 20 MEQ tablet Take 20 mEq by mouth daily as needed (With lasix).   . pravastatin (PRAVACHOL) 20 MG tablet Take 20 mg by mouth daily.  . predniSONE (DELTASONE) 10 MG tablet Take 2 tablets (20 mg total) by mouth daily with breakfast for 5 days, THEN 1 tablet (10 mg total) daily with breakfast for 5 days.  Marland Kitchen PRESCRIPTION MEDICATION Inhale into the lungs at bedtime. CPAP  . Vitamin D, Ergocalciferol, (DRISDOL) 50000 UNITS CAPS Take 50,000 Units by mouth every Monday.   . metoprolol tartrate (LOPRESSOR) 25 MG tablet Take 2 tablets (50 mg total) by mouth 2 (two) times daily.  . SYMBICORT 160-4.5 MCG/ACT inhaler Inhale 2 puffs into the lungs 2 (two) times daily.  . [DISCONTINUED] predniSONE (DELTASONE) 10 MG tablet Take 10 mg by mouth daily as needed (asthma).  (Patient not taking: Reported on 12/22/2020)  . [DISCONTINUED] predniSONE (DELTASONE) 20 MG tablet Take 2 tablets (40 mg total) by mouth daily with breakfast. (Patient not taking: Reported on 12/22/2020)   No facility-administered encounter medications on file as of 12/22/2020.     Review of Systems  Review of  Systems  n/a Physical Exam  BP 132/76 (BP Location: Left Arm, Cuff Size: Normal)   Pulse 71   Temp 98 F (36.7 C)   SpO2 97%   Wt Readings from Last 5 Encounters:  10/29/20 260 lb 1.6 oz (118 kg)  10/22/20 264 lb 9.6 oz (120 kg)  05/22/19 267 lb (121.1 kg)  05/16/19 278 lb 12.8 oz (126.5 kg)  04/03/19 290 lb 2 oz (131.6 kg)    BMI Readings from Last 5 Encounters:  10/29/20 49.15 kg/m  10/22/20 50.00 kg/m  05/22/19 50.45 kg/m  05/16/19 54.45 kg/m  04/03/19 54.82 kg/m     Physical Exam General: Well-appearing, no acute distress Eyes: EOMI, no icterus Neck: Supple, no JVP Pulmonary: Diffuse end expiratory wheeze bilaterally, normal work of breathing Cardiovascular: Regular rate and rhythm, no murmurs Neuro: Seated in wheelchair, no focal weakness, sensation intact   Assessment & Plan:   Dyspnea exertion: Likely multifactorial related to deconditioning, obesity, concern for uncontrolled asthma given multiple prednisone courses with mild improvement each time, possible other cardiac etiologies.  Echocardiogram ordered.  Please see below for asthma.  Asthma: Worsening dyspnea on exertion, shortness of breath, cough.  Recent exacerbation finished prednisone yesterday. Still with wheeze. Prolong steroid taper 20 mg x 5 days then 10 mg x 5 days. Using neb ICS/LABA still with exacerbation (transitioned from DPI). Eos elevated in past. Start paperwork for [employer].    Return in about 3 months (around 03/24/2021).   Lanier Clam, MD 12/23/2020

## 2021-01-09 ENCOUNTER — Other Ambulatory Visit (HOSPITAL_COMMUNITY): Payer: Self-pay

## 2021-01-09 ENCOUNTER — Telehealth: Payer: Self-pay | Admitting: Pharmacist

## 2021-01-09 NOTE — Telephone Encounter (Addendum)
Please start Nucala BIV.  Dose: 100mg  every 28 days  Dx: J45.50 (moderate persistent asthma) Absolute eosinophils on 11/28/20 and 11/13/20 was 700  Current regimen: formoterol nebs, budesonide nebs, montelukast 10mg  daily  Naive to asthma biologics  [facility] [place] paperwork received but need provider signature. Placed in Dr. Kavin Leech mailbox. Patient portion placed in pending PAP info folder.  Knox Saliva, PharmD, MPH Clinical Pharmacist (Rheumatology and Pulmonology)

## 2021-01-09 NOTE — Telephone Encounter (Signed)
Received notification from [place] regarding a prior authorization for [place]. Authorization has been APPROVED from 12/10/2020 to 01/09/2022 .   Key: [place] # [id number]  Test claim revealed that insurance covers ([phone], leaving patient with a copay of $1,734.16.  New start paperwork in progress, awaiting provider signature.

## 2021-01-13 ENCOUNTER — Ambulatory Visit: Payer: Medicare Other | Admitting: Family Medicine

## 2021-01-14 NOTE — Telephone Encounter (Signed)
Submitted Patient Assistance Application to [facility] [place] for [employer] . Will update patient when we receive a response.  Fax# [phone] Phone# [phone]

## 2021-01-15 NOTE — Telephone Encounter (Signed)
Received fax from [place] to [place] stating that pt has been APPROVED for PAP from now until 08/01/2021.   Patient must fill through [place]: [phone]  Authorization # [id number] Phone # [phone]

## 2021-01-16 ENCOUNTER — Other Ambulatory Visit (HOSPITAL_COMMUNITY): Payer: Self-pay

## 2021-01-16 NOTE — Telephone Encounter (Signed)
Spoke with patient's daughter regarding Nucala approval. She states that patient would likely feel more comfortable with Epipen. Per test claim, generic Epipen is >$200. Discussed patient assistance program for Epipen through [employer]. She would like to pursue this to make her mother feel comfortable with starting Nucala. Emailed application via [employer] to [employer] confirmed with read back ([email or web address])  I did review that the rate of anaphylaxis is very low for Nucala. Reviewed that our office policy for Epipen requirement was in place for Xolair which works differently than Nucala. We will start Epipen pt assistance application. Provider portion of [employer] Epipen application will be placed in Dr. [employer].  Knox Saliva, PharmD, MPH Clinical Pharmacist (Rheumatology and Pulmonology)

## 2021-01-26 DIAGNOSIS — H40023 Open angle with borderline findings, high risk, bilateral: Secondary | ICD-10-CM | POA: Diagnosis not present

## 2021-01-26 DIAGNOSIS — H35371 Puckering of macula, right eye: Secondary | ICD-10-CM | POA: Diagnosis not present

## 2021-01-26 DIAGNOSIS — H35033 Hypertensive retinopathy, bilateral: Secondary | ICD-10-CM | POA: Diagnosis not present

## 2021-01-26 DIAGNOSIS — D492 Neoplasm of unspecified behavior of bone, soft tissue, and skin: Secondary | ICD-10-CM | POA: Diagnosis not present

## 2021-01-27 NOTE — Telephone Encounter (Signed)
Spoke with Darnelle Bos, patient's daughter, who states that she needed information from her mother who was out of town. She ust received this information last evening and will plan to submit [employer] form for Epipen patient assistance through [employer] today or tomorrow.  Knox Saliva, PharmD, MPH Clinical Pharmacist (Rheumatology and Pulmonology)

## 2021-02-03 NOTE — Telephone Encounter (Signed)
Submitted Patient Assistance Application to  [employer]  for  [facility]  along with provider portion. Will update patient when we receive a response.  Fax# [phone] Phone# [phone]

## 2021-02-03 NOTE — Telephone Encounter (Signed)
Patient portion signed and returned to pharmacy. Awaiting provider to return for signature.

## 2021-02-04 ENCOUNTER — Other Ambulatory Visit (HOSPITAL_COMMUNITY): Payer: Self-pay

## 2021-02-04 MED ORDER — EPINEPHRINE 0.3 MG/0.3ML IJ SOAJ: 0.3000 mg | INTRAMUSCULAR | 2 refills | Status: DC | PRN

## 2021-02-04 NOTE — Telephone Encounter (Signed)
Patient has been denied PAP through [employer]. EpiPen costs $163.18 through pt's insurance. Was able to obtain GoodRx discount cards [facility] [employer], and $112.75 at [facility].

## 2021-02-04 NOTE — Telephone Encounter (Signed)
[facility] for [facility]. Daphne, patient's daughter, advised about cost through [employer]. Emailed [facility] for [facility] to daughter's email  Patient is scheduled for Nucala new start on 02/05/21 @3 :20pm. Daughter aware that she will need Epipen on hand  Knox Saliva, PharmD, MPH Clinical Pharmacist (Rheumatology and Pulmonology)

## 2021-02-05 ENCOUNTER — Ambulatory Visit: Payer: Medicare Other | Admitting: Pharmacist

## 2021-02-05 ENCOUNTER — Other Ambulatory Visit: Payer: Self-pay

## 2021-02-05 DIAGNOSIS — J4541 Moderate persistent asthma with (acute) exacerbation: Secondary | ICD-10-CM

## 2021-02-05 MED ORDER — NUCALA 100 MG/ML ~~LOC~~ SOAJ: 100.0000 mg | SUBCUTANEOUS | 5 refills | Status: DC

## 2021-02-05 NOTE — Patient Instructions (Signed)
Your next Nucala dose is due on 03/05/21, 04/02/21, and every 4 weeks thereafter.  Your prescription will be shipped from [facility]. Their phone number is [phone] Please call to schedule shipment and confirm address. They will mail medication to your home.  Remember the 5 C's: COUNTER - leave on the counter at least 30 minutes but up to overnight to bring medication to room temperature. This may help prevent stinging COLD - place something cold (like an ice gel pack or cold water bottle) on the injection site just before cleansing with alcohol. This may help reduce pain CLARITIN - use Claritin (generic name is loratadine) for the first two weeks of treatment or the day of, the day before, and the day after injecting. This will help to minimize injection site reactions CORTISONE CREAM - apply if injection site is irritated and itching CALL ME - if injection site reaction is bigger than the size of your fist, looks infected, blisters, or if you develop hives  Mepolizumab injection What is this medication? MEPOLIZUMAB (me poe LIZ ue mab) is a monoclonal antibody. It treats severe asthma. It is used with other drugs for asthma. It also treats other conditions that have a high level of eosinophils (a type of white blood cell) called hypereosinophilic syndrome (HES) or Churg-Strauss Syndrome. This medicine isalso used to treat adults with sinus inflammation with nasal polyps. This medicine may be used for other purposes; ask your health care provider orpharmacist if you have questions. COMMON BRAND NAME(S): Nucala What should I tell my care team before I take this medication? They need to know if you have any of these conditions: parasitic (helminth) infection an unusual or allergic reaction to mepolizumab, hamster proteins, other medicines, foods, dyes, or preservatives pregnant or trying to get pregnant breast-feeding How should I use this medication? This drug is injected  under the skin. It is usually given by a health careprovider in a hospital or clinic setting. If you get this drug at home, you will be taught how to prepare and give it. Use it as directed on the prescription label. Keep taking it unless your healthcare provider tells you to stop. If you use a pen, be sure to take off the outer needle cover before using the dose. It is important that you put your used needles and syringes in a special sharps container. Do not put them in a trash can. If you do not have a sharpscontainer, call your pharmacist or healthcare provider to get one. This drug comes with INSTRUCTIONS FOR USE. Ask your pharmacist for directions on how to use this drug. Read the information carefully. Talk to yourpharmacist or health care provider if you have questions. Talk to your health care provider about the use of this drug in children. While it may be prescribed for children as young as 6 years for selected conditions,precautions do apply. Overdosage: If you think you have taken too much of this medicine contact apoison control center or emergency room at once. NOTE: This medicine is only for you. Do not share this medicine with others. What if I miss a dose? If you get this drug at the hospital or clinic: It is important not to miss your dose. Call your doctor or health care provider if you are unable to [facility] appointment. If you give yourself this drug at home: If you miss a dose, take it as soon as you can. Then continue your normal schedule. If it is almost time for your  next dose, take only that dose. Do not take double or extra doses. Call your healthcare provider with questions. What may interact with this medication? Interactions are not expected. This list may not describe all possible interactions. Give your health care provider a list of all the medicines, herbs, non-prescription drugs, or dietary supplements you use. Also tell them if you smoke, drink alcohol, or use  illegaldrugs. Some items may interact with your medicine. What should I watch for while using this medication? Tell your doctor or healthcare professional if your symptoms do not start toget better or if they get worse. Talk with your doctor if you have not had chickenpox or the vaccine forchickenpox. Do not stop taking your other asthma medicines unless instructed to do so byyour doctor or health care professional. Your condition will be monitored carefully while you are receiving thismedicine. What side effects may I notice from receiving this medication? Side effects that you should report to your doctor or health care professionalas soon as possible: allergic reactions like skin rash, itching or hives, swelling of the face, lips, or tongue breathing problems painful rash or blisters signs and symptoms of low blood pressure like dizziness; feeling faint or lightheaded, falls signs and symptoms of infection like fever or chills; cough; sore throat; pain or trouble passing urine Side effects that usually do not require medical attention (report to yourdoctor or health care professional if they continue or are bothersome): back pain headache pain, redness, or irritation at the site where injected weak or tired This list may not describe all possible side effects. Call your doctor for medical advice about side effects. You may report side effects to FDA at1-800-FDA-1088. Where should I keep my medication? Keep out of the reach of children and pets. Store in a refrigerator or at room temperature between 15 and 30 degrees C (59and 86 degrees F). Refrigeration (preferred): Store it in the refrigerator. Keep it in the original carton until you are ready to take it. Remove the dose from the carton about 30 minutes before it is time for you to take it. Use it within 8 hours of removing it from the carton. If the dose is out of the carton for more than 8hours, throw it away. Throw away any unused drug  after the expiration date. Room Temperature: This drug may be stored at room temperature for up to 7 days. Keep it in the original carton until you are ready to take it. Once removed from the carton, it must be used within 8 hours. If it is out of the carton for more than 8 hours, throw it away. If it is stored at room temperature, throwaway any unused drug after 7 days or after it expires, whichever is first. NOTE: This sheet is a summary. It may not cover all possible information. If you have questions about this medicine, talk to your doctor, pharmacist, orhealth care provider.  2022 Elsevier/Gold Standard (2020-02-29 16:38:25)

## 2021-02-05 NOTE — Progress Notes (Signed)
HPI [name] presents today to [facility] with her daughter, [name], to see pharmacy team for [facility] new start.  Past medical history includes eosinophilic, moderate persistent asthma, HTN, history of stroke, CHF, history of gastric ulcer, osteoporosis. She also has history of lymphoma which is in remission. AT her last OV with Dr. Silas Flood on 12/22/20, she was hesitant to start Nucala due to discussion about anaphylaxis. She has required recurrent prednisone tapers for her asthma. She also has elevated eosinophils.  Therefore she wanted an Epipen on hand in case of anaphylactic reaction - she herself does not have a history but does have family members who've had.  Epipen on hand and in date: Yes   Respiratory Medications Current regimen: budesonide nebs twice daily, formoterol nebs twice daily, montelukast 10mg  nightly Tried in past: Symbicort Patient reports no known adherence challenges  OBJECTIVE No Known Allergies  Outpatient Encounter Medications as of 02/05/2021  Medication Sig   acetaminophen (TYLENOL) 500 MG tablet Take 1,000 mg by mouth 2 (two) times daily.   albuterol (VENTOLIN HFA) 108 (90 Base) MCG/ACT inhaler Inhale 2 puffs into the lungs every 6 (six) hours as needed for wheezing or shortness of breath.    amLODipine (NORVASC) 5 MG tablet Take 5 mg by mouth daily.   ASPERCREME LIDOCAINE EX Apply 1 application topically 2 (two) times daily as needed (knee pain).    benzonatate (TESSALON) 100 MG capsule Take 1 capsule (100 mg total) by mouth every 8 (eight) hours.   budesonide (PULMICORT) 0.5 MG/2ML nebulizer solution Take 2 mLs (0.5 mg total) by nebulization in the morning and at bedtime.   DULoxetine (CYMBALTA) 60 MG capsule Take 60 mg by mouth daily.   EPINEPHrine (EPIPEN 2-PAK) 0.3 mg/0.3 mL IJ SOAJ injection Inject 0.3 mg into the muscle as needed for anaphylaxis. Call 9-1-1 after use   formoterol (PERFOROMIST) 20 MCG/2ML nebulizer solution Take 2 mLs (20 mcg  total) by nebulization 2 (two) times daily. J45. 909 Run Part B   furosemide (LASIX) 40 MG tablet Take 40 mg by mouth daily as needed (for fluid retention.).    guaiFENesin (MUCINEX) 600 MG 12 hr tablet Take by mouth 2 (two) times daily as needed.   ipratropium (ATROVENT) 0.02 % nebulizer solution Take 0.5 mg by nebulization every 4 (four) hours as needed for wheezing or shortness of breath.   Menthol, Topical Analgesic, 4 % GEL Apply 1 application topically 2 (two) times daily as needed (knee pain).    metoprolol tartrate (LOPRESSOR) 50 MG tablet Take 50 mg by mouth 2 (two) times daily.   montelukast (SINGULAIR) 10 MG tablet Take 10 mg by mouth daily.   Multiple Vitamin (MULTIVITAMIN WITH MINERALS) TABS tablet Take 1 tablet by mouth daily.   olmesartan-hydrochlorothiazide (BENICAR HCT) 40-12.5 MG tablet Take 1 tablet by mouth daily.   ondansetron (ZOFRAN) 4 MG tablet Take 1 tablet (4 mg total) by mouth every 8 (eight) hours as needed for nausea or vomiting.   potassium chloride SA (K-DUR,KLOR-CON) 20 MEQ tablet Take 20 mEq by mouth daily as needed (With lasix).    pravastatin (PRAVACHOL) 20 MG tablet Take 20 mg by mouth daily.   PRESCRIPTION MEDICATION Inhale into the lungs at bedtime. CPAP   Vitamin D, Ergocalciferol, (DRISDOL) 50000 UNITS CAPS Take 50,000 Units by mouth every Monday.    [DISCONTINUED] lubiprostone (AMITIZA) 24 MCG capsule Take 24 mcg by mouth daily. (Patient not taking: Reported on 02/05/2021)   [DISCONTINUED] metoprolol tartrate (LOPRESSOR) 25 MG  tablet Take 2 tablets (50 mg total) by mouth 2 (two) times daily.   [DISCONTINUED] Olmesartan-amLODIPine-HCTZ 40-5-12.5 MG TABS Take 1 tablet by mouth daily at 12 noon.   [DISCONTINUED] SYMBICORT 160-4.5 MCG/ACT inhaler Inhale 2 puffs into the lungs 2 (two) times daily.   No facility-administered encounter medications on file as of 02/05/2021.     Immunization History  Administered Date(s) Administered   Influenza Split 05/13/2011,  05/25/2012   Influenza,inj,quad, With Preservative 06/14/2013   Influenza-Unspecified 05/02/2017   Pneumococcal Polysaccharide-23 07/20/2011     PFTs No flowsheet data found.   Eosinophils Most recent blood eosinophil count was 700 cells/microL taken on 11/29/98.   IgE: none on file  Assessment   Biologics training for mepolizumab (Nucala)  Goals of therapy: Mechanism of Action: Not fully understood. It does act an interleukin-5 (IL-5) antagonist monoclonal antibody that reduces the production and survival of eosinophils by blocking the binding of IL-5 to the alpha chain of the receptor complex on the eosinophil cell surface. Reviewed that Nucala is add-on medication and patient must continue maintenance inhaler regimen. Response to therapy: may take 3 months to 6 months to determine efficacy. Discussed that patients generally feel improvement sooner than 3 months.  Side effects: headache (19%), injection site reaction (7-15%), antibody development (6%), backache (5%), fatigue (5%)  Dose: 100 mg subcutaneously every 4 weeks  Administration/Storage:  Reviewed administration sites of thigh or abdomen (at least 2-3 inches away from abdomen). Reviewed the upper arm is only appropriate if caregiver is administering injection  Do not shake the reconstituted solution as this could lead to product foaming or precipitation. Solution should be clear to opalescent and colorless to pale yellow or pale brown, essentially particle free. Small air bubbles, however, are expected and acceptable. If particulate matter remains in the solution or if the solution appears cloudy or milky, discard the solution.  Reviewed storage of medication in refrigerator. Reviewed that Nucala can be stored at room temperature in unopened carton for up to 7 days.  Access: Approval of Nucala through: patient assistance ([facility] [place] through 08/01/21)  After review of all of the above, we discussed that patient can  still continue thinking about Nucala if she did not want to start Nucala in office today. I printed patient education material to give to patient today to read and answered all questions specifically about anaphylaxis, herpes zoster, parasitic infections.   She ultimately chose to start it today.  Patient's daughter, [name], administered Nucala in the back of right upper arm using Nucala auto-injector 100mg /mL pen NDC: [phone] Lot:  [id number] Exp: 04/2023  Medication Reconciliation  A drug regimen assessment was performed, including review of allergies, interactions, disease-state management, dosing and immunization history. Medications were reviewed with the patient, including name, instructions, indication, goals of therapy, potential side effects, importance of adherence, and safe use.  Drug interaction(s): none noted  Immunizations  Patient is indicated for the influenzae, pneumonia, and shingles vaccinations. Patient has received no COVID19 vaccines or zoster vaccine and is not interested at this time. Reviewed risk of herpes zoster reactivation with biologic medications and importance of zoster vaccine.  PLAN Continue Nucala 100mg  every 4 weeks. Next dose is due 03/05/21 and every 4 weeks thereafter. Rx sent to:  [facility]  with patient assistance application.  Pharmacy phone number [phone] provided to patient to schedule shipment. Patient provided with pharmacy phone number and advised to call later this week to schedule shipment to home. Patient provided with copay card  information to provide to pharmacy if quoted copay exceeds $5 per month. Continue maintenance asthma regimen of: budesonide nebs twice daily, formoterol nebs twice daily, montelukast 10mg  nightly  All questions encouraged and answered.  Instructed patient to reach out with any further questions or concerns.  Thank you for allowing pharmacy to participate in this patient's care.  This appointment  required 90 minutes of patient care (this includes precharting, chart review, review of results, face-to-face care, etc.).  Knox Saliva, PharmD, MPH Clinical Pharmacist (Rheumatology and Pulmonology)

## 2021-02-10 ENCOUNTER — Telehealth: Payer: Self-pay | Admitting: Pulmonary Disease

## 2021-02-10 NOTE — Telephone Encounter (Signed)
Called [phone], which is the number to [facility] and spoke with [facility].  Brayton Layman stated they had no message or info on Patient in [facility] system.   Will close message at this time.

## 2021-02-26 ENCOUNTER — Ambulatory Visit: Payer: Medicare Other | Admitting: Family Medicine

## 2021-02-26 DIAGNOSIS — M545 Low back pain, unspecified: Secondary | ICD-10-CM | POA: Diagnosis not present

## 2021-02-26 DIAGNOSIS — J449 Chronic obstructive pulmonary disease, unspecified: Secondary | ICD-10-CM | POA: Diagnosis not present

## 2021-02-26 DIAGNOSIS — D508 Other iron deficiency anemias: Secondary | ICD-10-CM | POA: Diagnosis not present

## 2021-02-26 DIAGNOSIS — C8510 Unspecified B-cell lymphoma, unspecified site: Secondary | ICD-10-CM | POA: Diagnosis not present

## 2021-02-26 DIAGNOSIS — Z79899 Other long term (current) drug therapy: Secondary | ICD-10-CM | POA: Diagnosis not present

## 2021-02-26 DIAGNOSIS — I10 Essential (primary) hypertension: Secondary | ICD-10-CM | POA: Diagnosis not present

## 2021-02-26 DIAGNOSIS — R609 Edema, unspecified: Secondary | ICD-10-CM | POA: Diagnosis not present

## 2021-02-26 DIAGNOSIS — I509 Heart failure, unspecified: Secondary | ICD-10-CM | POA: Diagnosis not present

## 2021-02-26 DIAGNOSIS — K5900 Constipation, unspecified: Secondary | ICD-10-CM | POA: Diagnosis not present

## 2021-02-26 DIAGNOSIS — C8338 Diffuse large B-cell lymphoma, lymph nodes of multiple sites: Secondary | ICD-10-CM | POA: Diagnosis not present

## 2021-02-26 DIAGNOSIS — Z7982 Long term (current) use of aspirin: Secondary | ICD-10-CM | POA: Diagnosis not present

## 2021-02-26 DIAGNOSIS — E785 Hyperlipidemia, unspecified: Secondary | ICD-10-CM | POA: Diagnosis not present

## 2021-02-26 DIAGNOSIS — I110 Hypertensive heart disease with heart failure: Secondary | ICD-10-CM | POA: Diagnosis not present

## 2021-02-26 DIAGNOSIS — G8929 Other chronic pain: Secondary | ICD-10-CM | POA: Diagnosis not present

## 2021-03-16 DIAGNOSIS — E785 Hyperlipidemia, unspecified: Secondary | ICD-10-CM | POA: Diagnosis not present

## 2021-03-16 DIAGNOSIS — M810 Age-related osteoporosis without current pathological fracture: Secondary | ICD-10-CM | POA: Diagnosis not present

## 2021-03-16 DIAGNOSIS — I10 Essential (primary) hypertension: Secondary | ICD-10-CM | POA: Diagnosis not present

## 2021-03-16 DIAGNOSIS — R7301 Impaired fasting glucose: Secondary | ICD-10-CM | POA: Diagnosis not present

## 2021-03-23 DIAGNOSIS — I10 Essential (primary) hypertension: Secondary | ICD-10-CM | POA: Diagnosis not present

## 2021-03-23 DIAGNOSIS — R0600 Dyspnea, unspecified: Secondary | ICD-10-CM | POA: Diagnosis not present

## 2021-03-23 DIAGNOSIS — I700 Atherosclerosis of aorta: Secondary | ICD-10-CM | POA: Diagnosis not present

## 2021-03-23 DIAGNOSIS — M25562 Pain in left knee: Secondary | ICD-10-CM | POA: Diagnosis not present

## 2021-03-23 DIAGNOSIS — G4733 Obstructive sleep apnea (adult) (pediatric): Secondary | ICD-10-CM | POA: Diagnosis not present

## 2021-03-23 DIAGNOSIS — E785 Hyperlipidemia, unspecified: Secondary | ICD-10-CM | POA: Diagnosis not present

## 2021-03-23 DIAGNOSIS — Z0000 Encounter for general adult medical examination without abnormal findings: Secondary | ICD-10-CM | POA: Diagnosis not present

## 2021-03-23 DIAGNOSIS — R7301 Impaired fasting glucose: Secondary | ICD-10-CM | POA: Diagnosis not present

## 2021-03-23 DIAGNOSIS — J449 Chronic obstructive pulmonary disease, unspecified: Secondary | ICD-10-CM | POA: Diagnosis not present

## 2021-03-23 DIAGNOSIS — I4891 Unspecified atrial fibrillation: Secondary | ICD-10-CM | POA: Diagnosis not present

## 2021-03-24 DIAGNOSIS — R82998 Other abnormal findings in urine: Secondary | ICD-10-CM | POA: Diagnosis not present

## 2021-04-21 ENCOUNTER — Other Ambulatory Visit (HOSPITAL_COMMUNITY): Payer: Self-pay | Admitting: Hematology & Oncology

## 2021-04-21 ENCOUNTER — Other Ambulatory Visit: Payer: Self-pay | Admitting: Hematology & Oncology

## 2021-04-21 DIAGNOSIS — M25562 Pain in left knee: Secondary | ICD-10-CM

## 2021-04-21 DIAGNOSIS — G8929 Other chronic pain: Secondary | ICD-10-CM

## 2021-04-23 ENCOUNTER — Telehealth: Payer: Self-pay | Admitting: Radiology

## 2021-04-24 ENCOUNTER — Ambulatory Visit (HOSPITAL_COMMUNITY)
Admission: RE | Admit: 2021-04-24 | Discharge: 2021-04-24 | Disposition: A | Discharge status: Home / Self Care | Payer: Medicare Other | Source: Ambulatory Visit | Attending: Hematology & Oncology | Admitting: Hematology & Oncology

## 2021-04-24 ENCOUNTER — Other Ambulatory Visit: Payer: Self-pay

## 2021-04-24 DIAGNOSIS — M25562 Pain in left knee: Secondary | ICD-10-CM | POA: Insufficient documentation

## 2021-04-24 DIAGNOSIS — G8929 Other chronic pain: Secondary | ICD-10-CM | POA: Insufficient documentation

## 2021-04-24 MED ORDER — IOHEXOL 180 MG/ML  SOLN
10.0000 mL | Freq: Once | INTRAMUSCULAR | Status: AC | PRN
  Administered 2021-04-24: 10 mL via INTRA_ARTICULAR

## 2021-04-24 MED ORDER — METHYLPREDNISOLONE ACETATE 80 MG/ML IJ SUSP
INTRAMUSCULAR | Status: AC
  Filled 2021-04-24: qty 1

## 2021-04-24 MED ORDER — METHYLPREDNISOLONE ACETATE 40 MG/ML INJ SUSP (RADIOLOG
80.0000 mg | Freq: Once | INTRAMUSCULAR | Status: AC
  Administered 2021-04-24: 80 mg via INTRA_ARTICULAR

## 2021-04-24 MED ORDER — METHYLPREDNISOLONE ACETATE 40 MG/ML IJ SUSP
INTRAMUSCULAR | Status: AC
  Filled 2021-04-24: qty 1

## 2021-04-24 MED ORDER — LIDOCAINE HCL (PF) 1 % IJ SOLN
5.0000 mL | Freq: Once | INTRAMUSCULAR | Status: AC
  Administered 2021-04-24: 5 mL via INTRADERMAL

## 2021-04-24 MED ORDER — BUPIVACAINE HCL (PF) 0.25 % IJ SOLN
10.0000 mL | Freq: Once | INTRAMUSCULAR | Status: AC
  Administered 2021-04-24: 10 mL via INTRA_ARTICULAR

## 2021-04-24 MED ORDER — BUPIVACAINE HCL (PF) 0.25 % IJ SOLN
INTRAMUSCULAR | Status: AC
  Filled 2021-04-24: qty 30

## 2021-04-24 NOTE — Procedures (Signed)
CLINICAL DATA: Chronic knee pain EXAM: LEFT KNEE INJECTION UNDER FLUOROSCOPY COMPARISON: 03/13/2020 FLUOROSCOPY TIME: Fluoroscopy Time:  0 MINUTES, 42 SECONDS Radiation Exposure Index (if provided by the fluoroscopic device):  1.2 MGY Number of Acquired Spot Images: 0 PROCEDURE: The risks (including hemorrhage, infection, and allergic reaction, among others), benefits, and alternatives to the procedure were discussed with the patient.  The patient understood and elected to undergo the procedure.    Standard time-out was employed.  Following sterile skin prep and local anesthetic administration consisting of 1% lidocaine, a 21 gauge 1.5 inch needle was advanced into the right knee joint under fluoroscopic guidance.  Advancement into the joint was challenging, presumably due to lateral osteophytes.  After injection of a small volume of Omnipaque 180 to confirm placement, 8 cc of Sensorcaine and 80 mg of Depo-Medrol were injected into the joint.  The needle was subsequently removed and the skin cleansed and bandaged.  No immediate complications were observed.    IMPRESSION: Technically successful fluoroscopically guided therapeutic injection of the left knee.  The lateral patellofemoral approach was challenging, presumably due to osteophytes, and alternative approach such as medial patellofemoral approach might be considered if future injections are required.

## 2021-05-28 NOTE — Progress Notes (Signed)
PATIENT: [name] DOB: 1941/01/04  REASON FOR VISIT: follow up HISTORY FROM: patient  Chief Complaint  Patient presents with   Follow-up   Obstructive Sleep Apnea    Rm 10, with daughter. Here for yearly CPAP f/u. Pt reports doing well on CPAP therapies. Pt reports having problems with her mask straps.      HISTORY OF PRESENT ILLNESS:  06/01/21 ALL:  [name] is a 80 y.o. female here today for follow up for OSA on CPAP. She reports doing well on CPAP. She continues to note a significant air leak. She reports head gear is stretched and Velcro does not attach. She has had trouble with leak for several years. AHI is now elevated at 9/hr.      01/24/19 ALL ([place]): [name] is a 80 y.o. 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  She reports that she is doing very well with CPAP therapy.  She states that she cannot sleep without her machine.  Compliance data dated 12/24/2018 through 01/22/2019 reveals that she is using her machine every day for compliance of 100%.  Every day she used her machine greater than 4 hours.  Average usage was 7 hours and 53 minutes.  AHI was elevated at 7.1 on 4 to 20 cm of water and an EPR of 1.  Review of pressures she is frequently reaching 20 cm of water. She is here today for follow-up.   History (copied from Dr Dohmeier's note on 04/27/2018)   HPI:  [name] is a 80 y.o. female , seen here as in a referral from Dr. Philip Aspen to re-establish sleep care. The patient had been referred by Dr Leonie Man in January 2013, with a BMI of 46 /h underwent on 10-13-2011 PSG with Capnography, resulting in an AHI of 32/ h, RDI of 38/h and CO2 retention. 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 This was followed by CPAP titration on 11-02-2011, to a final 11 cm water with REM sleep rebound. She has used her original CPAP machine ever since.   In July 2018 the patient was admitted to [facility] after she developed an ulcerative bleed in the upper GI tract.  During the hospitalization she was provided positive airway pressure with a full facemask.  She urgently needs supplies as this mask is not well fitting, she is noting that air leaks, and that she has also noted that her humidifier chamber is empty in the morning.  This is not to be the case when she started the CPAP therapy her machine was issued in the year 2013 and she is definitely due for a new one.  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 Leonie Man in the stroke service which led to her referral for the sleep study.  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    Lives in [place], [place] patient has been a compliant CPAP user and her compliance report for the last 30 days shows 97% compliance with an average time of use of 6 hours and 43 minutes.  Her CPAP is an Surveyor, minerals it is set  at 11 cmH2O was 1 cm EPR, residual AHI is 3.8 and the air leaks are horrendous.  She is having a median air leak of 69.5 L/min.   Chief complaint according to patient : need a new machine - leaking mask and machine loses water.    Sleep habits are as follows: dinner is around 7.30PM. Afterwards, she reads on an I pad, talks to friends and relatives long distance. Crafts ( making wreaths). She goes to bed at 10-10.30 , she sleeps in a non- adjustable bed. She sleeps on 3-4  Pillows, reclined on her back.  She stays asleep until 2 AM to go to the toilet. She has sometimes a second break around 4 AM. She dreams- but she is not refreshed, restored when waking up at 5.45 AM , rises not earlier than 6.45 AM.  She has a dry mouth but no headaches.    Sleep medical history and family sleep history:  No other family member has a diagnosis of OSA.    Social history:  Lives alone, widowed since 3/ 2017. 5 adult children, 4 live close.  No smoker, non ETOH use,   She drinks daily iced tea- [employer]. no coffee, some sodas- she cut down 6 month ago, until then 4-6 sodas a day.  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   REVIEW OF SYSTEMS: Out of a complete 14 system review of symptoms, the patient complains only of the following symptoms, none and all other reviewed systems are negative.   ALLERGIES: No Known Allergies  HOME MEDICATIONS: Outpatient Medications Prior to Visit  Medication Sig Dispense Refill   acetaminophen (TYLENOL) 500 MG tablet Take 1,000 mg by mouth 2 (two) times daily.     albuterol (VENTOLIN HFA) 108 (90 Base) MCG/ACT inhaler Inhale 2 puffs into the lungs every 6 (six) hours as needed for wheezing or shortness of breath.      amLODipine (NORVASC) 5 MG tablet Take 5 mg by mouth daily.     ASPERCREME LIDOCAINE EX Apply 1 application topically 2 (two) times daily as needed (knee pain).      benzonatate (TESSALON) 100 MG capsule Take 1 capsule (100 mg total) by mouth every 8 (eight) hours. 21 capsule 0   budesonide (PULMICORT) 0.5 MG/2ML nebulizer solution Take 2 mLs (0.5 mg total) by nebulization in the morning and at bedtime. 120 mL 11   DULoxetine (CYMBALTA) 60 MG capsule Take 60 mg by mouth daily.     EPINEPHrine (EPIPEN 2-PAK) 0.3 mg/0.3 mL IJ SOAJ injection Inject 0.3 mg into the muscle as needed for anaphylaxis. Call 9-1-1 after use 1 each 2   formoterol (PERFOROMIST) 20 MCG/2ML nebulizer solution Take 2 mLs (20 mcg total) by nebulization 2 (two) times daily. J45. 909 Run Part B 120 mL 11   furosemide (LASIX) 40 MG tablet Take 40 mg by mouth daily as needed (for fluid retention.).      guaiFENesin (MUCINEX) 600 MG 12 hr tablet Take by mouth 2 (two)  times daily as needed.     ipratropium (ATROVENT) 0.02 % nebulizer solution Take 0.5 mg by nebulization every 4 (four) hours as needed for wheezing or shortness of breath.     Menthol, Topical Analgesic, 4 % GEL Apply 1 application topically 2 (two) times daily as needed (knee pain).      Mepolizumab (NUCALA) 100 MG/ML SOAJ Inject 1 mL (100 mg total) into the skin every 28 (twenty-eight) days. 1 mL 5  metoprolol tartrate (LOPRESSOR) 50 MG tablet Take 50 mg by mouth 2 (two) times daily.     montelukast (SINGULAIR) 10 MG tablet Take 10 mg by mouth daily.     Multiple Vitamin (MULTIVITAMIN WITH MINERALS) TABS tablet Take 1 tablet by mouth daily.     olmesartan-hydrochlorothiazide (BENICAR HCT) 40-12.5 MG tablet Take 1 tablet by mouth daily.     ondansetron (ZOFRAN) 4 MG tablet Take 1 tablet (4 mg total) by mouth every 8 (eight) hours as needed for nausea or vomiting. 12 tablet 0   potassium chloride SA (K-DUR,KLOR-CON) 20 MEQ tablet Take 20 mEq by mouth daily as needed (With lasix).      pravastatin (PRAVACHOL) 20 MG tablet Take 20 mg by mouth daily.     PRESCRIPTION MEDICATION Inhale into the lungs at bedtime. CPAP     Vitamin D, Ergocalciferol, (DRISDOL) 50000 UNITS CAPS Take 50,000 Units by mouth every Monday.      No facility-administered medications prior to visit.    PAST MEDICAL HISTORY: Past Medical History:  Diagnosis Date   Anxiety    Arthritis    Asthma    hx of   Back pain    Chronic constipation    Depression    hx of   Dyspnea    Family history of adverse reaction to anesthesia    DAD bp ELEVATED    GERD (gastroesophageal reflux disease)    High cholesterol    Hypertension    Lymphoma ([place])    Obesity    Sleep apnea    uses a CPAP   Stroke ([place])    2010   SVT (supraventricular tachycardia) ([place])     PAST SURGICAL HISTORY: Past Surgical History:  Procedure Laterality Date   BACK SURGERY  2009   for lymphoma   BIOPSY  05/24/2019   Procedure: BIOPSY;   Surgeon: Doran Stabler, MD;  Location: [facility];  Service: Gastroenterology;;   BONE BIOPSY     CESAREAN SECTION     ESOPHAGOGASTRODUODENOSCOPY     ESOPHAGOGASTRODUODENOSCOPY (EGD) WITH PROPOFOL N/A 08/05/2017   Procedure: ESOPHAGOGASTRODUODENOSCOPY (EGD) WITH PROPOFOL;  Surgeon: Doran Stabler, MD;  Location: [facility];  Service: Gastroenterology;  Laterality: N/A;   ESOPHAGOGASTRODUODENOSCOPY (EGD) WITH PROPOFOL N/A 05/24/2019   Procedure: ESOPHAGOGASTRODUODENOSCOPY (EGD) WITH PROPOFOL;  Surgeon: Doran Stabler, MD;  Location: [facility];  Service: Gastroenterology;  Laterality: N/A;   EYE SURGERY     cataracts   LAPAROSCOPY N/A 02/01/2017   Procedure: LAPAROSCOPY DIAGNOSTIC;  Surgeon: Erroll Luna, MD;  Location: [facility];  Service: General;  Laterality: N/A;   REPLACEMENT TOTAL KNEE     right   TOTAL ABDOMINAL HYSTERECTOMY  1973    FAMILY HISTORY: Family History  Problem Relation Age of Onset   Lymphoma Mother    Breast cancer Mother    Coronary artery disease Father 5       died   Stroke Father    Colon cancer Maternal Uncle     SOCIAL HISTORY: Social History   Socioeconomic History   Marital status: Widowed    Spouse name: Not on file   Number of children: 5   Years of education: Not on file   Highest education level: Not on file  Occupational History   Occupation: Chemical engineer  Tobacco Use   Smoking status: Never   Smokeless tobacco: Never  Vaping Use   Vaping Use: Never used  Substance and Sexual Activity   Alcohol use:  No   Drug use: No   Sexual activity: Not Currently  Other Topics Concern   Not on file  Social History Narrative   Not on file   Social Determinants of Health   Financial Resource Strain: Not on file  Food Insecurity: Not on file  Transportation Needs: Not on file  Physical Activity: Not on file  Stress: Not on file  Social Connections: Not on file  Intimate Partner Violence: Not on file     PHYSICAL  EXAM  Vitals:   06/01/21 1452  BP: 129/62  Pulse: 86  Weight: 266 lb (120.7 kg)  Height: 5\' 1"  (1.549 m)   Body mass index is 50.26 kg/m.  Generalized: Well developed, in no acute distress  Cardiology: normal rate and rhythm, no murmur noted Respiratory: clear to auscultation bilaterally  Neurological examination  Mentation: Alert oriented to time, place, history taking. Follows all commands speech and language fluent Cranial nerve II-XII: Pupils were equal round reactive to light. Extraocular movements were full, visual field were full  Motor: The motor testing reveals 5 over 5 strength of all 4 extremities. Good symmetric motor tone is noted throughout.  Gait and station: Gait is normal.    DIAGNOSTIC DATA (LABS, IMAGING, TESTING) - I reviewed patient records, labs, notes, testing and imaging myself where available.  No flowsheet data found.   Lab Results  Component Value Date   WBC 6.9 07/20/2019   HGB 10.1 (L) 07/20/2019   HCT 34.8 (L) 07/20/2019   MCV 83.7 07/20/2019   PLT 354 07/20/2019      Component Value Date/Time   NA 136 07/20/2019 1621   K 3.7 07/20/2019 1621   CL 98 07/20/2019 1621   CO2 30 07/20/2019 1621   GLUCOSE 108 (H) 07/20/2019 1621   BUN 15 07/20/2019 1621   CREATININE 0.93 07/20/2019 1621   CALCIUM 8.8 (L) 07/20/2019 1621   PROT 5.1 (L) 03/31/2019 0302   ALBUMIN 1.9 (L) 03/31/2019 0302   AST 10 (L) 03/31/2019 0302   ALT 12 03/31/2019 0302   ALKPHOS 48 03/31/2019 0302   BILITOT 1.1 03/31/2019 0302   GFRNONAA 58 (L) 07/20/2019 1621   GFRAA >60 07/20/2019 1621   Lab Results  Component Value Date   CHOL 191 07/19/2011   HDL 35 (L) 07/19/2011   LDLCALC 111 (H) 07/19/2011   TRIG 223 (H) 07/19/2011   CHOLHDL 5.5 07/19/2011   Lab Results  Component Value Date   HGBA1C 6.2 (H) 07/19/2011   Lab Results  Component Value Date   VITAMINB12 404 06/12/2008   Lab Results  Component Value Date   TSH 2.704 02/07/2017     ASSESSMENT AND  PLAN 80 y.o. year old female  has a past medical history of Anxiety, Arthritis, Asthma, Back pain, Chronic constipation, Depression, Dyspnea, Family history of adverse reaction to anesthesia, GERD (gastroesophageal reflux disease), High cholesterol, Hypertension, Lymphoma ([place]), Obesity, Sleep apnea, Stroke ([place]), and SVT (supraventricular tachycardia) ([place]). here with     ICD-10-CM   1. 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 Compliance report reveals excellent compliance. She was encouraged to continue using CPAP nightly and for greater than 4 hours each night. I will send her to DME for mask refitting and review of headgear fit. I will recheck AHI in 8 weeks. If it remains elevated we will consider titration study and she is requiring max pressure most  nights. Risks of untreated sleep apnea review and education materials provided. Healthy lifestyle habits encouraged. She will follow up in 1 year, sooner if needed. She verbalizes understanding and agreement with this plan.    No orders of the defined types were placed in this encounter.    No orders of the defined types were placed in this encounter.     Debbora Presto, FNP-C 06/01/2021, 3:04 PM [facility] [address] [place], [place] [address] [phone]

## 2021-05-28 NOTE — Patient Instructions (Signed)
Please continue using your CPAP regularly. While your insurance requires that you use CPAP at least 4 hours each night on 70% of the nights, I recommend, that you not skip any nights and use it throughout the night if you can. Getting used to CPAP and staying with the treatment long term does take time and patience and discipline. Untreated obstructive sleep apnea when it is moderate to severe can have an adverse impact on cardiovascular health and raise her risk for heart disease, arrhythmias, hypertension, congestive heart failure, stroke and diabetes. Untreated obstructive sleep apnea causes sleep disruption, nonrestorative sleep, and sleep deprivation. This can have an impact on your day to day functioning and cause daytime sleepiness and impairment of cognitive function, memory loss, mood disturbance, and problems focussing. Using CPAP regularly can improve these symptoms.  We will send orders for mask refitting and check of your supplies. Please let us know if you do not hear back from [facility] by the end of the week.   Follow up in about 1 year pending correction in air leak, sooner if needed.

## 2021-06-01 ENCOUNTER — Ambulatory Visit (INDEPENDENT_AMBULATORY_CARE_PROVIDER_SITE_OTHER): Payer: Medicare Other | Admitting: Family Medicine

## 2021-06-01 ENCOUNTER — Encounter: Payer: Self-pay | Admitting: Family Medicine

## 2021-06-01 VITALS — BP 129/62 | HR 86 | Ht 61.0 in | Wt 266.0 lb

## 2021-06-01 DIAGNOSIS — G4731 Primary central sleep apnea: Secondary | ICD-10-CM

## 2021-06-01 NOTE — Progress Notes (Signed)
CM sent to [facility] 

## 2021-06-05 ENCOUNTER — Telehealth: Payer: Self-pay | Admitting: Pharmacist

## 2021-06-05 NOTE — Telephone Encounter (Signed)
Patient is scheduled for 11/10 at 4pm with Dr. Silas Flood

## 2021-06-05 NOTE — Telephone Encounter (Signed)
Patient last seen by Dr. Silas Flood on 12/22/20 and started on Nucala biologic since then. She should be seen within 6 months of starting biologic to assess response. Routing to front desl scheduling team to have f/u appt scheduled  Knox Saliva, PharmD, MPH, BCPS Clinical Pharmacist (Rheumatology and Pulmonology)

## 2021-06-11 ENCOUNTER — Other Ambulatory Visit: Payer: Self-pay

## 2021-06-11 ENCOUNTER — Encounter: Payer: Self-pay | Admitting: Pulmonary Disease

## 2021-06-11 ENCOUNTER — Ambulatory Visit (INDEPENDENT_AMBULATORY_CARE_PROVIDER_SITE_OTHER): Payer: Medicare Other | Admitting: Pulmonary Disease

## 2021-06-11 VITALS — BP 122/70 | HR 72 | Ht 61.0 in | Wt 270.0 lb

## 2021-06-11 DIAGNOSIS — J4540 Moderate persistent asthma, uncomplicated: Secondary | ICD-10-CM | POA: Diagnosis not present

## 2021-06-11 NOTE — Patient Instructions (Signed)
Nice to see you again  I am glad the injections are helping control your breathing and cough.  We will continue them.  Continue your nebulizers as well.  Let me know if you need any refills.  We will be on the look out for the paperwork for the [facility].  Return to clinic in 6 months or sooner as needed with Dr. Silas Flood

## 2021-06-12 NOTE — Progress Notes (Signed)
@Patient  ID: [name], female    DOB: 1940-12-11, 80 y.o.   MRN: [id number]  Chief Complaint  Patient presents with   Follow-up    F/U after starting Nucala. States she has been doing well since starting. Denies any new concerns.     Referring provider: Leanna Battles, MD  HPI:   80 y.o. 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 Started [facility] Summer 2022. Multiple pharmacy notes reviewed.  Doing well. Last seen ~6 months ago. Started Nucala in interim. Cough resolved.  Dyspnea much improved.  Very satisfied with symptoms.  Accompanied by her daughter today.  Reports good adherence to nebulized ICS/LABA therapy as well.  No  HPI at initial visit:  Patient contracted Covid 07/2019.  She does stay at home.  She got monoclonal antibody infusion.  Since then, she has had about every month exacerbation of her underlying with presumably asthma.  Wheeze, worsening dyspnea exertion, cough.  She has gotten a course of prednisone and antibiotics.  Been told she has pneumonia.  Presently based on chest x-rays that I cannot observe or view.    Cough is productive.  Green to yellowish phlegm.  Seems may be worse in the mornings and evenings.  No environmental or seasonal changes.  No other alleviating or exacerbating factors.  Dyspnea on exertion obviously worse when she exerts her self,, better with rest.  Has developed shortness of breath at rest as well.  No timing.  They were things are better or worse.  No alleviating or exacerbating factors.  She has been using Trelegy and does not think it helps.  Most recent chest x-ray 07/2019 reviewed which reveals clear lungs, streaky opacity left base, heart size appears enlarged on my interpretation.   PMH: Asthma, sleep apnea, back pain Surgical history: C-section, knee replacement, hysterectomy, Family history: Mother with breast cancer, father with CAD, CVA   Questionaires  / Pulmonary Flowsheets:   ACT:  Asthma Control Test ACT Total Score  12/22/2020 10  10/22/2020 7    MMRC: mMRC Dyspnea Scale mMRC Score  06/11/2021 2    Epworth:  No flowsheet data found.  Tests:   FENO:  No results found for: NITRICOXIDE  PFT: No flowsheet data found.  WALK:  No flowsheet data found.  Imaging: Personally reviewed and as per EMR and discussion of this note No results found.  Lab Results: Personally reviewed CBC    Component Value Date/Time   WBC 6.9 07/20/2019 1621   RBC 4.16 07/20/2019 1621   HGB 10.1 (L) 07/20/2019 1621   HGB 11.2 (L) 11/18/2008 1132   HCT 34.8 (L) 07/20/2019 1621   HCT 33.6 (L) 11/18/2008 1132   PLT 354 07/20/2019 1621   PLT 235 11/18/2008 1132   MCV 83.7 07/20/2019 1621   MCV 91.2 11/18/2008 1132   MCH 24.3 (L) 07/20/2019 1621   MCHC 29.0 (L) 07/20/2019 1621   RDW 16.4 (H) 07/20/2019 1621   RDW 14.7 (H) 11/18/2008 1132   LYMPHSABS 1.5 07/20/2019 1621   LYMPHSABS 0.5 (L) 11/18/2008 1132   MONOABS 1.1 (H) 07/20/2019 1621   MONOABS 0.0 (L) 11/18/2008 1132   EOSABS 0.1 07/20/2019 1621   EOSABS 0.5 11/18/2008 1132   BASOSABS 0.1 07/20/2019 1621   BASOSABS 0.0 11/18/2008 1132    BMET    Component Value Date/Time   NA 136 07/20/2019 1621   K 3.7 07/20/2019 1621   CL 98 07/20/2019  1621   CO2 30 07/20/2019 1621   GLUCOSE 108 (H) 07/20/2019 1621   BUN 15 07/20/2019 1621   CREATININE 0.93 07/20/2019 1621   CALCIUM 8.8 (L) 07/20/2019 1621   GFRNONAA 58 (L) 07/20/2019 1621   GFRAA >60 07/20/2019 1621    BNP    Component Value Date/Time   BNP 74.2 01/31/2017 1655    ProBNP    Component Value Date/Time   PROBNP 240.0 (H) 06/18/2008 1657    Specialty Problems       Pulmonary Problems   Complex sleep apnea syndrome   Sleeps in sitting position due to orthopnea   Moderate persistent asthma    No Known Allergies  Immunization History  Administered Date(s) Administered   Influenza Split 05/13/2011,  05/25/2012   Influenza,inj,quad, With Preservative 06/14/2013   Influenza-Unspecified 05/02/2017   Pneumococcal Polysaccharide-23 07/20/2011    Past Medical History:  Diagnosis Date   Anxiety    Arthritis    Asthma    hx of   Back pain    Chronic constipation    Depression    hx of   Dyspnea    Family history of adverse reaction to anesthesia    DAD bp ELEVATED    GERD (gastroesophageal reflux disease)    High cholesterol    Hypertension    Lymphoma ([place])    Obesity    Sleep apnea    uses a CPAP   Stroke ([place])    2010   SVT (supraventricular tachycardia) ([place])     Tobacco History: Social History   Tobacco Use  Smoking Status Never  Smokeless Tobacco Never   Counseling given: Not Answered   Continue to not smoke  Outpatient Encounter Medications as of 06/11/2021  Medication Sig   acetaminophen (TYLENOL) 500 MG tablet Take 1,000 mg by mouth 2 (two) times daily.   albuterol (VENTOLIN HFA) 108 (90 Base) MCG/ACT inhaler Inhale 2 puffs into the lungs every 6 (six) hours as needed for wheezing or shortness of breath.    amLODipine (NORVASC) 5 MG tablet Take 5 mg by mouth daily.   ASPERCREME LIDOCAINE EX Apply 1 application topically 2 (two) times daily as needed (knee pain).    benzonatate (TESSALON) 100 MG capsule Take 1 capsule (100 mg total) by mouth every 8 (eight) hours.   budesonide (PULMICORT) 0.5 MG/2ML nebulizer solution Take 2 mLs (0.5 mg total) by nebulization in the morning and at bedtime.   DULoxetine (CYMBALTA) 60 MG capsule Take 60 mg by mouth daily.   EPINEPHrine (EPIPEN 2-PAK) 0.3 mg/0.3 mL IJ SOAJ injection Inject 0.3 mg into the muscle as needed for anaphylaxis. Call 9-1-1 after use   formoterol (PERFOROMIST) 20 MCG/2ML nebulizer solution Take 2 mLs (20 mcg total) by nebulization 2 (two) times daily. J45. [address] Part B   furosemide (LASIX) 40 MG tablet Take 40 mg by mouth daily as needed (for fluid retention.).    guaiFENesin (MUCINEX) 600 MG 12 hr  tablet Take by mouth 2 (two) times daily as needed.   ipratropium (ATROVENT) 0.02 % nebulizer solution Take 0.5 mg by nebulization every 4 (four) hours as needed for wheezing or shortness of breath.   Menthol, Topical Analgesic, 4 % GEL Apply 1 application topically 2 (two) times daily as needed (knee pain).    Mepolizumab (NUCALA) 100 MG/ML SOAJ Inject 1 mL (100 mg total) into the skin every 28 (twenty-eight) days.   metoprolol tartrate (LOPRESSOR) 50 MG tablet Take 50 mg by mouth 2 (two)  times daily.   montelukast (SINGULAIR) 10 MG tablet Take 10 mg by mouth daily.   Multiple Vitamin (MULTIVITAMIN WITH MINERALS) TABS tablet Take 1 tablet by mouth daily.   olmesartan-hydrochlorothiazide (BENICAR HCT) 40-12.5 MG tablet Take 1 tablet by mouth daily.   ondansetron (ZOFRAN) 4 MG tablet Take 1 tablet (4 mg total) by mouth every 8 (eight) hours as needed for nausea or vomiting.   potassium chloride SA (K-DUR,KLOR-CON) 20 MEQ tablet Take 20 mEq by mouth daily as needed (With lasix).    pravastatin (PRAVACHOL) 20 MG tablet Take 20 mg by mouth daily.   PRESCRIPTION MEDICATION Inhale into the lungs at bedtime. CPAP   Vitamin D, Ergocalciferol, (DRISDOL) 50000 UNITS CAPS Take 50,000 Units by mouth every Monday.    No facility-administered encounter medications on file as of 06/11/2021.     Review of Systems  Review of Systems  n/a Physical Exam  BP 122/70   Pulse 72   Ht 5\' 1"  (1.549 m)   Wt 270 lb (122.5 kg)   SpO2 97%   BMI 51.02 kg/m   Wt Readings from Last 5 Encounters:  06/11/21 270 lb (122.5 kg)  06/01/21 266 lb (120.7 kg)  10/29/20 260 lb 1.6 oz (118 kg)  10/22/20 264 lb 9.6 oz (120 kg)  05/22/19 267 lb (121.1 kg)    BMI Readings from Last 5 Encounters:  06/11/21 51.02 kg/m  06/01/21 50.26 kg/m  10/29/20 49.15 kg/m  10/22/20 50.00 kg/m  05/22/19 50.45 kg/m     Physical Exam General: Well-appearing, no acute distress Eyes: EOMI, no icterus Neck: Supple, no  JVP Pulmonary: Lungs clear, no wheezes (wheeze present at last visit), normal work of breathing Cardiovascular: Regular rate and rhythm, no murmurs Neuro: Seated in wheelchair, no focal weakness, sensation intact   Assessment & Plan:   Dyspnea exertion: Likely multifactorial related to deconditioning, obesity, concern for uncontrolled asthma given multiple prednisone courses with mild improvement each time, possible other cardiac etiologies.  Echocardiogram reassuring.  Improved with Nucala.  Asthma: Worsening dyspnea on exertion, shortness of breath, cough the early 2022.  Exacerbations through the spring.  Adherent to ICS/LABA nebulizer.  Despite this, ongoing symptoms.  Nucala started summer 2022.  Resolution of cough, greatly improved dyspnea on exertion.  To continue Nucala and nebulized therapies.   Return in about 6 months (around 12/09/2021).   Lanier Clam, MD 06/12/2021

## 2021-06-18 ENCOUNTER — Telehealth: Payer: Self-pay | Admitting: Family Medicine

## 2021-06-18 NOTE — Telephone Encounter (Signed)
Called daughter back. Advised order placed back on 06/01/21 for mask refit and make sure she had correct headgear. I sent community message to [employer] again today asking they follow up with her. She verbalized understanding.

## 2021-06-18 NOTE — Telephone Encounter (Signed)
Pt's daughter is asking to be called to confirm what type of appointment needs to be made with DME.  Daughter called DME and they told her they have no record of an appointment being needed to be made for pt.

## 2021-06-30 DIAGNOSIS — Z20822 Contact with and (suspected) exposure to covid-19: Secondary | ICD-10-CM | POA: Diagnosis not present

## 2021-07-07 ENCOUNTER — Telehealth: Payer: Self-pay | Admitting: Pulmonary Disease

## 2021-07-07 NOTE — Telephone Encounter (Signed)
Called patient and advised that she receives through PAP and we will just have to ensure PA for Nucala is approved to send to [facility] [place]. She states she is due 07/09/21 and does not have injection but suggested I speak with her daughter, Darnelle Bos.  [place] who states she will plan to complete [facility] PAP renewal application and submit to company directly herself. She has been advised to call our clinic once her portion is submitted so that we can submit Dr. Kavin Leech portion. She verbalized understanding.  She will also be calling to schedule Nucala refill.  Provider portion placed in Dr. Kavin Leech mailbox to be signed.  Knox Saliva, PharmD, MPH, BCPS Clinical Pharmacist (Rheumatology and Pulmonology)

## 2021-07-08 NOTE — Telephone Encounter (Signed)
Provider portion received and placed in "Awaiting Response" folder. 

## 2021-07-17 NOTE — Telephone Encounter (Signed)
Routing to pharmacy team as an Pharmacist, hospital.

## 2021-07-17 NOTE — Telephone Encounter (Signed)
[name] daughter states sending in form for Nucala today. [name] phone number is [phone].

## 2021-07-22 NOTE — Telephone Encounter (Signed)
Submitted Patient Assistance RENEWAL Application to [facility] [place] for [employer] along with provider portion, insurance card copy (including Medicare), and med list.  Will update patient when we receive a response.  Fax# [phone] Phone# [phone]  Knox Saliva, PharmD, MPH, BCPS Clinical Pharmacist (Rheumatology and Pulmonology)

## 2021-08-11 ENCOUNTER — Telehealth: Payer: Self-pay | Admitting: Family Medicine

## 2021-08-11 NOTE — Telephone Encounter (Signed)
Please contact her and make sure she was able to new headgear and mask. Her report continues to show elevated leak and AHI. If she has gotten new supplies, we may need to send her to DME for reeducation on appropriate fit. TY!

## 2021-08-12 NOTE — Telephone Encounter (Signed)
Per [facility], patient's Nucala PAP renewal application has been received and is being reviewed. Application is complete, but may take additional business days to process   Knox Saliva, PharmD, MPH, BCPS Clinical Pharmacist (Rheumatology and Pulmonology)

## 2021-08-19 NOTE — Telephone Encounter (Signed)
Per rep at [facility], PAP renewal application is still processing.  Knox Saliva, PharmD, MPH, BCPS Clinical Pharmacist (Rheumatology and Pulmonology)

## 2021-09-02 NOTE — Telephone Encounter (Signed)
Spoke with rep at [employer] [facility] [place] and informed them that the pt's portion had been previously sent in separate from the remainder of portion. Rep verified that she was able to see patient portion and reached out and confirmed that form was valid to use with current application. Will await determination letter.

## 2021-09-02 NOTE — Telephone Encounter (Signed)
Called [facility] to follow up on patient's application, rep states application was missing patient signature. Will route to team to follow up and resubmit.  Phone# [phone]

## 2021-09-03 NOTE — Telephone Encounter (Signed)
Received a fax from  [place] [facility] [place] regarding an approval for [name] patient assistance from now until the end of the year. LVM with pt's daughter providing update. Approval letter sent to the scan center.

## 2021-09-24 DIAGNOSIS — Z7951 Long term (current) use of inhaled steroids: Secondary | ICD-10-CM | POA: Diagnosis not present

## 2021-09-24 DIAGNOSIS — Z7982 Long term (current) use of aspirin: Secondary | ICD-10-CM | POA: Diagnosis not present

## 2021-09-24 DIAGNOSIS — Z79899 Other long term (current) drug therapy: Secondary | ICD-10-CM | POA: Diagnosis not present

## 2021-09-24 DIAGNOSIS — I110 Hypertensive heart disease with heart failure: Secondary | ICD-10-CM | POA: Diagnosis not present

## 2021-09-24 DIAGNOSIS — D508 Other iron deficiency anemias: Secondary | ICD-10-CM | POA: Diagnosis not present

## 2021-09-24 DIAGNOSIS — E785 Hyperlipidemia, unspecified: Secondary | ICD-10-CM | POA: Diagnosis not present

## 2021-09-24 DIAGNOSIS — C8338 Diffuse large B-cell lymphoma, lymph nodes of multiple sites: Secondary | ICD-10-CM | POA: Diagnosis not present

## 2021-09-24 DIAGNOSIS — M5459 Other low back pain: Secondary | ICD-10-CM | POA: Diagnosis not present

## 2021-09-24 DIAGNOSIS — Z79891 Long term (current) use of opiate analgesic: Secondary | ICD-10-CM | POA: Diagnosis not present

## 2021-09-24 DIAGNOSIS — D509 Iron deficiency anemia, unspecified: Secondary | ICD-10-CM | POA: Diagnosis not present

## 2021-09-24 DIAGNOSIS — J449 Chronic obstructive pulmonary disease, unspecified: Secondary | ICD-10-CM | POA: Diagnosis not present

## 2021-09-24 DIAGNOSIS — I509 Heart failure, unspecified: Secondary | ICD-10-CM | POA: Diagnosis not present

## 2021-09-24 DIAGNOSIS — J45909 Unspecified asthma, uncomplicated: Secondary | ICD-10-CM | POA: Diagnosis not present

## 2021-09-24 DIAGNOSIS — M545 Low back pain, unspecified: Secondary | ICD-10-CM | POA: Diagnosis not present

## 2021-09-24 DIAGNOSIS — G8929 Other chronic pain: Secondary | ICD-10-CM | POA: Diagnosis not present

## 2021-09-24 DIAGNOSIS — Z8673 Personal history of transient ischemic attack (TIA), and cerebral infarction without residual deficits: Secondary | ICD-10-CM | POA: Diagnosis not present

## 2021-10-02 DIAGNOSIS — Z1231 Encounter for screening mammogram for malignant neoplasm of breast: Secondary | ICD-10-CM | POA: Diagnosis not present

## 2021-10-04 ENCOUNTER — Other Ambulatory Visit: Payer: Self-pay | Admitting: Pulmonary Disease

## 2021-10-04 DIAGNOSIS — J4541 Moderate persistent asthma with (acute) exacerbation: Secondary | ICD-10-CM

## 2021-11-19 DIAGNOSIS — Z9221 Personal history of antineoplastic chemotherapy: Secondary | ICD-10-CM | POA: Diagnosis not present

## 2021-11-19 DIAGNOSIS — I5020 Unspecified systolic (congestive) heart failure: Secondary | ICD-10-CM | POA: Diagnosis not present

## 2021-11-19 DIAGNOSIS — I10 Essential (primary) hypertension: Secondary | ICD-10-CM | POA: Diagnosis not present

## 2021-11-19 DIAGNOSIS — I110 Hypertensive heart disease with heart failure: Secondary | ICD-10-CM | POA: Diagnosis not present

## 2021-11-19 DIAGNOSIS — Z7982 Long term (current) use of aspirin: Secondary | ICD-10-CM | POA: Diagnosis not present

## 2021-11-19 DIAGNOSIS — D509 Iron deficiency anemia, unspecified: Secondary | ICD-10-CM | POA: Diagnosis not present

## 2021-11-19 DIAGNOSIS — J45909 Unspecified asthma, uncomplicated: Secondary | ICD-10-CM | POA: Diagnosis not present

## 2021-11-19 DIAGNOSIS — C8330 Diffuse large B-cell lymphoma, unspecified site: Secondary | ICD-10-CM | POA: Diagnosis not present

## 2021-11-19 DIAGNOSIS — J449 Chronic obstructive pulmonary disease, unspecified: Secondary | ICD-10-CM | POA: Diagnosis not present

## 2021-11-19 DIAGNOSIS — K5900 Constipation, unspecified: Secondary | ICD-10-CM | POA: Diagnosis not present

## 2021-11-19 DIAGNOSIS — J302 Other seasonal allergic rhinitis: Secondary | ICD-10-CM | POA: Diagnosis not present

## 2021-11-19 DIAGNOSIS — G8929 Other chronic pain: Secondary | ICD-10-CM | POA: Diagnosis not present

## 2021-11-19 DIAGNOSIS — E785 Hyperlipidemia, unspecified: Secondary | ICD-10-CM | POA: Diagnosis not present

## 2021-11-19 DIAGNOSIS — C8338 Diffuse large B-cell lymphoma, lymph nodes of multiple sites: Secondary | ICD-10-CM | POA: Diagnosis not present

## 2021-11-19 DIAGNOSIS — R600 Localized edema: Secondary | ICD-10-CM | POA: Diagnosis not present

## 2021-11-19 DIAGNOSIS — Z20822 Contact with and (suspected) exposure to covid-19: Secondary | ICD-10-CM | POA: Diagnosis not present

## 2021-11-19 DIAGNOSIS — I509 Heart failure, unspecified: Secondary | ICD-10-CM | POA: Diagnosis not present

## 2021-11-19 DIAGNOSIS — K275 Chronic or unspecified peptic ulcer, site unspecified, with perforation: Secondary | ICD-10-CM | POA: Diagnosis not present

## 2021-11-19 DIAGNOSIS — I639 Cerebral infarction, unspecified: Secondary | ICD-10-CM | POA: Diagnosis not present

## 2021-11-19 DIAGNOSIS — M545 Low back pain, unspecified: Secondary | ICD-10-CM | POA: Diagnosis not present

## 2021-12-08 DIAGNOSIS — Z20822 Contact with and (suspected) exposure to covid-19: Secondary | ICD-10-CM | POA: Diagnosis not present

## 2022-02-01 ENCOUNTER — Other Ambulatory Visit (HOSPITAL_COMMUNITY): Payer: Self-pay | Admitting: Internal Medicine

## 2022-02-01 ENCOUNTER — Other Ambulatory Visit: Payer: Self-pay | Admitting: Internal Medicine

## 2022-02-01 DIAGNOSIS — M25562 Pain in left knee: Secondary | ICD-10-CM

## 2022-02-08 DIAGNOSIS — H35033 Hypertensive retinopathy, bilateral: Secondary | ICD-10-CM | POA: Diagnosis not present

## 2022-02-08 DIAGNOSIS — H26493 Other secondary cataract, bilateral: Secondary | ICD-10-CM | POA: Diagnosis not present

## 2022-02-08 DIAGNOSIS — D23111 Other benign neoplasm of skin of right upper eyelid, including canthus: Secondary | ICD-10-CM | POA: Diagnosis not present

## 2022-02-08 DIAGNOSIS — H40023 Open angle with borderline findings, high risk, bilateral: Secondary | ICD-10-CM | POA: Diagnosis not present

## 2022-03-08 ENCOUNTER — Telehealth: Payer: Self-pay | Admitting: Pulmonary Disease

## 2022-03-08 NOTE — Telephone Encounter (Signed)
ATC LVMTCB x1 Pt will need to be scheduled with OV with Dr. Silas Flood before Geradine Girt can be refilled.

## 2022-03-09 NOTE — Telephone Encounter (Signed)
Please call to make a follow up for this patient with Dr. Silas Flood for her nucala refills. Thanks

## 2022-03-12 ENCOUNTER — Ambulatory Visit (INDEPENDENT_AMBULATORY_CARE_PROVIDER_SITE_OTHER): Payer: Medicare Other | Admitting: Pulmonary Disease

## 2022-03-12 ENCOUNTER — Encounter: Payer: Self-pay | Admitting: Pulmonary Disease

## 2022-03-12 DIAGNOSIS — J4541 Moderate persistent asthma with (acute) exacerbation: Secondary | ICD-10-CM | POA: Diagnosis not present

## 2022-03-12 MED ORDER — NUCALA 100 MG/ML ~~LOC~~ SOAJ: 100.0000 mg | SUBCUTANEOUS | 11 refills | Status: DC

## 2022-03-12 MED ORDER — BUDESONIDE 0.5 MG/2ML IN SUSP: RESPIRATORY_TRACT | 10 refills | Status: DC

## 2022-03-12 MED ORDER — IPRATROPIUM BROMIDE 0.02 % IN SOLN: 0.5000 mg | RESPIRATORY_TRACT | 5 refills | Status: DC | PRN

## 2022-03-12 MED ORDER — ALBUTEROL SULFATE HFA 108 (90 BASE) MCG/ACT IN AERS: 2.0000 | INHALATION_SPRAY | Freq: Four times a day (QID) | RESPIRATORY_TRACT | 6 refills | Status: DC | PRN

## 2022-03-12 MED ORDER — EPINEPHRINE 0.3 MG/0.3ML IJ SOAJ: 0.3000 mg | INTRAMUSCULAR | 2 refills | Status: AC | PRN

## 2022-03-12 MED ORDER — FORMOTEROL FUMARATE 20 MCG/2ML IN NEBU: INHALATION_SOLUTION | RESPIRATORY_TRACT | 10 refills | Status: DC

## 2022-03-12 NOTE — Progress Notes (Signed)
$'@Patient'x$  ID: [name], female    DOB: [birth year], 81 y.o.   MRN: [id number]  Chief Complaint  Patient presents with   Follow-up    Pt is here for follow up for asthma. Pt is here to get refill son medications. Pt states that she is been doing well since last year. No issues noted with asthma    Referring provider: Donnajean Lopes, MD  HPI:   81 y.o. 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 Started [facility] Summer 2022.   Doing well. Last seen ~6 months ago.  Continues on Nucala.  On triple inhaled therapy via budesonide, arformoterol, Atrovent nebulized.  Symptoms well controlled.  Occasional morning cough reduced clear to white phlegm.  This morning noted yellow phlegm.  No fever.  No chills.  No worsening dyspnea.  No sinus pressure or headache.  HPI at initial visit:  Patient contracted Covid 07/2019.  She does stay at home.  She got monoclonal antibody infusion.  Since then, she has had about every month exacerbation of her underlying with presumably asthma.  Wheeze, worsening dyspnea exertion, cough.  She has gotten a course of prednisone and antibiotics.  Been told she has pneumonia.  Presently based on chest x-rays that I cannot observe or view.    Cough is productive.  Green to yellowish phlegm.  Seems may be worse in the mornings and evenings.  No environmental or seasonal changes.  No other alleviating or exacerbating factors.  Dyspnea on exertion obviously worse when she exerts her self,, better with rest.  Has developed shortness of breath at rest as well.  No timing.  They were things are better or worse.  No alleviating or exacerbating factors.  She has been using Trelegy and does not think it helps.  Most recent chest x-ray 07/2019 reviewed which reveals clear lungs, streaky opacity left base, heart size appears enlarged on my interpretation.   PMH: Asthma, sleep apnea, back pain Surgical history:  C-section, knee replacement, hysterectomy, Family history: Mother with breast cancer, father with CAD, CVA   Questionaires / Pulmonary Flowsheets:   ACT:  Asthma Control Test ACT Total Score  03/12/2022  1:54 PM 23  12/22/2020  4:29 PM 10  10/22/2020  4:18 PM 7    MMRC: mMRC Dyspnea Scale mMRC Score  06/11/2021  4:19 PM 2    Epworth:      No data to display          Tests:   FENO:  No results found for: "NITRICOXIDE"  PFT:     No data to display          WALK:      No data to display          Imaging: Personally reviewed and as per EMR and discussion of this note No results found.  Lab Results: Personally reviewed CBC    Component Value Date/Time   WBC 6.9 07/20/2019 1621   RBC 4.16 07/20/2019 1621   HGB 10.1 (L) 07/20/2019 1621   HGB 11.2 (L) 11/18/2008 1132   HCT 34.8 (L) 07/20/2019 1621   HCT 33.6 (L) 11/18/2008 1132   PLT 354 07/20/2019 1621   PLT 235 11/18/2008 1132   MCV 83.7 07/20/2019 1621   MCV 91.2 11/18/2008 1132   MCH 24.3 (L) 07/20/2019 1621   MCHC 29.0 (L) 07/20/2019 1621   RDW 16.4 (H) 07/20/2019 1621   RDW 14.7 (H)  11/18/2008 1132   LYMPHSABS 1.5 07/20/2019 1621   LYMPHSABS 0.5 (L) 11/18/2008 1132   MONOABS 1.1 (H) 07/20/2019 1621   MONOABS 0.0 (L) 11/18/2008 1132   EOSABS 0.1 07/20/2019 1621   EOSABS 0.5 11/18/2008 1132   BASOSABS 0.1 07/20/2019 1621   BASOSABS 0.0 11/18/2008 1132    BMET    Component Value Date/Time   NA 136 07/20/2019 1621   K 3.7 07/20/2019 1621   CL 98 07/20/2019 1621   CO2 30 07/20/2019 1621   GLUCOSE 108 (H) 07/20/2019 1621   BUN 15 07/20/2019 1621   CREATININE 0.93 07/20/2019 1621   CALCIUM 8.8 (L) 07/20/2019 1621   GFRNONAA 58 (L) 07/20/2019 1621   GFRAA >60 07/20/2019 1621    BNP    Component Value Date/Time   BNP 74.2 01/31/2017 1655    ProBNP    Component Value Date/Time   PROBNP 240.0 (H) 06/18/2008 1657    Specialty Problems       Pulmonary Problems   Complex  sleep apnea syndrome   Sleeps in sitting position due to orthopnea   Moderate persistent asthma    No Known Allergies  Immunization History  Administered Date(s) Administered   Influenza Split 05/13/2011, 05/25/2012   Influenza,inj,quad, With Preservative 06/14/2013   Influenza-Unspecified 05/02/2017   Pneumococcal Polysaccharide-23 07/20/2011    Past Medical History:  Diagnosis Date   Anxiety    Arthritis    Asthma    hx of   Back pain    Chronic constipation    Depression    hx of   Dyspnea    Family history of adverse reaction to anesthesia    DAD bp ELEVATED    GERD (gastroesophageal reflux disease)    High cholesterol    Hypertension    Lymphoma ([place])    Obesity    Sleep apnea    uses a CPAP   Stroke ([place])    2010   SVT (supraventricular tachycardia) ([place])     Tobacco History: Social History   Tobacco Use  Smoking Status Never  Smokeless Tobacco Never   Counseling given: Not Answered   Continue to not smoke  Outpatient Encounter Medications as of 03/12/2022  Medication Sig   acetaminophen (TYLENOL) 500 MG tablet Take 1,000 mg by mouth 2 (two) times daily.   amLODipine (NORVASC) 5 MG tablet Take 5 mg by mouth daily.   ASPERCREME LIDOCAINE EX Apply 1 application topically 2 (two) times daily as needed (knee pain).    benzonatate (TESSALON) 100 MG capsule Take 1 capsule (100 mg total) by mouth every 8 (eight) hours.   DULoxetine (CYMBALTA) 60 MG capsule Take 60 mg by mouth daily.   furosemide (LASIX) 40 MG tablet Take 40 mg by mouth daily as needed (for fluid retention.).    guaiFENesin (MUCINEX) 600 MG 12 hr tablet Take by mouth 2 (two) times daily as needed.   Menthol, Topical Analgesic, 4 % GEL Apply 1 application topically 2 (two) times daily as needed (knee pain).    metoprolol tartrate (LOPRESSOR) 50 MG tablet Take 50 mg by mouth 2 (two) times daily.   montelukast (SINGULAIR) 10 MG tablet Take 10 mg by mouth daily.   Multiple Vitamin  (MULTIVITAMIN WITH MINERALS) TABS tablet Take 1 tablet by mouth daily.   Nebulizers (SIDESTREAM NEBULIZER-DISP) MISC Use with nebulized medications as directed. Replace tubing every 2 weeks.   olmesartan-hydrochlorothiazide (BENICAR HCT) 40-12.5 MG tablet Take 1 tablet by mouth daily.   ondansetron (ZOFRAN) 4  MG tablet Take 1 tablet (4 mg total) by mouth every 8 (eight) hours as needed for nausea or vomiting.   potassium chloride SA (K-DUR,KLOR-CON) 20 MEQ tablet Take 20 mEq by mouth daily as needed (With lasix).    pravastatin (PRAVACHOL) 20 MG tablet Take 20 mg by mouth daily.   PRESCRIPTION MEDICATION Inhale into the lungs at bedtime. CPAP   Vitamin D, Ergocalciferol, (DRISDOL) 50000 UNITS CAPS Take 50,000 Units by mouth every Monday.    [DISCONTINUED] albuterol (VENTOLIN HFA) 108 (90 Base) MCG/ACT inhaler Inhale 2 puffs into the lungs every 6 (six) hours as needed for wheezing or shortness of breath.    [DISCONTINUED] budesonide (PULMICORT) 0.5 MG/2ML nebulizer solution Inhale one vial in nebulizer twice a day. Rinse mouth after use. Generic for Pulmicort.   [DISCONTINUED] EPINEPHrine (EPIPEN 2-PAK) 0.3 mg/0.3 mL IJ SOAJ injection Inject 0.3 mg into the muscle as needed for anaphylaxis. Call 9-1-1 after use   [DISCONTINUED] formoterol (PERFOROMIST) 20 MCG/2ML nebulizer solution Inhale one vial in nebulizer twice a day.   [DISCONTINUED] ipratropium (ATROVENT) 0.02 % nebulizer solution Take 0.5 mg by nebulization every 4 (four) hours as needed for wheezing or shortness of breath.   [DISCONTINUED] Mepolizumab (NUCALA) 100 MG/ML SOAJ Inject 1 mL (100 mg total) into the skin every 28 (twenty-eight) days.   albuterol (VENTOLIN HFA) 108 (90 Base) MCG/ACT inhaler Inhale 2 puffs into the lungs every 6 (six) hours as needed for wheezing or shortness of breath.   budesonide (PULMICORT) 0.5 MG/2ML nebulizer solution Inhale one vial in nebulizer twice a day. Rinse mouth after use. Generic for Pulmicort.    EPINEPHrine (EPIPEN 2-PAK) 0.3 mg/0.3 mL IJ SOAJ injection Inject 0.3 mg into the muscle as needed for anaphylaxis. Call 9-1-1 after use   formoterol (PERFOROMIST) 20 MCG/2ML nebulizer solution Inhale one vial in nebulizer twice a day.   ipratropium (ATROVENT) 0.02 % nebulizer solution Take 2.5 mLs (0.5 mg total) by nebulization every 4 (four) hours as needed for wheezing or shortness of breath.   Mepolizumab (NUCALA) 100 MG/ML SOAJ Inject 1 mL (100 mg total) into the skin every 28 (twenty-eight) days.   No facility-administered encounter medications on file as of 03/12/2022.     Review of Systems  Review of Systems  n/a Physical Exam  BP 126/74 (BP Location: Right Arm, Patient Position: Sitting, Cuff Size: Normal)   Pulse 70   Temp 98.6 F (37 C) (Oral)   Ht 5' (1.524 m)   SpO2 97%   BMI 52.73 kg/m   Wt Readings from Last 5 Encounters:  06/11/21 270 lb (122.5 kg)  06/01/21 266 lb (120.7 kg)  10/29/20 260 lb 1.6 oz (118 kg)  10/22/20 264 lb 9.6 oz (120 kg)  05/22/19 267 lb (121.1 kg)    BMI Readings from Last 5 Encounters:  03/12/22 52.73 kg/m  06/11/21 51.02 kg/m  06/01/21 50.26 kg/m  10/29/20 49.15 kg/m  10/22/20 50.00 kg/m     Physical Exam General: Well-appearing, no acute distress Eyes: EOMI, no icterus Neck: Supple, no JVP Pulmonary: Lungs clear, no wheezes, normal work of breathing Cardiovascular: Regular rate and rhythm, no murmurs Neuro: Seated in wheelchair, no focal weakness, sensation intact   Assessment & Plan:   Dyspnea exertion: Likely multifactorial related to deconditioning, obesity, concern for uncontrolled asthma given multiple prednisone courses with mild improvement each time, possible other cardiac etiologies.  Echocardiogram reassuring.  Improved with Nucala.  Asthma: Worsening dyspnea on exertion, shortness of breath, cough the early 2022.  Exacerbations through the spring.  Adherent to ICS/LABA nebulizer.  Despite this, ongoing  symptoms.  Nucala started summer 2022.  Resolution of cough, greatly improved dyspnea on exertion.  To continue Nucala and nebulized therapies ICS/LABA/LAMA.  All refilled today.  Chronic cough: Suspect postnasal drip although she denies significant symptoms.  Productive of phlegm in the morning.  Not a huge issue.  Recent yellow discoloration.  Not too worried.  Developed sinus headache or dyspnea let us know and may be indication for antibiotics.   Return in about 6 months (around 09/12/2022).   Lanier Clam, MD 03/12/2022

## 2022-03-12 NOTE — Patient Instructions (Signed)
Nice to see you again  No changes to medications  If the cough worsens with any chest pain or shortness of breath let us know.  If the cough worsens with any headache or sinus pain let us know.  If things are getting better the next day or 2 I will assume that things are fine.  Return to clinic in 6 months or sooner if needed with Dr. Silas Flood 8

## 2022-03-17 NOTE — Telephone Encounter (Signed)
Pt seen 8/11. Closing encounter.

## 2022-03-19 ENCOUNTER — Ambulatory Visit (HOSPITAL_COMMUNITY)
Admission: RE | Admit: 2022-03-19 | Discharge: 2022-03-19 | Disposition: A | Discharge status: Home / Self Care | Payer: Medicare Other | Source: Ambulatory Visit | Attending: Internal Medicine | Admitting: Internal Medicine

## 2022-03-19 DIAGNOSIS — M25562 Pain in left knee: Secondary | ICD-10-CM | POA: Diagnosis not present

## 2022-03-19 MED ORDER — IOHEXOL 180 MG/ML  SOLN
10.0000 mL | Freq: Once | INTRAMUSCULAR | Status: AC | PRN
  Administered 2022-03-19: 5 mL via INTRA_ARTICULAR

## 2022-03-19 MED ORDER — METHYLPREDNISOLONE ACETATE 80 MG/ML IJ SUSP
INTRAMUSCULAR | Status: AC
  Filled 2022-03-19: qty 1

## 2022-03-19 MED ORDER — BUPIVACAINE HCL (PF) 0.25 % IJ SOLN
5.0000 mL | Freq: Once | INTRAMUSCULAR | Status: AC
  Administered 2022-03-19: 10 mL

## 2022-03-19 MED ORDER — METHYLPREDNISOLONE ACETATE 40 MG/ML INJ SUSP (RADIOLOG
80.0000 mg | Freq: Once | INTRAMUSCULAR | Status: AC
  Administered 2022-03-19: 80 mg via INTRA_ARTICULAR

## 2022-03-19 MED ORDER — LIDOCAINE HCL (PF) 1 % IJ SOLN
5.0000 mL | Freq: Once | INTRAMUSCULAR | Status: AC
  Administered 2022-03-19: 5 mL via INTRADERMAL

## 2022-03-29 ENCOUNTER — Other Ambulatory Visit (HOSPITAL_COMMUNITY): Payer: Self-pay

## 2022-03-29 ENCOUNTER — Telehealth: Payer: Self-pay

## 2022-03-29 NOTE — Telephone Encounter (Signed)
Patient Advocate Encounter   Prior authorization is required for Formoterol Fumarate 20MCG/2ML  Submitted: 03-29-2022 Key [id number]  Bedelia Person, CphT

## 2022-03-29 NOTE — Telephone Encounter (Signed)
Patient Advocate Encounter   Prior authorization is required for Budesonide 0.'5mg'$ /22m  Submitted: 03-29-2022 Key [id number]  ABedelia Person CphT

## 2022-03-30 ENCOUNTER — Other Ambulatory Visit (HOSPITAL_COMMUNITY): Payer: Self-pay

## 2022-03-30 NOTE — Telephone Encounter (Signed)
Patient Advocate Encounter  Received a fax regarding Prior Authorization for Budesonide 0.5 mg/59m AMPUL-NEB.   Authorization has been DENIED due to [place] that a drug prescribed to a  Part D eligible individual cannot be considered a covered Part D drug if payment for the drug is or would  be available under Medicare Part A or Part B. Medicare covers drugs that use durable medical  equipment, such as drugs given by a nebulizer, under the Part B benefit and not the Part D benefit. This  request was denied under your Medicare Part D benefit; however, it may be covered under Medicare Part  A or Part B. For more information, talk to your prescriber or call 1- 800-MEDICARE.  Please send all nebs to DME pharmacy.

## 2022-03-30 NOTE — Telephone Encounter (Signed)
Patient Advocate Encounter  Received a fax regarding Prior Authorization for Formoterol fumarate 20 mcg/65m VIAL-NEB.   Authorization has been DENIED due to The [place] that a drug prescribed to a  Part D eligible individual cannot be considered a covered Part D drug if payment for the drug is or would  be available under Medicare Part A or Part B. Medicare covers drugs that use durable medical  equipment, such as drugs given by a nebulizer, under the Part B benefit and not the Part D benefit. This  request was denied under your Medicare Part D benefit; however, it may be covered under Medicare Part  A or Part B. For more information, talk to your prescriber or call 1- 800-MEDICARE.  Please send all nebs to a DME pharmacy.

## 2022-04-16 ENCOUNTER — Other Ambulatory Visit (HOSPITAL_COMMUNITY): Payer: Self-pay

## 2022-04-20 ENCOUNTER — Other Ambulatory Visit (HOSPITAL_COMMUNITY): Payer: Self-pay

## 2022-05-10 DIAGNOSIS — N1831 Chronic kidney disease, stage 3a: Secondary | ICD-10-CM | POA: Diagnosis not present

## 2022-05-10 DIAGNOSIS — R7989 Other specified abnormal findings of blood chemistry: Secondary | ICD-10-CM | POA: Diagnosis not present

## 2022-05-10 DIAGNOSIS — R7301 Impaired fasting glucose: Secondary | ICD-10-CM | POA: Diagnosis not present

## 2022-05-10 DIAGNOSIS — E785 Hyperlipidemia, unspecified: Secondary | ICD-10-CM | POA: Diagnosis not present

## 2022-05-10 DIAGNOSIS — I10 Essential (primary) hypertension: Secondary | ICD-10-CM | POA: Diagnosis not present

## 2022-05-17 DIAGNOSIS — N1831 Chronic kidney disease, stage 3a: Secondary | ICD-10-CM | POA: Diagnosis not present

## 2022-05-17 DIAGNOSIS — Z1339 Encounter for screening examination for other mental health and behavioral disorders: Secondary | ICD-10-CM | POA: Diagnosis not present

## 2022-05-17 DIAGNOSIS — G4733 Obstructive sleep apnea (adult) (pediatric): Secondary | ICD-10-CM | POA: Diagnosis not present

## 2022-05-17 DIAGNOSIS — M25562 Pain in left knee: Secondary | ICD-10-CM | POA: Diagnosis not present

## 2022-05-17 DIAGNOSIS — Z0000 Encounter for general adult medical examination without abnormal findings: Secondary | ICD-10-CM | POA: Diagnosis not present

## 2022-05-17 DIAGNOSIS — J4540 Moderate persistent asthma, uncomplicated: Secondary | ICD-10-CM | POA: Diagnosis not present

## 2022-05-17 DIAGNOSIS — Z1331 Encounter for screening for depression: Secondary | ICD-10-CM | POA: Diagnosis not present

## 2022-05-17 DIAGNOSIS — E785 Hyperlipidemia, unspecified: Secondary | ICD-10-CM | POA: Diagnosis not present

## 2022-05-17 DIAGNOSIS — I129 Hypertensive chronic kidney disease with stage 1 through stage 4 chronic kidney disease, or unspecified chronic kidney disease: Secondary | ICD-10-CM | POA: Diagnosis not present

## 2022-05-17 DIAGNOSIS — I700 Atherosclerosis of aorta: Secondary | ICD-10-CM | POA: Diagnosis not present

## 2022-05-26 DIAGNOSIS — H903 Sensorineural hearing loss, bilateral: Secondary | ICD-10-CM | POA: Diagnosis not present

## 2022-05-27 NOTE — Progress Notes (Signed)
PATIENT: [name] DOB: [birth year]  REASON FOR VISIT: follow up HISTORY FROM: patient  Chief Complaint  Patient presents with   Follow-up    Pt with daughter rm 1. Overall stable. Cpap working well. [facility]      HISTORY OF PRESENT ILLNESS:  06/02/22 ALL:  [name] returns for follow up for OSA on CPAP. She reports . She is using CPAP nightly for about 8-9 hours. Leak as improved. AHI is improved from last visit but remains slightly elevated at 7.8/h.       06/01/2021 ALL: [name] is a 81 y.o. female here today for follow up for OSA on CPAP. She reports doing well on CPAP. She continues to note a significant air leak. She reports head gear is stretched and Velcro does not attach. She has had trouble with leak for several years. AHI is now elevated at 9/hr.       01/24/19 ALL (Mychart): [name] is a 81 y.o. 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  She reports that she is doing very well with CPAP therapy.  She states that she cannot sleep without her machine.  Compliance data dated 12/24/2018 through 01/22/2019 reveals that she is using her machine every day for compliance of 100%.  Every day she used her machine greater than 4 hours.  Average usage was 7 hours and 53 minutes.  AHI was elevated at 7.1 on 4 to 20 cm of water and an EPR of 1.  Review of pressures she is frequently reaching 20 cm of water. She is here today for follow-up.   History (copied from Dr Dohmeier's note on 04/27/2018)   HPI:  [name] is a 81 y.o. female , seen here as in a referral from Dr. Philip Aspen to re-establish sleep care. The patient had been referred by Dr Leonie Man in January 2013, with a BMI of 46 /h underwent on 10-13-2011 PSG with Capnography, resulting in an AHI of 32/ h, RDI of 38/h and CO2 retention. 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 This was followed by CPAP titration on 11-02-2011, to a final 11 cm water  with REM sleep rebound. She has used her original CPAP machine ever since.   In July 2018 the patient was admitted to [facility] after she developed an ulcerative bleed in the upper GI tract.  During the hospitalization she was provided positive airway pressure with a full facemask.  She urgently needs supplies as this mask is not well fitting, she is noting that air leaks, and that she has also noted that her humidifier chamber is empty in the morning.  This is not to be the case when she started the CPAP therapy her machine was issued in the year 2013 and she is definitely due for a new one.  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 Leonie Man in the stroke service which led to her referral for the sleep study.  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    Lives in [place], [place] patient has been a compliant CPAP user and her compliance report for the last  30 days shows 97% compliance with an average time of use of 6 hours and 43 minutes.  Her CPAP is an S9 Elite machine it is set at 11 cmH2O was 1 cm EPR, residual AHI is 3.8 and the air leaks are horrendous.  She is having a median air leak of 69.5 L/min.   Chief complaint according to patient : need a new machine - leaking mask and machine loses water.    Sleep habits are as follows: dinner is around 7.30PM. Afterwards, she reads on an I pad, talks to friends and relatives long distance. Crafts ( making wreaths). She goes to bed at 10-10.30 , she sleeps in a non- adjustable bed. She sleeps on 3-4  Pillows, reclined on her back.  She stays asleep until 2 AM to go to the toilet. She has  sometimes a second break around 4 AM. She dreams- but she is not refreshed, restored when waking up at 5.45 AM , rises not earlier than 6.45 AM. She has a dry mouth but no headaches.    Sleep medical history and family sleep history:  No other family member has a diagnosis of OSA.    Social history:  Lives alone, widowed since 3/ 2017. 5 adult children, 4 live close.  No smoker, non ETOH use,   She drinks daily iced tea- [employer]. no coffee, some sodas- she cut down 6 month ago, until then 4-6 sodas a day.  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   REVIEW OF SYSTEMS: Out of a complete 14 system review of symptoms, the patient complains only of the following symptoms, right knee pain and all other reviewed systems are negative.  ESS: 3   ALLERGIES: No Known Allergies  HOME MEDICATIONS: Outpatient Medications Prior to Visit  Medication Sig Dispense Refill   acetaminophen (TYLENOL) 500 MG tablet Take 1,000 mg by mouth 2 (two) times daily.     albuterol (VENTOLIN HFA) 108 (90 Base) MCG/ACT inhaler Inhale 2 puffs into the lungs every 6 (six) hours as needed for wheezing or shortness of breath. 18 g 6   amLODipine (NORVASC) 5 MG tablet Take 5 mg by mouth daily.     DULoxetine (CYMBALTA) 60 MG capsule Take 60 mg by mouth daily.     EPINEPHrine (EPIPEN 2-PAK) 0.3 mg/0.3 mL IJ SOAJ injection Inject 0.3 mg into the muscle as needed for anaphylaxis. Call 9-1-1 after use 1 each 2   formoterol (PERFOROMIST) 20 MCG/2ML nebulizer solution Inhale one vial in nebulizer twice a day. 60 mL 10   furosemide (LASIX) 40 MG tablet Take 40 mg by mouth daily as needed (for fluid retention.).      hydrochlorothiazide (MICROZIDE) 12.5 MG capsule Take 12.5 mg by mouth daily.     Mepolizumab (NUCALA) 100 MG/ML SOAJ Inject 1 mL (100 mg total) into the skin every 28 (twenty-eight) days. 1 mL 11   metoprolol tartrate (LOPRESSOR) 50 MG tablet Take 50 mg by mouth 2 (two) times daily.      montelukast (SINGULAIR) 10 MG tablet Take 10 mg by mouth daily.     Multiple Vitamin (MULTIVITAMIN WITH MINERALS) TABS tablet Take 1 tablet by mouth daily.     Nebulizers (SIDESTREAM NEBULIZER-DISP) MISC Use with nebulized medications as directed. Replace tubing every 2 weeks. 1 each 10   olmesartan (BENICAR) 40 MG tablet Take 40 mg by mouth daily.     ondansetron (ZOFRAN) 4 MG tablet Take 1 tablet (4 mg  total) by mouth every 8 (eight) hours as needed for nausea or vomiting. 12 tablet 0   pantoprazole (PROTONIX) 40 MG tablet TAKE 1 TABLET BY MOUTH TWICE A DAY for 90     potassium chloride SA (K-DUR,KLOR-CON) 20 MEQ tablet Take 20 mEq by mouth daily as needed (With lasix).      pravastatin (PRAVACHOL) 20 MG tablet Take 20 mg by mouth daily.     PRESCRIPTION MEDICATION Inhale into the lungs at bedtime. CPAP     traMADol (ULTRAM) 50 MG tablet TAKE 1 TABLET BY MOUTH UP TO 3 TIMES A DAY AS NEEDED FOR MODERATE PAIN Oral TID, PRN for 90 days     Vitamin D, Ergocalciferol, (DRISDOL) 50000 UNITS CAPS Take 50,000 Units by mouth every Monday.      ASPERCREME LIDOCAINE EX Apply 1 application topically 2 (two) times daily as needed (knee pain).      budesonide (PULMICORT) 0.5 MG/2ML nebulizer solution Inhale one vial in nebulizer twice a day. Rinse mouth after use. Generic for Pulmicort. (Patient not taking: Reported on 06/02/2022) 60 mL 10   benzonatate (TESSALON) 100 MG capsule Take 1 capsule (100 mg total) by mouth every 8 (eight) hours. 21 capsule 0   guaiFENesin (MUCINEX) 600 MG 12 hr tablet Take by mouth 2 (two) times daily as needed.     ipratropium (ATROVENT) 0.02 % nebulizer solution Take 2.5 mLs (0.5 mg total) by nebulization every 4 (four) hours as needed for wheezing or shortness of breath. 75 mL 5   Menthol, Topical Analgesic, 4 % GEL Apply 1 application topically 2 (two) times daily as needed (knee pain).      olmesartan-hydrochlorothiazide (BENICAR HCT) 40-12.5 MG tablet Take 1 tablet by mouth  daily.     No facility-administered medications prior to visit.    PAST MEDICAL HISTORY: Past Medical History:  Diagnosis Date   Anxiety    Arthritis    Asthma    hx of   Back pain    Chronic constipation    Depression    hx of   Dyspnea    Family history of adverse reaction to anesthesia    DAD bp ELEVATED    GERD (gastroesophageal reflux disease)    High cholesterol    Hypertension    Lymphoma ([place])    Obesity    Sleep apnea    uses a CPAP   Stroke ([place])    2010   SVT (supraventricular tachycardia)     PAST SURGICAL HISTORY: Past Surgical History:  Procedure Laterality Date   BACK SURGERY  2009   for lymphoma   BIOPSY  05/24/2019   Procedure: BIOPSY;  Surgeon: Doran Stabler, MD;  Location: [facility];  Service: Gastroenterology;;   BONE BIOPSY     CESAREAN SECTION     ESOPHAGOGASTRODUODENOSCOPY     ESOPHAGOGASTRODUODENOSCOPY (EGD) WITH PROPOFOL N/A 08/05/2017   Procedure: ESOPHAGOGASTRODUODENOSCOPY (EGD) WITH PROPOFOL;  Surgeon: Doran Stabler, MD;  Location: [facility];  Service: Gastroenterology;  Laterality: N/A;   ESOPHAGOGASTRODUODENOSCOPY (EGD) WITH PROPOFOL N/A 05/24/2019   Procedure: ESOPHAGOGASTRODUODENOSCOPY (EGD) WITH PROPOFOL;  Surgeon: Doran Stabler, MD;  Location: [facility];  Service: Gastroenterology;  Laterality: N/A;   EYE SURGERY     cataracts   LAPAROSCOPY N/A 02/01/2017   Procedure: LAPAROSCOPY DIAGNOSTIC;  Surgeon: Erroll Luna, MD;  Location: [place];  Service: General;  Laterality: N/A;   REPLACEMENT TOTAL KNEE     right   TOTAL ABDOMINAL HYSTERECTOMY  1973    FAMILY HISTORY: Family History  Problem Relation Age of Onset   Lymphoma Mother    Breast cancer Mother    Coronary artery disease Father 16       died   Stroke Father    Colon cancer Maternal Uncle     SOCIAL HISTORY: Social History   Socioeconomic History   Marital status: Widowed    Spouse name: Not on file   Number of children: 5   Years of  education: Not on file   Highest education level: Not on file  Occupational History   Occupation: Chemical engineer  Tobacco Use   Smoking status: Never   Smokeless tobacco: Never  Vaping Use   Vaping Use: Never used  Substance and Sexual Activity   Alcohol use: No   Drug use: No   Sexual activity: Not Currently  Other Topics Concern   Not on file  Social History Narrative   Not on file   Social Determinants of Health   Financial Resource Strain: Not on file  Food Insecurity: Not on file  Transportation Needs: Not on file  Physical Activity: Not on file  Stress: Not on file  Social Connections: Not on file  Intimate Partner Violence: Not on file     PHYSICAL EXAM  Vitals:   06/02/22 1509  BP: 124/72  Pulse: 87  Weight: 274 lb (124.3 kg)  Height: 5' (1.524 m)    Body mass index is 53.51 kg/m.  Generalized: Well developed, in no acute distress  Cardiology: normal rate and rhythm, no murmur noted Respiratory: clear to auscultation bilaterally  Neurological examination  Mentation: Alert oriented to time, place, history taking. Follows all commands speech and language fluent Cranial nerve II-XII: Pupils were equal round reactive to light. Extraocular movements were full, visual field were full  Motor: The motor testing reveals 5 over 5 strength of all 4 extremities. Good symmetric motor tone is noted throughout.  Gait and station: Gait is arthritic, walks with Rolator.    DIAGNOSTIC DATA (LABS, IMAGING, TESTING) - I reviewed patient records, labs, notes, testing and imaging myself where available.      No data to display           Lab Results  Component Value Date   WBC 6.9 07/20/2019   HGB 10.1 (L) 07/20/2019   HCT 34.8 (L) 07/20/2019   MCV 83.7 07/20/2019   PLT 354 07/20/2019      Component Value Date/Time   NA 136 07/20/2019 1621   K 3.7 07/20/2019 1621   CL 98 07/20/2019 1621   CO2 30 07/20/2019 1621   GLUCOSE 108 (H) 07/20/2019 1621   BUN 15  07/20/2019 1621   CREATININE 0.93 07/20/2019 1621   CALCIUM 8.8 (L) 07/20/2019 1621   PROT 5.1 (L) 03/31/2019 0302   ALBUMIN 1.9 (L) 03/31/2019 0302   AST 10 (L) 03/31/2019 0302   ALT 12 03/31/2019 0302   ALKPHOS 48 03/31/2019 0302   BILITOT 1.1 03/31/2019 0302   GFRNONAA 58 (L) 07/20/2019 1621   GFRAA >60 07/20/2019 1621   Lab Results  Component Value Date   CHOL 191 07/19/2011   HDL 35 (L) 07/19/2011   LDLCALC 111 (H) 07/19/2011   TRIG 223 (H) 07/19/2011   CHOLHDL 5.5 07/19/2011   Lab Results  Component Value Date   HGBA1C 6.2 (H) 07/19/2011   Lab Results  Component Value Date   VITAMINB12 404 06/12/2008   Lab Results  Component Value Date  TSH 2.704 02/07/2017     ASSESSMENT AND PLAN 81 y.o. year old female  has a past medical history of Anxiety, Arthritis, Asthma, Back pain, Chronic constipation, Depression, Dyspnea, Family history of adverse reaction to anesthesia, GERD (gastroesophageal reflux disease), High cholesterol, Hypertension, Lymphoma ([place]), Obesity, Sleep apnea, Stroke ([place]), and SVT (supraventricular tachycardia). here with     ICD-10-CM   1. OSA on CPAP  G47.33 For home use only DME continuous positive airway pressure (CPAP)      Waldemar Dickens Kozloski is doing fairly well on CPAP therapy. Compliance report reveals excellent compliance. She was encouraged to continue using CPAP nightly and for greater than 4 hours each night. AHI remains elevated. I have encouraged her to try to sleep on her side when she can. She Risks of untreated sleep apnea review and education materials provided. Healthy lifestyle habits encouraged. She will follow up in 1 year, sooner if needed. She verbalizes understanding and agreement with this plan.    Orders Placed This Encounter  Procedures   For home use only DME continuous positive airway pressure (CPAP)    Supplies Please reach out to patient's daughter, Dorthula Rue @ [phone]. Email: [email or web address] They reports  having difficulty ordering supplies. Used to get emails monthly but no longer receiving.    Order Specific Question:   Length of Need    Answer:   Lifetime    Order Specific Question:   Patient has OSA or probable OSA    Answer:   Yes    Order Specific Question:   Is the patient currently using CPAP in the home    Answer:   Yes    Order Specific Question:   Settings    Answer:   Other see comments    Order Specific Question:   CPAP supplies needed    Answer:   Mask, headgear, cushions, filters, heated tubing and water chamber      No orders of the defined types were placed in this encounter.      Debbora Presto, FNP-C 06/02/2022, 3:54 PM [facility] [address], [place], [place] [address] [phone]

## 2022-05-27 NOTE — Patient Instructions (Signed)
Please continue using your CPAP regularly. While your insurance requires that you use CPAP at least 4 hours each night on 70% of the nights, I recommend, that you not skip any nights and use it throughout the night if you can. Getting used to CPAP and staying with the treatment long term does take time and patience and discipline. Untreated obstructive sleep apnea when it is moderate to severe can have an adverse impact on cardiovascular health and raise her risk for heart disease, arrhythmias, hypertension, congestive heart failure, stroke and diabetes. Untreated obstructive sleep apnea causes sleep disruption, nonrestorative sleep, and sleep deprivation. This can have an impact on your day to day functioning and cause daytime sleepiness and impairment of cognitive function, memory loss, mood disturbance, and problems focussing. Using CPAP regularly can improve these symptoms.   Please try to avoid sleeping on you back when you can. We will ask DME to contact you for supplies.   Follow up with me in 1 year

## 2022-06-02 ENCOUNTER — Encounter: Payer: Self-pay | Admitting: Family Medicine

## 2022-06-02 ENCOUNTER — Ambulatory Visit (INDEPENDENT_AMBULATORY_CARE_PROVIDER_SITE_OTHER): Payer: Medicare Other | Admitting: Family Medicine

## 2022-06-02 VITALS — BP 124/72 | HR 87 | Ht 60.0 in | Wt 274.0 lb

## 2022-06-02 DIAGNOSIS — G4733 Obstructive sleep apnea (adult) (pediatric): Secondary | ICD-10-CM

## 2022-06-03 ENCOUNTER — Telehealth: Payer: Self-pay

## 2022-07-05 ENCOUNTER — Telehealth: Payer: Self-pay

## 2022-07-05 NOTE — Telephone Encounter (Signed)
Contacted pt's daughter and discussed  PAP re-enrollment for calendar year 2024. Pt requested renewal form be mailed to them. Address confirmed and placed in outgoing mailbox up front. Will await return of application.  Provider portion will be placed in Dr. Kavin Leech box for signature. Placed page 4 and copies of pt and provider portion in Awaiting response folder.

## 2022-07-19 NOTE — Telephone Encounter (Signed)
Patient's daughter called and had questions regarding [place] form that was mailed out. Please call back at [phone]

## 2022-07-27 NOTE — Telephone Encounter (Signed)
Returned call to patient's daughter, [name]. She asked if second optional signature line needed to be completed. Advised that it was not previously signed so should be fine to leave blank  She will complete and email back to pharmacy team (her email is [email or web address])  Knox Saliva, PharmD, MPH, BCPS, CPP Clinical Pharmacist (Rheumatology and Pulmonology)

## 2022-07-27 NOTE — Telephone Encounter (Signed)
Submitted Patient Assistance renewal Application to [facility] [place] for [place] along with provider portion, patient portion, insurance card copy and med listi. Will update patient when we receive a response.  Fax# [phone] Phone# [phone]  Knox Saliva, PharmD, MPH, BCPS, CPP Clinical Pharmacist (Rheumatology and Pulmonology)

## 2022-08-13 NOTE — Telephone Encounter (Signed)
Received a fax from  [place] regarding an approval for [employer] patient assistance from 08/13/2022 to 08/02/2023. Approval letter sent to scan center.

## 2022-08-17 ENCOUNTER — Telehealth: Payer: Self-pay | Admitting: Pulmonary Disease

## 2022-08-17 DIAGNOSIS — J4541 Moderate persistent asthma with (acute) exacerbation: Secondary | ICD-10-CM

## 2022-08-17 MED ORDER — FORMOTEROL FUMARATE 20 MCG/2ML IN NEBU: INHALATION_SOLUTION | RESPIRATORY_TRACT | 11 refills | Status: DC

## 2022-08-17 MED ORDER — FORMOTEROL FUMARATE 20 MCG/2ML IN NEBU: INHALATION_SOLUTION | RESPIRATORY_TRACT | 0 refills | Status: DC

## 2022-08-17 MED ORDER — BUDESONIDE 0.5 MG/2ML IN SUSP: RESPIRATORY_TRACT | 11 refills | Status: DC

## 2022-08-17 MED ORDER — BUDESONIDE 0.5 MG/2ML IN SUSP
RESPIRATORY_TRACT | 11 refills | Status: DC
Start: 2022-08-17 — End: 2022-09-03

## 2022-08-17 MED ORDER — BUDESONIDE 0.5 MG/2ML IN SUSP: RESPIRATORY_TRACT | 0 refills | Status: DC

## 2022-08-17 MED ORDER — FORMOTEROL FUMARATE 20 MCG/2ML IN NEBU
INHALATION_SOLUTION | RESPIRATORY_TRACT | 11 refills | Status: DC
Start: 2022-08-17 — End: 2022-08-17

## 2022-08-17 NOTE — Telephone Encounter (Signed)
[facility] is Pharm  Daughter calling again. Wanted me to add that mom changed her insurance. I put it in. [employer].  [phone] is her # to call and advise. TY

## 2022-08-17 NOTE — Telephone Encounter (Signed)
I spoke with the pt's daughter  She states pt just used her last pulmicort and performist tx  She is requesting refills be sent to [facility]  She was upset that pt will not be able to get med today  She asked for samples  I advised that unfortunately we do not have samples of those medications  I advised I could send to local pharmacy for her to see if she can get it today while waiting for mail order  [facility] that [facility]  I have sent to all three pharmacies as requested  Nothing further needed

## 2022-08-19 NOTE — Telephone Encounter (Signed)
Patient's daughter is calling again regarding the medication that was sent to [facility].  The pharmacy said there were no instructions sent fort he medication and daughter would like this sent to the pharmacy.  Please advise.  CB# [phone]

## 2022-09-01 ENCOUNTER — Telehealth: Payer: Self-pay | Admitting: Pulmonary Disease

## 2022-09-01 DIAGNOSIS — J4541 Moderate persistent asthma with (acute) exacerbation: Secondary | ICD-10-CM

## 2022-09-01 NOTE — Telephone Encounter (Signed)
Called and spoke to daughter and she is trying to find out if [facility] will cover medications. She states she is getting the run around with trying to call.  Can we try to determine if [facility] will cover the pulmicort and perforimist with her insurance.  Im at a total loss on what to do myself  Please advise

## 2022-09-03 ENCOUNTER — Telehealth: Payer: Self-pay

## 2022-09-03 DIAGNOSIS — J4541 Moderate persistent asthma with (acute) exacerbation: Secondary | ICD-10-CM

## 2022-09-03 MED ORDER — FORMOTEROL FUMARATE 20 MCG/2ML IN NEBU: INHALATION_SOLUTION | RESPIRATORY_TRACT | 11 refills | Status: DC

## 2022-09-03 MED ORDER — BUDESONIDE 0.5 MG/2ML IN SUSP: 0.5000 mg | Freq: Every day | RESPIRATORY_TRACT | 11 refills | Status: DC

## 2022-09-03 NOTE — Telephone Encounter (Signed)
Please call patients daughter to discuss refill of pulmicort and perforimist, Has [employer] insurance. PA team has not responded and patient needs medication. Triage team- please advise.

## 2022-09-03 NOTE — Telephone Encounter (Signed)
Spoke with [facility] who stated that they will fill budesonide and formoterol is requiring PA which pharmacy was requested to start by RN and once that was received [facility] would fill medication for March 2024. Meanwhile [facility] [place] who stated the order had been received but nebulizer solution would have to be ordered and wouldn't be available until Monday at a cost of 60.06 for both boxes. Daphne (DPR) was updated on situation. Daphne stated understanding. Nothing further needed at this time.

## 2022-09-03 NOTE — Telephone Encounter (Signed)
Called and spoke to daughter and she is very upset. I told her that I will call [facility] to see what the hold up is. I am filling out brand new form for medications. I sent in [facility] temp [facility] as requested. Will refax paperwork to [facility]. Nothing further needed

## 2022-09-03 NOTE — Telephone Encounter (Signed)
[facility] states a PA is needed for both Formoterol and Budesonide. Pharmacy can you please start PA process for both meds? Thank you

## 2022-09-08 ENCOUNTER — Telehealth: Payer: Self-pay

## 2022-09-08 ENCOUNTER — Other Ambulatory Visit (HOSPITAL_COMMUNITY): Payer: Self-pay

## 2022-09-08 NOTE — Telephone Encounter (Signed)
Can Try Breo 200 dose 1 puff daily - I can not recall if she has tried this and failed or had adverse reaction to [facility]. It is $47 a month. Not sure if PA for the nebulizer reduces the cost.

## 2022-09-08 NOTE — Telephone Encounter (Signed)
Patient Advocate Encounter  Prior Authorization for Perforomist 20MCG/2ML nebulizer solution has been approved through [employer].  Key [id number]     Effective: 09-08-2022 to 08-02-2023

## 2022-09-08 NOTE — Telephone Encounter (Signed)
Test claims return the following for ICS+LABA  Advair Diskus  $100 Breo  $47 Dulera  $100 Symbicort  $100  Prior Auths for both Perforomist and Pulmicort were submitted and approved today as well.

## 2022-09-08 NOTE — Telephone Encounter (Signed)
Patient Advocate Encounter  Prior Authorization for Pulmicort 0.'5MG'$ /2ML suspension has been approved through [employer].  Key [id number]      Effective: 09-08-2022 to 08-02-2023

## 2022-09-08 NOTE — Telephone Encounter (Signed)
Daphne daughter states patient does not want new medication. Would like both nebulizer medication to be filled. Daphne phone number is [phone].

## 2022-09-08 NOTE — Telephone Encounter (Signed)
Patient Advocate Encounter   Received notification from [employer] that prior authorization is required for Perforomist 20MCG/2ML nebulizer solution  Submitted: 09-08-2022 Key [id number]   Status is pending

## 2022-09-08 NOTE — Telephone Encounter (Signed)
Patient Advocate Encounter   Received notification from [place] Medicare that prior authorization is required for Pulmicort 0.'5MG'$ /2ML suspension  Submitted: 09-08-2022 Key [id number]   Status is pending

## 2022-09-08 NOTE — Telephone Encounter (Signed)
ATC LVMTCB x 1  

## 2022-09-08 NOTE — Telephone Encounter (Signed)
The Breo would replace the perforomist and pulmicort.   I suspect that cough is related to being without the nebulizers (perforomist and pulmicort).   Ok to have both but would not advise using both at the same time.

## 2022-09-08 NOTE — Telephone Encounter (Signed)
We can try ICS/LABA inhaler - believe the nebulizer was more effective in past but can try again.   What is most cost effective ICS/LABA inhaler?

## 2022-09-08 NOTE — Telephone Encounter (Signed)
Daughter is wanting to know if [facility] will replace both Perforomist and Pulmicort? She is wanting to know what both inhalers do Please advise. Daughter is concerned without having both inhalers. Daughter states pt is starting to cough more since not using the inhalers not sure if this related or if she is starting to getting a cold. Patient would like to stay with both medications if possible.  Dr. Silas Flood can you please advise?

## 2022-09-08 NOTE — Telephone Encounter (Signed)
Daphne (DPR) returned call to office. Pt is still unable to get Pulmicort and Preformist. [facility] states that meds would cost 125.00 per month and [facility] saying that [facility] not [facility] the the Preformist nebulizer in house. The [facility]. Both of these meds are considered tier 4 drugs under pt's insurance. Daphne would like to know if their are any treatment alternatives that may be more affordable for pt. Dr. Silas Flood please advise.

## 2022-09-09 NOTE — Telephone Encounter (Signed)
Called Daphne but she did not answer. Left message for her to call us back.

## 2022-09-10 ENCOUNTER — Telehealth: Payer: Self-pay | Admitting: Pulmonary Disease

## 2022-09-10 MED ORDER — FORMOTEROL FUMARATE 20 MCG/2ML IN NEBU: 20.0000 ug | INHALATION_SOLUTION | Freq: Two times a day (BID) | RESPIRATORY_TRACT | 11 refills | Status: DC

## 2022-09-10 MED ORDER — BUDESONIDE 0.5 MG/2ML IN SUSP: 0.5000 mg | Freq: Two times a day (BID) | RESPIRATORY_TRACT | 11 refills | Status: DC

## 2022-09-10 NOTE — Telephone Encounter (Signed)
ATC LMTCB x1 09/10/22 @ 4:05 PM

## 2022-09-10 NOTE — Telephone Encounter (Signed)
Called and spoke with pt's daughter [name] to clarify that pt does want to stay on the nebulizer medications that she had been on and [name] stated that that was correct. Asked [name] which pharmacy she wanted [place] to send the neb solutions to for pt and she told me that the pharmacy that they usually use ([facility]) did not have the solutions in stock.  I asked [name] if she had a different pharmacy that she wanted [place] to send the meds to and she told me that a the nurse that she spoke to on 2/7 told her that she was going to call around to different pharmacies until she was able to find one that had the medication in stock for pt.  I stated to [facility] that anytime a medication is needing to be sent to a different pharmacy, we ask for either the pt or family to call around to different pharmacies to see if they have the medication in stock and stated to her after they found a pharmacy that had the meds in stock to then call us back and we would then send the meds to that new pharmacy.  I stated to [facility] again that after she found a different pharmacy for [place] to send the meds to for pt to give [place] a call back and we could then get the meds sent to that pharmacy for pt and [name] did not like me saying that comment to her. She then demanded to speak to the nurse that she spoke to on 2/7 that she said told her that she was going to call around to different pharmacies and I told her that that nurse was unavailable. I read to her the documentation from the call that she had with the nurse on 2/7 and she said that what the nurse had documented was not the correct conversation. [name] again demanded to speak to the nurse that she spoke with on 2/7 and demanded that that nurse call around to different pharmacies until she found one that had the meds in stock.  I again told [name] that we are not the ones that call around to different pharmacies until we found one that had certain meds in stock as that is what we ask  patient or family to do so we could then know which pharmacy we are needing to send meds to for our patients.    I spoke with [name] about the call and told her about what the daughter was demanding and she asked me if I could help our pt out to find a pharmacy that had the meds in stock. [facility] and was told that one solution was on backorder and they did not know when they were going to be getting it in stock and the other medication they were currently out of stock with as well. They said that no [facility] nearby had the one solution that was on backorder.  Called pt's local [facility] and was told that they had one solution in stock but not the other one. Asked if they could let me know which [facility] both [facility] in [facility] and was told [facility] [address] [facility] [address] [facility] both in stock. I made sure that both meds had all the correct instructions and that both meds were for a 30-day supply and also made sure that diagnosis code and all were on both prescriptions and sent both to the pharmacy for pt.  Called [name] back and  let her know what I had done and she verbalized understanding. Nothing further needed.

## 2022-09-13 NOTE — Telephone Encounter (Signed)
Called and spoke with [facility]. Daphne stated that her patient does not want to change medications, she wants to stick with what she knows. Daphne wants to know if she can get her mother's medicines sent somewhere local for now. Daphne also stated that [facility] needed a prior approval to get her mother's medication.   MH, please advise.

## 2022-09-13 NOTE — Telephone Encounter (Signed)
I believe the prior auth was approved. Ok to send wherever she prefers.

## 2022-09-14 NOTE — Telephone Encounter (Signed)
Call daughter back and she states that she got her mothers medication with [facility]. Nothing furhter needed

## 2022-09-29 DIAGNOSIS — G4733 Obstructive sleep apnea (adult) (pediatric): Secondary | ICD-10-CM | POA: Diagnosis not present

## 2022-09-30 ENCOUNTER — Encounter: Payer: Self-pay | Admitting: Pulmonary Disease

## 2022-09-30 ENCOUNTER — Ambulatory Visit (INDEPENDENT_AMBULATORY_CARE_PROVIDER_SITE_OTHER): Payer: HMO | Admitting: Pulmonary Disease

## 2022-09-30 VITALS — BP 128/70 | HR 67 | Wt 274.0 lb

## 2022-09-30 DIAGNOSIS — J4540 Moderate persistent asthma, uncomplicated: Secondary | ICD-10-CM | POA: Diagnosis not present

## 2022-09-30 NOTE — Progress Notes (Signed)
$'@Patient'v$  ID: [name], female    DOB: 1941/05/25, 82 y.o.   MRN: [id number]  Chief Complaint  Patient presents with   Follow-up    Follow up for asthma. Pt is still on the Nucala shots. Daughter states she was off of the nebs for about 3-4 weeks but she is now back on her Pulmicort and Performist nebs. She states her breathing is getting back to being better.     Referring provider: Donnajean Lopes, MD  HPI:   82 y.o. 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 Started [facility] Summer 2022.   Doing well. Last seen ~6 months ago.  Continues on Nucala.  On triple inhaled therapy via budesonide, arformoterol, Atrovent nebulized.  Symptoms well controlled.  Appropriate for nebulized medicines labs early 2024.  Was without her nebulizer solution for about 3 weeks.  Symptoms were worse on this.  With resumption of nebulized therapies symptoms improved.  At her baseline.  HPI at initial visit:  Patient contracted Covid 07/2019.  She does stay at home.  She got monoclonal antibody infusion.  Since then, she has had about every month exacerbation of her underlying with presumably asthma.  Wheeze, worsening dyspnea exertion, cough.  She has gotten a course of prednisone and antibiotics.  Been told she has pneumonia.  Presently based on chest x-rays that I cannot observe or view.    Cough is productive.  Green to yellowish phlegm.  Seems may be worse in the mornings and evenings.  No environmental or seasonal changes.  No other alleviating or exacerbating factors.  Dyspnea on exertion obviously worse when she exerts her self,, better with rest.  Has developed shortness of breath at rest as well.  No timing.  They were things are better or worse.  No alleviating or exacerbating factors.  She has been using Trelegy and does not think it helps.  Most recent chest x-ray 07/2019 reviewed which reveals clear lungs, streaky opacity left  base, heart size appears enlarged on my interpretation.   PMH: Asthma, sleep apnea, back pain Surgical history: C-section, knee replacement, hysterectomy, Family history: Mother with breast cancer, father with CAD, CVA   Questionaires / Pulmonary Flowsheets:   ACT:  Asthma Control Test ACT Total Score  03/12/2022  1:54 PM 23  12/22/2020  4:29 PM 10  10/22/2020  4:18 PM 7    MMRC: mMRC Dyspnea Scale mMRC Score  06/11/2021  4:19 PM 2    Epworth:      No data to display           Tests:   FENO:  No results found for: "NITRICOXIDE"  PFT:     No data to display           WALK:      No data to display           Imaging: Personally reviewed and as per EMR and discussion of this note No results found.  Lab Results: Personally reviewed CBC    Component Value Date/Time   WBC 6.9 07/20/2019 1621   RBC 4.16 07/20/2019 1621   HGB 10.1 (L) 07/20/2019 1621   HGB 11.2 (L) 11/18/2008 1132   HCT 34.8 (L) 07/20/2019 1621   HCT 33.6 (L) 11/18/2008 1132   PLT 354 07/20/2019 1621   PLT 235 11/18/2008 1132   MCV 83.7 07/20/2019 1621   MCV 91.2 11/18/2008 1132   MCH 24.3 (L)  07/20/2019 1621   MCHC 29.0 (L) 07/20/2019 1621   RDW 16.4 (H) 07/20/2019 1621   RDW 14.7 (H) 11/18/2008 1132   LYMPHSABS 1.5 07/20/2019 1621   LYMPHSABS 0.5 (L) 11/18/2008 1132   MONOABS 1.1 (H) 07/20/2019 1621   MONOABS 0.0 (L) 11/18/2008 1132   EOSABS 0.1 07/20/2019 1621   EOSABS 0.5 11/18/2008 1132   BASOSABS 0.1 07/20/2019 1621   BASOSABS 0.0 11/18/2008 1132    BMET    Component Value Date/Time   NA 136 07/20/2019 1621   K 3.7 07/20/2019 1621   CL 98 07/20/2019 1621   CO2 30 07/20/2019 1621   GLUCOSE 108 (H) 07/20/2019 1621   BUN 15 07/20/2019 1621   CREATININE 0.93 07/20/2019 1621   CALCIUM 8.8 (L) 07/20/2019 1621   GFRNONAA 58 (L) 07/20/2019 1621   GFRAA >60 07/20/2019 1621    BNP    Component Value Date/Time   BNP 74.2 01/31/2017 1655    ProBNP     Component Value Date/Time   PROBNP 240.0 (H) 06/18/2008 1657    Specialty Problems       Pulmonary Problems   OSA on CPAP   Sleeps in sitting position due to orthopnea   Moderate persistent asthma    No Known Allergies  Immunization History  Administered Date(s) Administered   Influenza Split 05/13/2011, 05/25/2012, 06/14/2013   Influenza,inj,quad, With Preservative 06/14/2013   Influenza-Unspecified 05/02/2017   Pneumococcal Polysaccharide-23 07/20/2011    Past Medical History:  Diagnosis Date   Anxiety    Arthritis    Asthma    hx of   Back pain    Chronic constipation    Depression    hx of   Dyspnea    Family history of adverse reaction to anesthesia    DAD bp ELEVATED    GERD (gastroesophageal reflux disease)    High cholesterol    Hypertension    Lymphoma ([place])    Obesity    Sleep apnea    uses a CPAP   Stroke ([place])    2010   SVT (supraventricular tachycardia)     Tobacco History: Social History   Tobacco Use  Smoking Status Never  Smokeless Tobacco Never   Counseling given: Not Answered   Continue to not smoke  Outpatient Encounter Medications as of 09/30/2022  Medication Sig   acetaminophen (TYLENOL) 500 MG tablet Take 1,000 mg by mouth 2 (two) times daily.   albuterol (VENTOLIN HFA) 108 (90 Base) MCG/ACT inhaler Inhale 2 puffs into the lungs every 6 (six) hours as needed for wheezing or shortness of breath.   amLODipine (NORVASC) 5 MG tablet Take 5 mg by mouth daily.   budesonide (PULMICORT) 0.5 MG/2ML nebulizer solution Take 2 mLs (0.5 mg total) by nebulization in the morning and at bedtime. Dx J45.40   DULoxetine (CYMBALTA) 60 MG capsule Take 60 mg by mouth daily.   EPINEPHrine (EPIPEN 2-PAK) 0.3 mg/0.3 mL IJ SOAJ injection Inject 0.3 mg into the muscle as needed for anaphylaxis. Call 9-1-1 after use   formoterol (PERFOROMIST) 20 MCG/2ML nebulizer solution Take 2 mLs (20 mcg total) by nebulization 2 (two) times daily.   furosemide  (LASIX) 40 MG tablet Take 40 mg by mouth daily as needed (for fluid retention.).    hydrochlorothiazide (MICROZIDE) 12.5 MG capsule Take 12.5 mg by mouth daily.   Mepolizumab (NUCALA) 100 MG/ML SOAJ Inject 1 mL (100 mg total) into the skin every 28 (twenty-eight) days.   metoprolol tartrate (LOPRESSOR) 50 MG  tablet Take 50 mg by mouth 2 (two) times daily.   montelukast (SINGULAIR) 10 MG tablet Take 10 mg by mouth daily.   Multiple Vitamin (MULTIVITAMIN WITH MINERALS) TABS tablet Take 1 tablet by mouth daily.   Nebulizers (SIDESTREAM NEBULIZER-DISP) MISC Use with nebulized medications as directed. Replace tubing every 2 weeks.   olmesartan (BENICAR) 40 MG tablet Take 40 mg by mouth daily.   ondansetron (ZOFRAN) 4 MG tablet Take 1 tablet (4 mg total) by mouth every 8 (eight) hours as needed for nausea or vomiting.   pantoprazole (PROTONIX) 40 MG tablet TAKE 1 TABLET BY MOUTH TWICE A DAY for 90   potassium chloride SA (K-DUR,KLOR-CON) 20 MEQ tablet Take 20 mEq by mouth daily as needed (With lasix).    pravastatin (PRAVACHOL) 20 MG tablet Take 20 mg by mouth daily.   PRESCRIPTION MEDICATION Inhale into the lungs at bedtime. CPAP   traMADol (ULTRAM) 50 MG tablet TAKE 1 TABLET BY MOUTH UP TO 3 TIMES A DAY AS NEEDED FOR MODERATE PAIN Oral TID, PRN for 90 days   Vitamin D, Ergocalciferol, (DRISDOL) 50000 UNITS CAPS Take 50,000 Units by mouth every Monday.    No facility-administered encounter medications on file as of 09/30/2022.     Review of Systems  Review of Systems  n/a Physical Exam  BP 128/70 (BP Location: Left Arm, Patient Position: Sitting, Cuff Size: Normal)   Pulse 67   Wt 274 lb (124.3 kg)   SpO2 96%   BMI 53.51 kg/m   Wt Readings from Last 5 Encounters:  09/30/22 274 lb (124.3 kg)  06/02/22 274 lb (124.3 kg)  06/11/21 270 lb (122.5 kg)  06/01/21 266 lb (120.7 kg)  10/29/20 260 lb 1.6 oz (118 kg)    BMI Readings from Last 5 Encounters:  09/30/22 53.51 kg/m  06/02/22  53.51 kg/m  03/12/22 52.73 kg/m  06/11/21 51.02 kg/m  06/01/21 50.26 kg/m     Physical Exam General: Well-appearing, no acute distress Eyes: EOMI, no icterus Neck: Supple, no JVP Pulmonary: Lungs clear, no wheezes, normal work of breathing Cardiovascular: Regular rate and rhythm, no murmurs Neuro: Seated in wheelchair, no focal weakness, sensation intact   Assessment & Plan:   Dyspnea exertion: Likely multifactorial related to deconditioning, obesity, concern for uncontrolled asthma given multiple prednisone courses with mild improvement each time, possible other cardiac etiologies.  Echocardiogram reassuring.  Improved with Nucala.  Asthma: Worsening dyspnea on exertion, shortness of breath, cough the early 2022.  Exacerbations through the spring.  Adherent to ICS/LABA nebulizer.  Despite this, ongoing symptoms.  Nucala started summer 2022.  Resolution of cough, greatly improved dyspnea on exertion.  To continue Nucala and nebulized therapies ICS/LABA.   Return in about 6 months (around 03/31/2023).   Lanier Clam, MD 09/30/2022

## 2022-09-30 NOTE — Patient Instructions (Signed)
Nice to see you again  Continue with nebulizers and Nucala  No changes to medications  Return to clinic in 6 months or sooner as needed with Dr. Silas Flood

## 2022-10-22 DIAGNOSIS — Z1231 Encounter for screening mammogram for malignant neoplasm of breast: Secondary | ICD-10-CM | POA: Diagnosis not present

## 2022-11-18 DIAGNOSIS — C8338 Diffuse large B-cell lymphoma, lymph nodes of multiple sites: Secondary | ICD-10-CM | POA: Diagnosis not present

## 2022-11-18 DIAGNOSIS — D509 Iron deficiency anemia, unspecified: Secondary | ICD-10-CM | POA: Diagnosis not present

## 2023-01-04 DIAGNOSIS — G4733 Obstructive sleep apnea (adult) (pediatric): Secondary | ICD-10-CM | POA: Diagnosis not present

## 2023-02-14 DIAGNOSIS — H26493 Other secondary cataract, bilateral: Secondary | ICD-10-CM | POA: Diagnosis not present

## 2023-02-14 DIAGNOSIS — H353111 Nonexudative age-related macular degeneration, right eye, early dry stage: Secondary | ICD-10-CM | POA: Diagnosis not present

## 2023-02-14 DIAGNOSIS — H35371 Puckering of macula, right eye: Secondary | ICD-10-CM | POA: Diagnosis not present

## 2023-02-14 DIAGNOSIS — H524 Presbyopia: Secondary | ICD-10-CM | POA: Diagnosis not present

## 2023-02-14 DIAGNOSIS — H40023 Open angle with borderline findings, high risk, bilateral: Secondary | ICD-10-CM | POA: Diagnosis not present

## 2023-04-06 DIAGNOSIS — G4733 Obstructive sleep apnea (adult) (pediatric): Secondary | ICD-10-CM | POA: Diagnosis not present

## 2023-05-04 ENCOUNTER — Ambulatory Visit: Payer: HMO | Admitting: Pulmonary Disease

## 2023-05-30 ENCOUNTER — Other Ambulatory Visit: Payer: Self-pay | Admitting: Pulmonary Disease

## 2023-06-06 NOTE — Progress Notes (Unsigned)
PATIENT: [name] DOB: 1941-05-08  REASON FOR VISIT: follow up HISTORY FROM: patient  No chief complaint on file.    HISTORY OF PRESENT ILLNESS:  06/06/23 ALL:  [name] returns for follow up for OSA on CPAP. She continues to do well on therapy. She is using CPAP nightly for about 8 hours, on average. She is eligible for a new CPAP.     06/02/2022 ALL:  [name] returns for follow up for OSA on CPAP. She is using CPAP nightly for about 8-9 hours. Leak as improved. AHI is improved from last visit but remains slightly elevated at 7.8/h.       06/01/2021 ALL: [name] is a 82 y.o. female here today for follow up for OSA on CPAP. She reports doing well on CPAP. She continues to note a significant air leak. She reports head gear is stretched and Velcro does not attach. She has had trouble with leak for several years. AHI is now elevated at 9/hr.       01/24/19 ALL (Mychart): TASHE Richardson is a 82 y.o. 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  She reports that she is doing very well with CPAP therapy.  She states that she cannot sleep without her machine.  Compliance data dated 12/24/2018 through 01/22/2019 reveals that she is using her machine every day for compliance of 100%.  Every day she used her machine greater than 4 hours.  Average usage was 7 hours and 53 minutes.  AHI was elevated at 7.1 on 4 to 20 cm of water and an EPR of 1.  Review of pressures she is frequently reaching 20 cm of water. She is here today for follow-up.   History (copied from Dr Dohmeier's note on 04/27/2018)   HPI:  [name] is a 82 y.o. female , seen here as in a referral from Dr. Eloise Harman to re-establish sleep care. The patient had been referred by Dr Pearlean Brownie in January 2013, with a BMI of 46 /h underwent on 10-13-2011 PSG with Capnography, resulting in an AHI of 32/ h, RDI of 38/h and CO2 retention. 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  This was followed by CPAP titration on 11-02-2011, to a final 11 cm water with REM sleep rebound. She has used her original CPAP machine ever since.   In July 2018 the patient was admitted to [facility] after she developed an ulcerative bleed in the upper GI tract.  During the hospitalization she was provided positive airway pressure with a full facemask.  She urgently needs supplies as this mask is not well fitting, she is noting that air leaks, and that she has also noted that her humidifier chamber is empty in the morning.  This is not to be the case when she started the CPAP therapy her machine was issued in the year 2013 and she is definitely due for a new one.  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 Pearlean Brownie in the stroke service which led to her referral for the sleep study.  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    Lives  in [place], [place] patient has been a compliant CPAP user and her compliance report for the last 30 days shows 97% compliance with an average time of use of 6 hours and 43 minutes.  Her CPAP is an S9 Elite machine it is set at 11 cmH2O was 1 cm EPR, residual AHI is 3.8 and the air leaks are horrendous.  She is having a median air leak of 69.5 L/min.   Chief complaint according to patient : need a new machine - leaking mask and machine loses water.    Sleep habits are as follows: dinner is around 7.30PM. Afterwards, she reads on an I pad, talks to friends and relatives long distance. Crafts ( making wreaths). She goes to bed at 10-10.30 , she sleeps in a non- adjustable bed. She sleeps on 3-4  Pillows, reclined on  her back.  She stays asleep until 2 AM to go to the toilet. She has sometimes a second break around 4 AM. She dreams- but she is not refreshed, restored when waking up at 5.45 AM , rises not earlier than 6.45 AM. She has a dry mouth but no headaches.    Sleep medical history and family sleep history:  No other family member has a diagnosis of OSA.    Social history:  Lives alone, widowed since 3/ 2017. 5 adult children, 4 live close.  No smoker, non ETOH use,   She drinks daily iced tea- [employer]. no coffee, some sodas- she cut down 6 month ago, until then 4-6 sodas a day.  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   REVIEW OF SYSTEMS: Out of a complete 14 system review of symptoms, the patient complains only of the following symptoms, right knee pain and all other reviewed systems are negative.  ESS: 3   ALLERGIES: No Known Allergies  HOME MEDICATIONS: Outpatient Medications Prior to Visit  Medication Sig Dispense Refill   acetaminophen (TYLENOL) 500 MG tablet Take 1,000 mg by mouth 2 (two) times daily.     albuterol (VENTOLIN HFA) 108 (90 Base) MCG/ACT inhaler TAKE 2 PUFFS BY MOUTH EVERY 6 HOURS AS NEEDED FOR WHEEZE OR SHORTNESS OF BREATH 36 each 0   amLODipine (NORVASC) 5 MG tablet Take 5 mg by mouth daily.     budesonide (PULMICORT) 0.5 MG/2ML nebulizer solution Take 2 mLs (0.5 mg total) by nebulization in the morning and at bedtime. Dx J45.40 120 mL 11   DULoxetine (CYMBALTA) 60 MG capsule Take 60 mg by mouth daily.     EPINEPHrine (EPIPEN 2-PAK) 0.3 mg/0.3 mL IJ SOAJ injection Inject 0.3 mg into the muscle as needed for anaphylaxis. Call 9-1-1 after use 1 each 2   formoterol (PERFOROMIST) 20 MCG/2ML nebulizer solution Take 2 mLs (20 mcg total) by nebulization 2 (two) times daily. 120 mL 11   furosemide (LASIX) 40 MG tablet Take 40 mg by mouth daily as needed (for fluid retention.).      hydrochlorothiazide (MICROZIDE) 12.5 MG capsule Take 12.5 mg by  mouth daily.     Mepolizumab (NUCALA) 100 MG/ML SOAJ Inject 1 mL (100 mg total) into the skin every 28 (twenty-eight) days. 1 mL 11   metoprolol tartrate (LOPRESSOR) 50 MG tablet Take 50 mg by mouth 2 (two) times daily.     montelukast (SINGULAIR) 10 MG tablet Take 10 mg by mouth daily.     Multiple Vitamin (MULTIVITAMIN WITH MINERALS) TABS tablet Take 1 tablet by mouth daily.  Nebulizers (SIDESTREAM NEBULIZER-DISP) MISC Use with nebulized medications as directed. Replace tubing every 2 weeks. 1 each 10   olmesartan (BENICAR) 40 MG tablet Take 40 mg by mouth daily.     ondansetron (ZOFRAN) 4 MG tablet Take 1 tablet (4 mg total) by mouth every 8 (eight) hours as needed for nausea or vomiting. 12 tablet 0   pantoprazole (PROTONIX) 40 MG tablet TAKE 1 TABLET BY MOUTH TWICE A DAY for 90     potassium chloride SA (K-DUR,KLOR-CON) 20 MEQ tablet Take 20 mEq by mouth daily as needed (With lasix).      pravastatin (PRAVACHOL) 20 MG tablet Take 20 mg by mouth daily.     PRESCRIPTION MEDICATION Inhale into the lungs at bedtime. CPAP     traMADol (ULTRAM) 50 MG tablet TAKE 1 TABLET BY MOUTH UP TO 3 TIMES A DAY AS NEEDED FOR MODERATE PAIN Oral TID, PRN for 90 days     Vitamin D, Ergocalciferol, (DRISDOL) 50000 UNITS CAPS Take 50,000 Units by mouth every Monday.      No facility-administered medications prior to visit.    PAST MEDICAL HISTORY: Past Medical History:  Diagnosis Date   Anxiety    Arthritis    Asthma    hx of   Back pain    Chronic constipation    Depression    hx of   Dyspnea    Family history of adverse reaction to anesthesia    DAD bp ELEVATED    GERD (gastroesophageal reflux disease)    High cholesterol    Hypertension    Lymphoma (HCC)    Obesity    Sleep apnea    uses a CPAP   Stroke (HCC)    2010   SVT (supraventricular tachycardia)     PAST SURGICAL HISTORY: Past Surgical History:  Procedure Laterality Date   BACK SURGERY  2009   for lymphoma   BIOPSY   05/24/2019   Procedure: BIOPSY;  Surgeon: Sherrilyn Rist, MD;  Location: [facility];  Service: Gastroenterology;;   BONE BIOPSY     CESAREAN SECTION     ESOPHAGOGASTRODUODENOSCOPY     ESOPHAGOGASTRODUODENOSCOPY (EGD) WITH PROPOFOL N/A 08/05/2017   Procedure: ESOPHAGOGASTRODUODENOSCOPY (EGD) WITH PROPOFOL;  Surgeon: Sherrilyn Rist, MD;  Location: [facility];  Service: Gastroenterology;  Laterality: N/A;   ESOPHAGOGASTRODUODENOSCOPY (EGD) WITH PROPOFOL N/A 05/24/2019   Procedure: ESOPHAGOGASTRODUODENOSCOPY (EGD) WITH PROPOFOL;  Surgeon: Sherrilyn Rist, MD;  Location: [facility];  Service: Gastroenterology;  Laterality: N/A;   EYE SURGERY     cataracts   LAPAROSCOPY N/A 02/01/2017   Procedure: LAPAROSCOPY DIAGNOSTIC;  Surgeon: Harriette Bouillon, MD;  Location: [facility];  Service: General;  Laterality: N/A;   REPLACEMENT TOTAL KNEE     right   TOTAL ABDOMINAL HYSTERECTOMY  1973    FAMILY HISTORY: Family History  Problem Relation Age of Onset   Lymphoma Mother    Breast cancer Mother    Coronary artery disease Father 58       died   Stroke Father    Colon cancer Maternal Uncle     SOCIAL HISTORY: Social History   Socioeconomic History   Marital status: Widowed    Spouse name: Not on file   Number of children: 5   Years of education: Not on file   Highest education level: Not on file  Occupational History   Occupation: daycare teacher  Tobacco Use   Smoking status: Never   Smokeless tobacco: Never  Vaping Use   Vaping status: Never Used  Substance and Sexual Activity   Alcohol use: No   Drug use: No   Sexual activity: Not Currently  Other Topics Concern   Not on file  Social History Narrative   Not on file   Social Determinants of Health   Financial Resource Strain: Not on file  Food Insecurity: Not on file  Transportation Needs: Not on file  Physical Activity: Not on file  Stress: Not on file  Social Connections: Not on file  Intimate Partner  Violence: Not on file     PHYSICAL EXAM  There were no vitals filed for this visit.   There is no height or weight on file to calculate BMI.  Generalized: Well developed, in no acute distress  Cardiology: normal rate and rhythm, no murmur noted Respiratory: clear to auscultation bilaterally  Neurological examination  Mentation: Alert oriented to time, place, history taking. Follows all commands speech and language fluent Cranial nerve II-XII: Pupils were equal round reactive to light. Extraocular movements were full, visual field were full  Motor: The motor testing reveals 5 over 5 strength of all 4 extremities. Good symmetric motor tone is noted throughout.  Gait and station: Gait is arthritic, walks with Rolator.    DIAGNOSTIC DATA (LABS, IMAGING, TESTING) - I reviewed patient records, labs, notes, testing and imaging myself where available.      No data to display           Lab Results  Component Value Date   WBC 6.9 07/20/2019   HGB 10.1 (L) 07/20/2019   HCT 34.8 (L) 07/20/2019   MCV 83.7 07/20/2019   PLT 354 07/20/2019      Component Value Date/Time   NA 136 07/20/2019 1621   K 3.7 07/20/2019 1621   CL 98 07/20/2019 1621   CO2 30 07/20/2019 1621   GLUCOSE 108 (H) 07/20/2019 1621   BUN 15 07/20/2019 1621   CREATININE 0.93 07/20/2019 1621   CALCIUM 8.8 (L) 07/20/2019 1621   PROT 5.1 (L) 03/31/2019 0302   ALBUMIN 1.9 (L) 03/31/2019 0302   AST 10 (L) 03/31/2019 0302   ALT 12 03/31/2019 0302   ALKPHOS 48 03/31/2019 0302   BILITOT 1.1 03/31/2019 0302   GFRNONAA 58 (L) 07/20/2019 1621   GFRAA >60 07/20/2019 1621   Lab Results  Component Value Date   CHOL 191 07/19/2011   HDL 35 (L) 07/19/2011   LDLCALC 111 (H) 07/19/2011   TRIG 223 (H) 07/19/2011   CHOLHDL 5.5 07/19/2011   Lab Results  Component Value Date   HGBA1C 6.2 (H) 07/19/2011   Lab Results  Component Value Date   VITAMINB12 404 06/12/2008   Lab Results  Component Value Date   TSH 2.704  02/07/2017     ASSESSMENT AND PLAN 82 y.o. year old female  has a past medical history of Anxiety, Arthritis, Asthma, Back pain, Chronic constipation, Depression, Dyspnea, Family history of adverse reaction to anesthesia, GERD (gastroesophageal reflux disease), High cholesterol, Hypertension, Lymphoma (HCC), Obesity, Sleep apnea, Stroke (HCC), and SVT (supraventricular tachycardia). here with   No diagnosis found.   Dallie Dad is doing fairly well on CPAP therapy. Compliance report reveals excellent compliance. She was encouraged to continue using CPAP nightly and for greater than 4 hours each night. AHI remains elevated. I have encouraged her to try to sleep on her side when she can. She Risks of untreated sleep apnea review and education materials provided. Healthy lifestyle habits  encouraged. She will follow up in 1 year, sooner if needed. She verbalizes understanding and agreement with this plan.    No orders of the defined types were placed in this encounter.     No orders of the defined types were placed in this encounter.      Shawnie Dapper, FNP-C 06/06/2023, 10:12 AM [facility] [address], [address] [place], [place] [address] [phone]

## 2023-06-06 NOTE — Patient Instructions (Signed)

## 2023-06-08 ENCOUNTER — Encounter: Payer: Self-pay | Admitting: Family Medicine

## 2023-06-08 ENCOUNTER — Ambulatory Visit (INDEPENDENT_AMBULATORY_CARE_PROVIDER_SITE_OTHER): Payer: HMO | Admitting: Family Medicine

## 2023-06-08 VITALS — BP 138/67 | HR 72 | Ht 60.0 in | Wt 275.0 lb

## 2023-06-08 DIAGNOSIS — G4733 Obstructive sleep apnea (adult) (pediatric): Secondary | ICD-10-CM | POA: Diagnosis not present

## 2023-06-15 ENCOUNTER — Telehealth: Payer: Self-pay | Admitting: Family Medicine

## 2023-06-15 NOTE — Telephone Encounter (Signed)
HST- HTA pending faxed notes.

## 2023-06-16 DIAGNOSIS — E785 Hyperlipidemia, unspecified: Secondary | ICD-10-CM | POA: Diagnosis not present

## 2023-06-17 DIAGNOSIS — R7301 Impaired fasting glucose: Secondary | ICD-10-CM | POA: Diagnosis not present

## 2023-06-17 DIAGNOSIS — E785 Hyperlipidemia, unspecified: Secondary | ICD-10-CM | POA: Diagnosis not present

## 2023-06-17 DIAGNOSIS — N1831 Chronic kidney disease, stage 3a: Secondary | ICD-10-CM | POA: Diagnosis not present

## 2023-06-17 DIAGNOSIS — I129 Hypertensive chronic kidney disease with stage 1 through stage 4 chronic kidney disease, or unspecified chronic kidney disease: Secondary | ICD-10-CM | POA: Diagnosis not present

## 2023-06-23 DIAGNOSIS — I10 Essential (primary) hypertension: Secondary | ICD-10-CM | POA: Diagnosis not present

## 2023-06-23 DIAGNOSIS — K581 Irritable bowel syndrome with constipation: Secondary | ICD-10-CM | POA: Diagnosis not present

## 2023-06-23 DIAGNOSIS — G4733 Obstructive sleep apnea (adult) (pediatric): Secondary | ICD-10-CM | POA: Diagnosis not present

## 2023-06-23 DIAGNOSIS — R82998 Other abnormal findings in urine: Secondary | ICD-10-CM | POA: Diagnosis not present

## 2023-06-23 DIAGNOSIS — M25562 Pain in left knee: Secondary | ICD-10-CM | POA: Diagnosis not present

## 2023-06-23 DIAGNOSIS — I890 Lymphedema, not elsewhere classified: Secondary | ICD-10-CM | POA: Diagnosis not present

## 2023-06-23 DIAGNOSIS — I4891 Unspecified atrial fibrillation: Secondary | ICD-10-CM | POA: Diagnosis not present

## 2023-06-23 DIAGNOSIS — Z0000 Encounter for general adult medical examination without abnormal findings: Secondary | ICD-10-CM | POA: Diagnosis not present

## 2023-06-23 DIAGNOSIS — J4540 Moderate persistent asthma, uncomplicated: Secondary | ICD-10-CM | POA: Diagnosis not present

## 2023-06-24 NOTE — Telephone Encounter (Signed)
HST HTA Berkley Harvey: [id number] (exp. 06/15/23 to 09/13/23)

## 2023-06-27 ENCOUNTER — Other Ambulatory Visit: Payer: Self-pay | Admitting: Pulmonary Disease

## 2023-07-05 DIAGNOSIS — G4733 Obstructive sleep apnea (adult) (pediatric): Secondary | ICD-10-CM | POA: Diagnosis not present

## 2023-07-19 ENCOUNTER — Ambulatory Visit: Payer: HMO | Admitting: Pulmonary Disease

## 2023-07-19 ENCOUNTER — Encounter: Payer: Self-pay | Admitting: Pulmonary Disease

## 2023-07-19 ENCOUNTER — Telehealth: Payer: Self-pay | Admitting: Pharmacist

## 2023-07-19 VITALS — BP 149/60 | HR 71 | Ht 60.0 in | Wt 274.0 lb

## 2023-07-19 DIAGNOSIS — R002 Palpitations: Secondary | ICD-10-CM | POA: Diagnosis not present

## 2023-07-19 DIAGNOSIS — R0609 Other forms of dyspnea: Secondary | ICD-10-CM

## 2023-07-19 DIAGNOSIS — J4541 Moderate persistent asthma with (acute) exacerbation: Secondary | ICD-10-CM

## 2023-07-19 DIAGNOSIS — J45909 Unspecified asthma, uncomplicated: Secondary | ICD-10-CM

## 2023-07-19 MED ORDER — FORMOTEROL FUMARATE 20 MCG/2ML IN NEBU: 20.0000 ug | INHALATION_SOLUTION | Freq: Two times a day (BID) | RESPIRATORY_TRACT | 11 refills | Status: DC

## 2023-07-19 MED ORDER — BUDESONIDE 0.5 MG/2ML IN SUSP: 0.5000 mg | Freq: Two times a day (BID) | RESPIRATORY_TRACT | 11 refills | Status: DC

## 2023-07-19 MED ORDER — NUCALA 100 MG/ML ~~LOC~~ SOAJ: 100.0000 mg | SUBCUTANEOUS | 5 refills | Status: DC

## 2023-07-19 MED ORDER — NUCALA 100 MG/ML ~~LOC~~ SOAJ: 100.0000 mg | SUBCUTANEOUS | 11 refills | Status: DC

## 2023-07-19 NOTE — Patient Instructions (Signed)
Nice to see you again  I am glad you are doing well  No changes to medication  Continue the nebulized formoterol and budesonide twice a day  Continue albuterol as needed  Continue Nucala injections every 4 weeks  Return to clinic in 6 months or sooner as needed with Dr. Judeth Horn

## 2023-07-19 NOTE — Telephone Encounter (Signed)
Patient had OV with Dr. Judeth Horn today. Provider portion and patient portion completed today. Patient portion shuld be placed in pharmacy mailbox. Provider portion in PAP pending info folder  Chesley Mires, PharmD, MPH, BCPS, CPP Clinical Pharmacist (Rheumatology and Pulmonology)

## 2023-07-19 NOTE — Progress Notes (Signed)
@Patient  ID: [name], female    DOB: August 15, 1940, 82 y.o.   MRN: [id number]  Chief Complaint  Patient presents with   Follow-up    Breathing is doing well. No new co's.     Referring provider: Garlan Fillers, MD  HPI:   82 y.o. 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 Started [employer] Summer 2022.   Doing well. Last seen ~10 months ago.  Continues on Nucala.  On inhaled therapy via budesonide, arformoterol nebulized.  Symptoms well controlled.  No exacerbations in interim.  No hospitalization for controlled asthma.  She does related to the palpitations and tunnel vision, presyncope.  On exam she is alert normal.  Discussed referral to cardiology.  HPI at initial visit:  Patient contracted Covid 07/2019.  She does stay at home.  She got monoclonal antibody infusion.  Since then, she has had about every month exacerbation of her underlying with presumably asthma.  Wheeze, worsening dyspnea exertion, cough.  She has gotten a course of prednisone and antibiotics.  Been told she has pneumonia.  Presently based on chest x-rays that I cannot observe or view.    Cough is productive.  Green to yellowish phlegm.  Seems may be worse in the mornings and evenings.  No environmental or seasonal changes.  No other alleviating or exacerbating factors.  Dyspnea on exertion obviously worse when she exerts her self,, better with rest.  Has developed shortness of breath at rest as well.  No timing.  They were things are better or worse.  No alleviating or exacerbating factors.  She has been using Trelegy and does not think it helps.  Most recent chest x-ray 07/2019 reviewed which reveals clear lungs, streaky opacity left base, heart size appears enlarged on my interpretation.   PMH: Asthma, sleep apnea, back pain Surgical history: C-section, knee replacement, hysterectomy, Family history: Mother with breast cancer, father with  CAD, CVA   Questionaires / Pulmonary Flowsheets:   ACT:  Asthma Control Test ACT Total Score  03/12/2022  1:54 PM 23  12/22/2020  4:29 PM 10  10/22/2020  4:18 PM 7    MMRC: mMRC Dyspnea Scale mMRC Score  06/11/2021  4:19 PM 2    Epworth:      No data to display          Tests:   FENO:  No results found for: "NITRICOXIDE"  PFT:     No data to display          WALK:      No data to display          Imaging: Personally reviewed and as per EMR and discussion of this note No results found.  Lab Results: Personally reviewed CBC    Component Value Date/Time   WBC 6.9 07/20/2019 1621   RBC 4.16 07/20/2019 1621   HGB 10.1 (L) 07/20/2019 1621   HGB 11.2 (L) 11/18/2008 1132   HCT 34.8 (L) 07/20/2019 1621   HCT 33.6 (L) 11/18/2008 1132   PLT 354 07/20/2019 1621   PLT 235 11/18/2008 1132   MCV 83.7 07/20/2019 1621   MCV 91.2 11/18/2008 1132   MCH 24.3 (L) 07/20/2019 1621   MCHC 29.0 (L) 07/20/2019 1621   RDW 16.4 (H) 07/20/2019 1621   RDW 14.7 (H) 11/18/2008 1132   LYMPHSABS 1.5 07/20/2019 1621   LYMPHSABS 0.5 (L) 11/18/2008 1132   MONOABS 1.1 (H) 07/20/2019 1621  MONOABS 0.0 (L) 11/18/2008 1132   EOSABS 0.1 07/20/2019 1621   EOSABS 0.5 11/18/2008 1132   BASOSABS 0.1 07/20/2019 1621   BASOSABS 0.0 11/18/2008 1132    BMET    Component Value Date/Time   NA 136 07/20/2019 1621   K 3.7 07/20/2019 1621   CL 98 07/20/2019 1621   CO2 30 07/20/2019 1621   GLUCOSE 108 (H) 07/20/2019 1621   BUN 15 07/20/2019 1621   CREATININE 0.93 07/20/2019 1621   CALCIUM 8.8 (L) 07/20/2019 1621   GFRNONAA 58 (L) 07/20/2019 1621   GFRAA >60 07/20/2019 1621    BNP    Component Value Date/Time   BNP 74.2 01/31/2017 1655    ProBNP    Component Value Date/Time   PROBNP 240.0 (H) 06/18/2008 1657    Specialty Problems       Pulmonary Problems   OSA on CPAP   Sleeps in sitting position due to orthopnea   Moderate persistent asthma    No Known  Allergies  Immunization History  Administered Date(s) Administered   Influenza Split 05/13/2011, 05/25/2012, 06/14/2013   Influenza,inj,quad, With Preservative 06/14/2013   Influenza-Unspecified 05/02/2017   Pneumococcal Polysaccharide-23 07/20/2011    Past Medical History:  Diagnosis Date   Anxiety    Arthritis    Asthma    hx of   Back pain    Chronic constipation    Depression    hx of   Dyspnea    Family history of adverse reaction to anesthesia    Richardson bp ELEVATED    GERD (gastroesophageal reflux disease)    High cholesterol    Hypertension    Lymphoma (HCC)    Obesity    Sleep apnea    uses a CPAP   Stroke (HCC)    2010   SVT (supraventricular tachycardia) (HCC)     Tobacco History: Social History   Tobacco Use  Smoking Status Never  Smokeless Tobacco Never   Counseling given: Not Answered   Continue to not smoke  Outpatient Encounter Medications as of 07/19/2023  Medication Sig   acetaminophen (TYLENOL) 500 MG tablet Take 1,000 mg by mouth 2 (two) times daily.   albuterol (VENTOLIN HFA) 108 (90 Base) MCG/ACT inhaler TAKE 2 PUFFS BY MOUTH EVERY 6 HOURS AS NEEDED FOR WHEEZE OR SHORTNESS OF BREATH   amLODipine (NORVASC) 5 MG tablet Take 5 mg by mouth daily.   benzonatate (TESSALON) 100 MG capsule Take by mouth 3 (three) times daily as needed for cough.   DULoxetine (CYMBALTA) 60 MG capsule Take 60 mg by mouth daily.   EPINEPHrine (EPIPEN 2-PAK) 0.3 mg/0.3 mL IJ SOAJ injection Inject 0.3 mg into the muscle as needed for anaphylaxis. Call 9-1-1 after use   furosemide (LASIX) 40 MG tablet Take 40 mg by mouth daily as needed (for fluid retention.).    hydrochlorothiazide (MICROZIDE) 12.5 MG capsule Take 12.5 mg by mouth daily.   metoprolol tartrate (LOPRESSOR) 50 MG tablet Take 50 mg by mouth 2 (two) times daily.   montelukast (SINGULAIR) 10 MG tablet Take 10 mg by mouth daily.   Multiple Vitamin (MULTIVITAMIN WITH MINERALS) TABS tablet Take 1 tablet by mouth  daily.   Nebulizers (SIDESTREAM NEBULIZER-DISP) MISC Use with nebulized medications as directed. Replace tubing every 2 weeks.   olmesartan (BENICAR) 40 MG tablet Take 40 mg by mouth daily.   ondansetron (ZOFRAN) 4 MG tablet Take 1 tablet (4 mg total) by mouth every 8 (eight) hours as needed for nausea or  vomiting.   pantoprazole (PROTONIX) 40 MG tablet    potassium chloride SA (K-DUR,KLOR-CON) 20 MEQ tablet Take 20 mEq by mouth daily as needed (With lasix).   pravastatin (PRAVACHOL) 20 MG tablet Take 20 mg by mouth daily.   PRESCRIPTION MEDICATION Inhale into the lungs at bedtime. CPAP   traMADol (ULTRAM) 50 MG tablet TAKE 1 TABLET BY MOUTH UP TO 3 TIMES A DAY AS NEEDED FOR MODERATE PAIN Oral TID, PRN for 90 days   Vitamin D, Ergocalciferol, (DRISDOL) 50000 UNITS CAPS Take 50,000 Units by mouth every Monday.    [DISCONTINUED] budesonide (PULMICORT) 0.5 MG/2ML nebulizer solution Take 2 mLs (0.5 mg total) by nebulization in the morning and at bedtime. Dx J45.40   [DISCONTINUED] formoterol (PERFOROMIST) 20 MCG/2ML nebulizer solution Take 2 mLs (20 mcg total) by nebulization 2 (two) times daily.   [DISCONTINUED] Mepolizumab (NUCALA) 100 MG/ML SOAJ Inject 1 mL (100 mg total) into the skin every 28 (twenty-eight) days.   budesonide (PULMICORT) 0.5 MG/2ML nebulizer solution Take 2 mLs (0.5 mg total) by nebulization in the morning and at bedtime. Dx J45.40   formoterol (PERFOROMIST) 20 MCG/2ML nebulizer solution Take 2 mLs (20 mcg total) by nebulization 2 (two) times daily.   [DISCONTINUED] Mepolizumab (NUCALA) 100 MG/ML SOAJ Inject 1 mL (100 mg total) into the skin every 28 (twenty-eight) days.   No facility-administered encounter medications on file as of 07/19/2023.     Review of Systems  Review of Systems  n/a Physical Exam  BP (!) 149/60 (BP Location: Right Arm, Cuff Size: Large)   Pulse 71   Ht 5' (1.524 m)   Wt 274 lb (124.3 kg)   SpO2 98%   BMI 53.51 kg/m   Wt Readings from Last 5  Encounters:  07/19/23 274 lb (124.3 kg)  06/08/23 275 lb (124.7 kg)  09/30/22 274 lb (124.3 kg)  06/02/22 274 lb (124.3 kg)  06/11/21 270 lb (122.5 kg)    BMI Readings from Last 5 Encounters:  07/19/23 53.51 kg/m  06/08/23 53.71 kg/m  09/30/22 53.51 kg/m  06/02/22 53.51 kg/m  03/12/22 52.73 kg/m     Physical Exam General: Well-appearing, no acute distress Eyes: EOMI, no icterus Neck: Supple, no JVP Pulmonary: Lungs clear, no wheezes, normal work of breathing Cardiovascular: Regular rate and rhythm, significant 2 out of 6 midsystolic murmur Neuro: Seated in wheelchair, no focal weakness, sensation intact   Assessment & Plan:   Dyspnea exertion: Likely multifactorial related to deconditioning, obesity, concern for uncontrolled asthma given multiple prednisone courses with mild improvement each time, possible other cardiac etiologies.  Prior echocardiogram reassuring.  Improved with Nucala.  Asthma: Worsening dyspnea on exertion, shortness of breath, cough the early 2022.  Exacerbations through the spring.  Adherent to ICS/LABA nebulizer.  Despite this, ongoing symptoms.  Nucala started summer 2022.  Resolution of cough, greatly improved dyspnea on exertion.  To continue Nucala and nebulized therapies ICS/LABA.  All 3 refilled today.  Symptomatic palpitations: 5 minutes of bleeding heart, fast heart rate, regular upper chest surgery with total vision presyncope.  Referral to cardiology.  Heart murmur: Not described in my prior exams.  History of mild MVR on prior echocardiogram.  Referral to cardiology as above.   Return in about 6 months (around 01/17/2024).   Karren Burly, MD 07/19/2023

## 2023-07-20 ENCOUNTER — Other Ambulatory Visit: Payer: Self-pay | Admitting: Pulmonary Disease

## 2023-07-21 NOTE — Telephone Encounter (Addendum)
Received signed patient forms. Left VM for patient requesting return call with household income information. Application placed in awaiting response folder  Submitted a Prior Authorization request to [facility] ADVANTAGE/RX ADVANCE for NUCALA via CoverMyMeds. Will update once we receive a response.  Key: [id number]

## 2023-07-22 NOTE — Telephone Encounter (Signed)
Received notification from [facility] ADVANTAGE/RX ADVANCE regarding a prior authorization for NUCALA. Authorization has been APPROVED from 07/21/23 to 01/19/24. Approval letter sent to scan center.  Authorization # [id number]  PAP renewal application can now be submitted

## 2023-07-25 NOTE — Telephone Encounter (Signed)
Submitted Patient Assistance RENEWAL Application to [facility] [place] for [place] along with provider portion, patient portion, PA, medication list, insurance card copy. Will update patient when we receive a response.  Phone #: [phone] Fax #: [phone]  Chesley Mires, PharmD, MPH, BCPS, CPP Clinical Pharmacist (Rheumatology and Pulmonology)

## 2023-08-24 ENCOUNTER — Telehealth: Payer: Self-pay

## 2023-08-24 NOTE — Telephone Encounter (Signed)
Sleep lab has tried contacting patient to schedule for home sleep test that Amy NP has ordered. We have tried contacting twice (07/04/23 & 08/24/23) and unable to reach. VM full on both mobile and home numbers.

## 2023-08-26 DIAGNOSIS — H353111 Nonexudative age-related macular degeneration, right eye, early dry stage: Secondary | ICD-10-CM | POA: Diagnosis not present

## 2023-08-26 DIAGNOSIS — H40023 Open angle with borderline findings, high risk, bilateral: Secondary | ICD-10-CM | POA: Diagnosis not present

## 2023-08-30 NOTE — Telephone Encounter (Signed)
Received a fax from  [facility] [place] regarding an approval for NUCALA patient assistance from 08/20/2023 to 08/01/2024. Approval letter sent to scan center.  Phone #: [phone] Fax #: [phone]  Called patient's daughter Bard Herbert and notified of approval. Provided her with phone number for [facility] [phone])  Chesley Mires, PharmD, MPH, BCPS, CPP Clinical Pharmacist (Rheumatology and Pulmonology)

## 2023-08-30 NOTE — Telephone Encounter (Signed)
Patient daughter is calling for an update on her mother's Nucala order. Please call and advise [phone]

## 2023-09-05 ENCOUNTER — Other Ambulatory Visit: Payer: Self-pay | Admitting: Nurse Practitioner

## 2023-09-05 DIAGNOSIS — J4541 Moderate persistent asthma with (acute) exacerbation: Secondary | ICD-10-CM

## 2023-09-23 ENCOUNTER — Telehealth: Payer: Self-pay | Admitting: Pulmonary Disease

## 2023-09-23 NOTE — Telephone Encounter (Signed)
PT wanted to speak to Dr. Laurena Spies nurse. She has questions about her mother.  Mom was sick and went to her PCP wanting Pred. That Dr. Catalina Pizza her she was to never take Pred again for the rest of her life. PT asked when did that happen and the PCP told her that is something our Dr.'s should have told her. (Maybe because she is on the Nucala now.) Daughter Bard Herbert ([facility]) wants to ask why she as not told this when seeing the Dr. The last time. Her # is [phone]

## 2023-09-23 NOTE — Telephone Encounter (Signed)
Mom had the flu recently and wanted to be put on Prednisone.  She was told that there was a note in her chart that she should never be put on prednisone again and that it was from our office.  They did not give a reason and told them they would have to contact our office.  Advised that I would look in her chart and also send a message to Dr. Judeth Horn to get clarification about her mother not being given prednisone ever again.  I let her know that once we heard back from Dr. Judeth Horn we would call her back with he response.  She verbalized understanding.  She did say that this is the first time that she has struggled since she started [place] 3 years ago.  She is doing better now, but wanted to address this issue about the prednisone for future reference.  Dr. Judeth Horn, Please advise regarding patient not being given prednisone ever again and why our office would put that note in the patient's chart.  I did not see a note in her chart regarding her not being given prednisone.  Thank you.

## 2023-09-26 NOTE — Telephone Encounter (Signed)
 No where in my multiple notes is there any indication that I could find that says she should not get prednisone. In fact, my notes state that prednisone has been helpful in improving symptoms. Message stating the same left on daughter voicemail at number provided.

## 2023-09-27 NOTE — Telephone Encounter (Signed)
 Pt daughter, Bard Herbert states she was never diagnosed with the flu but she has got better and the pt does not need anything. I informed Bard Herbert that if her mother needed anything, then to call our office. Daphne verbalized understanding. NFN

## 2023-10-03 DIAGNOSIS — G4733 Obstructive sleep apnea (adult) (pediatric): Secondary | ICD-10-CM | POA: Diagnosis not present

## 2023-10-14 ENCOUNTER — Other Ambulatory Visit: Payer: Self-pay | Admitting: Pulmonary Disease

## 2023-10-26 DIAGNOSIS — Z1231 Encounter for screening mammogram for malignant neoplasm of breast: Secondary | ICD-10-CM | POA: Diagnosis not present

## 2023-11-10 DIAGNOSIS — D509 Iron deficiency anemia, unspecified: Secondary | ICD-10-CM | POA: Diagnosis not present

## 2023-11-10 DIAGNOSIS — C8338 Diffuse large B-cell lymphoma, lymph nodes of multiple sites: Secondary | ICD-10-CM | POA: Diagnosis not present

## 2024-01-01 DIAGNOSIS — G4733 Obstructive sleep apnea (adult) (pediatric): Secondary | ICD-10-CM | POA: Diagnosis not present

## 2024-02-22 ENCOUNTER — Other Ambulatory Visit: Payer: Self-pay | Admitting: Pulmonary Disease

## 2024-02-22 DIAGNOSIS — J4541 Moderate persistent asthma with (acute) exacerbation: Secondary | ICD-10-CM

## 2024-02-22 NOTE — Telephone Encounter (Signed)
 Copied from CRM [id number]. Topic: Clinical - Medication Refill >> Feb 22, 2024  4:36 PM Leila C wrote: Most Recent Pulmonary Care Visit:  Provider: Dr. Donnice Beals Department: [facility] Pulmonary Care Date: 07/19/23  Medication(s): Mepolizumab  (NUCALA ) 100 MG/ML SOAJ   Has the patient contacted their pharmacy? Yes (Agent: If no, request that the patient contact the pharmacy for the refill. If patient does not wish to contact the pharmacy document the reason why and proceed with request.) (Agent: If yes, when and what did the pharmacy advise?)  This is the patient's preferred pharmacy:  Patient's child Garrison [phone] states medication is sent to a specialty pharmacy and is unsure of where. Garrison works this out with Sherry Pennant, RPH.   Is this the correct pharmacy for this prescription?  If no, delete pharmacy and type the correct one.   Has the prescription been filled recently? No  Is the patient out of the medication? No  Has the patient been seen for an appointment in the last year OR does the patient have an upcoming appointment? Yes, 04/18/24  Can we respond through MyChart? No, please call patient's child Garrison [phone]  Agent: Please be advised that Rx refills may take up to 3 business days. We ask that you follow-up with your pharmacy.

## 2024-02-23 MED ORDER — NUCALA 100 MG/ML ~~LOC~~ SOAJ: 100.0000 mg | SUBCUTANEOUS | 5 refills | Status: DC

## 2024-02-23 NOTE — Telephone Encounter (Signed)
 Nucala  rx faxed to [facility]  Phone #: [phone] Fax #: [phone]  Sherry Pennant, PharmD, MPH, BCPS, CPP Clinical Pharmacist (Rheumatology and Pulmonology)

## 2024-03-31 DIAGNOSIS — G4733 Obstructive sleep apnea (adult) (pediatric): Secondary | ICD-10-CM | POA: Diagnosis not present

## 2024-04-18 ENCOUNTER — Ambulatory Visit: Admitting: Pulmonary Disease

## 2024-04-18 ENCOUNTER — Encounter: Payer: Self-pay | Admitting: Pulmonary Disease

## 2024-04-18 VITALS — BP 137/64 | HR 87 | Temp 98.5°F | Ht 61.0 in | Wt 265.0 lb

## 2024-04-18 DIAGNOSIS — J45909 Unspecified asthma, uncomplicated: Secondary | ICD-10-CM | POA: Diagnosis not present

## 2024-04-18 DIAGNOSIS — R002 Palpitations: Secondary | ICD-10-CM

## 2024-04-18 DIAGNOSIS — R0609 Other forms of dyspnea: Secondary | ICD-10-CM | POA: Diagnosis not present

## 2024-04-18 DIAGNOSIS — J4541 Moderate persistent asthma with (acute) exacerbation: Secondary | ICD-10-CM

## 2024-04-18 NOTE — Progress Notes (Signed)
 @Patient  ID: [name], female    DOB: [birth year], 83 y.o.   MRN: [id number]  No chief complaint on file.   Referring provider: Yolande Toribio MATSU, MD  HPI:   83 y.o. 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 Started Nucala  Summer 2022.   Doing well. Last seen ~10 months ago.  Continues on Nucala .  On inhaled therapy via budesonide , arformoterol nebulized.  Symptoms well controlled.  No exacerbations in interim requiring prednisone .  No hospitalization for controlled asthma.  Did get what sounds like an illness and discussed using prednisone  with myself and her PCP.  Fortunately symptoms resolved on their own prior to needing the prednisone .  HPI at initial visit:  Patient contracted Covid 07/2019.  She does stay at home.  She got monoclonal antibody infusion.  Since then, she has had about every month exacerbation of her underlying with presumably asthma.  Wheeze, worsening dyspnea exertion, cough.  She has gotten a course of prednisone  and antibiotics.  Been told she has pneumonia.  Presently based on chest x-rays that I cannot observe or view.    Cough is productive.  Green to yellowish phlegm.  Seems may be worse in the mornings and evenings.  No environmental or seasonal changes.  No other alleviating or exacerbating factors.  Dyspnea on exertion obviously worse when she exerts her self,, better with rest.  Has developed shortness of breath at rest as well.  No timing.  They were things are better or worse.  No alleviating or exacerbating factors.  She has been using Trelegy and does not think it helps.  Most recent chest x-ray 07/2019 reviewed which reveals clear lungs, streaky opacity left base, heart size appears enlarged on my interpretation.   PMH: Asthma, sleep apnea, back pain Surgical history: C-section, knee replacement, hysterectomy, Family history: Mother with breast cancer, father with CAD,  CVA   Questionaires / Pulmonary Flowsheets:   ACT:  Asthma Control Test ACT Total Score  03/12/2022  1:54 PM 23  12/22/2020  4:29 PM 10  10/22/2020  4:18 PM 7    MMRC: mMRC Dyspnea Scale mMRC Score  06/11/2021  4:19 PM 2    [place]:      No data to display          Tests:   FENO:  No results found for: NITRICOXIDE  PFT:     No data to display          WALK:      No data to display          Imaging: Personally reviewed and as per EMR and discussion of this note No results found.  Lab Results: Personally reviewed CBC    Component Value Date/Time   WBC 6.9 07/20/2019 1621   RBC 4.16 07/20/2019 1621   HGB 10.1 (L) 07/20/2019 1621   HGB 11.2 (L) 11/18/2008 1132   HCT 34.8 (L) 07/20/2019 1621   HCT 33.6 (L) 11/18/2008 1132   PLT 354 07/20/2019 1621   PLT 235 11/18/2008 1132   MCV 83.7 07/20/2019 1621   MCV 91.2 11/18/2008 1132   MCH 24.3 (L) 07/20/2019 1621   MCHC 29.0 (L) 07/20/2019 1621   RDW 16.4 (H) 07/20/2019 1621   RDW 14.7 (H) 11/18/2008 1132   LYMPHSABS 1.5 07/20/2019 1621   LYMPHSABS 0.5 (L) 11/18/2008 1132   MONOABS 1.1 (H) 07/20/2019 1621   MONOABS 0.0 (L) 11/18/2008 1132  EOSABS 0.1 07/20/2019 1621   EOSABS 0.5 11/18/2008 1132   BASOSABS 0.1 07/20/2019 1621   BASOSABS 0.0 11/18/2008 1132    BMET    Component Value Date/Time   NA 136 07/20/2019 1621   K 3.7 07/20/2019 1621   CL 98 07/20/2019 1621   CO2 30 07/20/2019 1621   GLUCOSE 108 (H) 07/20/2019 1621   BUN 15 07/20/2019 1621   CREATININE 0.93 07/20/2019 1621   CALCIUM 8.8 (L) 07/20/2019 1621   GFRNONAA 58 (L) 07/20/2019 1621   GFRAA >60 07/20/2019 1621    BNP    Component Value Date/Time   BNP 74.2 01/31/2017 1655    ProBNP    Component Value Date/Time   PROBNP 240.0 (H) 06/18/2008 1657    Specialty Problems       Pulmonary Problems   OSA on CPAP   Sleeps in sitting position due to orthopnea   Moderate persistent asthma    No Known  Allergies  Immunization History  Administered Date(s) Administered   Influenza Split 05/13/2011, 05/25/2012, 06/14/2013   Influenza,inj,quad, With Preservative 06/14/2013   Influenza-Unspecified 05/02/2017   Pneumococcal Polysaccharide-23 07/20/2011    Past Medical History:  Diagnosis Date   Anxiety    Arthritis    Asthma    hx of   Back pain    Chronic constipation    Depression    hx of   Dyspnea    Family history of adverse reaction to anesthesia    DAD bp ELEVATED    GERD (gastroesophageal reflux disease)    High cholesterol    Hypertension    Lymphoma (HCC)    Obesity    Sleep apnea    uses a CPAP   Stroke (HCC)    2010   SVT (supraventricular tachycardia) (HCC)     Tobacco History: Social History   Tobacco Use  Smoking Status Never  Smokeless Tobacco Never   Counseling given: Not Answered   Continue to not smoke  Outpatient Encounter Medications as of 04/18/2024  Medication Sig   acetaminophen  (TYLENOL ) 500 MG tablet Take 1,000 mg by mouth 2 (two) times daily.   albuterol  (VENTOLIN  HFA) 108 (90 Base) MCG/ACT inhaler TAKE 2 PUFFS BY MOUTH EVERY 6 HOURS AS NEEDED FOR WHEEZE OR SHORTNESS OF BREATH   amLODipine  (NORVASC ) 5 MG tablet Take 5 mg by mouth daily.   benzonatate  (TESSALON ) 100 MG capsule Take by mouth 3 (three) times daily as needed for cough.   budesonide  (PULMICORT ) 0.5 MG/2ML nebulizer solution Generic for Pulmicort . Inhale one vial in nebulizer twice a day. Rinse mouth after use.   DULoxetine  (CYMBALTA ) 60 MG capsule Take 60 mg by mouth daily.   EPINEPHrine  (EPIPEN  2-PAK) 0.3 mg/0.3 mL IJ SOAJ injection Inject 0.3 mg into the muscle as needed for anaphylaxis. Call 9-1-1 after use   formoterol  (PERFOROMIST ) 20 MCG/2ML nebulizer solution Substituted for: Perforomist  Solution Inhale one vial in nebulizer twice a day.   furosemide  (LASIX ) 40 MG tablet Take 40 mg by mouth daily as needed (for fluid retention.).    hydrochlorothiazide  (MICROZIDE )  12.5 MG capsule Take 12.5 mg by mouth daily.   Mepolizumab  (NUCALA ) 100 MG/ML SOAJ Inject 1 mL (100 mg total) into the skin every 28 (twenty-eight) days.   metoprolol  tartrate (LOPRESSOR ) 50 MG tablet Take 50 mg by mouth 2 (two) times daily.   montelukast  (SINGULAIR ) 10 MG tablet Take 10 mg by mouth daily.   Multiple Vitamin (MULTIVITAMIN WITH MINERALS) TABS tablet Take 1 tablet by  mouth daily.   Nebulizers (SIDESTREAM NEBULIZER-DISP) MISC Use with nebulized medications as directed. Replace tubing every 2 weeks.   olmesartan  (BENICAR ) 40 MG tablet Take 40 mg by mouth daily.   ondansetron  (ZOFRAN ) 4 MG tablet Take 1 tablet (4 mg total) by mouth every 8 (eight) hours as needed for nausea or vomiting.   pantoprazole  (PROTONIX ) 40 MG tablet    potassium chloride  SA (K-DUR,KLOR-CON ) 20 MEQ tablet Take 20 mEq by mouth daily as needed (With lasix ).   pravastatin (PRAVACHOL) 20 MG tablet Take 20 mg by mouth daily.   PRESCRIPTION MEDICATION Inhale into the lungs at bedtime. CPAP   traMADol  (ULTRAM ) 50 MG tablet TAKE 1 TABLET BY MOUTH UP TO 3 TIMES A DAY AS NEEDED FOR MODERATE PAIN Oral TID, PRN for 90 days   Vitamin D, Ergocalciferol, (DRISDOL) 50000 UNITS CAPS Take 50,000 Units by mouth every Monday.    No facility-administered encounter medications on file as of 04/18/2024.     Review of Systems  Review of Systems  n/a Physical Exam  BP 137/64   Pulse 87   Temp 98.5 F (36.9 C) (Oral)   Ht 5' 1 (1.549 m)   Wt 265 lb (120.2 kg)   SpO2 95%   BMI 50.07 kg/m   Wt Readings from Last 5 Encounters:  04/18/24 265 lb (120.2 kg)  07/19/23 274 lb (124.3 kg)  06/08/23 275 lb (124.7 kg)  09/30/22 274 lb (124.3 kg)  06/02/22 274 lb (124.3 kg)    BMI Readings from Last 5 Encounters:  04/18/24 50.07 kg/m  07/19/23 53.51 kg/m  06/08/23 53.71 kg/m  09/30/22 53.51 kg/m  06/02/22 53.51 kg/m     Physical Exam General: Well-appearing, no acute distress Eyes: EOMI, no icterus Neck:  Supple, no JVP Pulmonary: Lungs clear, no wheezes, normal work of breathing Cardiovascular: Regular rate and rhythm,  Neuro: Seated in wheelchair, no focal weakness, sensation intact   Assessment & Plan:   Dyspnea exertion: Likely multifactorial related to deconditioning, obesity, concern for uncontrolled asthma given multiple prednisone  courses with mild improvement each time, possible other cardiac etiologies.  Prior echocardiogram reassuring.  Improved with Nucala .  Asthma: Worsening dyspnea on exertion, shortness of breath, cough the early 2022.  Exacerbations through the spring.  Adherent to ICS/LABA nebulizer.  Despite this, ongoing symptoms.  Nucala  started summer 2022.  Resolution of cough, greatly improved dyspnea on exertion.  To continue Nucala  and nebulized therapies ICS/LABA.     Return in about 6 months (around 10/16/2024) for f/u Dr. Annella.   Donnice JONELLE Annella, MD 04/18/2024

## 2024-05-14 ENCOUNTER — Other Ambulatory Visit: Payer: Self-pay | Admitting: Pulmonary Disease

## 2024-05-14 DIAGNOSIS — J4541 Moderate persistent asthma with (acute) exacerbation: Secondary | ICD-10-CM

## 2024-05-31 ENCOUNTER — Encounter: Payer: Self-pay | Admitting: Family Medicine

## 2024-06-07 NOTE — Progress Notes (Signed)
 PATIENT: [name] DOB: 11-28-1940  REASON FOR VISIT: follow up HISTORY FROM: patient  Chief Complaint  Patient presents with   RM1/CPAP    Pt is here Alone. Pt states that everything is going good with her CPAP Machine.      HISTORY OF PRESENT ILLNESS:  06/13/24 ALL:  [name] returns for follow up for OSA on CPAP. She reports doing well. She is using CPAP nightly for about 7 hours, on average. She denies concerns with machine or supplies. She does continue to receive a red frown face indicating poor mask seal. She is using F20 in medium.   She is eligible for a new machine.     06/08/2023 ALL: [name] returns for follow up for OSA on CPAP. She continues to do well on therapy. She is using CPAP nightly for about 8 hours, on average. She denies concerns with machine or supplies. She has noted more air leaking. Using F20 medium mask. She is eligible for a new CPAP.     06/02/2022 ALL:  [name] returns for follow up for OSA on CPAP. She is using CPAP nightly for about 8-9 hours. Leak as improved. AHI is improved from last visit but remains slightly elevated at 7.8/h.       06/01/2021 ALL: [name] is a 83 y.o. female here today for follow up for OSA on CPAP. She reports doing well on CPAP. She continues to note a significant air leak. She reports head gear is stretched and Velcro does not attach. She has had trouble with leak for several years. AHI is now elevated at 9/hr.       01/24/19 ALL (Mychart): [name] is a 83 y.o. 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  She reports that she is doing very well with CPAP therapy.  She states that she cannot sleep without her machine.  Compliance data dated 12/24/2018 through 01/22/2019 reveals that she is using her machine every day for compliance of 100%.  Every day she used her machine greater than 4 hours.  Average usage was 7 hours and 53 minutes.  AHI was elevated at 7.1 on 4 to 20 cm of water and an  EPR of 1.  Review of pressures she is frequently reaching 20 cm of water. She is here today for follow-up.   History (copied from Dr Dohmeier's note on 04/27/2018)   HPI:  [name] is a 83 y.o. female , seen here as in a referral from Dr. Yolande to re-establish sleep care. The patient had been referred by Dr Rosemarie in January 2013, with a BMI of 46 /h underwent on 10-13-2011 PSG with Capnography, resulting in an AHI of 32/ h, RDI of 38/h and CO2 retention. 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 This was followed by CPAP titration on 11-02-2011, to a final 11 cm water with REM sleep rebound. She has used her original CPAP machine ever since.   In July 2018 the patient was admitted to [facility] after she developed an ulcerative bleed in the upper GI tract.  During the hospitalization she was provided positive airway pressure with a full facemask.  She urgently needs supplies as this mask is not well fitting, she is noting that air leaks, and that she has also noted that her humidifier chamber is empty in the morning.  This is not to be the case when she started the  CPAP therapy her machine was issued in the year 2013 and she is definitely due for a new one.  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 Rosemarie in the stroke service which led to her referral for the sleep study.  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    Lives in [place], [place] patient has been a compliant CPAP user and her compliance report for the last 30 days shows 97% compliance with an average time of use of 6 hours and 43 minutes.   Her CPAP is an S9 Elite machine it is set at 11 cmH2O was 1 cm EPR, residual AHI is 3.8 and the air leaks are horrendous.  She is having a median air leak of 69.5 L/min.   Chief complaint according to patient : need a new machine - leaking mask and machine loses water.    Sleep habits are as follows: dinner is around 7.30PM. Afterwards, she reads on an I pad, talks to friends and relatives long distance. Crafts ( making wreaths). She goes to bed at 10-10.30 , she sleeps in a non- adjustable bed. She sleeps on 3-4  Pillows, reclined on her back.  She stays asleep until 2 AM to go to the toilet. She has sometimes a second break around 4 AM. She dreams- but she is not refreshed, restored when waking up at 5.45 AM , rises not earlier than 6.45 AM. She has a dry mouth but no headaches.    Sleep medical history and family sleep history:  No other family member has a diagnosis of OSA.    Social history:  Lives alone, widowed since 3/ 2017. 5 adult children, 4 live close.  No smoker, non ETOH use,   She drinks daily iced tea- [employer]. no coffee, some sodas- she cut down 6 month ago, until then 4-6 sodas a day.  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   REVIEW OF SYSTEMS: Out of a complete 14 system review of symptoms, the patient complains only of the following symptoms, right knee pain and all other reviewed systems are negative.  ESS: 8/24   ALLERGIES: No Known Allergies  HOME MEDICATIONS: Outpatient Medications Prior to Visit  Medication Sig Dispense Refill   acetaminophen  (TYLENOL ) 500 MG tablet Take 1,000 mg by mouth 2 (two) times daily.     albuterol  (VENTOLIN  HFA) 108 (90 Base) MCG/ACT inhaler TAKE 2 PUFFS BY MOUTH EVERY 6 HOURS AS NEEDED FOR WHEEZE OR SHORTNESS OF BREATH 18 each 2   amLODipine  (NORVASC ) 5 MG tablet Take 5 mg by mouth daily.     budesonide  (PULMICORT ) 0.5 MG/2ML nebulizer solution TAKE 2 MLS (0.5 MG TOTAL) BY NEBULIZATION IN THE MORNING AND  AT BEDTIME. DX J45.40 360 mL 3   DULoxetine  (CYMBALTA ) 60 MG capsule Take 60 mg by mouth daily.     formoterol  (PERFOROMIST ) 20 MCG/2ML nebulizer solution Substituted for: Perforomist  Solution Inhale one vial in nebulizer twice a day. 60 mL 11   furosemide  (LASIX ) 40 MG tablet Take 40 mg by mouth daily as needed (for fluid retention.).      hydrochlorothiazide  (MICROZIDE ) 12.5 MG capsule Take 12.5 mg by mouth  daily.     metoprolol  tartrate (LOPRESSOR ) 50 MG tablet Take 50 mg by mouth 2 (two) times daily.     montelukast  (SINGULAIR ) 10 MG tablet Take 10 mg by mouth daily.     Multiple Vitamin (MULTIVITAMIN WITH MINERALS) TABS tablet Take 1 tablet by mouth daily.     Nebulizers (SIDESTREAM NEBULIZER-DISP) MISC Use with nebulized medications as directed. Replace tubing every 2 weeks. 1 each 10   olmesartan  (BENICAR ) 40 MG tablet Take 40 mg by mouth daily.     ondansetron  (ZOFRAN ) 4 MG tablet Take 1 tablet (4 mg total) by mouth every 8 (eight) hours as needed for nausea or vomiting. 12 tablet 0   pantoprazole  (PROTONIX ) 40 MG tablet      potassium chloride  SA (K-DUR,KLOR-CON ) 20 MEQ tablet Take 20 mEq by mouth daily as needed (With lasix ).     pravastatin (PRAVACHOL) 20 MG tablet Take 20 mg by mouth daily.     traMADol  (ULTRAM ) 50 MG tablet TAKE 1 TABLET BY MOUTH UP TO 3 TIMES A DAY AS NEEDED FOR MODERATE PAIN Oral TID, PRN for 90 days     benzonatate  (TESSALON ) 100 MG capsule Take by mouth 3 (three) times daily as needed for cough. (Patient not taking: Reported on 06/13/2024)     EPINEPHrine  (EPIPEN  2-PAK) 0.3 mg/0.3 mL IJ SOAJ injection Inject 0.3 mg into the muscle as needed for anaphylaxis. Call 9-1-1 after use (Patient not taking: Reported on 06/13/2024) 1 each 2   Mepolizumab  (NUCALA ) 100 MG/ML SOAJ Inject 1 mL (100 mg total) into the skin every 28 (twenty-eight) days. (Patient not taking: Reported on 06/13/2024) 1 mL 5   PRESCRIPTION MEDICATION Inhale into the lungs at bedtime. CPAP     Vitamin  D, Ergocalciferol, (DRISDOL) 50000 UNITS CAPS Take 50,000 Units by mouth every Monday.  (Patient not taking: Reported on 06/13/2024)     No facility-administered medications prior to visit.    PAST MEDICAL HISTORY: Past Medical History:  Diagnosis Date   Anxiety    Arthritis    Asthma    hx of   Back pain    Chronic constipation    Depression    hx of   Dyspnea    Family history of adverse reaction to anesthesia    DAD bp ELEVATED    GERD (gastroesophageal reflux disease)    High cholesterol    Hypertension    Lymphoma (HCC)    Obesity    Sleep apnea    uses a CPAP   Stroke (HCC)    2010   SVT (supraventricular tachycardia)     PAST SURGICAL HISTORY: Past Surgical History:  Procedure Laterality Date   BACK SURGERY  2009   for lymphoma   BIOPSY  05/24/2019   Procedure: BIOPSY;  Surgeon: Legrand Victory LITTIE DOUGLAS, MD;  Location: [facility];  Service: Gastroenterology;;   BONE BIOPSY     CESAREAN SECTION     ESOPHAGOGASTRODUODENOSCOPY     ESOPHAGOGASTRODUODENOSCOPY (EGD) WITH PROPOFOL  N/A 08/05/2017   Procedure: ESOPHAGOGASTRODUODENOSCOPY (EGD) WITH PROPOFOL ;  Surgeon: Legrand Victory LITTIE DOUGLAS, MD;  Location: [facility];  Service: Gastroenterology;  Laterality: N/A;   ESOPHAGOGASTRODUODENOSCOPY (EGD) WITH PROPOFOL  N/A 05/24/2019   Procedure: ESOPHAGOGASTRODUODENOSCOPY (EGD) WITH PROPOFOL ;  Surgeon: Legrand Victory LITTIE DOUGLAS, MD;  Location: [facility];  Service: Gastroenterology;  Laterality: N/A;   EYE SURGERY     cataracts   LAPAROSCOPY N/A 02/01/2017   Procedure: LAPAROSCOPY DIAGNOSTIC;  Surgeon: Vanderbilt Ned, MD;  Location: [facility];  Service: General;  Laterality: N/A;   REPLACEMENT TOTAL KNEE     right   TOTAL ABDOMINAL HYSTERECTOMY  1973    FAMILY HISTORY: Family History  Problem Relation Age of Onset   Lymphoma Mother    Breast cancer Mother    Coronary artery disease Father 42       died   Stroke Father    Colon cancer Maternal Uncle     SOCIAL HISTORY: Social  History   Socioeconomic History   Marital status: Widowed    Spouse name: Not on file   Number of children: 5   Years of education: Not on file   Highest education level: Not on file  Occupational History   Occupation: building surveyor  Tobacco Use   Smoking status: Never   Smokeless tobacco: Never  Vaping Use   Vaping status: Never Used  Substance and Sexual Activity   Alcohol  use: No   Drug use: No   Sexual activity: Not Currently  Other Topics Concern   Not on file  Social History Narrative   Not on file   Social Drivers of Health   Financial Resource Strain: Not on file  Food Insecurity: Not on file  Transportation Needs: Not on file  Physical Activity: Not on file  Stress: Not on file  Social Connections: Not on file  Intimate Partner Violence: Not on file     PHYSICAL EXAM  Vitals:   06/13/24 0913  Weight: 276 lb (125.2 kg)  Height: 5' 4 (1.626 m)      Body mass index is 47.38 kg/m.  Neck cir: 17.5 Mallampati: 3  Generalized: Well developed, in no acute distress  Cardiology: normal rate and rhythm, no murmur noted Respiratory: clear to auscultation bilaterally  Neurological examination  Mentation: Alert oriented to time, place, history taking. Follows all commands speech and language fluent Cranial nerve II-XII: Pupils were equal round reactive to light. Extraocular movements were full, visual field were full  Motor: The motor testing reveals 5 over 5 strength of all 4 extremities. Good symmetric motor tone is noted throughout.  Gait and station: Gait is arthritic, walks with Rolator.    DIAGNOSTIC DATA (LABS, IMAGING, TESTING) - I reviewed patient records, labs, notes, testing and imaging myself where available.      No data to display           Lab Results  Component Value Date   WBC 6.9 07/20/2019   HGB 10.1 (L) 07/20/2019   HCT 34.8 (L) 07/20/2019   MCV 83.7 07/20/2019   PLT 354 07/20/2019      Component Value Date/Time   NA  136 07/20/2019 1621   K 3.7 07/20/2019 1621   CL 98 07/20/2019 1621   CO2 30 07/20/2019 1621   GLUCOSE 108 (H) 07/20/2019 1621   BUN 15 07/20/2019 1621   CREATININE 0.93 07/20/2019 1621   CALCIUM 8.8 (L) 07/20/2019 1621   PROT 5.1 (L) 03/31/2019 0302   ALBUMIN  1.9 (L) 03/31/2019 0302   AST 10 (L) 03/31/2019 0302   ALT 12 03/31/2019 0302   ALKPHOS 48 03/31/2019 0302   BILITOT 1.1 03/31/2019 0302   GFRNONAA 58 (L) 07/20/2019 1621   GFRAA >60 07/20/2019 1621   Lab Results  Component Value Date   CHOL 191 07/19/2011   HDL 35 (L) 07/19/2011   LDLCALC 111 (H) 07/19/2011   TRIG 223 (H) 07/19/2011   CHOLHDL 5.5 07/19/2011   Lab Results  Component Value Date   HGBA1C  6.2 (H) 07/19/2011   Lab Results  Component Value Date   VITAMINB12 404 06/12/2008   Lab Results  Component Value Date   TSH 2.704 02/07/2017     ASSESSMENT AND PLAN 83 y.o. year old female  has a past medical history of Anxiety, Arthritis, Asthma, Back pain, Chronic constipation, Depression, Dyspnea, Family history of adverse reaction to anesthesia, GERD (gastroesophageal reflux disease), High cholesterol, Hypertension, Lymphoma (HCC), Obesity, Sleep apnea, Stroke (HCC), and SVT (supraventricular tachycardia). here with     ICD-10-CM   1. OSA on CPAP  G47.33 Home sleep test    For home use only DME continuous positive airway pressure (CPAP)      [name] is doing fairly well on CPAP therapy. Compliance report reveals excellent compliance. She was encouraged to continue using CPAP nightly and for greater than 4 hours each night. AHI remains elevated but has improved. Leak is elevated. I have encouraged her to try to sleep on her side when she can. We will order HST then plan to order new CPAP pending results. Risks of untreated sleep apnea review and education materials provided. Healthy lifestyle habits encouraged. She will follow up in 31-90 days following set up of new machine. She verbalizes understanding  and agreement with this plan.    Orders Placed This Encounter  Procedures   For home use only DME continuous positive airway pressure (CPAP)    Heated Humidity with all supplies as needed    Length of Need:   Lifetime    Patient has OSA or probable OSA:   Yes    Is the patient currently using CPAP in the home:   Yes    Settings:   Other see comments    CPAP supplies needed:   Mask, headgear, cushions, filters, heated tubing and water chamber   Home sleep test    Please call Ronette Muskrat (Daughter) [phone] (Mobile)    Standing Status:   Future    Expiration Date:   06/07/2025    Where should this test be performed::   [facility]      No orders of the defined types were placed in this encounter.      Greig Forbes, FNP-C 06/13/2024, 9:29 AM [facility] [address], [address] [place], [place] [address] [phone]

## 2024-06-07 NOTE — Patient Instructions (Addendum)

## 2024-06-08 DIAGNOSIS — H26493 Other secondary cataract, bilateral: Secondary | ICD-10-CM | POA: Diagnosis not present

## 2024-06-08 DIAGNOSIS — H40023 Open angle with borderline findings, high risk, bilateral: Secondary | ICD-10-CM | POA: Diagnosis not present

## 2024-06-08 DIAGNOSIS — H35371 Puckering of macula, right eye: Secondary | ICD-10-CM | POA: Diagnosis not present

## 2024-06-08 DIAGNOSIS — H524 Presbyopia: Secondary | ICD-10-CM | POA: Diagnosis not present

## 2024-06-08 DIAGNOSIS — H353111 Nonexudative age-related macular degeneration, right eye, early dry stage: Secondary | ICD-10-CM | POA: Diagnosis not present

## 2024-06-12 ENCOUNTER — Telehealth: Payer: Self-pay | Admitting: *Deleted

## 2024-06-12 NOTE — Telephone Encounter (Signed)
 Left message on primary # to bring cpap machine along with power cord to visit on 06/13/24. Most recent data was showing July 2025

## 2024-06-13 ENCOUNTER — Encounter: Payer: Self-pay | Admitting: Family Medicine

## 2024-06-13 ENCOUNTER — Ambulatory Visit: Admitting: Family Medicine

## 2024-06-13 VITALS — BP 115/53 | HR 77 | Ht 64.0 in | Wt 276.0 lb

## 2024-06-13 DIAGNOSIS — G4733 Obstructive sleep apnea (adult) (pediatric): Secondary | ICD-10-CM

## 2024-06-13 NOTE — Progress Notes (Signed)
 SABRA

## 2024-06-18 ENCOUNTER — Other Ambulatory Visit: Payer: Self-pay | Admitting: Pulmonary Disease

## 2024-06-22 ENCOUNTER — Telehealth: Payer: Self-pay

## 2024-06-22 DIAGNOSIS — J4540 Moderate persistent asthma, uncomplicated: Secondary | ICD-10-CM

## 2024-06-22 NOTE — Telephone Encounter (Signed)
 Patient enrolled into asthma grant through [employer] Amount: $2000 Award Period: 12/25/2023 - 06/22/2025 ID: [id number] BIN: [id number] PCN: [id number] Group: [id number] For pharmacy inquiries, contact PDMI at [phone]. For patient inquiries, contact PAF at [phone].  Patient can fill with [facility] after 08/02/2024. She should continue filling Nucala  through [facility] through end of 2025 calendar year    I spoke with patient's daugher Garrison and advise of this. She is ware that we will not reach out until 2026 for filling Nucala  through [facility]  Sherry Pennant, PharmD, MPH, BCPS, CPP Clinical Pharmacist [facility])

## 2024-06-26 ENCOUNTER — Telehealth: Payer: Self-pay | Admitting: Family Medicine

## 2024-06-26 NOTE — Telephone Encounter (Signed)
 HST- HTA pending

## 2024-07-03 NOTE — Telephone Encounter (Signed)
 HST HTA shara: [id number] (exp. 06/26/24 to 09/24/24)

## 2024-08-01 NOTE — Telephone Encounter (Signed)
 Submitted a Prior Authorization request to [facility] [employer]/RX [employer]VANCE for NUCALA  via CoverMyMeds. Will update once we receive a response.  Key: [id number]

## 2024-08-01 NOTE — Telephone Encounter (Signed)
 Referral to pharmacotherapy clinic placed.

## 2024-08-03 ENCOUNTER — Ambulatory Visit: Attending: Pulmonary Disease

## 2024-08-03 ENCOUNTER — Other Ambulatory Visit (HOSPITAL_COMMUNITY): Payer: Self-pay

## 2024-08-03 DIAGNOSIS — J4541 Moderate persistent asthma with (acute) exacerbation: Secondary | ICD-10-CM

## 2024-08-03 DIAGNOSIS — J4540 Moderate persistent asthma, uncomplicated: Secondary | ICD-10-CM

## 2024-08-03 MED ORDER — NUCALA 100 MG/ML ~~LOC~~ SOAJ
100.0000 mg | SUBCUTANEOUS | 5 refills | Status: AC
  Filled 2024-08-21: qty 1, 28d supply, fill #0

## 2024-08-03 NOTE — Progress Notes (Signed)
 [facility]  Referring Provider: Dr. Annella  Virtual Visit via Telephone Note  I connected with caretaker/daughter of [name] on 08/03/2024 at  2:30 PM EST by telephone and verified that I am speaking with the correct person using two identifiers.  Location: Caregiver/daughter: home Provider: office   I discussed the limitations, risks, security and privacy concerns of performing an evaluation and management service by telephone and the availability of in person appointments. I also discussed with the patient that there may be a patient responsible charge related to this service. The patient expressed understanding and agreed to proceed.  HPI: [name] is a 84 y.o. female whose caregiver/daughter presents to the pharmacotherapy clinic via telephone for Nucala  follow-up counseling. PMH includes persistent asthma. Previously received Nucala  through PAP in 2025, now receiving through grant in 2026. Purpose of call is for care coordination as pharmacy is changing in 2026 and she will receive Nucala  through [facility]   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) 06/14/2018   Moderate persistent asthma 06/14/2018   OSA on CPAP 05/23/2018   Sleeps in sitting position due to orthopnea 05/23/2018   Right pontine stroke (HCC) 05/23/2018   Peptic ulcer with perforation  (HCC) 01/31/2017   Chronic peptic ulcer with perforation (HCC) 01/31/2017   Anxiety disorder 02/01/2013   Brainstem infarction (HCC) 11/05/2011   Stroke (HCC) 07/20/2011    Class: Acute   Nausea and vomiting 07/18/2011   Lateral nystagmus 07/18/2011    Class: Acute   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HCC) 07/18/2011    Class: History of   Hypertension 07/18/2011    Class: History of   Obesity 07/18/2011    Class: Chronic   SVT (supraventricular tachycardia) 07/18/2011    Class: History of   Depression 07/18/2011    Class: History of   History of hypokalemia 07/18/2011    Class: History of   Back pain, chronic 07/18/2011    Class: Chronic   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    Class: History of   Lymphoma (HCC) 05/13/2011   Essential hypertension 07/20/2010   Allergic rhinitis 07/17/2009   Hyperlipidemia 07/17/2009   Disorder of bone and cartilage 04/29/2008    Patient's Medications  New Prescriptions   No medications on file  Previous Medications   ACETAMINOPHEN  (TYLENOL ) 500 MG TABLET    Take 1,000 mg by mouth 2 (two) times daily.   ALBUTEROL  (VENTOLIN  HFA) 108 (90 BASE) MCG/ACT INHALER    TAKE 2 PUFFS BY MOUTH EVERY 6 HOURS AS NEEDED FOR WHEEZE OR SHORTNESS OF BREATH   AMLODIPINE  (NORVASC ) 5 MG TABLET    Take 5 mg by mouth daily.   BENZONATATE  (TESSALON ) 100 MG CAPSULE    Take by mouth 3 (three) times daily as needed for cough.   BUDESONIDE  (PULMICORT ) 0.5 MG/2ML NEBULIZER SOLUTION    TAKE 2 MLS (0.5 MG TOTAL) BY NEBULIZATION IN THE MORNING AND AT BEDTIME. DX  J45.40   DULOXETINE  (CYMBALTA ) 60 MG CAPSULE    Take 60 mg by mouth daily.   EPINEPHRINE  (EPIPEN  2-PAK) 0.3 MG/0.3 ML IJ SOAJ INJECTION    Inject 0.3 mg into the muscle as needed for anaphylaxis. Call 9-1-1 after use   FORMOTEROL  (PERFOROMIST ) 20 MCG/2ML NEBULIZER SOLUTION    Substituted for: Perforomist  Solution Inhale one vial in nebulizer twice a day.   FUROSEMIDE  (LASIX ) 40 MG TABLET    Take 40 mg by mouth  daily as needed (for fluid retention.).    HYDROCHLOROTHIAZIDE  (MICROZIDE ) 12.5 MG CAPSULE    Take 12.5 mg by mouth daily.   MEPOLIZUMAB  (NUCALA ) 100 MG/ML SOAJ    Inject 1 mL (100 mg total) into the skin every 28 (twenty-eight) days.   METOPROLOL  TARTRATE (LOPRESSOR ) 50 MG TABLET    Take 50 mg by mouth 2 (two) times daily.   MONTELUKAST  (SINGULAIR ) 10 MG TABLET    Take 10 mg by mouth daily.   MULTIPLE VITAMIN (MULTIVITAMIN WITH MINERALS) TABS TABLET    Take 1 tablet by mouth daily.   NEBULIZERS (SIDESTREAM NEBULIZER-DISP) MISC    Use with nebulized medications as directed. Replace tubing every 2 weeks.   OLMESARTAN  (BENICAR ) 40 MG TABLET    Take 40 mg by mouth daily.   ONDANSETRON  (ZOFRAN ) 4 MG TABLET    Take 1 tablet (4 mg total) by mouth every 8 (eight) hours as needed for nausea or vomiting.   PANTOPRAZOLE  (PROTONIX ) 40 MG TABLET       POTASSIUM CHLORIDE  SA (K-DUR,KLOR-CON ) 20 MEQ TABLET    Take 20 mEq by mouth daily as needed (With lasix ).   PRAVASTATIN (PRAVACHOL) 20 MG TABLET    Take 20 mg by mouth daily.   PRESCRIPTION MEDICATION    Inhale into the lungs at bedtime. CPAP   TRAMADOL  (ULTRAM ) 50 MG TABLET    TAKE 1 TABLET BY MOUTH UP TO 3 TIMES A DAY AS NEEDED FOR MODERATE PAIN Oral TID, PRN for 90 days   VITAMIN D, ERGOCALCIFEROL, (DRISDOL) 50000 UNITS CAPS    Take 50,000 Units by mouth every Monday.   Modified Medications   No medications on file  Discontinued Medications   No medications on file    Allergies: Allergies[1]  Past Medical History: Past Medical History:  Diagnosis Date   Anxiety    Arthritis    Asthma    hx of   Back pain    Chronic constipation    Depression    hx of   Dyspnea    Family history of adverse reaction to anesthesia    DAD bp ELEVATED    GERD (gastroesophageal reflux disease)    High cholesterol    Hypertension    Lymphoma (HCC)    Obesity    Sleep apnea    uses a CPAP   Stroke (HCC)    2010   SVT (supraventricular tachycardia)      Social History: Social History   Socioeconomic History   Marital status: Widowed    Spouse name: Not on file   Number of children: 5   Years of education: Not on file   Highest education level: Not on file  Occupational History   Occupation: building surveyor  Tobacco Use   Smoking status: Never   Smokeless tobacco: Never  Vaping Use   Vaping status: Never Used  Substance and Sexual Activity   Alcohol  use: No   Drug use: No   Sexual activity: Not Currently  Other Topics  Concern   Not on file  Social History Narrative   Not on file   Social Drivers of Health   Tobacco Use: Low Risk (06/13/2024)   Patient History    Smoking Tobacco Use: Never    Smokeless Tobacco Use: Never    Passive Exposure: Not on file  Financial Resource Strain: Not on file  Food Insecurity: Not on file  Transportation Needs: Not on file  Physical Activity: Not on file  Stress: Not on file  Social Connections: Not on file  Depression (EYV7-0): Not on file  Alcohol  Screen: Not on file  Housing: Not on file  Utilities: Not on file  Health Literacy: Not on file    Medication: Nucala  100mg /mL [place] every 28 days - CONTINUATION OF THERAPY  Patient enrolled into asthma grant through [employer] Amount: $2000 Award Period: 12/25/2023 - 06/22/2025 ID: [id number] BIN: [id number] PCN: [id number] Group: [id number] For pharmacy inquiries, contact PDMI at [phone]. For patient inquiries, contact PAF at [phone].  Patient dose will be Nucala  100 mg every 28 days.  Daughter reports she has one pen remaining for next dose which is due on 08/08/24.  Plan: - CONTINUE Nucala  100mg  [place] every 28 days - Rx triaged to [facility]. Provided contact information for [facility]: [phone] - Continue remaining respiratory regimen. - Follow-up with Dr. Annella as planned in March 2026  I discussed the assessment and treatment plan with the patient. The patient was provided an opportunity to ask  questions and all were answered. The patient agreed with the plan and demonstrated an understanding of the instructions.   The patient was advised to call back or seek an in-person evaluation if the symptoms worsen or if the condition fails to improve as anticipated.  I provided 10 minutes of non-face-to-face time during this encounter.  Aleck Puls, PharmD, BCPS, CPP Clinical Pharmacist   Pulmonary Clinic  [facility]    [1] No Known Allergies

## 2024-08-03 NOTE — Telephone Encounter (Signed)
 Test claim processed successfully for $1323.04

## 2024-08-03 NOTE — Telephone Encounter (Signed)
 Received notification from [employer] regarding a prior authorization for NUCALA . Authorization has been APPROVED from 08/01/24 to 08/01/25. Approval letter sent to scan center.  Authorization # [id number]  Sherry Pennant, PharmD, MPH, BCPS, CPP Clinical Pharmacist

## 2024-08-15 ENCOUNTER — Other Ambulatory Visit: Payer: Self-pay | Admitting: Pulmonary Disease

## 2024-08-15 DIAGNOSIS — J4541 Moderate persistent asthma with (acute) exacerbation: Secondary | ICD-10-CM

## 2024-08-15 NOTE — Telephone Encounter (Unsigned)
 Copied from CRM [id number]. Topic: Clinical - Medication Refill >> Aug 15, 2024  4:37 PM Dedra B wrote: Medication: formoterol  (PERFOROMIST ) 20 MCG/2ML nebulizer solution  Has the patient contacted their pharmacy? Yes, need new prescription   This is the patient's preferred pharmacy:  [id number] - [place], [place] - [address] [place] [address] Phone: [phone] Fax: [phone]   Is this the correct pharmacy for this prescription? Yes  Has the prescription been filled recently? No  Is the patient out of the medication? Yes  Has the patient been seen for an appointment in the last year OR does the patient have an upcoming appointment? Yes  Can we respond through MyChart? Yes  Agent: Please be advised that Rx refills may take up to 3 business days. We ask that you follow-up with your pharmacy.

## 2024-08-17 MED ORDER — FORMOTEROL FUMARATE 20 MCG/2ML IN NEBU: 20.0000 ug | INHALATION_SOLUTION | Freq: Two times a day (BID) | RESPIRATORY_TRACT | 11 refills | Status: AC

## 2024-08-17 NOTE — Telephone Encounter (Signed)
 Copied from CRM [id number]. Topic: Clinical - Medication Refill >> Aug 17, 2024 11:03 AM Russell PARAS wrote: Medication: formoterol  (PERFOROMIST ) 20 MCG/2ML nebulizer solution  Has the patient contacted their pharmacy? Yes (Agent: If no, request that the patient contact the pharmacy for the refill. If patient does not wish to contact the pharmacy document the reason why and proceed with request.) (Agent: If yes, when and what did the pharmacy advise?)  This is the patient's preferred pharmacy:  [id number] - [place], [place] - [address] [place] [address] Phone: [phone] Fax: [phone]   Is this the correct pharmacy for this prescription? Yes If no, delete pharmacy and type the correct one.   Has the prescription been filled recently? Yes  Is the patient out of the medication? Yes, has been out for 2 days  Has the patient been seen for an appointment in the last year OR does the patient have an upcoming appointment? Yes  Can we respond through MyChart? Yes  Agent: Please be advised that Rx refills may take up to 3 business days. We ask that you follow-up with your pharmacy.   Rx already refilled.

## 2024-08-21 ENCOUNTER — Other Ambulatory Visit (HOSPITAL_COMMUNITY): Payer: Self-pay

## 2024-08-21 ENCOUNTER — Other Ambulatory Visit: Payer: Self-pay

## 2024-08-21 NOTE — Progress Notes (Signed)
 Specialty Pharmacy Initial Fill Coordination Note  [name] is a 84 y.o. female contacted today regarding initial fill of specialty medication(s) Mepolizumab  (Nucala )   Patient requested Delivery   Delivery date: 08/23/24   Verified address: [address], [place], [place] [address] (This is daughter's address, different from the one on file)   Medication will be filled on: 08/22/24   Patient is aware of $0 copayment.    **Please mail welcome packet and call pt for refills**

## 2024-08-22 ENCOUNTER — Other Ambulatory Visit: Payer: Self-pay

## 2024-08-22 NOTE — Telephone Encounter (Unsigned)
 Copied from CRM [id number]. Topic: Clinical - Prescription Issue >> Aug 20, 2024  3:58 PM Joesph PARAS wrote: Reason for CRM: Patient states she has attempted to contact pharmacy several times and pharmacy has yet to contact her back. Patient states we must call the pharmacy for her to proceed with a refill of her formoterol  (PERFOROMIST ) 20 MCG/2ML nebulizer solution. Patient would like to know if there are potential side effects since she has been out of this medication for five days and would be restarting it.   Patient has extra questions about the EPIPEN  (regarding reactions after extended periods of not using it) and seems confused between medications and what they do. Please return call to patient. Patient requesting response today, informed that PAS cannot guarantee that, as it is less than an hour til clinic closing.
